# Patient Record
Sex: Male | Born: 1950 | Race: White | Hispanic: No | Marital: Married | Smoking: Current some day smoker
Health system: Southern US, Community
[De-identification: ages and names within clinical notes are randomized; demographics above are authoritative.]

## PROBLEM LIST (undated history)

## (undated) DIAGNOSIS — IMO0002 Reserved for concepts with insufficient information to code with codable children: Secondary | ICD-10-CM

## (undated) DIAGNOSIS — R7989 Other specified abnormal findings of blood chemistry: Secondary | ICD-10-CM

## (undated) DIAGNOSIS — M1990 Unspecified osteoarthritis, unspecified site: Secondary | ICD-10-CM

## (undated) DIAGNOSIS — F419 Anxiety disorder, unspecified: Secondary | ICD-10-CM

## (undated) DIAGNOSIS — K219 Gastro-esophageal reflux disease without esophagitis: Secondary | ICD-10-CM

## (undated) DIAGNOSIS — F329 Major depressive disorder, single episode, unspecified: Secondary | ICD-10-CM

## (undated) DIAGNOSIS — I10 Essential (primary) hypertension: Secondary | ICD-10-CM

## (undated) DIAGNOSIS — I499 Cardiac arrhythmia, unspecified: Secondary | ICD-10-CM

## (undated) DIAGNOSIS — F32A Depression, unspecified: Secondary | ICD-10-CM

## (undated) DIAGNOSIS — E119 Type 2 diabetes mellitus without complications: Secondary | ICD-10-CM

## (undated) DIAGNOSIS — E785 Hyperlipidemia, unspecified: Secondary | ICD-10-CM

## (undated) DIAGNOSIS — R945 Abnormal results of liver function studies: Secondary | ICD-10-CM

## (undated) HISTORY — DX: Unspecified osteoarthritis, unspecified site: M19.90

## (undated) HISTORY — DX: Major depressive disorder, single episode, unspecified: F32.9

## (undated) HISTORY — DX: Other specified abnormal findings of blood chemistry: R79.89

## (undated) HISTORY — DX: Gastro-esophageal reflux disease without esophagitis: K21.9

## (undated) HISTORY — DX: Hyperlipidemia, unspecified: E78.5

## (undated) HISTORY — PX: KNEE SURGERY: SHX244

## (undated) HISTORY — PX: EYE SURGERY: SHX253

## (undated) HISTORY — DX: Type 2 diabetes mellitus without complications: E11.9

## (undated) HISTORY — PX: COLONOSCOPY: SHX174

## (undated) HISTORY — DX: Essential (primary) hypertension: I10

## (undated) HISTORY — DX: Reserved for concepts with insufficient information to code with codable children: IMO0002

## (undated) HISTORY — PX: SQUAMOUS CELL CARCINOMA EXCISION: SHX2433

## (undated) HISTORY — DX: Depression, unspecified: F32.A

## (undated) HISTORY — PX: BLADDER SURGERY: SHX569

## (undated) HISTORY — DX: Abnormal results of liver function studies: R94.5

---

## 1991-07-16 HISTORY — PX: KNEE ARTHROSCOPY: SUR90

## 1997-07-15 HISTORY — PX: ACHILLES TENDON REPAIR: SUR1153

## 1997-12-03 ENCOUNTER — Emergency Department (HOSPITAL_COMMUNITY): Admission: EM | Admit: 1997-12-03 | Discharge: 1997-12-03 | Payer: Self-pay | Admitting: Emergency Medicine

## 2001-07-15 ENCOUNTER — Encounter: Payer: Self-pay | Admitting: Internal Medicine

## 2001-07-15 LAB — CONVERTED CEMR LAB

## 2004-06-18 ENCOUNTER — Ambulatory Visit: Payer: Self-pay | Admitting: Internal Medicine

## 2004-06-25 ENCOUNTER — Ambulatory Visit: Payer: Self-pay | Admitting: Internal Medicine

## 2004-10-18 ENCOUNTER — Ambulatory Visit: Payer: Self-pay | Admitting: Internal Medicine

## 2004-10-24 ENCOUNTER — Ambulatory Visit: Payer: Self-pay | Admitting: Internal Medicine

## 2005-03-14 ENCOUNTER — Ambulatory Visit: Payer: Self-pay | Admitting: Internal Medicine

## 2005-03-15 ENCOUNTER — Ambulatory Visit: Payer: Self-pay | Admitting: Internal Medicine

## 2005-08-07 ENCOUNTER — Ambulatory Visit: Payer: Self-pay | Admitting: Internal Medicine

## 2005-12-26 ENCOUNTER — Ambulatory Visit: Payer: Self-pay | Admitting: Internal Medicine

## 2006-02-10 ENCOUNTER — Ambulatory Visit: Payer: Self-pay | Admitting: Internal Medicine

## 2006-05-26 ENCOUNTER — Ambulatory Visit: Payer: Self-pay | Admitting: Internal Medicine

## 2006-08-11 ENCOUNTER — Ambulatory Visit: Payer: Self-pay | Admitting: Internal Medicine

## 2006-11-24 ENCOUNTER — Ambulatory Visit: Payer: Self-pay | Admitting: Internal Medicine

## 2007-02-02 ENCOUNTER — Encounter: Payer: Self-pay | Admitting: Internal Medicine

## 2007-02-02 DIAGNOSIS — R7309 Other abnormal glucose: Secondary | ICD-10-CM

## 2007-02-02 DIAGNOSIS — R740 Nonspecific elevation of levels of transaminase and lactic acid dehydrogenase [LDH]: Secondary | ICD-10-CM

## 2007-03-05 ENCOUNTER — Ambulatory Visit: Payer: Self-pay | Admitting: Gastroenterology

## 2007-03-18 ENCOUNTER — Ambulatory Visit: Payer: Self-pay | Admitting: Gastroenterology

## 2007-03-18 ENCOUNTER — Encounter: Payer: Self-pay | Admitting: Internal Medicine

## 2007-06-25 ENCOUNTER — Ambulatory Visit: Payer: Self-pay | Admitting: Internal Medicine

## 2007-06-25 DIAGNOSIS — J069 Acute upper respiratory infection, unspecified: Secondary | ICD-10-CM | POA: Insufficient documentation

## 2007-07-16 LAB — HM COLONOSCOPY: HM Colonoscopy: NORMAL

## 2007-12-22 ENCOUNTER — Encounter: Payer: Self-pay | Admitting: Internal Medicine

## 2007-12-28 ENCOUNTER — Telehealth: Payer: Self-pay | Admitting: Internal Medicine

## 2008-01-14 ENCOUNTER — Ambulatory Visit: Payer: Self-pay | Admitting: Internal Medicine

## 2008-01-14 LAB — CONVERTED CEMR LAB
Albumin: 4.2 g/dL (ref 3.5–5.2)
BUN: 12 mg/dL (ref 6–23)
Calcium: 9.6 mg/dL (ref 8.4–10.5)
Cholesterol: 222 mg/dL (ref 0–200)
Eosinophils Relative: 6.9 % — ABNORMAL HIGH (ref 0.0–5.0)
GFR calc Af Amer: 80 mL/min
Glucose, Bld: 158 mg/dL — ABNORMAL HIGH (ref 70–99)
HCT: 47.4 % (ref 39.0–52.0)
Hemoglobin: 16.3 g/dL (ref 13.0–17.0)
Monocytes Absolute: 0.5 10*3/uL (ref 0.1–1.0)
Monocytes Relative: 9.8 % (ref 3.0–12.0)
Neutro Abs: 2.9 10*3/uL (ref 1.4–7.7)
PSA: 0.45 ng/mL (ref 0.10–4.00)
RDW: 12.4 % (ref 11.5–14.6)
Specific Gravity, Urine: >=1.03
TSH: 3.53 microintl units/mL (ref 0.35–5.50)
Total CHOL/HDL Ratio: 3.1
Total Protein: 6.9 g/dL (ref 6.0–8.3)
Urobilinogen, UA: 0.2
WBC: 5.5 10*3/uL (ref 4.5–10.5)

## 2008-01-20 ENCOUNTER — Ambulatory Visit: Payer: Self-pay | Admitting: Internal Medicine

## 2008-01-20 DIAGNOSIS — I10 Essential (primary) hypertension: Secondary | ICD-10-CM | POA: Insufficient documentation

## 2008-01-20 DIAGNOSIS — F329 Major depressive disorder, single episode, unspecified: Secondary | ICD-10-CM | POA: Insufficient documentation

## 2008-01-22 ENCOUNTER — Telehealth: Payer: Self-pay | Admitting: Internal Medicine

## 2008-02-03 ENCOUNTER — Encounter: Admission: RE | Admit: 2008-02-03 | Discharge: 2008-04-05 | Payer: Self-pay | Admitting: Internal Medicine

## 2008-02-03 ENCOUNTER — Encounter: Payer: Self-pay | Admitting: Internal Medicine

## 2008-10-24 ENCOUNTER — Telehealth (INDEPENDENT_AMBULATORY_CARE_PROVIDER_SITE_OTHER): Payer: Self-pay | Admitting: *Deleted

## 2008-10-24 ENCOUNTER — Ambulatory Visit: Payer: Self-pay | Admitting: Family Medicine

## 2008-10-24 LAB — CONVERTED CEMR LAB
BUN: 14 mg/dL (ref 6–23)
Creatinine, Ser: 1.2 mg/dL (ref 0.4–1.5)
GFR calc non Af Amer: 65.79 mL/min (ref >60)
Hgb A1c MFr Bld: 6.4 % (ref 4.6–6.5)

## 2008-10-25 ENCOUNTER — Encounter (INDEPENDENT_AMBULATORY_CARE_PROVIDER_SITE_OTHER): Payer: Self-pay | Admitting: *Deleted

## 2008-10-26 ENCOUNTER — Telehealth (INDEPENDENT_AMBULATORY_CARE_PROVIDER_SITE_OTHER): Payer: Self-pay | Admitting: *Deleted

## 2008-12-05 ENCOUNTER — Telehealth: Payer: Self-pay | Admitting: Family Medicine

## 2008-12-23 ENCOUNTER — Telehealth: Payer: Self-pay | Admitting: Family Medicine

## 2008-12-23 ENCOUNTER — Ambulatory Visit: Payer: Self-pay | Admitting: Family Medicine

## 2009-02-06 ENCOUNTER — Ambulatory Visit: Payer: Self-pay | Admitting: Family Medicine

## 2009-02-06 DIAGNOSIS — Z87448 Personal history of other diseases of urinary system: Secondary | ICD-10-CM

## 2009-08-23 ENCOUNTER — Telehealth (INDEPENDENT_AMBULATORY_CARE_PROVIDER_SITE_OTHER): Payer: Self-pay | Admitting: *Deleted

## 2009-08-23 ENCOUNTER — Encounter (INDEPENDENT_AMBULATORY_CARE_PROVIDER_SITE_OTHER): Payer: Self-pay | Admitting: *Deleted

## 2009-10-02 ENCOUNTER — Telehealth (INDEPENDENT_AMBULATORY_CARE_PROVIDER_SITE_OTHER): Payer: Self-pay | Admitting: *Deleted

## 2009-10-03 ENCOUNTER — Ambulatory Visit: Payer: Self-pay | Admitting: Family Medicine

## 2009-10-03 ENCOUNTER — Encounter (INDEPENDENT_AMBULATORY_CARE_PROVIDER_SITE_OTHER): Payer: Self-pay | Admitting: *Deleted

## 2009-10-03 DIAGNOSIS — G5600 Carpal tunnel syndrome, unspecified upper limb: Secondary | ICD-10-CM

## 2009-10-03 LAB — CONVERTED CEMR LAB
Blood in Urine, dipstick: NEGATIVE
Nitrite: NEGATIVE
Specific Gravity, Urine: 1.025
WBC Urine, dipstick: NEGATIVE

## 2009-10-04 ENCOUNTER — Encounter: Payer: Self-pay | Admitting: Family Medicine

## 2009-10-04 ENCOUNTER — Encounter (INDEPENDENT_AMBULATORY_CARE_PROVIDER_SITE_OTHER): Payer: Self-pay | Admitting: *Deleted

## 2009-10-09 ENCOUNTER — Ambulatory Visit: Payer: Self-pay | Admitting: Family Medicine

## 2009-10-17 ENCOUNTER — Telehealth (INDEPENDENT_AMBULATORY_CARE_PROVIDER_SITE_OTHER): Payer: Self-pay | Admitting: *Deleted

## 2009-10-17 ENCOUNTER — Ambulatory Visit: Payer: Self-pay | Admitting: Family Medicine

## 2009-10-17 LAB — CONVERTED CEMR LAB
BUN: 14 mg/dL (ref 6–23)
Basophils Relative: 1.2 % (ref 0.0–3.0)
Calcium: 9 mg/dL (ref 8.4–10.5)
Cholesterol: 200 mg/dL (ref 0–200)
Creatinine, Ser: 1.2 mg/dL (ref 0.4–1.5)
Eosinophils Absolute: 0.4 10*3/uL (ref 0.0–0.7)
GFR calc non Af Amer: 65.57 mL/min (ref >60)
HCT: 45.7 % (ref 39.0–52.0)
HDL: 68.4 mg/dL (ref >39.00)
LDL Cholesterol: 118 mg/dL — ABNORMAL HIGH (ref 0–99)
Lymphs Abs: 2 10*3/uL (ref 0.7–4.0)
MCHC: 34.8 g/dL (ref 30.0–36.0)
MCV: 94.2 fL (ref 78.0–100.0)
Monocytes Absolute: 0.7 10*3/uL (ref 0.1–1.0)
Neutro Abs: 4 10*3/uL (ref 1.4–7.7)
Neutrophils Relative %: 55.8 % (ref 43.0–77.0)
Potassium: 4.1 meq/L (ref 3.5–5.1)
RBC: 4.84 M/uL (ref 4.22–5.81)
Total Bilirubin: 0.6 mg/dL (ref 0.3–1.2)
Triglycerides: 70 mg/dL (ref 0.0–149.0)
VLDL: 14 mg/dL (ref 0.0–40.0)

## 2009-10-20 ENCOUNTER — Ambulatory Visit: Payer: Self-pay | Admitting: Family Medicine

## 2009-10-20 ENCOUNTER — Telehealth (INDEPENDENT_AMBULATORY_CARE_PROVIDER_SITE_OTHER): Payer: Self-pay | Admitting: *Deleted

## 2009-10-20 DIAGNOSIS — E1165 Type 2 diabetes mellitus with hyperglycemia: Secondary | ICD-10-CM | POA: Insufficient documentation

## 2009-10-20 DIAGNOSIS — E1151 Type 2 diabetes mellitus with diabetic peripheral angiopathy without gangrene: Secondary | ICD-10-CM

## 2009-10-24 ENCOUNTER — Ambulatory Visit: Payer: Self-pay | Admitting: Family Medicine

## 2010-01-01 ENCOUNTER — Encounter: Payer: Self-pay | Admitting: Family Medicine

## 2010-01-23 ENCOUNTER — Ambulatory Visit: Payer: Self-pay | Admitting: Family Medicine

## 2010-06-22 ENCOUNTER — Ambulatory Visit: Payer: Self-pay | Admitting: Family Medicine

## 2010-07-11 ENCOUNTER — Encounter: Payer: Self-pay | Admitting: Family Medicine

## 2010-07-17 ENCOUNTER — Telehealth: Payer: Self-pay | Admitting: Family Medicine

## 2010-08-14 NOTE — Letter (Signed)
Summary: [facility]: Immunoassay Fecal Occult Blood (iFOB) Order Form  [facility] [place]  [address] [place], [place] [address]   Phone: [phone]  Fax: [phone]       Lab: Immunoassay Fecal Occult Blood (iFOB) Order Form   October 03, 2009 MRN: [id number]   Charles Trujillo 06/15/1951   Physicican Name:____Yvonne,Lowne,DO_____________________  Diagnosis Code:_______V76.51___________________      [name] CMA

## 2010-08-14 NOTE — Progress Notes (Signed)
Summary: Refill request   Phone Note Refill Request Message from:  Pharmacy on August 23, 2009 10:13 AM  Refills Requested: Medication #1:  CYMBALTA 60 MG CPEP 1 by mouth once daily [BMN]   Dosage confirmed as above?Dosage Confirmed  Medication #2:  ZESTORETIC 10-12.5 MG TABS 1 by mouth once daily   Dosage confirmed as above?Dosage Confirmed   Brand Name Necessary? No Refill request to [facility] , [facility]  Initial call taken by: Michaelle Copas,  August 23, 2009 10:14 AM    New/Updated Medications: CYMBALTA 60 MG CPEP (DULOXETINE HCL) 1 by mouth once daily. NEEDS OFFICE VISIT. [BMN] ZESTORETIC 10-12.5 MG TABS (LISINOPRIL-HYDROCHLOROTHIAZIDE) 1 by mouth once daily. NEEDS OFFICE VISIT. Prescriptions: ZESTORETIC 10-12.5 MG TABS (LISINOPRIL-HYDROCHLOROTHIAZIDE) 1 by mouth once daily. NEEDS OFFICE VISIT.  #30 x 0   Entered by:   [name] CMA   Authorized by:   Loreen Freud DO   Signed by:   [name] CMA on 08/23/2009   Method used:   Electronically to        [facility]* (retail)       [address]       [place], [place]  [address]       Ph: [phone]       Fax: [phone]   RxID:   [id number] CYMBALTA 60 MG CPEP (DULOXETINE HCL) 1 by mouth once daily. NEEDS OFFICE VISIT. Brand medically necessary #30 x 0   Entered by:   [name] CMA   Authorized by:   Loreen Freud DO   Signed by:   [name] CMA on 08/23/2009   Method used:   Electronically to        [facility]* (retail)       [address]       [place], [place]  [address]       Ph: [phone]       Fax: [phone]   RxID:   [id number]

## 2010-08-14 NOTE — Letter (Signed)
Summary: Primary Care Appointment Letter  [facility] [place]  [address] [place], [place] [address]   Phone: [phone]  Fax: [phone]    08/23/2009 MRN: [id number]  [facility] [address] [place], [place]  [address]  Dear Mr. Mood,   Your Primary Care Physician Loreen Freud DO has indicated that:    ____x___it is time to schedule an appointment.    _______you missed your appointment on______ and need to call and          reschedule.    _______you need to have lab work done.    _______you need to schedule an appointment discuss lab or test results.    _______you need to call to reschedule your appointment that is                       scheduled on _________.     Please call our office as soon as possible. Our phone number is [phone]          _________. Please press option 1. Our office is open 8a-12noon and 1p-5p, Monday through Friday.     Thank you,    [facility]

## 2010-08-14 NOTE — Progress Notes (Signed)
Summary: Lab Results   Phone Note Outgoing Call   Reason for Call: Discuss lab or test results Summary of Call: Pt blood sugar and a1c elevated----- pt is diabetic---needs ov to discuss glucometer and diet etc   Signed by Loreen Freud DO on 10/20/2009 at 3:37 PM  LMTCB Harold Barban  October 20, 2009 3:50 PM Initial call taken by: Harold Barban,  October 20, 2009 3:50 PM  Follow-up for Phone Call        Patient is coming in for an office visit on 4.12.11  Follow-up by: Harold Barban,  October 23, 2009 11:00 AM

## 2010-08-14 NOTE — Progress Notes (Signed)
Summary: Lab results  Phone Note Outgoing Call   Call placed by: [name] CMA,  October 17, 2009 4:39 PM Summary of Call: Regarding lab results, LMTCB:  glucose elevated---- recheck fasting with hgba1c 790.6 LDL goal ideally < 100----diet and exercise will help this---- recheck labs 3 months  790.6  272.4  lipid, hep, bmp, hgba1c Signed by Loreen Freud DO on 10/17/2009 at 3:25 PM  Follow-up for Phone Call        Patient called back and information was given. Patient is coming in 4.8.11 for his fasting HgbA1C and will call back later to schedule the 3 month lab work.  Follow-up by: Harold Barban,  October 18, 2009 10:58 AM

## 2010-08-14 NOTE — Assessment & Plan Note (Signed)
Summary: CPX,RENEW MEDS,UMR INS/RH.....   Vital Signs:  Patient profile:   60 year old male Weight:      206 pounds Pulse rate:   87 / minute Pulse rhythm:   regular BP sitting:   122 / 78  (left arm) Cuff size:   regular  Vitals Entered By: [name] CMA (October 03, 2009 8:25 AM) CC: Charles Trujillo here for CPX, everything going well.   History of Present Illness: Charles Trujillo here for cpe and labs.   No complaints.    Preventive Screening-Counseling & Management  Alcohol-Tobacco     Alcohol drinks/day: 2     Alcohol type: beer     Smoking Status: current     Smoking Cessation Counseling: yes     Year Quit: 1990--cigarettes      Cigars/week: 3  Caffeine-Diet-Exercise     Caffeine use/day: 4     Does Patient Exercise: yes     Type of exercise: walking  Hep-HIV-STD-Contraception     Dental Visit-last 6 months yes     Dental Care Counseling: not indicated; dental care within six months      Sexual History:  currently monogamous and married.    Current Medications (verified): 1)  Viagra 100 Mg  Tabs (Sildenafil Citrate) .... One Prn 2)  Cymbalta 60 Mg Cpep (Duloxetine Hcl) .Marland Kitchen.. 1 By Mouth Once Daily. Needs Office Visit. 3)  Zestoretic 10-12.5 Mg Tabs (Lisinopril-Hydrochlorothiazide) .Marland Kitchen.. 1 By Mouth Once Daily. Needs Office Visit. 4)  Abilify 2 Mg Tabs (Aripiprazole) .Marland Kitchen.. 1 By Mouth Once Daily  Allergies: 1)  Codeine Phosphate (Codeine Phosphate)  Past History:  Past Medical History: Last updated: 01/20/2008 increased LFT's, previously evaluated by Dr. 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 mother DM, HTN father-stroke 24 yo  Social History: Reviewed history from 01/20/2008 and no changes required. Retired Married Former Smoker Regular exercise-no Does Patient Exercise:  yes Caffeine use/day:  4 Smoking Status:  current Dental Care w/in 6 mos.:  yes Sexual History:  currently monogamous, married  Review of Systems      See HPI General:  Denies chills, fatigue, fever, loss of appetite, malaise, sleep disorder, sweats, weakness, and weight loss. Eyes:  Denies blurring, discharge, double vision, eye irritation, eye pain, halos, itching, light sensitivity, red eye, vision loss-1 eye, and vision loss-both eyes; optho--due. ENT:  Denies decreased hearing, difficulty swallowing, ear discharge, earache, hoarseness, nasal congestion, nosebleeds, postnasal drainage, ringing in ears, sinus pressure, and sore throat. CV:  Denies bluish discoloration of lips or nails, chest pain or discomfort, difficulty breathing at night, difficulty breathing while lying down, fainting, fatigue, leg cramps with exertion, lightheadness, near fainting, palpitations, shortness of breath with exertion, swelling of feet, swelling of hands, and weight gain. Resp:  Denies chest discomfort, chest pain with inspiration, cough, coughing up blood, excessive snoring, hypersomnolence, morning headaches, pleuritic, shortness of breath, sputum productive, and wheezing. GI:  Denies abdominal pain, bloody stools, change in bowel habits, constipation, dark tarry stools, diarrhea, excessive appetite, gas, hemorrhoids, indigestion, loss of appetite, nausea,  vomiting, vomiting blood, and yellowish skin color. GU:  Denies decreased libido, discharge, dysuria, erectile dysfunction, genital sores, hematuria, incontinence, nocturia,  urinary frequency, and urinary hesitancy. MS:  Denies joint pain, joint redness, joint swelling, loss of strength, low back pain, mid back pain, muscle aches, muscle , cramps, muscle weakness, stiffness, and thoracic pain; numbness in both hands at night. Derm:  Denies changes in color of skin, changes in nail beds, dryness, excessive perspiration, flushing, hair loss, insect bite(s), itching, lesion(s), poor wound healing, and rash. Neuro:  Complains of numbness; denies brief paralysis, difficulty with concentration, disturbances in coordination, falling down, headaches, inability to speak, memory loss, poor balance, seizures, sensation of room spinning, tingling, tremors, visual disturbances, and weakness. Psych:  Denies alternate hallucination ( auditory/visual), anxiety, depression, easily angered, easily tearful, irritability, mental problems, panic attacks, sense of great danger, suicidal thoughts/plans, thoughts of violence, unusual visions or sounds, and thoughts /plans of harming others. Endo:  Denies cold intolerance, excessive hunger, excessive thirst, excessive urination, heat intolerance, polyuria, and weight change. Heme:  Denies abnormal bruising, bleeding, enlarge lymph nodes, fevers, pallor, and skin discoloration. Allergy:  Denies hives or rash, itching eyes, persistent infections, seasonal allergies, and sneezing.  Physical Exam  General:  Well-developed,well-nourished,in no acute distress; alert,appropriate and cooperative throughout examination Head:  Normocephalic and atraumatic without obvious abnormalities. No apparent alopecia or balding. Eyes:  vision grossly intact, pupils equal, and pupils round.   Ears:  External ear exam shows no significant lesions or deformities.  Otoscopic examination reveals clear canals, tympanic membranes are intact bilaterally without bulging, retraction, inflammation or discharge. Hearing is grossly normal bilaterally. Nose:  External nasal  examination shows no deformity or inflammation. Nasal mucosa are pink and moist without lesions or exudates. Mouth:  Oral mucosa and oropharynx without lesions or exudates.  Teeth in good repair. Neck:  No deformities, masses, or tenderness noted. Chest Wall:  No deformities, masses, tenderness or gynecomastia noted. Lungs:  Normal respiratory effort, chest expands symmetrically. Lungs are clear to auscultation, no crackles or wheezes. Heart:  normal rate and no murmur.   Abdomen:  Bowel sounds positive,abdomen soft and non-tender without masses, organomegaly or hernias noted. Rectal:  No external abnormalities noted. Normal sphincter tone. No rectal masses or tenderness. heme negative brown stool Genitalia:  Testes bilaterally descended without nodularity, tenderness or masses. No scrotal masses or lesions. No penis lesions or urethral discharge. Prostate:  Prostate gland firm and smooth, no enlargement, nodularity, tenderness, mass, asymmetry or induration. Msk:  normal ROM, no joint tenderness, no joint swelling, no joint warmth, no redness over joints, no joint deformities, no joint instability, and no crepitation.   Pulses:  R posterior tibial normal, R dorsalis pedis normal, R carotid normal, L posterior tibial normal, L dorsalis pedis normal, and L carotid normal.   Extremities:  No clubbing, cyanosis, edema, or deformity noted with normal full range of motion of all joints.   Neurologic:  No cranial nerve deficits noted. Station and gait are normal. Plantar reflexes are down-going bilaterally. DTRs are symmetrical throughout. Sensory, motor and coordinative functions appear intact. + numbness in L hand with flexion of wrist Skin:  Intact without suspicious lesions or rashes mult SK Cervical Nodes:  No lymphadenopathy noted Psych:  Cognition and judgment appear intact. Alert and cooperative with normal attention span and concentration. No apparent delusions, illusions,  hallucinations   Impression & Recommendations:  Problem # 1:  PHYSICAL EXAMINATION (ICD-V70.0)  schedule labs ghm utd d/w Charles Trujillo shingels vaccine Reviewed  preventive care protocols, scheduled due services, and updated immunizations.  Orders: UA Dipstick w/o Micro (manual) (57322) EKG w/ Interpretation (93000)  Problem # 2:  HYPERTENSION (ICD-401.9) Assessment: Unchanged  His updated medication list for this problem includes:    Zestoretic 10-12.5 Mg Tabs (Lisinopril-hydrochlorothiazide) .Marland Kitchen... 1 by mouth once daily. needs office visit.  BP today: 122/78 Prior BP: 130/80 (02/06/2009)  Labs Reviewed: K+: 4.6 (10/24/2008) Creat: : 1.2 (10/24/2008)   Chol: 222 (01/14/2008)   HDL: 71.6 (01/14/2008)   LDL: DEL (01/14/2008)   TG: 74 (01/14/2008)  Orders: UA Dipstick w/o Micro (manual) (02542) EKG w/ Interpretation (93000)  Problem # 3:  HYPERGLYCEMIA (ICD-790.29)  Orders: UA Dipstick w/o Micro (manual) (70623) EKG w/ Interpretation (93000)  Labs Reviewed: Creat: 1.2 (10/24/2008)     Problem # 4:  CARPAL TUNNEL SYNDROME, BILATERAL (ICD-354.0)  Orders: Ankle / Wrist Splint (A4570) UA Dipstick w/o Micro (manual) (76283) EKG w/ Interpretation (93000)  Labs Reviewed: TSH: 3.53 (01/14/2008)   HgBA1c: 6.4 (10/24/2008)  Complete Medication List: 1)  Viagra 100 Mg Tabs (Sildenafil citrate) .... One prn 2)  Cymbalta 60 Mg Cpep (Duloxetine hcl) .Marland Kitchen.. 1 by mouth once daily. needs office visit. 3)  Zestoretic 10-12.5 Mg Tabs (Lisinopril-hydrochlorothiazide) .Marland Kitchen.. 1 by mouth once daily. needs office visit. 4)  Abilify 2 Mg Tabs (Aripiprazole) .Marland Kitchen.. 1 by mouth once daily  Patient Instructions: 1)  V70.0 , 790.6  cbcd, bmp, hep,lipid, psa, tsh, hgba1c iPrescriptions: VIAGRA 100 MG  TABS (SILDENAFIL CITRATE) one prn  #10 x 6   Entered and Authorized by:   Loreen Freud DO   Signed by:   Loreen Freud DO on 10/03/2009   Method used:   Electronically to        [facility]*  (retail)       [address]       [place], [place]  [address]       Ph: [phone]       Fax: [phone]   RxID:   [id number] ABILIFY 2 MG TABS (ARIPIPRAZOLE) 1 by mouth once daily  #30 x 5   Entered and Authorized by:   Loreen Freud DO   Signed by:   Loreen Freud DO on 10/03/2009   Method used:   Electronically to        [facility]* (retail)       [address]       [place], [place]  [address]       Ph: [phone]       Fax: [phone]   RxID:   [id number] ZESTORETIC 10-12.5 MG TABS (LISINOPRIL-HYDROCHLOROTHIAZIDE) 1 by mouth once daily. NEEDS OFFICE VISIT.  #30 x 5   Entered and Authorized by:   Loreen Freud DO   Signed by:   Loreen Freud DO on 10/03/2009   Method used:   Electronically to        [facility]* (retail)       [address]       [place], [place]  [address]       Ph: [phone]       Fax: [phone]   RxID:   [id number] CYMBALTA 60 MG CPEP (DULOXETINE HCL) 1 by mouth once daily. NEEDS OFFICE VISIT. Brand medically necessary #30 x 5   Entered and Authorized by:   Loreen Freud DO   Signed by:   Loreen Freud DO on 10/03/2009   Method used:   Electronically to  [facility]* (retail)       [address]       [place], [place]  [address]       Ph: [phone]       Fax: [phone]   RxID:   [id number]    EKG  Procedure date:  10/03/2009  Findings:      Normal sinus rhythm with rate of:  98 BPM     Immunization History:  Tetanus/Td Immunization History:    Tetanus/Td:  Tdap (06/21/2009)  Influenza Immunization History:    Influenza:  Fluvax 3+ (05/31/2009)    Laboratory Results   Urine Tests    Routine Urinalysis   Color: yellow Appearance: Clear Glucose: negative   (Normal Range: Negative) Bilirubin: negative   (Normal Range: Negative) Ketone: negative   (Normal Range: Negative) Spec. Gravity: 1.025   (Normal Range: 1.003-1.035) Blood:  negative   (Normal Range: Negative) pH: 6.0   (Normal Range: 5.0-8.0) Protein: negative   (Normal Range: Negative) Urobilinogen: 0.2   (Normal Range: 0-1) Nitrite: negative   (Normal Range: Negative) Leukocyte Esterace: negative   (Normal Range: Negative)    Comments: [name] CMA  October 03, 2009 9:27 AM

## 2010-08-14 NOTE — Letter (Signed)
Summary: [name]: Immunoassay Fecal Occult Blood (iFOB) Order Form  [facility] [place]  [address] [place], [place] [address]   Phone: [phone]  Fax: [phone]      [facility]: Immunoassay Fecal Occult Blood (iFOB) Order Form   October 04, 2009 MRN: [id number]   Charles Trujillo 1951-02-03   Physicican Name:____Yvonne Lowne,DO_____________________  Diagnosis Code:________v76.51__________________      [name] CMA

## 2010-08-14 NOTE — Progress Notes (Signed)
Summary: Refill Request  Phone Note Refill Request Message from:  Pharmacy on [facility] Fax #: [phone]  Refills Requested: Medication #1:  ZESTORETIC 10-12.5 MG TABS 1 by mouth once daily. NEEDS OFFICE VISIT.   Dosage confirmed as above?Dosage Confirmed   Brand Name Necessary? No   Supply Requested: 1 month   Last Refilled: 08/23/2009  Medication #2:  CYMBALTA 60 MG CPEP 1 by mouth once daily. NEEDS OFFICE VISIT. [BMN]   Dosage confirmed as above?Dosage Confirmed   Supply Requested: 1 month   Last Refilled: 08/23/2009  Medication #3:  ABILIFY 2 MG TABS 1 by mouth once daily.   Dosage confirmed as above?Dosage Confirmed   Supply Requested: 1 month   Last Refilled: 08/11/2009 Next Appointment Scheduled: 3.22.11 Initial call taken by: Harold Barban,  October 02, 2009 10:00 AM  Follow-up for Phone Call        Pts last ov was 02/06/09.  Has an ov tomorrow 3.22.10. [name] CMA  October 02, 2009 10:10 AM     Prescriptions: ABILIFY 2 MG TABS (ARIPIPRAZOLE) 1 by mouth once daily  #30 x 0   Entered by:   [name] CMA   Authorized by:   Loreen Freud DO   Signed by:   [name] CMA on 10/02/2009   Method used:   Electronically to        [facility]* (retail)       [address]       [place], [place]  [address]       Ph: [phone]       Fax: [phone]   RxID:   [id number] ZESTORETIC 10-12.5 MG TABS (LISINOPRIL-HYDROCHLOROTHIAZIDE) 1 by mouth once daily. NEEDS OFFICE VISIT.  #30 x 0   Entered by:   [name] CMA   Authorized by:   Loreen Freud DO   Signed by:   [name] CMA on 10/02/2009   Method used:   Electronically to        [facility]* (retail)       [address]       [place], [place]  [address]       Ph: [phone]       Fax: [phone]   RxID:   [id number] CYMBALTA 60 MG CPEP (DULOXETINE HCL) 1 by mouth once daily. NEEDS OFFICE VISIT. Brand medically necessary #30 x 0   Entered by:    [name] CMA   Authorized by:   Loreen Freud DO   Signed by:   [name] CMA on 10/02/2009   Method used:   Electronically to        [facility]* (retail)       [address]       [place], [place]  [address]       Ph: [phone]       Fax: [phone]   RxID:   [id number]

## 2010-08-14 NOTE — Assessment & Plan Note (Signed)
Summary: med refills, arth in knees///sph   Vital Signs:  Patient profile:   60 year old male Height:      67.25 inches Weight:      212.4 pounds BMI:     33.14 BP sitting:   126 / 74  (left arm) Cuff size:   large  Vitals Entered By: Almeta Monas CMA Duncan Dull) (June 22, 2010 3:52 PM) CC: Med refill-- C/o knee pain from Arthritis   History of Present Illness: Pt here for med refill but c/o both knees hurting with arthritis---dx several years ago by Dr Lessie Dings.     Hypertension follow-up      This is a 60 year old man who presents for Hypertension follow-up.  The patient denies lightheadedness, urinary frequency, headaches, edema, impotence, rash, and fatigue.  The patient denies the following associated symptoms: chest pain, chest pressure, exercise intolerance, dyspnea, palpitations, syncope, leg edema, and pedal edema.  Compliance with medications (by patient report) has been near 100%.  The patient reports that dietary compliance has been good.  The patient reports exercising occasionally.  Adjunctive measures currently used by the patient include salt restriction.    Type 1 diabetes mellitus follow-up      The patient is also here for Type 2 diabetes mellitus follow-up.  The patient denies polyuria, polydipsia, blurred vision, self managed hypoglycemia, hypoglycemia requiring help, weight loss, weight gain, and numbness of extremities.  The patient denies the following symptoms: neuropathic pain, chest pain, vomiting, orthostatic symptoms, poor wound healing, intermittent claudication, vision loss, and foot ulcer.  Since the last visit the patient reports good dietary compliance, not exercising regularly, and not monitoring blood glucose.  Since the last visit, the patient reports having had eye care by an ophthalmologist.    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 One Prn 2)  Cymbalta 60 Mg Cpep (Duloxetine Hcl) .Marland Kitchen.. 1 By Mouth Once Daily. Needs Office Visit. 3)  Zestoretic 10-12.5 Mg Tabs (Lisinopril-Hydrochlorothiazide) .Marland Kitchen.. 1 By Mouth Once Daily. Needs Office Visit. 4)  Abilify 2 Mg Tabs (Aripiprazole) .Marland Kitchen.. 1 By Mouth Once Daily 5)  Onetouch Delica Lancets  Misc (Lancets) .... Accu Check Two Times A Day 6)  One Touch Ultramini Strips .... Accu Check Two Times A Day  Current Medications (verified): 1)  Viagra 100 Mg  Tabs (Sildenafil Citrate) .... One Prn 2)  Cymbalta 60 Mg Cpep (Duloxetine Hcl) .Marland Kitchen.. 1 By Mouth Once Daily. Needs Office Visit. 3)  Zestoretic 10-12.5 Mg Tabs (Lisinopril-Hydrochlorothiazide) .Marland Kitchen.. 1 By Mouth Once Daily. Needs Office Visit. 4)  Onetouch Delica Lancets  Misc (Lancets) .... Accu Check Two Times A Day 5)  One Touch Ultramini Strips .... Accu Check Two Times A Day 6)  Celebrex 200 Mg Caps (Celecoxib) .Marland Kitchen.. 1 By Mouth Once Daily  Allergies (verified): 1)  Codeine Phosphate (Codeine Phosphate)  Past History:  Past Medical History: Last updated: 01/20/2008 increased LFT's, previously evaluated by Dr. 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 mother DM, HTN father-stroke 33 yo  Social History: Reviewed history from 01/20/2008 and no changes required. Retired Married Former Smoker Regular exercise-no  Review of Systems      See HPI  Physical Exam  General:  Well-developed,well-nourished,in no acute distress; alert,appropriate and cooperative throughout examination Lungs:  Normal respiratory effort, chest expands symmetrically. Lungs are clear to auscultation, no crackles or wheezes. Heart:  normal rate and no murmur.   Extremities:  No clubbing, cyanosis, edema, or deformity noted with normal full range of motion of all joints.   Psych:  Oriented X3 and normally interactive.    Diabetes Management Exam:    Foot Exam (with socks and/or shoes not present):       Sensory-Pinprick/Light touch:          Left medial foot (L-4): normal          Left dorsal foot (L-5): normal          Left lateral foot (S-1): normal          Right medial foot (L-4): normal          Right dorsal foot (L-5): normal          Right lateral foot (S-1): normal       Sensory-Monofilament:          Left foot: normal          Right foot: normal       Inspection:          Left foot: normal          Right foot: normal       Nails:          Left foot: normal          Right foot: normal    Eye Exam:       Eye Exam done elsewhere   Impression & Recommendations:  Problem # 1:  DIABETES MELLITUS, TYPE II (ICD-250.00)  His updated medication list for this problem includes:    Zestoretic 10-12.5 Mg Tabs (Lisinopril-hydrochlorothiazide) .Marland Kitchen... 1 by mouth once daily. needs office visit.  Orders: Ophthalmology Referral (Ophthalmology)  Labs Reviewed: Creat: 1.2 (10/17/2009)    Reviewed HgBA1c results: 6.5 (01/23/2010)  6.5 (10/20/2009)  Problem # 2:  HYPERTENSION (ICD-401.9)  His updated medication list for this problem  includes:    Zestoretic 10-12.5 Mg Tabs (Lisinopril-hydrochlorothiazide) .Marland Kitchen... 1 by mouth once daily. needs office visit.  BP today: 126/74 Prior BP: 122/88 (10/24/2009)  Labs Reviewed: K+: 4.1 (10/17/2009) Creat: : 1.2 (10/17/2009)   Chol: 200 (10/17/2009)   HDL: 68.40 (10/17/2009)   LDL: 118 (10/17/2009)   TG: 70.0 (10/17/2009)  Problem # 3:  DEPRESSION (ICD-311)  His updated medication list for this problem includes:    Cymbalta 60 Mg Cpep (Duloxetine hcl) .Marland Kitchen... 1 by mouth once daily. needs office visit.  Complete Medication List: 1)  Viagra 100 Mg Tabs (Sildenafil citrate) .... One prn 2)  Cymbalta 60 Mg Cpep (Duloxetine hcl) .Marland Kitchen.. 1 by mouth once daily. needs office visit. 3)  Zestoretic 10-12.5 Mg Tabs (Lisinopril-hydrochlorothiazide) .Marland Kitchen.. 1 by mouth once daily. needs office visit. 4)  Onetouch  Delica Lancets Misc (Lancets) .... Accu check two times a day 5)  One Touch Ultramini Strips  .... Accu check two times a day 6)  Celebrex 200 Mg Caps (Celecoxib) .Marland Kitchen.. 1 by mouth once daily  Patient Instructions: 1)  250.00  401.9   hep, lipid, bmp, hgba1c , microalbumin --- fasting labs Prescriptions: ZESTORETIC 10-12.5 MG TABS (LISINOPRIL-HYDROCHLOROTHIAZIDE) 1 by mouth once daily. NEEDS OFFICE VISIT.  #30 x 5   Entered and Authorized by:   Loreen Freud DO   Signed by:   Loreen Freud DO on 06/22/2010   Method used:   Electronically to        [facility]* (retail)       [address]       [place], [place]  [address]       Ph: [phone]       Fax: [phone]   RxID:   [id number] CYMBALTA 60 MG CPEP (DULOXETINE HCL) 1 by mouth once daily. NEEDS OFFICE VISIT. Brand medically necessary #30 x 5   Entered and Authorized by:   Loreen Freud DO   Signed by:   Loreen Freud DO on 06/22/2010   Method used:   Electronically to        [facility]* (retail)       [address]       [place], [place]  [id number]       Ph: [phone]       Fax:  [phone]   RxID:   [id number] CELEBREX 200 MG CAPS (CELECOXIB) 1 by mouth once daily  #30 x 5   Entered and Authorized by:   Loreen Freud DO   Signed by:   Loreen Freud DO on 06/22/2010   Method used:   Electronically to        [facility]* (retail)       [address]       [place], [place]  [address]       Ph: [phone]       Fax: [phone]   RxID:   [id number]    Orders Added: 1)  Ophthalmology Referral [Ophthalmology] 2)  Est. Patient Level III [[address]]

## 2010-08-14 NOTE — Assessment & Plan Note (Signed)
Summary: discuss diabetes//lch   Vital Signs:  Patient profile:   60 year old male Weight:      208 pounds Pulse rate:   86 / minute Pulse rhythm:   regular BP sitting:   122 / 88  (left arm) Cuff size:   regular  Vitals Entered By: [name] CMA (October 24, 2009 8:46 AM) CC: Pt here to discuss DM.   History of Present Illness: Pt here to go over labs and get a glucometer.    Allergies: 1)  Codeine Phosphate (Codeine Phosphate)  Physical Exam  General:  Well-developed,well-nourished,in no acute distress; alert,appropriate and cooperative throughout examination Psych:  Cognition and judgment appear intact. Alert and cooperative with normal attention span and concentration. No apparent delusions, illusions, hallucinations   Impression & Recommendations:  Problem # 1:  DIABETES MELLITUS, TYPE II (ICD-250.00) discussed with pt diet  and glucometer---check BS two times a day  pt ed given to pt  drop off or fax in 2 weeks His updated medication list for this problem includes:    Zestoretic 10-12.5 Mg Tabs (Lisinopril-hydrochlorothiazide) .Marland Kitchen... 1 by mouth once daily. needs office visit.  Labs Reviewed: Creat: 1.2 (10/17/2009)    Reviewed HgBA1c results: 6.5 (10/20/2009)  6.4 (10/24/2008)  Complete Medication List: 1)  Viagra 100 Mg Tabs (Sildenafil citrate) .... One prn 2)  Cymbalta 60 Mg Cpep (Duloxetine hcl) .Marland Kitchen.. 1 by mouth once daily. needs office visit. 3)  Zestoretic 10-12.5 Mg Tabs (Lisinopril-hydrochlorothiazide) .Marland Kitchen.. 1 by mouth once daily. needs office visit. 4)  Abilify 2 Mg Tabs (Aripiprazole) .Marland Kitchen.. 1 by mouth once daily 5)  Onetouch Delica Lancets Misc (Lancets) .... Accu check two times a day 6)  One Touch Ultramini Strips  .... Accu check two times a day  Patient Instructions: 1)  recheck labs 3 months Prescriptions: ONE TOUCH ULTRAMINI STRIPS accu check two times a day  #60 x 11   Entered and Authorized by:   Loreen Freud DO   Signed by:   Loreen Freud DO on 10/24/2009   Method used:   Faxed to ...       [facility]* (retail)       [address]       [place], [place]  [address]       Ph: [phone]       Fax: [phone]   RxID:   [id number] Dola Argyle LANCETS  MISC (LANCETS) ACCU CHECK two times a day  #60 x 11   Entered and Authorized by:   Loreen Freud DO   Signed by:   Loreen Freud DO on 10/24/2009   Method used:   Electronically to        [facility]* (retail)       [address]       [place], [place]  [id number]       Ph: [phone]       Fax: [phone]   RxID:   [id number]

## 2010-08-14 NOTE — Medication Information (Signed)
Summary: Letter Regarding Abilify & Diabetes/[facility]  Letter Regarding Abilify & Diabetes/[facility]   Imported By: Lanelle Bal 01/05/2010 13:31:30  _____________________________________________________________________  External Attachment:    Type:   Image     Comment:   External Document

## 2010-08-16 NOTE — Progress Notes (Signed)
Summary: -add labs  Phone Note Call from Patient Call back at [phone]   Caller: Patient Summary of Call: Pt requesting that testosterone level be added to current lab order. Pls advise............Marland KitchenFelecia Deloach CMA  July 17, 2010 12:06 PM   Left message to call office to see if Pt is currently experiencing some issues............Marland KitchenFelecia Deloach CMA  July 17, 2010 12:07 PM   Pt states that he has had some decrease in livido and would like to have testosterone level checked. Pls advise..........Marland KitchenFelecia Deloach CMA  July 17, 2010 2:31 PM   Follow-up for Phone Call        V13.09  free testosterone level Follow-up by: Loreen Freud DO,  July 17, 2010 2:39 PM  Additional Follow-up for Phone Call Additional follow up Details #1::        Left Pt detail message labs have been ok, Pt to call to schedule labs...........Marland KitchenFelecia Deloach CMA  July 17, 2010 4:27 PM

## 2010-08-16 NOTE — Letter (Signed)
Summary: [facility]   Imported By: Lanelle Bal 07/27/2010 08:42:48  _____________________________________________________________________  External Attachment:    Type:   Image     Comment:   External Document

## 2010-08-20 ENCOUNTER — Encounter (INDEPENDENT_AMBULATORY_CARE_PROVIDER_SITE_OTHER): Payer: Self-pay | Admitting: *Deleted

## 2010-08-20 ENCOUNTER — Other Ambulatory Visit: Payer: Self-pay | Admitting: Family Medicine

## 2010-08-20 ENCOUNTER — Other Ambulatory Visit (INDEPENDENT_AMBULATORY_CARE_PROVIDER_SITE_OTHER): Payer: 59

## 2010-08-20 ENCOUNTER — Encounter: Payer: Self-pay | Admitting: Family Medicine

## 2010-08-20 DIAGNOSIS — Z87448 Personal history of other diseases of urinary system: Secondary | ICD-10-CM

## 2010-08-20 DIAGNOSIS — E785 Hyperlipidemia, unspecified: Secondary | ICD-10-CM

## 2010-08-20 DIAGNOSIS — I10 Essential (primary) hypertension: Secondary | ICD-10-CM

## 2010-08-20 DIAGNOSIS — E119 Type 2 diabetes mellitus without complications: Secondary | ICD-10-CM

## 2010-08-20 LAB — LIPID PANEL
Cholesterol: 205 mg/dL — ABNORMAL HIGH (ref 0–200)
VLDL: 13 mg/dL (ref 0.0–40.0)

## 2010-08-20 LAB — BASIC METABOLIC PANEL
BUN: 15 mg/dL (ref 6–23)
CO2: 27 mEq/L (ref 19–32)
Chloride: 100 mEq/L (ref 96–112)
Creatinine, Ser: 1.2 mg/dL (ref 0.4–1.5)
Glucose, Bld: 160 mg/dL — ABNORMAL HIGH (ref 70–99)
Potassium: 4.7 mEq/L (ref 3.5–5.1)

## 2010-08-20 LAB — HEPATIC FUNCTION PANEL
AST: 45 U/L — ABNORMAL HIGH (ref 0–37)
Albumin: 4.2 g/dL (ref 3.5–5.2)
Total Protein: 6.5 g/dL (ref 6.0–8.3)

## 2010-08-20 LAB — HEMOGLOBIN A1C: Hgb A1c MFr Bld: 6.6 % — ABNORMAL HIGH (ref 4.6–6.5)

## 2010-08-20 LAB — LDL CHOLESTEROL, DIRECT: Direct LDL: 121.8 mg/dL

## 2010-08-22 ENCOUNTER — Telehealth (INDEPENDENT_AMBULATORY_CARE_PROVIDER_SITE_OTHER): Payer: Self-pay | Admitting: *Deleted

## 2010-08-30 NOTE — Progress Notes (Signed)
Summary: results--lmom-- 2/8  Phone Note Outgoing Call   Call placed by: Almeta Monas CMA Duncan Dull),  August 22, 2010 9:30 AM Call placed to: Patient Details for Reason: hgba1c creeping up---- if pt has been watching diet closely--- start Glucophage xr 500mg   #30  1 by mouth qpm ,  2 refills      250.00  bmp, hgba1c   272.4  lipid hep  Summary of Call: Left message to call back..... Almeta Monas CMA Duncan Dull)  August 22, 2010 9:30 AM   Follow-up for Phone Call        spoke with patient and he is aware of results. We discuss diet and exercise. Rx sent to [facility] per pt request... Follow-up by: Almeta Monas CMA Duncan Dull),  August 23, 2010 10:11 AM    New/Updated Medications: GLUCOPHAGE XR 500 MG XR24H-TAB (METFORMIN HCL) 1 by mouth every evening Prescriptions: GLUCOPHAGE XR 500 MG XR24H-TAB (METFORMIN HCL) 1 by mouth every evening  #30 x 2   Entered by:   Almeta Monas CMA (AAMA)   Authorized by:   Loreen Freud DO   Signed by:   Almeta Monas CMA (AAMA) on 08/23/2010   Method used:   Faxed to ...       [facility]* (retail)       [address]       [place], [place]  [address]       Ph: [phone]       Fax: [phone]   RxID:   [id number]

## 2010-12-19 ENCOUNTER — Other Ambulatory Visit: Payer: Self-pay | Admitting: Family Medicine

## 2011-01-28 ENCOUNTER — Other Ambulatory Visit: Payer: Self-pay | Admitting: Family Medicine

## 2011-01-28 MED ORDER — CELECOXIB 200 MG PO CAPS: 200.0000 mg | ORAL_CAPSULE | Freq: Two times a day (BID) | ORAL | Status: DC

## 2011-01-28 MED ORDER — LISINOPRIL-HYDROCHLOROTHIAZIDE 10-12.5 MG PO TABS: 1.0000 | ORAL_TABLET | Freq: Every day | ORAL | Status: DC

## 2011-01-28 MED ORDER — DULOXETINE HCL 60 MG PO CPEP: 60.0000 mg | ORAL_CAPSULE | Freq: Every day | ORAL | Status: DC

## 2011-01-28 NOTE — Telephone Encounter (Signed)
Done

## 2011-01-31 ENCOUNTER — Encounter: Payer: Self-pay | Admitting: Family Medicine

## 2011-02-01 ENCOUNTER — Encounter: Payer: Self-pay | Admitting: Family Medicine

## 2011-02-01 ENCOUNTER — Ambulatory Visit (INDEPENDENT_AMBULATORY_CARE_PROVIDER_SITE_OTHER): Payer: 59 | Admitting: Family Medicine

## 2011-02-01 VITALS — BP 144/86 | HR 97 | Temp 98.7°F | Ht 67.25 in | Wt 200.4 lb

## 2011-02-01 DIAGNOSIS — I10 Essential (primary) hypertension: Secondary | ICD-10-CM

## 2011-02-01 DIAGNOSIS — E119 Type 2 diabetes mellitus without complications: Secondary | ICD-10-CM

## 2011-02-01 DIAGNOSIS — F329 Major depressive disorder, single episode, unspecified: Secondary | ICD-10-CM

## 2011-02-01 DIAGNOSIS — R7309 Other abnormal glucose: Secondary | ICD-10-CM

## 2011-02-01 DIAGNOSIS — Z2911 Encounter for prophylactic immunotherapy for respiratory syncytial virus (RSV): Secondary | ICD-10-CM

## 2011-02-01 DIAGNOSIS — F3289 Other specified depressive episodes: Secondary | ICD-10-CM

## 2011-02-01 DIAGNOSIS — Z0000 Encounter for general adult medical examination without abnormal findings: Secondary | ICD-10-CM

## 2011-02-01 DIAGNOSIS — R739 Hyperglycemia, unspecified: Secondary | ICD-10-CM

## 2011-02-01 LAB — BASIC METABOLIC PANEL
BUN: 17 mg/dL (ref 6–23)
Chloride: 104 mEq/L (ref 96–112)
GFR: 74.53 mL/min (ref >60.00)
Potassium: 5.4 mEq/L — ABNORMAL HIGH (ref 3.5–5.1)
Sodium: 141 mEq/L (ref 135–145)

## 2011-02-01 LAB — CBC WITH DIFFERENTIAL/PLATELET
Basophils Relative: 1 % (ref 0.0–3.0)
Eosinophils Relative: 5.3 % — ABNORMAL HIGH (ref 0.0–5.0)
HCT: 46.5 % (ref 39.0–52.0)
Hemoglobin: 15.9 g/dL (ref 13.0–17.0)
Lymphs Abs: 1.5 10*3/uL (ref 0.7–4.0)
MCV: 96 fl (ref 78.0–100.0)
Monocytes Absolute: 0.5 10*3/uL (ref 0.1–1.0)
Monocytes Relative: 8.6 % (ref 3.0–12.0)
Neutro Abs: 3.5 10*3/uL (ref 1.4–7.7)
Platelets: 228 10*3/uL (ref 150.0–400.0)
WBC: 5.8 10*3/uL (ref 4.5–10.5)

## 2011-02-01 LAB — HEPATIC FUNCTION PANEL
ALT: 74 U/L — ABNORMAL HIGH (ref 0–53)
AST: 63 U/L — ABNORMAL HIGH (ref 0–37)
Albumin: 4.8 g/dL (ref 3.5–5.2)
Total Bilirubin: 0.9 mg/dL (ref 0.3–1.2)
Total Protein: 7.3 g/dL (ref 6.0–8.3)

## 2011-02-01 LAB — POCT URINALYSIS DIPSTICK
Bilirubin, UA: NEGATIVE
Blood, UA: NEGATIVE
Glucose, UA: NEGATIVE
Ketones, UA: NEGATIVE
Nitrite, UA: NEGATIVE
pH, UA: 5

## 2011-02-01 LAB — PSA: PSA: 0.29 ng/mL (ref 0.10–4.00)

## 2011-02-01 LAB — LIPID PANEL
Cholesterol: 218 mg/dL — ABNORMAL HIGH (ref 0–200)
Total CHOL/HDL Ratio: 3
Triglycerides: 56 mg/dL (ref 0.0–149.0)

## 2011-02-01 LAB — HEMOGLOBIN A1C: Hgb A1c MFr Bld: 6.6 % — ABNORMAL HIGH (ref 4.6–6.5)

## 2011-02-01 MED ORDER — LISINOPRIL-HYDROCHLOROTHIAZIDE 20-12.5 MG PO TABS: 1.0000 | ORAL_TABLET | Freq: Every day | ORAL | Status: DC

## 2011-02-01 NOTE — Assessment & Plan Note (Signed)
con't meds Recheck 6 months

## 2011-02-01 NOTE — Assessment & Plan Note (Signed)
Stable con't meds 

## 2011-02-01 NOTE — Patient Instructions (Signed)
Preventative Care for Adults, Male   MAINTAIN REGULAR HEALTH EXAMS   A routine yearly physical is a good way to check in with your primary care [facility] Trujillo about your health and preventive screening. It is also an opportunity to share updates about your health and any concerns you have and receive a thorough all-over exam.   If you smoke or chew tobacco, find out from your caregiver how to quit. It can literally save your life, no matter how long you have been a tobacco user. If you do not use tobacco, never begin.   Maintain a healthy diet and normal weight. Increased weight leads to problems with blood pressure and diabetes. Decrease saturated fat in the diet and increase regular exercise. Get information about proper diet from your caregiver if necessary. Eat a variety of foods, including fruit, vegetables, animal or vegetable protein, such as meat, fish, chicken, and eggs, or beans, lentils, tofu, and grains, such as rice.   High blood pressure causes heart and blood vessel problems. Fat leaves deposits in your arteries that can block them. This causes heart disease and vessel disease elsewhere in your body. Check your blood pressure regularly and keep it within normal limits. Men over age 50 or those who have a family history of high blood pressure should have it checked at least every year.   Aerobic exercise helps maintain good heart health. 30 minutes of moderate-intensity exercise is recommended. For example, a brisk walk that increases your heart rate and breathing. This walk should be done on most days of the week. Persistent high blood pressure should be treated with medicine if weight loss and exercise do not work.   For many men aged 20 and older, having a cholesterol test of the blood every 5 years is recommended. If your cholesterol is found to be borderline high, or if you have heart disease or certain other medical conditions, then you may need to have it monitored more frequently.   Avoid smoking,  drinking alcohol in excess (more than two drinks per day) or use of street drugs. Do not share needles with anyone. Ask for professional help if you need assistance or instructions on stopping the use of alcohol, cigarettes, and/or drugs.   Maintain normal blood lipids and cholesterol by minimizing your intake of saturated fat. Eat a well rounded diet otherwise, with plenty of fruit and vegetables. The National Institutes of Health encourage men to eat 5-9 servings of fruit and vegetables each day. Your caregiver can help you keep your risk of heart disease or stroke at a lower level.   Ask your caregiver if you are in need of earlier testing because of: a strong family history of heart disease, or you have signs of elevated testosterone (male sex hormone) levels. This can predispose you to earlier heart disease. Ask if you should have a stress test if your history suggests this. A stress test is a test done on a treadmill that looks for heart disease. This test can find disease prior to there being a problem.   Diabetes screening assesses your blood sugar level after a fasting once every 3 years after age 45 if previous tests were normal.   Most routine colon cancer screening begins at the age of 50. On a yearly basis, doctors may provide special easy to use take-home tests to check for hidden blood in the stool. Sigmoidoscopy or colonoscopy can detect the earliest forms of colon cancer and is life saving. These test use a   small camera at the end of a tube to directly examine the colon. Speak to your caregiver about this at age 50, when routine screening begins (and is repeated every 5 years unless early forms of pre-cancerous polyps or small growths are found).   At the age of 50 men usually start screening for prostate cancer every year. Screening may begin at a younger age for those with higher risk. Those at higher risk include African-Americans or having a family history of prostate cancer. There are two types  of tests for prostate cancer:   Prostate-specific antigen (PSA) testing. Recent studies raise questions about prostate cancer using PSA and you should discuss this with your caregiver.   Digital rectal exam (in which your doctor’s lubricated and gloved finger feels for enlargement of the prostate through the anus).   Practice safe sex. Use condoms. Condoms are used for birth control and to help reduce the spread of sexually transmitted infections (or STIs). Unsafe sex is having an unprotected physical relationship with someone who is bisexual, homosexual, uses intravenous street drugs, or going with someone who has sexual relations with high-risk groups. Practicing safe sex helps you avoid getting an STI. Some of the STIs are gonorrhea (the clap), chlamydia, syphilis, trichimonas, herpes, HPV (human papiloma virus) and HIV (human immunodeficiency virus) which causes AIDS. The herpes, HIV and HPV are viral illnesses that have no cure. These can result in disability, cancer and death.   It is not safe for someone who has AIDS or is HIV positive to have unprotected sex with someone else who is positive. The reason for this is the fact that there are many different strains of HIV. If you have a strain that is readily treated with medications and then suddenly introduce a strain from a partner that has no further treatment options, you may suddenly have a strain of HIV that is untreatable. Even if you are both positive for HIV, it is still necessary to practice safe sex.   Use sunscreen with a SPF (or skin protection factor) of 15 or greater. Apply sunscreen liberally and repeatedly throughout the day. Being outside in the sun when your shadow caused by the sun is shorter than you are, means you are being exposed to sun at greater intensity. Lighter skinned people are at a greater risk of skin cancer. Don’t forget to also wear sunglasses in order to protect your eyes from too much damaging sunlight. Damaging sunlight can  accelerate cataract formation.   Once a month do a whole body skin exam or review, using a mirror to look at your back. Notify your caregivers of changes in moles, especially if there are changes in shapes, colors, a size larger than a pencil eraser, an irregular border, or development of new moles.   Keep carbon monoxide and smoke detectors in your home functioning at all times. Change the batteries every 6 months or use a model that plugs into the wall.   Do a monthly exam of your testicles. Gently roll each testicle between your thumb and fingers, feeling for any abnormal lumps. The best time to do this is after a hot shower or bath when the tissues are looser. Notify your caregivers of any lumps, tenderness or changes in size or shape immediately.   Stay up to date with your tetanus shots and other required immunizations. You should have a booster for tetanus every 10 years. Be sure to get your flu shot every year, since 5%-20% of the [place] population   comes down with the flu. The flu vaccine changes each year, so being vaccinated once is not enough. Get your shot in the fall, before the flu season peaks. The table below lists important vaccines to get. Other vaccines to consider include:   Hepatitis A virus (to prevent a form of infection of the liver by a virus acquired from food), Varicella Zoster (a virus that causes shingles).   Meningococcal (against bacteria which cause a form of meningitis).   Brush your teeth twice a day with fluoride toothpaste, and floss once a day. Good oral hygiene prevents tooth decay and gum disease. The problems can be painful, unattractive, and can cause other health problems. Visit your dentist for a routine oral and dental check up and preventive care every 6-12 months.   The Body Mass Index or BMI is a way of measuring how much of your body is fat. Having a BMI above 27 increases the risk of heart disease, diabetes, hypertension, stroke and other problems related to obesity.  Your caregiver can help determine your BMI and based on it develop an exercise and dietary program to help you achieve or maintain this important measurement at a healthful level.   Wear seat belts whenever in a vehicle, whether a passenger or driver, and even for very short drives of a few minutes.   If you bicycle, wear a helmet at all times.   Below is a summary of the most important preventative healthcare services that adult males should seek on a regular basis throughout their lives:   Preventative Care for Adult Males    Preventative Service  Ages 19-39  Ages 40-64  Ages 65 and over    Schedule of medical visits  Every 5 years  Every 5 years     Schedule of dental visits  Every 6-12 months  Every 6-12 months     Health risk assessment and lifestyle counseling  Every 3-5 years  Every 3-5 years  Every 3-5 years    Blood pressure check**  Every 2 years  Every 2 years  Every 2 years    Total cholesterol check including HDL**  Every 5 years beginning at age 35  Every 5 years  Every 5 years through age 75, then optional.    Flexible sigmoidoscopy or colonoscopy**   Every 5 years beginning at age 50  Every 5 years through age 80, then optional.    Prostate screening   Every year beginning at age 50  Every Year    Testicular exam  Monthly  Monthly  Monthly    FOBT (fecal occult blood test)   Every year beginning at age 50  Every year until 80, then optional.    Skin self-exam  Monthly  Monthly  Monthly    Tetanus-diphtheria (Td) immunization  Every 10 years  Every 10 years  Every 10 years    Influenza immunization**  Every year  Every year  Every year    Pneumococcal immunization**  Optional  Optional  Every 5 years    Hepatitis B immunization**  Series of 3 immunizations   (if not done previously, usually given at 0, 1 to 2, and 4 to 6 months)  Check with your caregiver if vaccination not previously given.  Check with your caregiver if vaccination not previously given.    **Family history and personal history of  risk and conditions may change your physician's recommendations.    Document Released: 08/27/2001 Document Re-Released: 09/25/2009   [employer]® Patient Information ©  2011 [employer].

## 2011-02-01 NOTE — Assessment & Plan Note (Signed)
Check fasting labs ghm utd

## 2011-02-01 NOTE — Assessment & Plan Note (Signed)
Cont meds Check labs 

## 2011-02-01 NOTE — Progress Notes (Signed)
  Subjective:    Patient ID: Charles Trujillo, male    DOB: Dec 04, 1950, 59 y.o.   MRN: [id number]  HPI Pt here for cpe and labs.  No complaints.--- except numbness in both hands when his wakes up. Pt has used splints but they gave him blisters.     Review of Systems  Review of Systems  Constitutional: Negative for activity change, appetite change and fatigue.  HENT: Negative for hearing loss, congestion, tinnitus and ear discharge.   Eyes: Negative for visual disturbance (see optho q1y -- vision corrected to 20/20 with glasses).  Respiratory: Negative for cough, chest tightness and shortness of breath.   Cardiovascular: Negative for chest pain, palpitations and leg swelling.  Gastrointestinal: Negative for abdominal pain, diarrhea, constipation and abdominal distention.  Genitourinary: Negative for urgency, frequency, decreased urine volume and difficulty urinating.  Musculoskeletal: Negative for back pain, arthralgias and gait problem.  Skin: Negative for color change, pallor and rash.  Neurological: Negative for dizziness, light-headedness, numbness and headaches.  Hematological: Negative for adenopathy. Does not bruise/bleed easily.  Psychiatric/Behavioral: Negative for suicidal ideas, confusion, sleep disturbance, self-injury, dysphoric mood, decreased concentration and agitation.       Objective:   Physical Exam    BP 144/86  Pulse 97  Temp(Src) 98.7 F (37.1 C) (Oral)  Ht 5' 7.25" (1.708 m)  Wt 200 lb 6.4 oz (90.901 kg)  BMI 31.15 kg/m2  SpO2 97%  General Appearance:    Alert, cooperative, no distress, appears stated age  Head:    Normocephalic, without obvious abnormality, atraumatic  Eyes:    PERRL, conjunctiva/corneas clear, EOM's intact, fundi    benign, both eyes       Ears:    Normal TM's and external ear canals, both ears  Nose:   Nares normal, septum midline, mucosa normal, no drainage   or sinus tenderness  Throat:   Lips, mucosa, and tongue normal; teeth  and gums normal  Neck:   Supple, symmetrical, trachea midline, no adenopathy;       thyroid:  No enlargement/tenderness/nodules; no carotid   bruit or JVD  Back:     Symmetric, no curvature, ROM normal, no CVA tenderness  Lungs:     Clear to auscultation bilaterally, respirations unlabored  Chest wall:    No tenderness or deformity  Heart:    Regular rate and rhythm, S1 and S2 normal, no murmur, rub   or gallop  Abdomen:     Soft, non-tender, bowel sounds active all four quadrants,    no masses, no organomegaly  Genitalia:    Normal male without lesion, discharge or tenderness  Rectal:    Normal tone, normal prostate, no masses or tenderness;   guaiac negative stool  Extremities:   Extremities normal, atraumatic, no cyanosis or edema  Pulses:   2+ and symmetric all extremities  Skin:   Skin color, texture, turgor normal, no rashes or lesions  Lymph nodes:   Cervical, supraclavicular, and axillary nodes normal  Neurologic:   CNII-XII intact. Normal strength, sensation and reflexes      throughout      Assessment & Plan:

## 2011-02-06 ENCOUNTER — Telehealth: Payer: Self-pay

## 2011-02-06 MED ORDER — METFORMIN HCL ER 500 MG PO TB24: 1000.0000 mg | ORAL_TABLET | Freq: Every day | ORAL | Status: DC

## 2011-02-06 NOTE — Telephone Encounter (Signed)
Discussed labs with patient and he voiced understanding. Copy of labs mailed and Rx sent to pharmacy. We discussed LFT and he stated he has a drink ever now and then, Scheduled a recheck  on 02/20/11      KP

## 2011-02-06 NOTE — Telephone Encounter (Signed)
Message copied by Arnette Norris on Wed Feb 06, 2011  8:42 AM ------      Message from: Lelon Perla      Created: Sun Feb 03, 2011  3:14 PM       Increase glucophage xr 500 to 2 po qd #60  2 refills      LFT elevated----  Any Etoh or tylenol?   Recheck 2 weeks----790.4  Hep, ggt

## 2011-02-19 ENCOUNTER — Other Ambulatory Visit: Payer: Self-pay | Admitting: Family Medicine

## 2011-02-20 ENCOUNTER — Other Ambulatory Visit (INDEPENDENT_AMBULATORY_CARE_PROVIDER_SITE_OTHER): Payer: 59

## 2011-02-20 DIAGNOSIS — R7402 Elevation of levels of lactic acid dehydrogenase (LDH): Secondary | ICD-10-CM

## 2011-02-20 LAB — HEPATIC FUNCTION PANEL
ALT: 64 U/L — ABNORMAL HIGH (ref 0–53)
Albumin: 4.4 g/dL (ref 3.5–5.2)
Total Protein: 7 g/dL (ref 6.0–8.3)

## 2011-02-20 LAB — GAMMA GT: GGT: 79 U/L — ABNORMAL HIGH (ref 7–51)

## 2011-02-20 NOTE — Progress Notes (Signed)
Labs only

## 2011-04-28 ENCOUNTER — Other Ambulatory Visit: Payer: Self-pay | Admitting: Family Medicine

## 2011-05-26 ENCOUNTER — Other Ambulatory Visit: Payer: Self-pay | Admitting: Family Medicine

## 2011-06-11 ENCOUNTER — Ambulatory Visit (INDEPENDENT_AMBULATORY_CARE_PROVIDER_SITE_OTHER): Payer: 59 | Admitting: Family Medicine

## 2011-06-11 ENCOUNTER — Encounter: Payer: Self-pay | Admitting: Family Medicine

## 2011-06-11 VITALS — BP 130/86 | HR 105 | Temp 99.0°F | Wt 198.6 lb

## 2011-06-11 DIAGNOSIS — R109 Unspecified abdominal pain: Secondary | ICD-10-CM

## 2011-06-11 DIAGNOSIS — R34 Anuria and oliguria: Secondary | ICD-10-CM

## 2011-06-11 DIAGNOSIS — K4090 Unilateral inguinal hernia, without obstruction or gangrene, not specified as recurrent: Secondary | ICD-10-CM

## 2011-06-11 DIAGNOSIS — R82998 Other abnormal findings in urine: Secondary | ICD-10-CM

## 2011-06-11 LAB — POCT URINALYSIS DIPSTICK
Glucose, UA: NEGATIVE
Spec Grav, UA: 1.02

## 2011-06-11 MED ORDER — CIPROFLOXACIN HCL 500 MG PO TABS: 500.0000 mg | ORAL_TABLET | Freq: Two times a day (BID) | ORAL | Status: AC

## 2011-06-11 NOTE — Patient Instructions (Signed)
Hernia °A hernia occurs when an internal organ pushes out through a weak spot in the abdominal wall. Hernias most commonly occur in the groin and around the navel. Hernias often can be pushed back into place (reduced). Most hernias tend to get worse over time. Some abdominal hernias can get stuck in the opening (irreducible or incarcerated hernia) and cannot be reduced. An irreducible abdominal hernia which is tightly squeezed into the opening is at risk for impaired blood supply (strangulated hernia). A strangulated hernia is a medical emergency. Because of the risk for an irreducible or strangulated hernia, surgery may be recommended to repair a hernia. °CAUSES  °· Heavy lifting.  °· Prolonged coughing.  °· Straining to have a bowel movement.  °· A cut (incision) made during an abdominal surgery.  °HOME CARE INSTRUCTIONS  °· Bed rest is not required. You may continue your normal activities.  °· Avoid lifting more than 10 pounds (4.5 kg) or straining.  °· Cough gently. If you are a smoker it is best to stop. Even the best hernia repair can break down with the continual strain of coughing. Even if you do not have your hernia repaired, a cough will continue to aggravate the problem.  °· Do not wear anything tight over your hernia. Do not try to keep it in with an outside bandage or truss. These can damage abdominal contents if they are trapped within the hernia sac.  °· Eat a normal diet.  °· Avoid constipation. Straining over long periods of time will increase hernia size and encourage breakdown of repairs. If you cannot do this with diet alone, stool softeners may be used.  °SEEK IMMEDIATE MEDICAL CARE IF:  °· You have a fever.  °· You develop increasing abdominal pain.  °· You feel nauseous or vomit.  °· Your hernia is stuck outside the abdomen, looks discolored, feels hard, or is tender.  °· You have any changes in your bowel habits or in the hernia that are unusual for you.  °· You have increased pain or  swelling around the hernia.  °· You cannot push the hernia back in place by applying gentle pressure while lying down.  °MAKE SURE YOU:  °· Understand these instructions.  °· Will watch your condition.  °· Will get help right away if you are not doing well or get worse.  °Document Released: 07/01/2005 Document Revised: 03/13/2011 Document Reviewed: 02/18/2008 °[employer]® Patient Information ©2012 [employer]. °

## 2011-06-11 NOTE — Progress Notes (Signed)
Addended by: Arnette Norris on: 06/11/2011 02:19 PM   Modules accepted: Orders

## 2011-06-11 NOTE — Progress Notes (Signed)
  Subjective:     Charles Trujillo is a 60 y.o. male who presents for evaluation of abdominal pain. Onset was several days ago. Symptoms have been gradually worsening. The pain is described as sharp, and is 5/10 in intensity. Pain is located in the right groin without radiation.  Aggravating factors: activity, bowel movement and pressure.  Alleviating factors: none. Associated symptoms: none. The patient denies anorexia, arthralagias, belching, chills, constipation, diarrhea, dysuria, fever, flatus, frequency, headache, hematochezia, hematuria, melena, myalgias, nausea, sweats and vomiting.  The patient's history has been marked as reviewed and updated as appropriate.  Review of Systems Pertinent items are noted in HPI.     Objective:    BP 130/86  Pulse 105  Temp(Src) 99 F (37.2 C) (Oral)  Wt 198 lb 9.6 oz (90.084 kg)  SpO2 95% General appearance: alert, cooperative, appears stated age and no distress Abdomen: soft, non-tender; bowel sounds normal; no masses,  no organomegaly Male genitalia: abnormal findings: inguinal hernia present right    Assessment:    Abdominal pain, likely secondary to hernia .    Plan:    Referral to General Surgery. go to ER if symptoms worsen

## 2011-06-14 LAB — URINE CULTURE
Colony Count: NO GROWTH
Organism ID, Bacteria: NO GROWTH

## 2011-06-18 ENCOUNTER — Ambulatory Visit: Payer: 59 | Admitting: Family Medicine

## 2011-06-26 ENCOUNTER — Encounter (INDEPENDENT_AMBULATORY_CARE_PROVIDER_SITE_OTHER): Payer: Self-pay | Admitting: Surgery

## 2011-06-26 ENCOUNTER — Ambulatory Visit (INDEPENDENT_AMBULATORY_CARE_PROVIDER_SITE_OTHER): Payer: 59 | Admitting: Surgery

## 2011-06-26 DIAGNOSIS — K4090 Unilateral inguinal hernia, without obstruction or gangrene, not specified as recurrent: Secondary | ICD-10-CM | POA: Insufficient documentation

## 2011-06-26 NOTE — Progress Notes (Signed)
Patient ID: Charles Trujillo, male   DOB: 08-23-1950, 60 y.o.   MRN: [id number]  Chief Complaint  Patient presents with  . Other    new pt- eval RIH    HPI Charles Trujillo is a 60 y.o. male. HPI Patient presents at the request of Dr. Laury Axon do to a bulge in his right groin. He was raking leaves last month and began to develop some discomfort in the right groin. The pain was mild and sharp. Location was right groin. Exertion the pain worse. The pain improved with rest.  Past Medical History  Diagnosis Date  . Depression   . Hypertension   . Elevated LFTs     previously evaluated by Dr Jarold Motto  . Elevated glucose   . Diabetes mellitus   . Arthritis   . Inguinal hernia     right    Past Surgical History  Procedure Date  . Knee surgery     age 31- right  . Achilles tendon repair 1999  . Squamous cell carcinoma excision     left knee  . Knee arthroscopy 1993    left knee    Family History  Problem Relation Age of Onset  . Diabetes Mother   . Hypertension Mother   . Heart disease Mother   . Stroke Father 27  . Parkinsonism Father   . Depression Brother   . Alcohol abuse Brother   . Cancer Maternal Grandmother     bone    Social History History  Substance Use Topics  . Smoking status: Current Some Day Smoker    Types: Cigars  . Smokeless tobacco: Not on file  . Alcohol Use: 3.6 oz/week    6 Cans of beer per week    Allergies  Allergen Reactions  . Codeine Phosphate     REACTION: nausea    Current Outpatient Prescriptions  Medication Sig Dispense Refill  . CELEBREX 200 MG capsule TAKE (1) CAPSULE DAILY.  30 each  1  . CYMBALTA 60 MG capsule TAKE (1) CAPSULE DAILY.  30 each  1  . GLUCOPHAGE XR 500 MG 24 hr tablet TAKE 2 TABLETS EACH AM WITH FOOD.  60 each  2  . glucose blood (ONE TOUCH TEST STRIPS) test strip 1 each by Other route 2 (two) times daily. accu check       . lisinopril-hydrochlorothiazide (ZESTORETIC) 20-12.5 MG per tablet Take 1  tablet by mouth daily.  30 tablet  5  . ONETOUCH DELICA LANCETS MISC 2 (two) times daily. accu check       . sildenafil (VIAGRA) 100 MG tablet Take 100 mg by mouth as needed.          Review of Systems Review of Systems  Constitutional: Negative for fever, chills and unexpected weight change.  HENT: Negative for hearing loss, congestion, sore throat, trouble swallowing and voice change.   Eyes: Negative for visual disturbance.  Respiratory: Negative for cough and wheezing.   Cardiovascular: Negative for chest pain, palpitations and leg swelling.  Gastrointestinal: Negative for nausea, vomiting, abdominal pain, diarrhea, constipation, blood in stool, abdominal distention, anal bleeding and rectal pain.  Genitourinary: Negative for hematuria and difficulty urinating.  Musculoskeletal: Negative for arthralgias.  Skin: Negative for rash and wound.  Neurological: Negative for seizures, syncope, weakness and headaches.  Hematological: Negative for adenopathy. Does not bruise/bleed easily.  Psychiatric/Behavioral: Negative for confusion.    Blood pressure 130/90, pulse 98, temperature 97.8 F (36.6 C), temperature source Temporal,  resp. rate 16, height 5\' 7"  (1.702 m), weight 199 lb 6.4 oz (90.447 kg).  Physical Exam Physical Exam  Constitutional: He is oriented to person, place, and time. He appears well-developed and well-nourished.  HENT:  Head: Normocephalic and atraumatic.  Eyes: Pupils are equal, round, and reactive to light.  Neck: Normal range of motion. Neck supple.  Cardiovascular: Normal rate and regular rhythm.   Pulmonary/Chest: Effort normal and breath sounds normal.  Abdominal: Soft. Bowel sounds are normal.  Genitourinary:       Small reducible right inguinal hernia. No evidence of left inguinal hernia.  Musculoskeletal: Normal range of motion.  Neurological: He is alert and oriented to person, place, and time.    Data Reviewed   Assessment    Right inguinal  hernia reducible    Plan    Treatment options were discussed today. I discussed observation of therapy versus surgical therapy. I discussed laparoscopic versus open repair with mesh. He elected per she was laparoscopic right inguinal hernia repair with mesh.The risk of hernia repair include bleeding,  Infection,   Recurrence of the hernia,  Mesh use, chronic pain,  Organ injury,  Bowel injury,  Bladder injury,   nerve injury with numbness around the incision,  Death,  and worsening of preexisting  medical problems.  The alternatives to surgery have been discussed as well..  Long term expectations of both operative and non operative treatments have been discussed.   The patient agrees to proceed.       Haskell Rihn A. 06/26/2011, 11:39 AM

## 2011-06-26 NOTE — Patient Instructions (Signed)
Hernia Repair with Laparoscope A hernia occurs when an internal organ pushes out through a weak spot in the belly (abdominal) wall muscles. Hernias most commonly occur in the groin and around the navel. Hernias can also occur through a cut by the surgeon (incision) after an abdominal operation. A hernia may be caused by:  Lifting heavy objects.   Prolonged coughing.   Straining to move your bowels.  Hernias can often be pushed back into place (reduced). Most hernias tend to get worse over time. Problems occur when abdominal contents get stuck in the opening and the blood supply is blocked or impaired (incarcerated hernia). Because of these risks, you require surgery to repair the hernia. Your hernia will be repaired using a laparoscope. Laparoscopic surgery is a type of minimally invasive surgery. It does not involve making a typical surgical cut (incision) in the skin. A laparoscope is a telescope-like rod and lens system. It is usually connected to a video camera and a light source so your caregiver can clearly see the operative area. The instruments are inserted through  to  inch (5 mm or 10 mm) openings in the skin at specific locations. A working and viewing space is created by blowing a small amount of carbon dioxide gas into the abdominal cavity. The abdomen is essentially blown up like a balloon (insufflated). This elevates the abdominal wall above the internal organs like a dome. The carbon dioxide gas is common to the human body and can be absorbed by tissue and removed by the respiratory system. Once the repair is completed, the small incisions will be closed with either stitches (sutures) or staples (just like a paper stapler only this staple holds the skin together). LET YOUR CAREGIVERS KNOW ABOUT:  Allergies.   Medications taken including herbs, eye drops, over the counter medications, and creams.   Use of steroids (by mouth or creams).   Previous problems with anesthetics or  Novocaine.   Possibility of pregnancy, if this applies.   History of blood clots (thrombophlebitis).   History of bleeding or blood problems.   Previous surgery.   Other health problems.  BEFORE THE PROCEDURE  Laparoscopy can be done either in a hospital or out-patient clinic. You may be given a mild sedative to help you relax before the procedure. Once in the operating room, you will be given a general anesthesia to make you sleep (unless you and your caregiver choose a different anesthetic).  AFTER THE PROCEDURE  After the procedure you will be watched in a recovery area. Depending on what type of hernia was repaired, you might be admitted to the hospital or you might go home the same day. With this procedure you may have less pain and scarring. This usually results in a quicker recovery and less risk of infection. HOME CARE INSTRUCTIONS   Bed rest is not required. You may continue your normal activities but avoid heavy lifting (more than 10 pounds) or straining.   Cough gently. If you are a smoker it is best to stop, as even the best hernia repair can break down with the continual strain of coughing.   Avoid driving until given the OK by your surgeon.   There are no dietary restrictions unless given otherwise.   TAKE ALL MEDICATIONS AS DIRECTED.   Only take over-the-counter or prescription medicines for pain, discomfort, or fever as directed by your caregiver.  SEEK MEDICAL CARE IF:   There is increasing abdominal pain or pain in your incisions.  There is more bleeding from incisions, other than minimal spotting.   You feel light headed or faint.   You develop an unexplained fever, chills, and/or an oral temperature above 102 F (38.9 C).   You have redness, swelling, or increasing pain in the wound.   Pus coming from wound.   A foul smell coming from the wound or dressings.  SEEK IMMEDIATE MEDICAL CARE IF:   You develop a rash.   You have difficulty breathing.    You have any allergic problems.  MAKE SURE YOU:   Understand these instructions.   Will watch your condition.   Will get help right away if you are not doing well or get worse.  Document Released: 07/01/2005 Document Revised: 03/13/2011 Document Reviewed: 05/31/2009 [facility] Patient Information 2012 [place], [place].Inguinal Hernia, Adult Muscles help keep everything in the body in its proper place. But if a weak spot in the muscles develops, something can poke through. That is called a hernia. When this happens in the lower part of the belly (abdomen), it is called an inguinal hernia. (It takes its name from a part of the body in this region called the inguinal canal.) A weak spot in the wall of muscles lets some fat or part of the small intestine bulge through. An inguinal hernia can develop at any age. Men get them more often than women. CAUSES  In adults, an inguinal hernia develops over time.  It can be triggered by:   Suddenly straining the muscles of the lower abdomen.   Lifting heavy objects.   Straining to have a bowel movement. Difficult bowel movements (constipation) can lead to this.   Constant coughing. This may be caused by smoking or lung disease.   Being overweight.   Being pregnant.   Working at a job that requires long periods of standing or heavy lifting.   Having had an inguinal hernia before.  One type can be an emergency situation. It is called a strangulated inguinal hernia. It develops if part of the small intestine slips through the weak spot and cannot get back into the abdomen. The blood supply can be cut off. If that happens, part of the intestine may die. This situation requires emergency surgery. SYMPTOMS  Often, a small inguinal hernia has no symptoms. It is found when a healthcare provider does a physical exam. Larger hernias usually have symptoms.   In adults, symptoms may include:   A lump in the groin. This is easier to see when the person is  standing. It might disappear when lying down.   In men, a lump in the scrotum.   Pain or burning in the groin. This occurs especially when lifting, straining or coughing.   A dull ache or feeling of pressure in the groin.   Signs of a strangulated hernia can include:   A bulge in the groin that becomes very painful and tender to the touch.   A bulge that turns red or purple.   Fever, nausea and vomiting.   Inability to have a bowel movement or to pass gas.  DIAGNOSIS  To decide if you have an inguinal hernia, a healthcare provider will probably do a physical examination.  This will include asking questions about any symptoms you have noticed.   The healthcare provider might feel the groin area and ask you to cough. If an inguinal hernia is felt, the healthcare provider may try to slide it back into the abdomen.   Usually no other tests  are needed.  TREATMENT  Treatments can vary. The size of the hernia makes a difference. Options include:  Watchful waiting. This is often suggested if the hernia is small and you have had no symptoms.   No medical procedure will be done unless symptoms develop.   You will need to watch closely for symptoms. If any occur, contact your healthcare provider right away.   Surgery. This is used if the hernia is larger or you have symptoms.   Open surgery. This is usually an outpatient procedure (you will not stay overnight in a hospital). An cut (incision) is made through the skin in the groin. The hernia is put back inside the abdomen. The weak area in the muscles is then repaired by herniorrhaphy or hernioplasty. Herniorrhaphy: in this type of surgery, the weak muscles are sewn back together. Hernioplasty: a patch or mesh is used to close the weak area in the abdominal wall.   Laparoscopy. In this procedure, a surgeon makes small incisions. A thin tube with a tiny video camera (called a laparoscope) is put into the abdomen. The surgeon repairs the  hernia with mesh by looking with the video camera and using two long instruments.  HOME CARE INSTRUCTIONS   After surgery to repair an inguinal hernia:   You will need to take pain medicine prescribed by your healthcare provider. Follow all directions carefully.   You will need to take care of the wound from the incision.   Your activity will be restricted for awhile. This will probably include no heavy lifting for several weeks. You also should not do anything too active for a few weeks. When you can return to work will depend on the type of job that you have.   During "watchful waiting" periods, you should:   Maintain a healthy weight.   Eat a diet high in fiber (fruits, vegetables and whole grains).   Drink plenty of fluids to avoid constipation. This means drinking enough water and other liquids to keep your urine clear or pale yellow.   Do not lift heavy objects.   Do not stand for long periods of time.   Quit smoking. This should keep you from developing a frequent cough.  SEEK MEDICAL CARE IF:   A bulge develops in your groin area.   You feel pain, a burning sensation or pressure in the groin. This might be worse if you are lifting or straining.   You develop a fever of more than 100.5 F (38.1 C).  SEEK IMMEDIATE MEDICAL CARE IF:   Pain in the groin increases suddenly.   A bulge in the groin gets bigger suddenly and does not go down.   For men, there is sudden pain in the scrotum. Or, the size of the scrotum increases.   A bulge in the groin area becomes red or purple and is painful to touch.   You have nausea or vomiting that does not go away.   You feel your heart beating much faster than normal.   You cannot have a bowel movement or pass gas.   You develop a fever of more than 102.0 F (38.9 C).  Document Released: 11/17/2008 Document Revised: 03/13/2011 Document Reviewed: 11/17/2008 [facility] Patient Information 2012 [place], [place].

## 2011-07-10 ENCOUNTER — Other Ambulatory Visit: Payer: Self-pay | Admitting: Family Medicine

## 2011-07-10 MED ORDER — CELECOXIB 200 MG PO CAPS: 200.0000 mg | ORAL_CAPSULE | Freq: Every day | ORAL | Status: DC

## 2011-07-10 NOTE — Telephone Encounter (Signed)
Faxed.   KP 

## 2011-07-16 HISTORY — PX: HERNIA REPAIR: SHX51

## 2011-07-29 ENCOUNTER — Encounter (HOSPITAL_COMMUNITY): Payer: Self-pay | Admitting: Pharmacy Technician

## 2011-08-02 ENCOUNTER — Ambulatory Visit (INDEPENDENT_AMBULATORY_CARE_PROVIDER_SITE_OTHER): Payer: Commercial Managed Care - PPO | Admitting: Surgery

## 2011-08-02 ENCOUNTER — Encounter (INDEPENDENT_AMBULATORY_CARE_PROVIDER_SITE_OTHER): Payer: Self-pay | Admitting: Surgery

## 2011-08-02 VITALS — BP 142/90 | HR 88 | Temp 98.6°F | Resp 12 | Ht 67.0 in | Wt 198.0 lb

## 2011-08-02 DIAGNOSIS — K4090 Unilateral inguinal hernia, without obstruction or gangrene, not specified as recurrent: Secondary | ICD-10-CM

## 2011-08-02 NOTE — Progress Notes (Signed)
Patient ID: Charles Trujillo, male   DOB: 10/25/1950, 61 y.o.   MRN: [id number]  Chief Complaint  Patient presents with  . Follow-up    HPI Charles Trujillo is a 61 y.o. male. HPI Patient presents at the request of Dr. Lowne do to a bulge in his right groin. He was raking leaves 2 months ago  and began to develop some discomfort in the right groin. The pain was mild and sharp. Location was right groin. Exertion the pain worse. The pain improved with rest. He returns for preop visit.  No changes.  Past Medical History  Diagnosis Date  . Depression   . Hypertension   . Elevated LFTs     previously evaluated by Dr Patterson  . Elevated glucose   . Diabetes mellitus   . Arthritis   . Inguinal hernia     right    Past Surgical History  Procedure Date  . Knee surgery     age 14- right  . Achilles tendon repair 1999  . Squamous cell carcinoma excision     left knee  . Knee arthroscopy 1993    left knee    Family History  Problem Relation Age of Onset  . Diabetes Mother   . Hypertension Mother   . Heart disease Mother   . Stroke Father 65  . Parkinsonism Father   . Depression Brother   . Alcohol abuse Brother   . Cancer Maternal Grandmother     bone    Social History History  Substance Use Topics  . Smoking status: Current Some Day Smoker    Types: Cigars  . Smokeless tobacco: Not on file  . Alcohol Use: 3.6 oz/week    6 Cans of beer per week    Allergies  Allergen Reactions  . Codeine Phosphate     REACTION: nausea    Current Outpatient Prescriptions  Medication Sig Dispense Refill  . celecoxib (CELEBREX) 200 MG capsule Take 1 capsule (200 mg total) by mouth daily.  30 capsule  5  . DULoxetine (CYMBALTA) 60 MG capsule       . glucose blood (ONE TOUCH TEST STRIPS) test strip 1 each by Other route 2 (two) times daily. accu check       . ibuprofen (ADVIL,MOTRIN) 200 MG tablet Take 400 mg by mouth every 6 (six) hours as needed. Pain      .  lisinopril-hydrochlorothiazide (PRINZIDE,ZESTORETIC) 20-12.5 MG per tablet Take 1 tablet by mouth daily before breakfast.      . metFORMIN (GLUCOPHAGE XR) 500 MG 24 hr tablet       . ONETOUCH DELICA LANCETS MISC 2 (two) times daily. accu check       . sildenafil (VIAGRA) 100 MG tablet Take 100 mg by mouth as needed.          Review of Systems Review of Systems  Constitutional: Negative for fever, chills and unexpected weight change.  HENT: Negative for hearing loss, congestion, sore throat, trouble swallowing and voice change.   Eyes: Negative for visual disturbance.  Respiratory: Negative for cough and wheezing.   Cardiovascular: Negative for chest pain, palpitations and leg swelling.  Gastrointestinal: Negative for nausea, vomiting, abdominal pain, diarrhea, constipation, blood in stool, abdominal distention, anal bleeding and rectal pain.  Genitourinary: Negative for hematuria and difficulty urinating.  Musculoskeletal: Negative for arthralgias.  Skin: Negative for rash and wound.  Neurological: Negative for seizures, syncope, weakness and headaches.  Hematological: Negative for adenopathy. Does not bruise/bleed   easily.  Psychiatric/Behavioral: Negative for confusion.    Blood pressure 142/90, pulse 88, temperature 98.6 F (37 C), resp. rate 12, height 5' 7" (1.702 m), weight 198 lb (89.812 kg).  Physical Exam Physical Exam  Constitutional: He is oriented to person, place, and time. He appears well-developed and well-nourished.  HENT:  Head: Normocephalic and atraumatic.  Eyes: Pupils are equal, round, and reactive to light.  Neck: Normal range of motion. Neck supple.  Cardiovascular: Normal rate and regular rhythm.   Pulmonary/Chest: Effort normal and breath sounds normal.  Abdominal: Soft. Bowel sounds are normal.  Genitourinary:       Small reducible right inguinal hernia. No evidence of left inguinal hernia.  Musculoskeletal: Normal range of motion.  Neurological: He is  alert and oriented to person, place, and time.    Data Reviewed   Assessment    Right inguinal hernia reducible    Plan    Treatment options were discussed today. I discussed observation of therapy versus surgical therapy. I discussed laparoscopic versus open repair with mesh. He elected per she was laparoscopic right inguinal hernia repair with mesh.The risk of hernia repair include bleeding,  Infection,   Recurrence of the hernia,  Mesh use, chronic pain,  Organ injury,  Bowel injury,  Bladder injury,   nerve injury with numbness around the incision,  Death,  and worsening of preexisting  medical problems.  The alternatives to surgery have been discussed as well..  Long term expectations of both operative and non operative treatments have been discussed.   The patient agrees to proceed.       Lynette Topete A. 08/02/2011, 9:19 AM    

## 2011-08-02 NOTE — Patient Instructions (Signed)
Surgery scheduled 

## 2011-08-05 ENCOUNTER — Encounter (HOSPITAL_COMMUNITY): Payer: Self-pay

## 2011-08-05 ENCOUNTER — Ambulatory Visit (HOSPITAL_COMMUNITY)
Admission: RE | Admit: 2011-08-05 | Discharge: 2011-08-05 | Disposition: A | Discharge status: Home / Self Care | Payer: 59 | Source: Ambulatory Visit | Attending: Surgery | Admitting: Surgery

## 2011-08-05 ENCOUNTER — Encounter (HOSPITAL_COMMUNITY)
Admission: RE | Admit: 2011-08-05 | Discharge: 2011-08-05 | Disposition: A | Discharge status: Home / Self Care | Payer: 59 | Source: Ambulatory Visit | Attending: Surgery | Admitting: Surgery

## 2011-08-05 DIAGNOSIS — K4090 Unilateral inguinal hernia, without obstruction or gangrene, not specified as recurrent: Secondary | ICD-10-CM | POA: Insufficient documentation

## 2011-08-05 DIAGNOSIS — Z01812 Encounter for preprocedural laboratory examination: Secondary | ICD-10-CM | POA: Insufficient documentation

## 2011-08-05 LAB — CBC
HCT: 44.6 % (ref 39.0–52.0)
MCHC: 34.8 g/dL (ref 30.0–36.0)
MCV: 92.1 fL (ref 78.0–100.0)
Platelets: 250 10*3/uL (ref 150–400)
RDW: 12.6 % (ref 11.5–15.5)

## 2011-08-05 LAB — DIFFERENTIAL
Basophils Relative: 1 % (ref 0–1)
Lymphocytes Relative: 24 % (ref 12–46)
Lymphs Abs: 1.4 10*3/uL (ref 0.7–4.0)
Monocytes Relative: 10 % (ref 3–12)
Neutro Abs: 3.5 10*3/uL (ref 1.7–7.7)
Neutrophils Relative %: 59 % (ref 43–77)

## 2011-08-05 LAB — SURGICAL PCR SCREEN
MRSA, PCR: NEGATIVE
Staphylococcus aureus: NEGATIVE

## 2011-08-05 LAB — COMPREHENSIVE METABOLIC PANEL
AST: 43 U/L — ABNORMAL HIGH (ref 0–37)
Albumin: 4.2 g/dL (ref 3.5–5.2)
BUN: 13 mg/dL (ref 6–23)
Calcium: 9.8 mg/dL (ref 8.4–10.5)
Creatinine, Ser: 1.07 mg/dL (ref 0.50–1.35)
Total Protein: 7.1 g/dL (ref 6.0–8.3)

## 2011-08-05 NOTE — Patient Instructions (Signed)
20 Charles Trujillo  08/05/2011   Your procedure is scheduled on:  Friday 1/25  AT 7:30 AM  Report to [facility] at 5:30 AM.  Call this number if you have problems the morning of surgery: [phone]   Remember:DO NOT TAKE ANY DIABETIC MEDICATION THE AM OF YOUR SURGERY.   Do not eat food OR DRINK ANYTHING AFTER MIDNIGHT THE NIGHT BEFORE YOUR SURGERY.   Take these medicines the morning of surgery with A SIP OF WATER: CYMBALTA   Do not wear jewelry.      Do not bring valuables to the hospital.  Contacts, dentures or bridgework may not be worn into surgery.  Leave suitcase in the car. After surgery it may be brought to your room.  For patients admitted to the hospital, checkout time is 11:00 AM the day of discharge.   Patients discharged the day of surgery will not be allowed to drive hom  Special Instructions: CHG Shower Use Special Wash: 1/2 bottle night before surgery and 1/2 bottle morning of surgery.   Please read over the following fact sheets that you were given: MRSA Information

## 2011-08-05 NOTE — Pre-Procedure Instructions (Signed)
PT HAS EKG REPORT FROM [place]-DR. LOWNE - DONE 02/01/11  - COPY ON THIS CHART AND IN EPIC. CHEST XRAY WAS DONE TODAY AT [facility] PREOP.

## 2011-08-09 ENCOUNTER — Encounter (HOSPITAL_COMMUNITY): Payer: Self-pay | Admitting: Certified Registered Nurse Anesthetist

## 2011-08-09 ENCOUNTER — Encounter (HOSPITAL_COMMUNITY): Payer: Self-pay | Admitting: Surgery

## 2011-08-09 ENCOUNTER — Encounter (HOSPITAL_COMMUNITY): Payer: Self-pay

## 2011-08-09 ENCOUNTER — Ambulatory Visit (HOSPITAL_COMMUNITY): Payer: 59 | Admitting: Certified Registered Nurse Anesthetist

## 2011-08-09 ENCOUNTER — Ambulatory Visit (HOSPITAL_COMMUNITY)
Admission: RE | Admit: 2011-08-09 | Discharge: 2011-08-09 | Disposition: A | Discharge status: Home / Self Care | Payer: 59 | Source: Ambulatory Visit | Attending: Surgery | Admitting: Surgery

## 2011-08-09 ENCOUNTER — Encounter (HOSPITAL_COMMUNITY)
Admission: RE | Disposition: A | Discharge status: Home / Self Care | Payer: Self-pay | Source: Ambulatory Visit | Attending: Surgery

## 2011-08-09 DIAGNOSIS — K4090 Unilateral inguinal hernia, without obstruction or gangrene, not specified as recurrent: Secondary | ICD-10-CM | POA: Insufficient documentation

## 2011-08-09 DIAGNOSIS — E119 Type 2 diabetes mellitus without complications: Secondary | ICD-10-CM | POA: Insufficient documentation

## 2011-08-09 DIAGNOSIS — I10 Essential (primary) hypertension: Secondary | ICD-10-CM | POA: Insufficient documentation

## 2011-08-09 DIAGNOSIS — Z79899 Other long term (current) drug therapy: Secondary | ICD-10-CM | POA: Insufficient documentation

## 2011-08-09 HISTORY — PX: INGUINAL HERNIA REPAIR: SHX194

## 2011-08-09 LAB — GLUCOSE, CAPILLARY

## 2011-08-09 SURGERY — REPAIR, HERNIA, INGUINAL, LAPAROSCOPIC
Anesthesia: General | Site: Abdomen | Laterality: Right | Wound class: Clean

## 2011-08-09 MED ORDER — FENTANYL CITRATE 0.05 MG/ML IJ SOLN
INTRAMUSCULAR | Status: DC | PRN
  Administered 2011-08-09: 100 ug via INTRAVENOUS
  Administered 2011-08-09 (×3): 50 ug via INTRAVENOUS

## 2011-08-09 MED ORDER — LACTATED RINGERS IV SOLN: INTRAVENOUS | Status: DC

## 2011-08-09 MED ORDER — CISATRACURIUM BESYLATE 2 MG/ML IV SOLN
INTRAVENOUS | Status: DC | PRN
  Administered 2011-08-09 (×2): 4 mg via INTRAVENOUS
  Administered 2011-08-09: 6 mg via INTRAVENOUS

## 2011-08-09 MED ORDER — SODIUM CHLORIDE 0.9 % IJ SOLN: 3.0000 mL | INTRAMUSCULAR | Status: DC | PRN

## 2011-08-09 MED ORDER — GLYCOPYRROLATE 0.2 MG/ML IJ SOLN
INTRAMUSCULAR | Status: DC | PRN
  Administered 2011-08-09: .5 mg via INTRAVENOUS

## 2011-08-09 MED ORDER — HYDROMORPHONE HCL PF 1 MG/ML IJ SOLN
INTRAMUSCULAR | Status: AC
  Filled 2011-08-09: qty 1

## 2011-08-09 MED ORDER — MORPHINE SULFATE 10 MG/ML IJ SOLN: 1.0000 mg | INTRAMUSCULAR | Status: DC | PRN

## 2011-08-09 MED ORDER — BUPIVACAINE HCL (PF) 0.5 % IJ SOLN
INTRAMUSCULAR | Status: DC | PRN
  Administered 2011-08-09: 18 mL

## 2011-08-09 MED ORDER — ACETAMINOPHEN 650 MG RE SUPP
650.0000 mg | RECTAL | Status: DC | PRN
  Filled 2011-08-09: qty 1

## 2011-08-09 MED ORDER — LIDOCAINE HCL (CARDIAC) 20 MG/ML IV SOLN
INTRAVENOUS | Status: DC | PRN
  Administered 2011-08-09: 80 mg via INTRAVENOUS

## 2011-08-09 MED ORDER — OXYCODONE HCL 5 MG PO TABS
ORAL_TABLET | ORAL | Status: AC
  Administered 2011-08-09: 10 mg via ORAL
  Filled 2011-08-09: qty 2

## 2011-08-09 MED ORDER — PROMETHAZINE HCL 25 MG/ML IJ SOLN: 6.2500 mg | INTRAMUSCULAR | Status: DC | PRN

## 2011-08-09 MED ORDER — PROPOFOL 10 MG/ML IV BOLUS
INTRAVENOUS | Status: DC | PRN
  Administered 2011-08-09: 200 mg via INTRAVENOUS

## 2011-08-09 MED ORDER — SODIUM CHLORIDE 0.9 % IV SOLN: 250.0000 mL | INTRAVENOUS | Status: DC | PRN

## 2011-08-09 MED ORDER — PROMETHAZINE HCL 25 MG/ML IJ SOLN: 12.5000 mg | Freq: Four times a day (QID) | INTRAMUSCULAR | Status: DC | PRN

## 2011-08-09 MED ORDER — ONDANSETRON HCL 4 MG/2ML IJ SOLN
INTRAMUSCULAR | Status: DC | PRN
  Administered 2011-08-09: 4 mg via INTRAVENOUS

## 2011-08-09 MED ORDER — SUCCINYLCHOLINE CHLORIDE 20 MG/ML IJ SOLN
INTRAMUSCULAR | Status: DC | PRN
  Administered 2011-08-09: 100 mg via INTRAVENOUS

## 2011-08-09 MED ORDER — ACETAMINOPHEN 325 MG PO TABS: 650.0000 mg | ORAL_TABLET | ORAL | Status: DC | PRN

## 2011-08-09 MED ORDER — CEFAZOLIN SODIUM-DEXTROSE 2-3 GM-% IV SOLR
INTRAVENOUS | Status: AC
  Filled 2011-08-09: qty 50

## 2011-08-09 MED ORDER — BUPIVACAINE HCL (PF) 0.5 % IJ SOLN
INTRAMUSCULAR | Status: AC
  Filled 2011-08-09: qty 30

## 2011-08-09 MED ORDER — LACTATED RINGERS IV SOLN
INTRAVENOUS | Status: DC | PRN
  Administered 2011-08-09 (×3): via INTRAVENOUS

## 2011-08-09 MED ORDER — NEOSTIGMINE METHYLSULFATE 1 MG/ML IJ SOLN
INTRAMUSCULAR | Status: DC | PRN
  Administered 2011-08-09: 2.5 mg via INTRAVENOUS

## 2011-08-09 MED ORDER — MIDAZOLAM HCL 5 MG/5ML IJ SOLN
INTRAMUSCULAR | Status: DC | PRN
  Administered 2011-08-09: 2 mg via INTRAVENOUS

## 2011-08-09 MED ORDER — ACETAMINOPHEN 10 MG/ML IV SOLN
INTRAVENOUS | Status: AC
  Filled 2011-08-09: qty 100

## 2011-08-09 MED ORDER — ACETAMINOPHEN 10 MG/ML IV SOLN
INTRAVENOUS | Status: DC | PRN
  Administered 2011-08-09: 1000 mg via INTRAVENOUS

## 2011-08-09 MED ORDER — HYDROMORPHONE HCL PF 1 MG/ML IJ SOLN
0.2500 mg | INTRAMUSCULAR | Status: DC | PRN
  Administered 2011-08-09: 0.25 mg via INTRAVENOUS

## 2011-08-09 MED ORDER — CEFAZOLIN SODIUM-DEXTROSE 2-3 GM-% IV SOLR
2.0000 g | INTRAVENOUS | Status: AC
  Administered 2011-08-09: 2 g via INTRAVENOUS

## 2011-08-09 MED ORDER — OXYCODONE HCL 5 MG PO TABS
5.0000 mg | ORAL_TABLET | ORAL | Status: DC | PRN
  Administered 2011-08-09: 10 mg via ORAL

## 2011-08-09 MED ORDER — ONDANSETRON HCL 4 MG/2ML IJ SOLN: 4.0000 mg | Freq: Four times a day (QID) | INTRAMUSCULAR | Status: DC | PRN

## 2011-08-09 MED ORDER — EPHEDRINE SULFATE 50 MG/ML IJ SOLN
INTRAMUSCULAR | Status: DC | PRN
  Administered 2011-08-09: 5 mg via INTRAVENOUS
  Administered 2011-08-09: 10 mg via INTRAVENOUS

## 2011-08-09 MED ORDER — SODIUM CHLORIDE 0.9 % IJ SOLN: 3.0000 mL | Freq: Two times a day (BID) | INTRAMUSCULAR | Status: DC

## 2011-08-09 SURGICAL SUPPLY — 34 items
ADH SKN CLS APL DERMABOND .7 (GAUZE/BANDAGES/DRESSINGS) ×1
APPLIER CLIP 5 13 M/L LIGAMAX5 (MISCELLANEOUS)
APR CLP MED LRG 5 ANG JAW (MISCELLANEOUS)
CLIP APPLIE 5 13 M/L LIGAMAX5 (MISCELLANEOUS) IMPLANT
CLOTH BEACON ORANGE TIMEOUT ST (SAFETY) ×2 IMPLANT
DECANTER SPIKE VIAL GLASS SM (MISCELLANEOUS) ×2 IMPLANT
DERMABOND ADVANCED (GAUZE/BANDAGES/DRESSINGS) ×1
DERMABOND ADVANCED .7 DNX12 (GAUZE/BANDAGES/DRESSINGS) IMPLANT
DEVICE SECURE STRAP 25 ABSORB (INSTRUMENTS) ×1 IMPLANT
DISSECT BALLN SPACEMKR + OVL (BALLOONS) ×2
DISSECTOR BALLN SPACEMKR + OVL (BALLOONS) ×1 IMPLANT
DISSECTOR BLUNT TIP ENDO 5MM (MISCELLANEOUS) IMPLANT
DRAPE LAPAROSCOPIC ABDOMINAL (DRAPES) ×2 IMPLANT
DRAPE WARM FLUID 44X44 (DRAPE) ×2 IMPLANT
ELECT REM PT RETURN 9FT ADLT (ELECTROSURGICAL) ×2
ELECTRODE REM PT RTRN 9FT ADLT (ELECTROSURGICAL) ×1 IMPLANT
GLOVE BIOGEL PI IND STRL 7.0 (GLOVE) ×1 IMPLANT
GLOVE BIOGEL PI INDICATOR 7.0 (GLOVE) ×1
GLOVE INDICATOR 8.0 STRL GRN (GLOVE) ×4 IMPLANT
GLOVE SS BIOGEL STRL SZ 8 (GLOVE) ×1 IMPLANT
GLOVE SUPERSENSE BIOGEL SZ 8 (GLOVE) ×1
GOWN STRL NON-REIN LRG LVL3 (GOWN DISPOSABLE) ×2 IMPLANT
GOWN STRL REIN XL XLG (GOWN DISPOSABLE) ×4 IMPLANT
KIT BASIN OR (CUSTOM PROCEDURE TRAY) ×2 IMPLANT
MESH ULTRAPRO 3X6 7.6X15CM (Mesh General) ×1 IMPLANT
PEN SKIN MARKING BROAD (MISCELLANEOUS) IMPLANT
SET IRRIG TUBING LAPAROSCOPIC (IRRIGATION / IRRIGATOR) IMPLANT
SUT MNCRL AB 4-0 PS2 18 (SUTURE) ×2 IMPLANT
TACKER 5MM HERNIA 3.5CML NAB (ENDOMECHANICALS) IMPLANT
TOWEL OR 17X26 10 PK STRL BLUE (TOWEL DISPOSABLE) ×2 IMPLANT
TRAY FOLEY CATH 14FRSI W/METER (CATHETERS) ×2 IMPLANT
TRAY LAP CHOLE (CUSTOM PROCEDURE TRAY) ×2 IMPLANT
TROCAR BLADELESS OPT 5 100 (ENDOMECHANICALS) ×1 IMPLANT
TUBING INSUFFLATION 10FT LAP (TUBING) ×2 IMPLANT

## 2011-08-09 NOTE — Anesthesia Procedure Notes (Signed)
Procedure Name: Intubation Date/Time: 08/09/2011 7:34 AM Performed by: Hulan Fess Pre-anesthesia Checklist: Patient identified, Emergency Drugs available, Suction available, Patient being monitored and Timeout performed Patient Re-evaluated:Patient Re-evaluated prior to inductionOxygen Delivery Method: Circle System Utilized Preoxygenation: Pre-oxygenation with 100% oxygen Intubation Type: IV induction Ventilation: Mask ventilation without difficulty Laryngoscope Size: Mac and 3 Grade View: Grade I Tube type: Oral Tube size: 8.0 mm Number of attempts: 1 Placement Confirmation: ETT inserted through vocal cords under direct vision Secured at: 22 cm Tube secured with: Tape Dental Injury: Teeth and Oropharynx as per pre-operative assessment

## 2011-08-09 NOTE — H&P (View-Only) (Signed)
Patient ID: Charles Trujillo, male   DOB: [birth year], 61 y.o.   MRN: [id number]  Chief Complaint  Patient presents with  . Follow-up    HPI Charles Trujillo is a 61 y.o. male. HPI Patient presents at the request of Dr. Laury Axon do to a bulge in his right groin. He was raking leaves 2 months ago  and began to develop some discomfort in the right groin. The pain was mild and sharp. Location was right groin. Exertion the pain worse. The pain improved with rest. He returns for preop visit.  No changes.  Past Medical History  Diagnosis Date  . Depression   . Hypertension   . Elevated LFTs     previously evaluated by Dr Jarold Motto  . Elevated glucose   . Diabetes mellitus   . Arthritis   . Inguinal hernia     right    Past Surgical History  Procedure Date  . Knee surgery     age 24- right  . Achilles tendon repair 1999  . Squamous cell carcinoma excision     left knee  . Knee arthroscopy 1993    left knee    Family History  Problem Relation Age of Onset  . Diabetes Mother   . Hypertension Mother   . Heart disease Mother   . Stroke Father 38  . Parkinsonism Father   . Depression Brother   . Alcohol abuse Brother   . Cancer Maternal Grandmother     bone    Social History History  Substance Use Topics  . Smoking status: Current Some Day Smoker    Types: Cigars  . Smokeless tobacco: Not on file  . Alcohol Use: 3.6 oz/week    6 Cans of beer per week    Allergies  Allergen Reactions  . Codeine Phosphate     REACTION: nausea    Current Outpatient Prescriptions  Medication Sig Dispense Refill  . celecoxib (CELEBREX) 200 MG capsule Take 1 capsule (200 mg total) by mouth daily.  30 capsule  5  . DULoxetine (CYMBALTA) 60 MG capsule       . glucose blood (ONE TOUCH TEST STRIPS) test strip 1 each by Other route 2 (two) times daily. accu check       . ibuprofen (ADVIL,MOTRIN) 200 MG tablet Take 400 mg by mouth every 6 (six) hours as needed. Pain      .  lisinopril-hydrochlorothiazide (PRINZIDE,ZESTORETIC) 20-12.5 MG per tablet Take 1 tablet by mouth daily before breakfast.      . metFORMIN (GLUCOPHAGE XR) 500 MG 24 hr tablet       . ONETOUCH DELICA LANCETS MISC 2 (two) times daily. accu check       . sildenafil (VIAGRA) 100 MG tablet Take 100 mg by mouth as needed.          Review of Systems Review of Systems  Constitutional: Negative for fever, chills and unexpected weight change.  HENT: Negative for hearing loss, congestion, sore throat, trouble swallowing and voice change.   Eyes: Negative for visual disturbance.  Respiratory: Negative for cough and wheezing.   Cardiovascular: Negative for chest pain, palpitations and leg swelling.  Gastrointestinal: Negative for nausea, vomiting, abdominal pain, diarrhea, constipation, blood in stool, abdominal distention, anal bleeding and rectal pain.  Genitourinary: Negative for hematuria and difficulty urinating.  Musculoskeletal: Negative for arthralgias.  Skin: Negative for rash and wound.  Neurological: Negative for seizures, syncope, weakness and headaches.  Hematological: Negative for adenopathy. Does not bruise/bleed  easily.  Psychiatric/Behavioral: Negative for confusion.    Blood pressure 142/90, pulse 88, temperature 98.6 F (37 C), resp. rate 12, height 5\' 7"  (1.702 m), weight 198 lb (89.812 kg).  Physical Exam Physical Exam  Constitutional: He is oriented to person, place, and time. He appears well-developed and well-nourished.  HENT:  Head: Normocephalic and atraumatic.  Eyes: Pupils are equal, round, and reactive to light.  Neck: Normal range of motion. Neck supple.  Cardiovascular: Normal rate and regular rhythm.   Pulmonary/Chest: Effort normal and breath sounds normal.  Abdominal: Soft. Bowel sounds are normal.  Genitourinary:       Small reducible right inguinal hernia. No evidence of left inguinal hernia.  Musculoskeletal: Normal range of motion.  Neurological: He is  alert and oriented to person, place, and time.    Data Reviewed   Assessment    Right inguinal hernia reducible    Plan    Treatment options were discussed today. I discussed observation of therapy versus surgical therapy. I discussed laparoscopic versus open repair with mesh. He elected per she was laparoscopic right inguinal hernia repair with mesh.The risk of hernia repair include bleeding,  Infection,   Recurrence of the hernia,  Mesh use, chronic pain,  Organ injury,  Bowel injury,  Bladder injury,   nerve injury with numbness around the incision,  Death,  and worsening of preexisting  medical problems.  The alternatives to surgery have been discussed as well..  Long term expectations of both operative and non operative treatments have been discussed.   The patient agrees to proceed.       Mercie Balsley A. 08/02/2011, 9:19 AM

## 2011-08-09 NOTE — Transfer of Care (Signed)
Immediate Anesthesia Transfer of Care Note  Patient: Charles Trujillo  Procedure(s) Performed:  LAPAROSCOPIC INGUINAL HERNIA  Patient Location: PACU  Anesthesia Type: General  Level of Consciousness: awake, alert  and oriented  Airway & Oxygen Therapy: Patient Spontanous Breathing and Patient connected to face mask oxygen  Post-op Assessment: Report given to PACU RN  Post vital signs: Reviewed and stable  Complications: No apparent anesthesia complications

## 2011-08-09 NOTE — Progress Notes (Signed)
Pt ambulated around unit and tolerated well.

## 2011-08-09 NOTE — Interval H&P Note (Signed)
History and Physical Interval Note:  08/09/2011 6:57 AM  Charles Trujillo  has presented today for surgery, with the diagnosis of right inguinal hernia  The various methods of treatment have been discussed with the patient and family. After consideration of risks, benefits and other options for treatment, the patient has consented to  Procedure(s): LAPAROSCOPIC INGUINAL HERNIA as a surgical intervention .  The patients' history has been reviewed, patient examined, no change in status, stable for surgery.  I have reviewed the patients' chart and labs.  Questions were answered to the patient's satisfaction.     Aymara Sassi A.

## 2011-08-09 NOTE — Op Note (Signed)
Preop diagnosis: Right inguinal hernia  Postop diagnosis: Same  Procedure: Laparoscopic repair of right inguinal hernia with ultra Pro mesh  Surgeon: Harriette Bouillon M.D.  Anesthesia: Gen. endotracheal anesthesia with 0.25% local consisting of bupivacaine with epinephrine  EBL: 50 cc  Specimen: None  Drains: None  Indications for procedure: The patient is a 61 year old male with a right inguinal hernia that is symptomatic. We discussed both open and laparoscopic repair techniques as well as the pros and cons of each. The risks of each were discussed and non-operative alternatives were discussed. He voices understanding and wished to proceed with laparoscopic repair of his right inguinal hernia.The risk of hernia repair include bleeding,  Infection,   Recurrence of the hernia,  Mesh use, chronic pain,  Organ injury,  Bowel injury,  Bladder injury,   nerve injury with numbness around the incision,  Death,  and worsening of preexisting  medical problems.  The alternatives to surgery have been discussed as well..  Long term expectations of both operative and non operative treatments have been discussed.   The patient agrees to proceed.  Description of procedure: The patient was seen in the holding area in the right inguinal region was initial. Questions are answered. The patient was taken to the operating room and placed supine. After induction of general endotracheal esthesia, a Foley catheter was placed under sterile conditions. He received Kefzol 1 g. After the abdomen was prepped and draped in a sterile fashion, a timeout was performed. This a 1 cm incision was made below the umbilicus and the fascia of the right rectus muscle was identified and opened. The fibers were swept laterally. The posterior sheath was identified and the space maker balloon trocar was slid under the right rectus muscle toward the pubis. Under direct vision the balloon was expanded without difficulty. The balloon was removed  and the space was insufflated to 12 mm of pressure. 25 mm ports are placed in the midline under direct vision. Upon inspection there is no evidence of a left inguinal hernia. The patient had an indirect right inguinal hernia. The hernia sac was dissected away from the cord structures bluntly. There was a small rent in the hernia sac. This causes the insufflation to go into the intraperitoneal space making work in the pre-peritoneal space very difficult. I elected to convert to a transabdominal approach for better visualization. At the umbilicus I opened a posterior sheath and inserted my trocar under direct vision into the peritoneal space without difficulty. Pressure was increased to 15 mm of mercury and the patient was placed in Trendelenburg. A replaced the two 5 mm ports and placed in in the intraperitoneal space.An extra 5 mm port was placed in the right mid abdomen.  This cautery was used to open the preperitoneal space. I was able to finish dissecting the peritoneum off the cord structures. The pubis and Cooper's ligament were dissected out. The space anterior to the bladder was bluntly dissected out carefully. This was an indirect hernia with no evidence of direct component. This a 3" x 6" piece of per mesh was placed and secured to Cooper's ligament with an absorbable tacker. The mesh laid nicely in cover both direct and indirect regions without redundancy. I placed additional tacks medially, and superiorly, but avoided placing tacks laterally. I then secured the peritoneum over the mesh insult no evidence of any defect by using the tacker. Hemostasis was excellent. Laparoscopy revealed no injury to the internal viscera. We didn't deinsufflated the abdominal cavity. Are  ports were removed the fascia was closed with 0 Vicryl. 4-0 Monocryl was used to close the skin. Dermabond was applied. All final counts were found to be correct. The patient was awoke taken to recovery in satisfactory condition. The Foley  catheter is removed there is no blood in the urine.

## 2011-08-09 NOTE — Anesthesia Preprocedure Evaluation (Addendum)
Anesthesia Evaluation  Patient identified by MRN, date of birth, ID band Patient awake    Reviewed: Allergy & Precautions, H&P , NPO status , Patient's Chart, lab work & pertinent test results  Airway Mallampati: II TM Distance: >3 FB Neck ROM: Full    Dental No notable dental hx.    Pulmonary neg pulmonary ROS,  clear to auscultation  Pulmonary exam normal       Cardiovascular hypertension, Regular Normal    Neuro/Psych PSYCHIATRIC DISORDERS Depression  Neuromuscular disease    GI/Hepatic negative GI ROS, Neg liver ROS,   Endo/Other  Negative Endocrine ROSDiabetes mellitus-, Type 2, Oral Hypoglycemic Agents  Renal/GU negative Renal ROS  Genitourinary negative   Musculoskeletal negative musculoskeletal ROS (+)   Abdominal   Peds negative pediatric ROS (+)  Hematology negative hematology ROS (+)   Anesthesia Other Findings   Reproductive/Obstetrics negative OB ROS                          Anesthesia Physical Anesthesia Plan  ASA: III  Anesthesia Plan: General   Post-op Pain Management:    Induction: Intravenous  Airway Management Planned: Oral ETT  Additional Equipment:   Intra-op Plan:   Post-operative Plan: Extubation in OR  Informed Consent: I have reviewed the patients History and Physical, chart, labs and discussed the procedure including the risks, benefits and alternatives for the proposed anesthesia with the patient or authorized representative who has indicated his/her understanding and acceptance.   Dental advisory given  Plan Discussed with: CRNA  Anesthesia Plan Comments:         Anesthesia Quick Evaluation

## 2011-08-09 NOTE — Anesthesia Postprocedure Evaluation (Signed)
  Anesthesia Post-op Note  Patient: Charles Trujillo  Procedure(s) Performed:  LAPAROSCOPIC INGUINAL HERNIA  Patient Location: PACU  Anesthesia Type: General  Level of Consciousness: awake and alert   Airway and Oxygen Therapy: Patient Spontanous Breathing  Post-op Pain: mild  Post-op Assessment: Post-op Vital signs reviewed, Patient's Cardiovascular Status Stable, Respiratory Function Stable, Patent Airway and No signs of Nausea or vomiting  Post-op Vital Signs: stable  Complications: No apparent anesthesia complications

## 2011-08-13 ENCOUNTER — Encounter (HOSPITAL_COMMUNITY): Payer: Self-pay | Admitting: Surgery

## 2011-08-23 ENCOUNTER — Encounter (INDEPENDENT_AMBULATORY_CARE_PROVIDER_SITE_OTHER): Payer: Self-pay | Admitting: Surgery

## 2011-08-23 ENCOUNTER — Ambulatory Visit (INDEPENDENT_AMBULATORY_CARE_PROVIDER_SITE_OTHER): Payer: Commercial Managed Care - PPO | Admitting: Surgery

## 2011-08-23 VITALS — BP 154/92 | HR 120 | Temp 97.4°F | Resp 16 | Ht 67.0 in | Wt 192.2 lb

## 2011-08-23 DIAGNOSIS — Z9889 Other specified postprocedural states: Secondary | ICD-10-CM

## 2011-08-23 NOTE — Progress Notes (Signed)
Patient returned after laparoscopic repair of right lower hernia. He is doing well except for fatigue.  Exam: Both anal canal examined. No evidence of hernia. Faint redness around periumbilical skin which is improving.  Impression: Status post laparoscopic repair of retinal hernia  Plan: Return to work week from Monday, September 02, 2011 light-duty then full duty September 09, 2011. Followup p.r.n.

## 2011-08-23 NOTE — Patient Instructions (Signed)
See sheet for return to work.

## 2011-08-30 ENCOUNTER — Encounter (INDEPENDENT_AMBULATORY_CARE_PROVIDER_SITE_OTHER): Payer: Commercial Managed Care - PPO | Admitting: Surgery

## 2011-09-02 ENCOUNTER — Telehealth: Payer: Self-pay

## 2011-09-02 NOTE — Telephone Encounter (Signed)
FYI----Sample of Celebrex requested from wife, she stated he had not received his medications yet-- 4 sample packs given of Celebrex 200 mg.     KP

## 2011-09-02 NOTE — Telephone Encounter (Signed)
Ok to give samples

## 2011-09-06 ENCOUNTER — Other Ambulatory Visit: Payer: Self-pay | Admitting: Family Medicine

## 2011-10-14 ENCOUNTER — Telehealth: Payer: Self-pay | Admitting: Family Medicine

## 2011-10-14 NOTE — Telephone Encounter (Signed)
-----  790.4 250.00 272.4 401.9 Lipid, hep, bmp, hgba1c , microalbumin

## 2011-10-14 NOTE — Telephone Encounter (Signed)
Patient stats he was told to come in for fasting labs. I do not see any orders can you review his chart & advise & I will call him back regarding a lab appointment Patient ph# [phone]

## 2011-10-14 NOTE — Telephone Encounter (Signed)
Called patient back 341pm 4.1.13 LVMOM

## 2011-10-16 ENCOUNTER — Other Ambulatory Visit (INDEPENDENT_AMBULATORY_CARE_PROVIDER_SITE_OTHER): Payer: 59

## 2011-10-16 ENCOUNTER — Other Ambulatory Visit: Payer: Self-pay | Admitting: Family Medicine

## 2011-10-16 DIAGNOSIS — E785 Hyperlipidemia, unspecified: Secondary | ICD-10-CM

## 2011-10-16 DIAGNOSIS — I10 Essential (primary) hypertension: Secondary | ICD-10-CM

## 2011-10-16 DIAGNOSIS — E119 Type 2 diabetes mellitus without complications: Secondary | ICD-10-CM

## 2011-10-16 DIAGNOSIS — R7402 Elevation of levels of lactic acid dehydrogenase (LDH): Secondary | ICD-10-CM

## 2011-10-16 LAB — BASIC METABOLIC PANEL
CO2: 28 mEq/L (ref 19–32)
Calcium: 9.1 mg/dL (ref 8.4–10.5)
Creatinine, Ser: 1.1 mg/dL (ref 0.4–1.5)
GFR: 75.17 mL/min (ref >60.00)
Glucose, Bld: 133 mg/dL — ABNORMAL HIGH (ref 70–99)

## 2011-10-16 LAB — LIPID PANEL
HDL: 69 mg/dL (ref >39.00)
Total CHOL/HDL Ratio: 3
VLDL: 9.2 mg/dL (ref 0.0–40.0)

## 2011-10-16 LAB — HEPATIC FUNCTION PANEL
AST: 39 U/L — ABNORMAL HIGH (ref 0–37)
Alkaline Phosphatase: 47 U/L (ref 39–117)
Bilirubin, Direct: 0.1 mg/dL (ref 0.0–0.3)
Total Protein: 6.8 g/dL (ref 6.0–8.3)

## 2011-10-16 LAB — HEMOGLOBIN A1C: Hgb A1c MFr Bld: 6.3 % (ref 4.6–6.5)

## 2011-10-16 LAB — MICROALBUMIN / CREATININE URINE RATIO: Microalb, Ur: 1.2 mg/dL (ref 0.0–1.9)

## 2011-10-25 MED ORDER — ATORVASTATIN CALCIUM 20 MG PO TABS: 20.0000 mg | ORAL_TABLET | Freq: Every day | ORAL | Status: DC

## 2011-11-19 ENCOUNTER — Other Ambulatory Visit: Payer: Self-pay | Admitting: Family Medicine

## 2011-12-04 ENCOUNTER — Encounter: Payer: Self-pay | Admitting: Family Medicine

## 2012-01-22 ENCOUNTER — Other Ambulatory Visit: Payer: Self-pay | Admitting: Family Medicine

## 2012-01-25 ENCOUNTER — Other Ambulatory Visit: Payer: Self-pay | Admitting: Family Medicine

## 2012-02-12 ENCOUNTER — Telehealth: Payer: Self-pay | Admitting: Family Medicine

## 2012-02-12 MED ORDER — ATORVASTATIN CALCIUM 20 MG PO TABS: 20.0000 mg | ORAL_TABLET | Freq: Every day | ORAL | Status: DC

## 2012-02-12 NOTE — Telephone Encounter (Signed)
Refill: Atorvastatin 20mg  tablet. Take 1 tablet once daily. Qty 30. Last fill 01-06-12

## 2012-03-02 ENCOUNTER — Other Ambulatory Visit: Payer: Self-pay | Admitting: Family Medicine

## 2012-03-06 ENCOUNTER — Ambulatory Visit (INDEPENDENT_AMBULATORY_CARE_PROVIDER_SITE_OTHER): Payer: 59 | Admitting: Family Medicine

## 2012-03-06 ENCOUNTER — Encounter: Payer: Self-pay | Admitting: Family Medicine

## 2012-03-06 VITALS — BP 136/84 | HR 121 | Temp 98.0°F | Ht 67.25 in | Wt 198.8 lb

## 2012-03-06 DIAGNOSIS — R000 Tachycardia, unspecified: Secondary | ICD-10-CM

## 2012-03-06 DIAGNOSIS — I4892 Unspecified atrial flutter: Secondary | ICD-10-CM

## 2012-03-06 LAB — CBC WITH DIFFERENTIAL/PLATELET
Eosinophils Relative: 4 % (ref 0–5)
Lymphocytes Relative: 24 % (ref 12–46)
Lymphs Abs: 1.7 10*3/uL (ref 0.7–4.0)
MCV: 92.5 fL (ref 78.0–100.0)
Neutrophils Relative %: 61 % (ref 43–77)
Platelets: 258 10*3/uL (ref 150–400)
RBC: 4.79 MIL/uL (ref 4.22–5.81)
WBC: 7.2 10*3/uL (ref 4.0–10.5)

## 2012-03-06 LAB — BASIC METABOLIC PANEL
BUN: 20 mg/dL (ref 6–23)
Potassium: 4.7 mEq/L (ref 3.5–5.3)

## 2012-03-06 LAB — TSH: TSH: 3.083 u[IU]/mL (ref 0.350–4.500)

## 2012-03-06 MED ORDER — DILTIAZEM HCL ER 120 MG PO CP24: 120.0000 mg | ORAL_CAPSULE | Freq: Every day | ORAL | Status: DC

## 2012-03-06 NOTE — Patient Instructions (Addendum)
This is Atrial Flutter/Fib We'll notify you of your lab results You have a cardiology appt on Thursday 8/29 at 8:30am Start the Diltiazem daily Start Aspirin 325mg  daily If you have chest pain, shortness of breath, or other concerns- go to the ER Hang in there!

## 2012-03-06 NOTE — Progress Notes (Signed)
  Subjective:    Patient ID: Charles Trujillo, male    DOB: [birth year], 61 y.o.   MRN: [id number]  HPI Tachycardia- pt reports sxs x5-6 months.  No CP, SOB.  + diaphoresis but this has been present for several years and seems to correspond w/ Cymbalta.  Denies irregularity or palpitations.  Wife felt heart was regularly irregular last night.  No syncope.   Review of Systems For ROS see HPI     Objective:   Physical Exam  Constitutional: He is oriented to person, place, and time. He appears well-developed and well-nourished. No distress.  HENT:  Head: Normocephalic and atraumatic.  Eyes: Conjunctivae and EOM are normal. Pupils are equal, round, and reactive to light.  Neck: Normal range of motion. Neck supple. No thyromegaly present.  Cardiovascular: Regular rhythm, normal heart sounds and intact distal pulses.   No murmur heard.      Tachy but regular S1/S2  Pulmonary/Chest: Effort normal and breath sounds normal. No respiratory distress.  Abdominal: Soft. Bowel sounds are normal. He exhibits no distension.  Musculoskeletal: He exhibits no edema.  Lymphadenopathy:    He has no cervical adenopathy.  Neurological: He is alert and oriented to person, place, and time. No cranial nerve deficit.  Skin: Skin is warm and dry.  Psychiatric: He has a normal mood and affect. His behavior is normal.          Assessment & Plan:

## 2012-03-10 NOTE — Assessment & Plan Note (Signed)
New to provider but pt reports sxs x6 months.  EKG in office shows atrial flutter- see above.

## 2012-03-10 NOTE — Assessment & Plan Note (Signed)
New.  Determined by today's EKG.  Pt's irregular heartbeat from last night as noted by wife Banker) was likely Afib.  Called cards and spoke to doc of the day and it was recommended to start Dilt to control HR.  Will get labs to r/o possible metabolic cause.  Get ECHO to assess structural fxn.  Inquired about anticoag w/ novel anticoagulant (eloquis, xaralto, etc) and this was not recommended at this time.  Will start regular ASA daily.  Referred to cards for next week.  Reviewed supportive care and red flags that should prompt return.  Pt expressed understanding and is in agreement w/ plan.

## 2012-03-11 ENCOUNTER — Encounter: Payer: Self-pay | Admitting: *Deleted

## 2012-03-11 ENCOUNTER — Ambulatory Visit (HOSPITAL_COMMUNITY)
Admission: RE | Admit: 2012-03-11 | Discharge: 2012-03-11 | Disposition: A | Discharge status: Home / Self Care | Payer: 59 | Source: Ambulatory Visit | Attending: Family Medicine | Admitting: Family Medicine

## 2012-03-11 DIAGNOSIS — I10 Essential (primary) hypertension: Secondary | ICD-10-CM | POA: Insufficient documentation

## 2012-03-11 DIAGNOSIS — I4891 Unspecified atrial fibrillation: Secondary | ICD-10-CM

## 2012-03-11 DIAGNOSIS — I079 Rheumatic tricuspid valve disease, unspecified: Secondary | ICD-10-CM | POA: Insufficient documentation

## 2012-03-11 DIAGNOSIS — I4892 Unspecified atrial flutter: Secondary | ICD-10-CM

## 2012-03-11 DIAGNOSIS — E119 Type 2 diabetes mellitus without complications: Secondary | ICD-10-CM | POA: Insufficient documentation

## 2012-03-11 NOTE — Progress Notes (Signed)
  Echocardiogram 2D Echocardiogram has been performed.  Jerran Tappan 03/11/2012, 11:06 AM

## 2012-03-12 ENCOUNTER — Encounter: Payer: Self-pay | Admitting: Physician Assistant

## 2012-03-12 ENCOUNTER — Ambulatory Visit (INDEPENDENT_AMBULATORY_CARE_PROVIDER_SITE_OTHER): Payer: 59 | Admitting: Physician Assistant

## 2012-03-12 ENCOUNTER — Telehealth: Payer: Self-pay | Admitting: *Deleted

## 2012-03-12 VITALS — BP 124/70 | HR 99 | Ht 68.0 in | Wt 198.0 lb

## 2012-03-12 DIAGNOSIS — R000 Tachycardia, unspecified: Secondary | ICD-10-CM

## 2012-03-12 DIAGNOSIS — I10 Essential (primary) hypertension: Secondary | ICD-10-CM

## 2012-03-12 DIAGNOSIS — R0602 Shortness of breath: Secondary | ICD-10-CM

## 2012-03-12 DIAGNOSIS — E785 Hyperlipidemia, unspecified: Secondary | ICD-10-CM

## 2012-03-12 DIAGNOSIS — E119 Type 2 diabetes mellitus without complications: Secondary | ICD-10-CM

## 2012-03-12 LAB — BRAIN NATRIURETIC PEPTIDE: Pro B Natriuretic peptide (BNP): 6 pg/mL (ref 0.0–100.0)

## 2012-03-12 MED ORDER — ASPIRIN EC 81 MG PO TBEC: 81.0000 mg | DELAYED_RELEASE_TABLET | Freq: Every day | ORAL | Status: DC

## 2012-03-12 NOTE — Progress Notes (Addendum)
[address] [place], [place]  [address] Phone: [phone] Fax:  [phone]  Date:  03/12/2012   Name:  Charles Trujillo   DOB:  10-Sep-1950   MRN:  [id number]  PCP:  Loreen Freud, DO  Primary Cardiologist:  New to [facility] (Dr. Rollene Rotunda)   History of Present Illness: Charles Trujillo is a 61 y.o. male who is referred by his PCP for evaluation of atrial flutter. He has a history of DM2, HTN, HL.  He is an ex-smoker. He continues to smoke cigars. He his mother had heart disease which included CAD as well as congestive heart failure. He is here with his wife today who is a Engineer, civil (consulting) with  [employer] Lar[employer]).  Over the last 6 months, he has noted dyspnea with exertion. He experiences this with more extreme activities such as pulling a trash can up a driveway which is a 45 incline as well as push mowing his yard or lifting something heavy. He denies chest discomfort. He denies arm or jaw discomfort. He does have associated diaphoresis but no associated nausea or syncope. He is felt an elevated heart rate for the last several months. He denies episodic palpitations. He presented to his PCPs office recently and was diagnosed with atrial flutter. He was placed on diltiazem.  Echocardiogram 03/11/12: EF 55-60%, grade 1 diastolic dysfunction.  Wt Readings from Last 3 Encounters:  03/12/12 198 lb (89.812 kg)  03/06/12 198 lb 12.8 oz (90.175 kg)  08/23/11 192 lb 3.2 oz (87.181 kg)     Past Medical History  Diagnosis Date  . Depression   . Hypertension   . Elevated LFTs     previously evaluated by Dr Jarold Motto; h/o "fatty liver"  . DM2 (diabetes mellitus, type 2)   . Inguinal hernia     right  . Arthritis     bilateral knees  . HLD (hyperlipidemia)     Past Surgical History  Procedure Date  . Knee surgery     age 2- right  . Achilles tendon repair 1999  . Squamous cell carcinoma excision     left knee  . Knee arthroscopy 1993    left  knee  . Inguinal hernia repair 08/09/2011    Procedure: LAPAROSCOPIC INGUINAL HERNIA;  Surgeon: Clovis Pu. Cornett, MD;  Location: [facility];  Service: General;  Laterality: Right;     Current Outpatient Prescriptions  Medication Sig Dispense Refill  . atorvastatin (LIPITOR) 20 MG tablet Take 1 tablet (20 mg total) by mouth daily.  30 tablet  0  . celecoxib (CELEBREX) 200 MG capsule Take 1 capsule (200 mg total) by mouth daily.  30 capsule  5  . CYMBALTA 60 MG capsule TAKE (1) CAPSULE DAILY.  30 each  1  . diltiazem (DILACOR XR) 120 MG 24 hr capsule Take 1 capsule (120 mg total) by mouth daily.  30 capsule  3  . glucose blood (ONE TOUCH TEST STRIPS) test strip 1 each by Other route 2 (two) times daily. accu check       . ibuprofen (ADVIL,MOTRIN) 200 MG tablet Take 400 mg by mouth every 6 (six) hours as needed. Pain       . metFORMIN (GLUCOPHAGE XR) 500 MG 24 hr tablet Take 2 tablets by mouth daily--Labs are due now  60 each  0  . ONETOUCH DELICA LANCETS MISC 2 (two) times daily. accu check       . sildenafil (VIAGRA) 100 MG tablet Take  100 mg by mouth as needed.       Marland Kitchen ZESTORETIC 20-12.5 MG per tablet TAKE (1) TABLET DAILY.  30 each  1  . aspirin EC 81 MG tablet Take 1 tablet (81 mg total) by mouth daily.        Allergies: Allergies  Allergen Reactions  . Codeine Phosphate     REACTION: nausea    History  Substance Use Topics  . Smoking status: Current Some Day Smoker    Types: Cigars  . Smokeless tobacco: Not on file   Comment: stopped smoking cigars 2 weeks before surgery  . Alcohol Use: 3.6 oz/week    6 Cans of beer per week     Family History  Problem Relation Age of Onset  . Diabetes Mother   . Hypertension Mother   . Heart disease Mother   . Stroke Father 95  . Parkinsonism Father   . Depression Brother   . Alcohol abuse Brother   . Cancer Maternal Grandmother     bone  . Heart failure Mother     ROS:  Please see the history of present illness.     All other  systems reviewed and negative.   PHYSICAL EXAM: VS:  BP 124/70  Pulse 99  Ht 5\' 8"  (1.727 m)  Wt 198 lb (89.812 kg)  BMI 30.11 kg/m2 Well nourished, well developed, in no acute distress HEENT: normal Neck: no JVD Endocrine: no thyromegaly Vascular: no carotid bruits; DP/PT 2+ bilaterally Cardiac:  normal S1, S2; RRR; no murmur Lungs:  clear to auscultation bilaterally, no wheezing, rhonchi or rales Abd: soft, nontender, no hepatomegaly Ext: no edema Skin: warm and dry Neuro:  CNs 2-12 intact, no focal abnormalities noted  EKG:   sinus rhythm, heart rate 99, normal axis, no acute changes EKG from 03/06/12 reviewed and demonstrates sinus tachycardia. No atrial flutter is identified.     Potassium  Date/Time Value Range Status  03/06/2012  5:03 PM 4.7  3.5 - 5.3 mEq/L Final   Creat  Date/Time Value Range Status  03/06/2012  5:03 PM 0.96  0.50 - 1.35 mg/dL Final     Creatinine, Ser  Date/Time Value Range Status  10/16/2011  1:49 PM 1.1  0.4 - 1.5 mg/dL Final   Hemoglobin  Date/Time Value Range Status  03/06/2012  5:03 PM 15.3  13.0 - 17.0 g/dL Final   TSH  Date/Time Value Range Status  03/06/2012  5:03 PM 3.083  0.350 - 4.500 uIU/mL Final   ASSESSMENT AND PLAN:  1.  Tachycardia: No evidence of atrial flutter or atrial fibrillation is apparent at this time. His heart rate is improved on diltiazem and he does feel somewhat better. He has several risk factors for CAD as well as symptoms of dyspnea with exertion. Continue diltiazem. Remain on aspirin 81 mg daily. Proceed with ETT-Myoview to rule out ischemia. Followup with Dr. Antoine Poche in 2-3 weeks. If his stress test is normal, consider proceeding with an event monitor.  2. Dyspnea: Normal echocardiogram recently. Proceed with stress testing as noted. Diastolic dysfunction noted on echo. However, his exam is not consistent with CHF. Check a BNP today. Patient also interviewed and examined by Dr. Antoine Poche today.  3. Hypertension:  Controlled.  4. Hyperlipidemia: Continue statin.   5. Diabetes: Continue followup with primary care.  Signed, Tereso Newcomer, PA-C  9:31 AM 03/12/2012     History and all data above reviewed.  Patient examined.  I agree with the findings as above.  The patient has had dyspnea with tachycardia.  However the computer reading of the EKG was incorrect and there is no atrial flutter.  The patient exam reveals COR:RRR  ,  Lungs: Clear  ,  Abd: Positive bowel sounds, no rebound no guarding, Ext No edema  .  All available labs, radiology testing, previous records reviewed. Agree with documented assessment and plan. We will schedule a stress perfusion study to evaluate the dyspnea and tachycardia.  Echo was unremarkable and TSH normal.  Further evaluation will be based on these results.    Rollene Rotunda  03/12/29

## 2012-03-12 NOTE — Telephone Encounter (Signed)
Message copied by Tarri Fuller on Thu Mar 12, 2012  3:12 PM ------      Message from: [place], [place] T      Created: Thu Mar 12, 2012  1:58 PM       Normal      Tereso Newcomer, [place]  1:57 PM 03/12/2012

## 2012-03-12 NOTE — Telephone Encounter (Signed)
lmom lab ok 

## 2012-03-12 NOTE — Patient Instructions (Addendum)
Your physician has requested that you have en exercise stress myoview DX 785.0, 786.05. For further information please visit [email or web address] Please follow instruction sheet, as given.  Your physician has recommended you make the following change in your medication: START ASPIRIN 81 MG DAILY  Your physician recommends that you schedule a follow-up appointment in: APPROX 2-3 WEEKS WITH DR. HOCHREIN PER DR. HOCHREIN  bnp today

## 2012-03-13 NOTE — Addendum Note (Signed)
Addended by: Rollene Rotunda on: 03/13/2012 08:35 PM   Modules accepted: Level of Service

## 2012-03-17 ENCOUNTER — Ambulatory Visit (HOSPITAL_COMMUNITY): Payer: 59 | Attending: Cardiology | Admitting: Radiology

## 2012-03-17 VITALS — BP 121/73 | Ht 68.0 in | Wt 194.0 lb

## 2012-03-17 DIAGNOSIS — R0609 Other forms of dyspnea: Secondary | ICD-10-CM | POA: Insufficient documentation

## 2012-03-17 DIAGNOSIS — R0989 Other specified symptoms and signs involving the circulatory and respiratory systems: Secondary | ICD-10-CM | POA: Insufficient documentation

## 2012-03-17 DIAGNOSIS — R0602 Shortness of breath: Secondary | ICD-10-CM

## 2012-03-17 DIAGNOSIS — E119 Type 2 diabetes mellitus without complications: Secondary | ICD-10-CM | POA: Insufficient documentation

## 2012-03-17 DIAGNOSIS — R002 Palpitations: Secondary | ICD-10-CM | POA: Insufficient documentation

## 2012-03-17 DIAGNOSIS — F17200 Nicotine dependence, unspecified, uncomplicated: Secondary | ICD-10-CM | POA: Insufficient documentation

## 2012-03-17 DIAGNOSIS — R000 Tachycardia, unspecified: Secondary | ICD-10-CM

## 2012-03-17 DIAGNOSIS — I10 Essential (primary) hypertension: Secondary | ICD-10-CM | POA: Insufficient documentation

## 2012-03-17 MED ORDER — TECHNETIUM TC 99M TETROFOSMIN IV KIT
33.0000 | PACK | Freq: Once | INTRAVENOUS | Status: AC | PRN
  Administered 2012-03-17: 33 via INTRAVENOUS

## 2012-03-17 MED ORDER — TECHNETIUM TC 99M TETROFOSMIN IV KIT
11.0000 | PACK | Freq: Once | INTRAVENOUS | Status: AC | PRN
  Administered 2012-03-17: 11 via INTRAVENOUS

## 2012-03-17 NOTE — Progress Notes (Signed)
[facility] SITE 3 NUCLEAR MED [address] [place] [address] [phone]  Cardiology Nuclear Med Study  Charles Trujillo is a 61 y.o. male     MRN : [id number]     DOB: 1951-05-09  Procedure Date: 03/17/2012  Nuclear Med Background Indication for Stress Test:  Evaluation for Ischemia and Abnormal EKG History:  03/11/12 ECHO: EF: 55-60%  Cardiac Risk Factors: Hypertension, Lipids, NIDDM and Smoker  Symptoms:  DOE and Palpitations   Nuclear Pre-Procedure Caffeine/Decaff Intake:  None > 12 hrs NPO After: 8:00am   Lungs:  clear O2 Sat: 96% on room air. IV 0.9% NS with Angio Cath:  22g  IV Site: R Antecubital  X 1 , tolerated well IV Started by:  Irean Hong, RN  Chest Size (in):  42 Cup Size: n/a  Height: 5\' 8"  (1.727 m)  Weight:  194 lb (87.998 kg)  BMI:  Body mass index is 29.50 kg/(m^2). Tech Comments:  n/a    Nuclear Med Study 1 or 2 day study: 1 day  Stress Test Type:  Stress  Reading MD: Olga Millers, MD  Order Authorizing Provider:  Rollene Rotunda, MD, and Tereso Newcomer, [facility]  Resting Radionuclide: Technetium 38m Tetrofosmin  Resting Radionuclide Dose: 11.0 mCi   Stress Radionuclide:  Technetium 102m Tetrofosmin  Stress Radionuclide Dose: 33.0 mCi           Stress Protocol Rest HR: 82 Stress HR: 168  Rest BP: 121/73 Stress BP: 202/62  Exercise Time (min): 4:30 METS: 6.40   Predicted Max HR: 158 bpm % Max HR: 106.33 bpm Rate Pressure Product: 25366   Dose of Adenosine (mg):  n/a Dose of Lexiscan: n/a mg  Dose of Atropine (mg): n/a Dose of Dobutamine: n/a mcg/kg/min (at max HR)  Stress Test Technologist: Milana Na, EMT-P  Nuclear Technologist:  Domenic Polite, CNMT     Rest Procedure:  Myocardial perfusion imaging was performed at rest 45 minutes following the intravenous administration of Technetium 48m Tetrofosmin. Rest ECG: NSR - Normal EKG  Stress Procedure:  The patient performed treadmill exercise using a Bruce  Protocol  for 4:30 minutes. The patient stopped due to fatigue and denied any chest pain.  There were no significant ST-T wave changes, sob, and occ pacs in recovery.  Technetium 71m Tetrofosmin was injected at peak exercise and myocardial perfusion imaging was performed after a brief delay. Stress ECG: No significant change from baseline ECG  QPS Raw Data Images:  Acquisition technically good; normal left ventricular size. Stress Images:  Normal homogeneous uptake in all areas of the myocardium. Rest Images:  Normal homogeneous uptake in all areas of the myocardium. Subtraction (SDS):  No evidence of ischemia. Transient Ischemic Dilatation (Normal <1.22):  0.81 Lung/Heart Ratio (Normal <0.45):  0.30  Quantitative Gated Spect Images QGS EDV:  48 ml QGS ESV:  11 ml  Impression Exercise Capacity:  Poor exercise capacity. BP Response:  Hypertensive blood pressure response. Clinical Symptoms:  There is dyspnea. ECG Impression:  No significant ST segment change suggestive of ischemia. Comparison with Prior Nuclear Study: No images to compare  Overall Impression:  Normal stress nuclear study.  LV Ejection Fraction: 77%.  LV Wall Motion:  NL LV Function; NL Wall Motion  Olga Millers

## 2012-03-18 ENCOUNTER — Telehealth: Payer: Self-pay | Admitting: *Deleted

## 2012-03-18 NOTE — Telephone Encounter (Signed)
Message copied by Tarri Fuller on Wed Mar 18, 2012  4:39 PM ------      Message from: [place], [place] T      Created: Wed Mar 18, 2012  2:09 PM       Please inform patient stress test normal.      Tereso Newcomer, PA-C  2:08 PM 03/18/2012

## 2012-03-18 NOTE — Telephone Encounter (Signed)
lmom stress test normal 

## 2012-03-20 ENCOUNTER — Other Ambulatory Visit: Payer: Self-pay | Admitting: Family Medicine

## 2012-03-23 NOTE — Telephone Encounter (Signed)
Refill done.  

## 2012-03-26 ENCOUNTER — Ambulatory Visit: Payer: 59 | Admitting: Cardiology

## 2012-04-02 ENCOUNTER — Other Ambulatory Visit: Payer: Self-pay | Admitting: Family Medicine

## 2012-04-03 ENCOUNTER — Encounter: Payer: Self-pay | Admitting: Family Medicine

## 2012-04-03 ENCOUNTER — Ambulatory Visit (INDEPENDENT_AMBULATORY_CARE_PROVIDER_SITE_OTHER): Payer: 59 | Admitting: Family Medicine

## 2012-04-03 VITALS — BP 140/90 | HR 95 | Temp 98.5°F | Ht 68.0 in | Wt 200.0 lb

## 2012-04-03 DIAGNOSIS — E785 Hyperlipidemia, unspecified: Secondary | ICD-10-CM

## 2012-04-03 DIAGNOSIS — I4892 Unspecified atrial flutter: Secondary | ICD-10-CM | POA: Insufficient documentation

## 2012-04-03 DIAGNOSIS — Z0000 Encounter for general adult medical examination without abnormal findings: Secondary | ICD-10-CM

## 2012-04-03 DIAGNOSIS — I10 Essential (primary) hypertension: Secondary | ICD-10-CM

## 2012-04-03 DIAGNOSIS — E119 Type 2 diabetes mellitus without complications: Secondary | ICD-10-CM

## 2012-04-03 LAB — POCT URINALYSIS DIPSTICK
Bilirubin, UA: NEGATIVE
Blood, UA: NEGATIVE
Glucose, UA: NEGATIVE
Spec Grav, UA: >=1.03

## 2012-04-03 LAB — LIPID PANEL
Cholesterol: 166 mg/dL (ref 0–200)
LDL Cholesterol: 89 mg/dL (ref 0–99)
Triglycerides: 39 mg/dL (ref 0.0–149.0)

## 2012-04-03 LAB — HEPATIC FUNCTION PANEL
ALT: 51 U/L (ref 0–53)
Total Bilirubin: 0.8 mg/dL (ref 0.3–1.2)
Total Protein: 6.8 g/dL (ref 6.0–8.3)

## 2012-04-03 LAB — HEMOGLOBIN A1C: Hgb A1c MFr Bld: 6.3 % (ref 4.6–6.5)

## 2012-04-03 LAB — CBC WITH DIFFERENTIAL/PLATELET
Eosinophils Absolute: 0.4 10*3/uL (ref 0.0–0.7)
Eosinophils Relative: 6.1 % — ABNORMAL HIGH (ref 0.0–5.0)
HCT: 43.6 % (ref 39.0–52.0)
Lymphs Abs: 1.3 10*3/uL (ref 0.7–4.0)
MCHC: 33.3 g/dL (ref 30.0–36.0)
MCV: 96.3 fl (ref 78.0–100.0)
Monocytes Absolute: 0.5 10*3/uL (ref 0.1–1.0)
Platelets: 236 10*3/uL (ref 150.0–400.0)
RDW: 13.5 % (ref 11.5–14.6)
WBC: 5.9 10*3/uL (ref 4.5–10.5)

## 2012-04-03 LAB — MICROALBUMIN / CREATININE URINE RATIO
Creatinine,U: 248.8 mg/dL
Microalb Creat Ratio: 0.5 mg/g (ref 0.0–30.0)

## 2012-04-03 LAB — PSA: PSA: 0.33 ng/mL (ref 0.10–4.00)

## 2012-04-03 LAB — BASIC METABOLIC PANEL
Calcium: 9.3 mg/dL (ref 8.4–10.5)
GFR: 80.27 mL/min (ref >60.00)
Sodium: 137 mEq/L (ref 135–145)

## 2012-04-03 MED ORDER — ATORVASTATIN CALCIUM 20 MG PO TABS: 20.0000 mg | ORAL_TABLET | Freq: Every day | ORAL | Status: DC

## 2012-04-03 MED ORDER — LISINOPRIL-HYDROCHLOROTHIAZIDE 20-12.5 MG PO TABS: 1.0000 | ORAL_TABLET | Freq: Every day | ORAL | Status: DC

## 2012-04-03 NOTE — Patient Instructions (Addendum)
Preventive Care for Adults, Male A healthy lifestyle and preventive care can promote health and wellness. Preventive health guidelines for women include the following key practices.  A routine yearly physical is a good way to check with your caregiver about your health and preventive screening. It is a chance to share any concerns and updates on your health, and to receive a thorough exam.   Visit your dentist for a routine exam and preventive care every 6 months. Brush your teeth twice a day and floss once a day. Good oral hygiene prevents tooth decay and gum disease.   The frequency of eye exams is based on your age, health, family medical history, use of contact lenses, and other factors. Follow your caregiver's recommendations for frequency of eye exams.   Eat a healthy diet. Foods like vegetables, fruits, whole grains, low-fat dairy products, and lean protein foods contain the nutrients you need without too many calories. Decrease your intake of foods high in solid fats, added sugars, and salt. Eat the right amount of calories for you.Get information about a proper diet from your caregiver, if necessary.   Regular physical exercise is one of the most important things you can do for your health. Most adults should get at least 150 minutes of moderate-intensity exercise (any activity that increases your heart rate and causes you to sweat) each week. In addition, most adults need muscle-strengthening exercises on 2 or more days a week.   Maintain a healthy weight. The body mass index (BMI) is a screening tool to identify possible weight problems. It provides an estimate of body fat based on height and weight. Your caregiver can help determine your BMI, and can help you achieve or maintain a healthy weight.For adults 20 years and older:   A BMI below 18.5 is considered underweight.   A BMI of 18.5 to 24.9 is normal.   A BMI of 25 to 29.9 is considered overweight.   A BMI of 30 and above is  considered obese.   Maintain normal blood lipids and cholesterol levels by exercising and minimizing your intake of saturated fat. Eat a balanced diet with plenty of fruit and vegetables. Blood tests for lipids and cholesterol should begin at age 20 and be repeated every 5 years. If your lipid or cholesterol levels are high, you are over 50, or you are at high risk for heart disease, you may need your cholesterol levels checked more frequently.Ongoing high lipid and cholesterol levels should be treated with medicines if diet and exercise are not effective.   If you smoke, find out from your caregiver how to quit. If you do not use tobacco, do not start.   If you are pregnant, do not drink alcohol. If you are breastfeeding, be very cautious about drinking alcohol. If you are not pregnant and choose to drink alcohol, do not exceed 1 drink per day. One drink is considered to be 12 ounces (355 mL) of beer, 5 ounces (148 mL) of wine, or 1.5 ounces (44 mL) of liquor.   Avoid use of street drugs. Do not share needles with anyone. Ask for help if you need support or instructions about stopping the use of drugs.   High blood pressure causes heart disease and increases the risk of stroke. Your blood pressure should be checked at least every 1 to 2 years. Ongoing high blood pressure should be treated with medicines if weight loss and exercise are not effective.   If you are 55 to 63   years old, ask your caregiver if you should take aspirin to prevent strokes.   Diabetes screening involves taking a blood sample to check your fasting blood sugar level. This should be done once every 3 years, after age 45, if you are within normal weight and without risk factors for diabetes. Testing should be considered at a younger age or be carried out more frequently if you are overweight and have at least 1 risk factor for diabetes.   Breast cancer screening is essential preventive care for women. You should practice "breast  self-awareness." This means understanding the normal appearance and feel of your breasts and may include breast self-examination. Any changes detected, no matter how small, should be reported to a caregiver. Women in their 20s and 30s should have a clinical breast exam (CBE) by a caregiver as part of a regular health exam every 1 to 3 years. After age 40, women should have a CBE every year. Starting at age 40, women should consider having a mammography (breast X-ray test) every year. Women who have a family history of breast cancer should talk to their caregiver about genetic screening. Women at a high risk of breast cancer should talk to their caregivers about having magnetic resonance imaging (MRI) and a mammography every year.   The Pap test is a screening test for cervical cancer. A Pap test can show cell changes on the cervix that might become cervical cancer if left untreated. A Pap test is a procedure in which cells are obtained and examined from the lower end of the uterus (cervix).   Women should have a Pap test starting at age 21.   Between ages 21 and 29, Pap tests should be repeated every 2 years.   Beginning at age 30, you should have a Pap test every 3 years as long as the past 3 Pap tests have been normal.   Some women have medical problems that increase the chance of getting cervical cancer. Talk to your caregiver about these problems. It is especially important to talk to your caregiver if a new problem develops soon after your last Pap test. In these cases, your caregiver may recommend more frequent screening and Pap tests.   The above recommendations are the same for women who have or have not gotten the vaccine for human papillomavirus (HPV).   If you had a hysterectomy for a problem that was not cancer or a condition that could lead to cancer, then you no longer need Pap tests. Even if you no longer need a Pap test, a regular exam is a good idea to make sure no other problems are  starting.   If you are between ages 65 and 70, and you have had normal Pap tests going back 10 years, you no longer need Pap tests. Even if you no longer need a Pap test, a regular exam is a good idea to make sure no other problems are starting.   If you have had past treatment for cervical cancer or a condition that could lead to cancer, you need Pap tests and screening for cancer for at least 20 years after your treatment.   If Pap tests have been discontinued, risk factors (such as a new sexual partner) need to be reassessed to determine if screening should be resumed.   The HPV test is an additional test that may be used for cervical cancer screening. The HPV test looks for the virus that can cause the cell changes on the cervix.   The cells collected during the Pap test can be tested for HPV. The HPV test could be used to screen women aged 30 years and older, and should be used in women of any age who have unclear Pap test results. After the age of 30, women should have HPV testing at the same frequency as a Pap test.   Colorectal cancer can be detected and often prevented. Most routine colorectal cancer screening begins at the age of 50 and continues through age 75. However, your caregiver may recommend screening at an earlier age if you have risk factors for colon cancer. On a yearly basis, your caregiver may provide home test kits to check for hidden blood in the stool. Use of a small camera at the end of a tube, to directly examine the colon (sigmoidoscopy or colonoscopy), can detect the earliest forms of colorectal cancer. Talk to your caregiver about this at age 50, when routine screening begins. Direct examination of the colon should be repeated every 5 to 10 years through age 75, unless early forms of pre-cancerous polyps or small growths are found.   Hepatitis C blood testing is recommended for all people born from 1945 through 1965 and any individual with known risks for hepatitis C.    Practice safe sex. Use condoms and avoid high-risk sexual practices to reduce the spread of sexually transmitted infections (STIs). STIs include gonorrhea, chlamydia, syphilis, trichomonas, herpes, HPV, and human immunodeficiency virus (HIV). Herpes, HIV, and HPV are viral illnesses that have no cure. They can result in disability, cancer, and death. Sexually active women aged 25 and younger should be checked for chlamydia. Older women with new or multiple partners should also be tested for chlamydia. Testing for other STIs is recommended if you are sexually active and at increased risk.   Osteoporosis is a disease in which the bones lose minerals and strength with aging. This can result in serious bone fractures. The risk of osteoporosis can be identified using a bone density scan. Women ages 65 and over and women at risk for fractures or osteoporosis should discuss screening with their caregivers. Ask your caregiver whether you should take a calcium supplement or vitamin D to reduce the rate of osteoporosis.   Menopause can be associated with physical symptoms and risks. Hormone replacement therapy is available to decrease symptoms and risks. You should talk to your caregiver about whether hormone replacement therapy is right for you.   Use sunscreen with sun protection factor (SPF) of 30 or more. Apply sunscreen liberally and repeatedly throughout the day. You should seek shade when your shadow is shorter than you. Protect yourself by wearing long sleeves, pants, a wide-brimmed hat, and sunglasses year round, whenever you are outdoors.   Once a month, do a whole body skin exam, using a mirror to look at the skin on your back. Notify your caregiver of new moles, moles that have irregular borders, moles that are larger than a pencil eraser, or moles that have changed in shape or color.   Stay current with required immunizations.   Influenza. You need a dose every fall (or winter). The composition of  the flu vaccine changes each year, so being vaccinated once is not enough.   Pneumococcal polysaccharide. You need 1 to 2 doses if you smoke cigarettes or if you have certain chronic medical conditions. You need 1 dose at age 65 (or older) if you have never been vaccinated.   Tetanus, diphtheria, pertussis (Tdap, Td). Get 1 dose of   Tdap vaccine if you are younger than age 65, are over 65 and have contact with an infant, are a healthcare worker, are pregnant, or simply want to be protected from whooping cough. After that, you need a Td booster dose every 10 years. Consult your caregiver if you have not had at least 3 tetanus and diphtheria-containing shots sometime in your life or have a deep or dirty wound.   HPV. You need this vaccine if you are a woman age 26 or younger. The vaccine is given in 3 doses over 6 months.   Measles, mumps, rubella (MMR). You need at least 1 dose of MMR if you were born in 1957 or later. You may also need a second dose.   Meningococcal. If you are age 19 to 21 and a first-year college student living in a residence hall, or have one of several medical conditions, you need to get vaccinated against meningococcal disease. You may also need additional booster doses.   Zoster (shingles). If you are age 60 or older, you should get this vaccine.   Varicella (chickenpox). If you have never had chickenpox or you were vaccinated but received only 1 dose, talk to your caregiver to find out if you need this vaccine.   Hepatitis A. You need this vaccine if you have a specific risk factor for hepatitis A virus infection or you simply wish to be protected from this disease. The vaccine is usually given as 2 doses, 6 to 18 months apart.   Hepatitis B. You need this vaccine if you have a specific risk factor for hepatitis B virus infection or you simply wish to be protected from this disease. The vaccine is given in 3 doses, usually over 6 months.  Preventive Services /  Frequency Ages 19 to 39  Blood pressure check.** / Every 1 to 2 years.   Lipid and cholesterol check.** / Every 5 years beginning at age 20.   Clinical breast exam.** / Every 3 years for women in their 20s and 30s.   Pap test.** / Every 2 years from ages 21 through 29. Every 3 years starting at age 30 through age 65 or 70 with a history of 3 consecutive normal Pap tests.   HPV screening.** / Every 3 years from ages 30 through ages 65 to 70 with a history of 3 consecutive normal Pap tests.   Hepatitis C blood test.** / For any individual with known risks for hepatitis C.   Skin self-exam. / Monthly.   Influenza immunization.** / Every year.   Pneumococcal polysaccharide immunization.** / 1 to 2 doses if you smoke cigarettes or if you have certain chronic medical conditions.   Tetanus, diphtheria, pertussis (Tdap, Td) immunization. / A one-time dose of Tdap vaccine. After that, you need a Td booster dose every 10 years.   HPV immunization. / 3 doses over 6 months, if you are 26 and younger.   Measles, mumps, rubella (MMR) immunization. / You need at least 1 dose of MMR if you were born in 1957 or later. You may also need a second dose.   Meningococcal immunization. / 1 dose if you are age 19 to 21 and a first-year college student living in a residence hall, or have one of several medical conditions, you need to get vaccinated against meningococcal disease. You may also need additional booster doses.   Varicella immunization.** / Consult your caregiver.   Hepatitis A immunization.** / Consult your caregiver. 2 doses, 6 to 18 months   apart.   Hepatitis B immunization.** / Consult your caregiver. 3 doses usually over 6 months.  Ages 40 to 64  Blood pressure check.** / Every 1 to 2 years.   Lipid and cholesterol check.** / Every 5 years beginning at age 20.   Clinical breast exam.** / Every year after age 40.   Mammogram.** / Every year beginning at age 40 and continuing for as  long as you are in good health. Consult with your caregiver.   Pap test.** / Every 3 years starting at age 30 through age 65 or 70 with a history of 3 consecutive normal Pap tests.   HPV screening.** / Every 3 years from ages 30 through ages 65 to 70 with a history of 3 consecutive normal Pap tests.   Fecal occult blood test (FOBT) of stool. / Every year beginning at age 50 and continuing until age 75. You may not need to do this test if you get a colonoscopy every 10 years.   Flexible sigmoidoscopy or colonoscopy.** / Every 5 years for a flexible sigmoidoscopy or every 10 years for a colonoscopy beginning at age 50 and continuing until age 75.   Hepatitis C blood test.** / For all people born from 1945 through 1965 and any individual with known risks for hepatitis C.   Skin self-exam. / Monthly.   Influenza immunization.** / Every year.   Pneumococcal polysaccharide immunization.** / 1 to 2 doses if you smoke cigarettes or if you have certain chronic medical conditions.   Tetanus, diphtheria, pertussis (Tdap, Td) immunization.** / A one-time dose of Tdap vaccine. After that, you need a Td booster dose every 10 years.   Measles, mumps, rubella (MMR) immunization. / You need at least 1 dose of MMR if you were born in 1957 or later. You may also need a second dose.   Varicella immunization.** / Consult your caregiver.   Meningococcal immunization.** / Consult your caregiver.   Hepatitis A immunization.** / Consult your caregiver. 2 doses, 6 to 18 months apart.   Hepatitis B immunization.** / Consult your caregiver. 3 doses, usually over 6 months.  Ages 65 and over  Blood pressure check.** / Every 1 to 2 years.   Lipid and cholesterol check.** / Every 5 years beginning at age 20.   Clinical breast exam.** / Every year after age 40.   Mammogram.** / Every year beginning at age 40 and continuing for as long as you are in good health. Consult with your caregiver.   Pap test.** /  Every 3 years starting at age 30 through age 65 or 70 with a 3 consecutive normal Pap tests. Testing can be stopped between 65 and 70 with 3 consecutive normal Pap tests and no abnormal Pap or HPV tests in the past 10 years.   HPV screening.** / Every 3 years from ages 30 through ages 65 or 70 with a history of 3 consecutive normal Pap tests. Testing can be stopped between 65 and 70 with 3 consecutive normal Pap tests and no abnormal Pap or HPV tests in the past 10 years.   Fecal occult blood test (FOBT) of stool. / Every year beginning at age 50 and continuing until age 75. You may not need to do this test if you get a colonoscopy every 10 years.   Flexible sigmoidoscopy or colonoscopy.** / Every 5 years for a flexible sigmoidoscopy or every 10 years for a colonoscopy beginning at age 50 and continuing until age 75.   Hepatitis   C blood test.** / For all people born from 1945 through 1965 and any individual with known risks for hepatitis C.   Osteoporosis screening.** / A one-time screening for women ages 65 and over and women at risk for fractures or osteoporosis.   Skin self-exam. / Monthly.   Influenza immunization.** / Every year.   Pneumococcal polysaccharide immunization.** / 1 dose at age 65 (or older) if you have never been vaccinated.   Tetanus, diphtheria, pertussis (Tdap, Td) immunization. / A one-time dose of Tdap vaccine if you are over 65 and have contact with an infant, are a healthcare worker, or simply want to be protected from whooping cough. After that, you need a Td booster dose every 10 years.   Varicella immunization.** / Consult your caregiver.   Meningococcal immunization.** / Consult your caregiver.   Hepatitis A immunization.** / Consult your caregiver. 2 doses, 6 to 18 months apart.   Hepatitis B immunization.** / Check with your caregiver. 3 doses, usually over 6 months.  ** Family history and personal history of risk and conditions may change your caregiver's  recommendations. Document Released: 08/27/2001 Document Revised: 06/20/2011 Document Reviewed: 11/26/2010 [employer] Patient Information 2012 [employer]. 

## 2012-04-03 NOTE — Progress Notes (Signed)
Subjective:    Patient ID: Charles Trujillo, male    DOB: [birth year], 60 y.o.   MRN: [id number]  HPI Pt here for cpe and labs.  Pt under the care of cardiology for a flutter.  Pt feeling better but still gets sob with even a little bit of exertion.     Review of Systems Review of Systems  Constitutional: Negative for activity change, appetite change and fatigue.  HENT: Negative for hearing loss, congestion, tinnitus and ear discharge.  dentist q22m Eyes: Negative for visual disturbance (see optho q1y -- vision corrected to 20/20 with glasses).  Respiratory: Negative for cough, chest tightness and shortness of breath.   Cardiovascular: Negative for chest pain, palpitations and leg swelling.  Gastrointestinal: Negative for abdominal pain, diarrhea, constipation and abdominal distention.  Genitourinary: Negative for urgency, frequency, decreased urine volume and difficulty urinating.  Musculoskeletal: Negative for back pain, arthralgias and gait problem.  Skin: Negative for color change, pallor and rash.  Neurological: Negative for dizziness, light-headedness, numbness and headaches.  Hematological: Negative for adenopathy. Does not bruise/bleed easily.  Psychiatric/Behavioral: Negative for suicidal ideas, confusion, sleep disturbance, self-injury, dysphoric mood, decreased concentration and agitation.   Past Medical History  Diagnosis Date  . Depression   . Hypertension   . Elevated LFTs     previously evaluated by Dr Jarold Motto; h/o "fatty liver"  . DM2 (diabetes mellitus, type 2)   . Inguinal hernia     right  . Arthritis     bilateral knees  . HLD (hyperlipidemia)    History   Social History  . Marital Status: Married    Spouse Name: N/A    Number of Children: 1  . Years of Education: N/A   Occupational History  . 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 Smoking status: Current Some Day Smoker    Types: Cigars  . Smokeless tobacco:  Not on file   Comment: stopped smoking cigars 2 weeks before surgery  . Alcohol Use: 3.6 oz/week    6 Cans of beer per week  . Drug Use: No  . Sexually Active: Yes -- Male partner(s)   Other Topics Concern  . Not on file   Social History Narrative   Exercise--- not regularly   Family History  Problem Relation Age of Onset  . Diabetes Mother   . Hypertension Mother   . Heart disease Mother   . Stroke Father 47  . Parkinsonism Father   . Depression Brother   . Alcohol abuse Brother   . Cancer Maternal Grandmother     bone  . Heart failure Mother    Allergies  Allergen Reactions  . Codeine Phosphate     REACTION: nausea         Objective:   Physical Exam  Nursing note and vitals reviewed. Constitutional: He is oriented to person, place, and time. He appears well-developed and well-nourished.  HENT:  Head: Normocephalic and atraumatic.  Right Ear: External ear normal.  Left Ear: External ear normal.  Nose: Nose normal.  Mouth/Throat: Oropharynx is clear and moist. No oropharyngeal exudate.  Eyes: Conjunctivae normal and EOM are normal. Pupils are equal, round, and reactive to light. Right eye exhibits no discharge. Left eye exhibits no discharge.  Neck: Normal range of motion. Neck supple. No JVD present. No tracheal deviation present. No thyromegaly present.  Cardiovascular: Normal rate, regular rhythm and normal heart sounds.  Exam reveals no gallop and no friction rub.  No murmur heard. Pulmonary/Chest: Effort normal. No stridor. No respiratory distress. He has no wheezes. He has no rales. He exhibits no tenderness.  Abdominal: Soft. Bowel sounds are normal. He exhibits no distension and no mass. There is no tenderness. There is no rebound and no guarding.  Genitourinary: Rectum normal, prostate normal and penis normal. Guaiac negative stool. No penile tenderness.  Musculoskeletal: Normal range of motion. He exhibits no edema and no tenderness.  Lymphadenopathy:     He has no cervical adenopathy.  Neurological: He is alert and oriented to person, place, and time. He has normal reflexes. He displays normal reflexes. No cranial nerve deficit. He exhibits normal muscle tone. Coordination normal.  Skin: Skin is warm and dry. No rash noted. No erythema. No pallor.  Psychiatric: He has a normal mood and affect. His behavior is normal. Judgment and thought content normal.   Filed Vitals:   04/03/12 0949  BP: 140/90  Pulse: 95  Temp: 98.5 F (36.9 C)  TempSrc: Oral  Height: 5\' 8"  (1.727 m)  Weight: 200 lb (90.719 kg)  SpO2: 96%          Assessment & Plan:  cpe--- check labs            ghm utd

## 2012-04-03 NOTE — Assessment & Plan Note (Signed)
Check labs con't meds 

## 2012-04-03 NOTE — Assessment & Plan Note (Signed)
Cont meds Check labs 

## 2012-04-03 NOTE — Assessment & Plan Note (Signed)
Cont meds   

## 2012-04-03 NOTE — Assessment & Plan Note (Signed)
Per cardio On diltiazam

## 2012-04-06 DIAGNOSIS — I4892 Unspecified atrial flutter: Secondary | ICD-10-CM

## 2012-04-06 MED ORDER — METFORMIN HCL ER 500 MG PO TB24: ORAL_TABLET | ORAL | Status: DC

## 2012-04-06 MED ORDER — CELECOXIB 200 MG PO CAPS: 200.0000 mg | ORAL_CAPSULE | Freq: Every day | ORAL | Status: DC

## 2012-04-06 MED ORDER — DILTIAZEM HCL ER 120 MG PO CP24: 120.0000 mg | ORAL_CAPSULE | Freq: Every day | ORAL | Status: DC

## 2012-04-07 ENCOUNTER — Telehealth: Payer: Self-pay

## 2012-04-07 NOTE — Telephone Encounter (Signed)
msg left to call the office     KP 

## 2012-04-07 NOTE — Telephone Encounter (Signed)
Pt calling back stating that he was speaking with Selena Batten earlier regarding some prescriptions and would like to speak with her again.

## 2012-04-08 MED ORDER — DULOXETINE HCL 60 MG PO CPEP: 60.0000 mg | ORAL_CAPSULE | Freq: Every day | ORAL | Status: DC

## 2012-04-08 MED ORDER — LISINOPRIL-HYDROCHLOROTHIAZIDE 20-12.5 MG PO TABS: 1.0000 | ORAL_TABLET | Freq: Every day | ORAL | Status: DC

## 2012-04-08 NOTE — Telephone Encounter (Signed)
Wife called to request medication refill---- Rx faxed    KP

## 2012-04-17 ENCOUNTER — Ambulatory Visit (INDEPENDENT_AMBULATORY_CARE_PROVIDER_SITE_OTHER): Payer: 59 | Admitting: Cardiology

## 2012-04-17 ENCOUNTER — Encounter: Payer: Self-pay | Admitting: Cardiology

## 2012-04-17 VITALS — BP 140/80 | HR 109 | Ht 68.0 in | Wt 199.6 lb

## 2012-04-17 DIAGNOSIS — R0600 Dyspnea, unspecified: Secondary | ICD-10-CM

## 2012-04-17 DIAGNOSIS — I10 Essential (primary) hypertension: Secondary | ICD-10-CM

## 2012-04-17 DIAGNOSIS — R000 Tachycardia, unspecified: Secondary | ICD-10-CM

## 2012-04-17 DIAGNOSIS — I4892 Unspecified atrial flutter: Secondary | ICD-10-CM

## 2012-04-17 MED ORDER — DILTIAZEM HCL ER 180 MG PO CP24: 180.0000 mg | ORAL_CAPSULE | Freq: Every day | ORAL | Status: DC

## 2012-04-17 NOTE — Progress Notes (Signed)
HPI Patient presents for followup of tachycardia dyspnea and hypertension. We saw him previously when he was thought to have atrial flutter with this was an incorrect interpretation. He does have sinus tachycardia. He has been describing dyspnea with exertion. This is described previously. He says he's been getting short of breath for 6 months. He notices this feeling usual activities he is clearly a change from previous.  He is not getting any chest pressure, neck or arm discomfort.  He does note that his heart rate runs high and he is checking his blood pressure but that is better than it was prior to the Cardizem. His blood pressure is better controlled but still can run high at times. He's not having any presyncope or syncope. He's not having any PND or orthopnea.   Allergies  Allergen Reactions  . Codeine Phosphate     REACTION: nausea    Current Outpatient Prescriptions  Medication Sig Dispense Refill  . aspirin EC 81 MG tablet Take 1 tablet (81 mg total) by mouth daily.      Marland Kitchen atorvastatin (LIPITOR) 20 MG tablet Take 1 tablet (20 mg total) by mouth daily.  90 tablet  1  . celecoxib (CELEBREX) 200 MG capsule Take 1 capsule (200 mg total) by mouth daily.  90 capsule  1  . diltiazem (DILACOR XR) 120 MG 24 hr capsule Take 1 capsule (120 mg total) by mouth daily.  90 capsule  1  . DULoxetine (CYMBALTA) 60 MG capsule Take 1 capsule (60 mg total) by mouth daily.  90 capsule  1  . glucose blood (ONE TOUCH TEST STRIPS) test strip 1 each by Other route 2 (two) times daily. accu check       . ibuprofen (ADVIL,MOTRIN) 200 MG tablet Take 400 mg by mouth every 6 (six) hours as needed. Pain       . lisinopril-hydrochlorothiazide (ZESTORETIC) 20-12.5 MG per tablet Take 1 tablet by mouth daily.  90 tablet  1  . metFORMIN (GLUCOPHAGE XR) 500 MG 24 hr tablet Take 2 tablets by mouth daily--Labs are due now  180 tablet  1  . ONETOUCH DELICA LANCETS MISC 2 (two) times daily. accu check       . sildenafil  (VIAGRA) 100 MG tablet Take 100 mg by mouth as needed.         Past Medical History  Diagnosis Date  . Depression   . Hypertension   . Elevated LFTs     previously evaluated by Dr Jarold Motto; h/o "fatty liver"  . DM2 (diabetes mellitus, type 2)   . Inguinal hernia     right  . Arthritis     bilateral knees  . HLD (hyperlipidemia)     Past Surgical History  Procedure Date  . Knee surgery     age 68- right  . Achilles tendon repair 1999  . Squamous cell carcinoma excision     left knee  . Knee arthroscopy 1993    left knee  . Inguinal hernia repair 08/09/2011    Procedure: LAPAROSCOPIC INGUINAL HERNIA;  Surgeon: Clovis Pu. Cornett, MD;  Location: [facility];  Service: General;  Laterality: Right;  . Hernia repair 07/2011    ROS:  As stated in the HPI and negative for all other systems.  PHYSICAL EXAM BP 140/80  Pulse 109  Ht 5\' 8"  (1.727 m)  Wt 90.538 kg (199 lb 9.6 oz)  BMI 30.35 kg/m2 GENERAL:  Well appearing HEENT:  Pupils equal round and reactive, fundi  not visualized, oral mucosa unremarkable NECK:  No jugular venous distention, waveform within normal limits, carotid upstroke brisk and symmetric, no bruits, no thyromegaly LYMPHATICS:  No cervical, inguinal adenopathy LUNGS:  Clear to auscultation bilaterally BACK:  No CVA tenderness CHEST:  Unremarkable HEART:  PMI not displaced or sustained,S1 and S2 within normal limits, no S3, no S4, no clicks, no rubs, no murmurs ABD:  Flat, positive bowel sounds normal in frequency in pitch, no bruits, no rebound, no guarding, no midline pulsatile mass, no hepatomegaly, no splenomegaly EXT:  2 plus pulses throughout, no edema, no cyanosis no clubbing SKIN:  No rashes no nodules NEURO:  Cranial nerves II through XII grossly intact, motor grossly intact throughout PSYCH:  Cognitively intact, oriented to person place and time   EKG: Sinus rhythm, rate 105, axis within normal limits, intervals within normal limits, no acute ST-T wave  changes. 04/17/2012   ASSESSMENT AND PLAN  1. Tachycardia: His heart rate demonstrated sinus tachycardia again. He thinks this is somewhat improved with the Cardizem and I will increase his dose. I will consider further monitoring such as a Holter in the future.   2. Dyspnea: Normal echocardiogram recently. Stress testing demonstrated no evidence of ischemia. He continues to have dyspnea and I will set him up for cardiopulmonary stress testing.  3. Hypertension: I will treat this in the context of managing his tachycardia.

## 2012-04-17 NOTE — Patient Instructions (Addendum)
Please increase your Cardizem to 180 mg a day Continue all other medications as listed  Your physician has recommended that you have a cardiopulmonary stress test (CPX). CPX testing is a non-invasive measurement of heart and lung function. It replaces a traditional treadmill stress test. This type of test provides a tremendous amount of information that relates not only to your present condition but also for future outcomes. This test combines measurements of you ventilation, respiratory gas exchange in the lungs, electrocardiogram (EKG), blood pressure and physical response before, during, and following an exercise protocol.  Please follow up with Dr Antoine Poche after testing.

## 2012-04-21 ENCOUNTER — Ambulatory Visit (HOSPITAL_COMMUNITY): Payer: 59 | Attending: Cardiology

## 2012-04-21 DIAGNOSIS — R0600 Dyspnea, unspecified: Secondary | ICD-10-CM

## 2012-04-21 DIAGNOSIS — R0609 Other forms of dyspnea: Secondary | ICD-10-CM

## 2012-04-21 DIAGNOSIS — R0989 Other specified symptoms and signs involving the circulatory and respiratory systems: Secondary | ICD-10-CM

## 2012-04-27 ENCOUNTER — Other Ambulatory Visit: Payer: Self-pay

## 2012-04-27 MED ORDER — DULOXETINE HCL 60 MG PO CPEP: 60.0000 mg | ORAL_CAPSULE | Freq: Every day | ORAL | Status: DC

## 2012-04-27 NOTE — Telephone Encounter (Signed)
OV 04/17/12. Last filled 04/08/12 #90 x 1 . Plz advise    MW

## 2012-04-30 ENCOUNTER — Encounter: Payer: Self-pay | Admitting: Cardiology

## 2012-04-30 ENCOUNTER — Ambulatory Visit (INDEPENDENT_AMBULATORY_CARE_PROVIDER_SITE_OTHER): Payer: 59 | Admitting: Cardiology

## 2012-04-30 VITALS — BP 136/74 | HR 89 | Ht 68.0 in | Wt 199.1 lb

## 2012-04-30 DIAGNOSIS — R000 Tachycardia, unspecified: Secondary | ICD-10-CM

## 2012-04-30 DIAGNOSIS — G5600 Carpal tunnel syndrome, unspecified upper limb: Secondary | ICD-10-CM

## 2012-04-30 DIAGNOSIS — I10 Essential (primary) hypertension: Secondary | ICD-10-CM

## 2012-04-30 DIAGNOSIS — E119 Type 2 diabetes mellitus without complications: Secondary | ICD-10-CM

## 2012-04-30 NOTE — Progress Notes (Signed)
HPI Patient presents for followup of tachycardia dyspnea and hypertension. We saw him previously when he was thought to have atrial flutter with this was an incorrect interpretation. He does have sinus tachycardia. He he has had an echocardiogram demonstrating no significant abnormalities. Stress perfusion study demonstrated an EF of 77% with no evidence of ischemia or infarct. Preliminary cardiopulmonary stress testing did not suggest a respiratory and there is limitation to explain his symptoms. However, he did have a hypertensive blood pressure response with a maximum 230. He demonstrated a similar response during his stress perfusion study.  Allergies  Allergen Reactions  . Codeine Phosphate     REACTION: nausea    Current Outpatient Prescriptions  Medication Sig Dispense Refill  . aspirin EC 81 MG tablet Take 1 tablet (81 mg total) by mouth daily.      Marland Kitchen atorvastatin (LIPITOR) 20 MG tablet Take 1 tablet (20 mg total) by mouth daily.  90 tablet  1  . celecoxib (CELEBREX) 200 MG capsule Take 1 capsule (200 mg total) by mouth daily.  90 capsule  1  . diltiazem (DILACOR XR) 180 MG 24 hr capsule Take 1 capsule (180 mg total) by mouth daily.  90 capsule  3  . DULoxetine (CYMBALTA) 60 MG capsule Take 1 capsule (60 mg total) by mouth daily.  90 capsule  1  . glucose blood (ONE TOUCH TEST STRIPS) test strip 1 each by Other route 2 (two) times daily. accu check       . ibuprofen (ADVIL,MOTRIN) 200 MG tablet Take 400 mg by mouth every 6 (six) hours as needed. Pain       . lisinopril-hydrochlorothiazide (ZESTORETIC) 20-12.5 MG per tablet Take 1 tablet by mouth daily.  90 tablet  1  . metFORMIN (GLUCOPHAGE XR) 500 MG 24 hr tablet Take 2 tablets by mouth daily--Labs are due now  180 tablet  1  . ONETOUCH DELICA LANCETS MISC 2 (two) times daily. accu check       . sildenafil (VIAGRA) 100 MG tablet Take 100 mg by mouth as needed.         Past Medical History  Diagnosis Date  . Depression   .  Hypertension   . Elevated LFTs     previously evaluated by Dr Jarold Motto; h/o "fatty liver"  . DM2 (diabetes mellitus, type 2)   . Inguinal hernia     right  . Arthritis     bilateral knees  . HLD (hyperlipidemia)     Past Surgical History  Procedure Date  . Knee surgery     age 61- right  . Achilles tendon repair 1999  . Squamous cell carcinoma excision     left knee  . Knee arthroscopy 1993    left knee  . Inguinal hernia repair 08/09/2011    Procedure: LAPAROSCOPIC INGUINAL HERNIA;  Surgeon: Clovis Pu. Cornett, MD;  Location: [facility];  Service: General;  Laterality: Right;  . Hernia repair 07/2011    ROS:  As stated in the HPI and negative for all other systems.  PHYSICAL EXAM BP 136/74  Pulse 89  Ht 5\' 8"  (1.727 m)  Wt 199 lb 1.9 oz (90.32 kg)  BMI 30.28 kg/m2 GENERAL:  Well appearing HEENT:  Pupils equal round and reactive, fundi not visualized, oral mucosa unremarkable NECK:  No jugular venous distention, waveform within normal limits, carotid upstroke brisk and symmetric, no bruits, no thyromegaly LYMPHATICS:  No cervical, inguinal adenopathy LUNGS:  Clear to auscultation bilaterally BACK:  No CVA tenderness CHEST:  Unremarkable HEART:  PMI not displaced or sustained,S1 and S2 within normal limits, no S3, no S4, no clicks, no rubs, no murmurs ABD:  Flat, positive bowel sounds normal in frequency in pitch, no bruits, no rebound, no guarding, no midline pulsatile mass, no hepatomegaly, no splenomegaly EXT:  2 plus pulses throughout, no edema, no cyanosis no clubbing SKIN:  No rashes no nodules NEURO:  Cranial nerves II through XII grossly intact, motor grossly intact throughout PSYCH:  Cognitively intact, oriented to person place and time   ASSESSMENT AND PLAN  Tachycardia:  He just started a higher dose of Cardizem. His heart rate is down today. He will keep and I on this and his blood pressure. He will continue the medicines as listed.  Dyspnea:  He has now had  multiple tests in the one thing that seems to be consistent is a hypertensive blood pressure response which may be contributing to his dyspnea. He has started a higher dose of Cardizem and he will continue this. There was no evidence of exercise-induced asthma on the recent  CPX. I do not suspect ischemia from the recent perfusion study. We talked about salt restriction. We talked about a 5 pound weight loss. If his breathing gets worse rather than better I will consider further evaluation.  Hypertension:  I will treat this as above.  He also drinks about 3 beers per night and the guidelines suggest no more than 1. We discussed this

## 2012-04-30 NOTE — Patient Instructions (Addendum)
The current medical regimen is effective;  continue present plan and medications.  Please lose 5 lbs before your next office visit.  Restrict your sodium intake to no more that 2000 mg a day  Follow up with Dr Antoine Poche in 4 months.  2 Gram Low Sodium Diet A 2 gram sodium diet restricts the amount of sodium in the diet to no more than 2 g or 2000 mg daily. Limiting the amount of sodium is often used to help lower blood pressure. It is important if you have heart, liver, or kidney problems. Many foods contain sodium for flavor and sometimes as a preservative. When the amount of sodium in a diet needs to be low, it is important to know what to look for when choosing foods and drinks. The following includes some information and guidelines to help make it easier for you to adapt to a low sodium diet. QUICK TIPS  Do not add salt to food.  Avoid convenience items and fast food.  Choose unsalted snack foods.  Buy lower sodium products, often labeled as "lower sodium" or "no salt added."  Check food labels to learn how much sodium is in 1 serving.  When eating at a restaurant, ask that your food be prepared with less salt or none, if possible. READING FOOD LABELS FOR SODIUM INFORMATION The nutrition facts label is a good place to find how much sodium is in foods. Look for products with no more than 500 to 600 mg of sodium per meal and no more than 150 mg per serving. Remember that 2 g = 2000 mg. The food label may also list foods as:  Sodium-free: Less than 5 mg in a serving.  Very low sodium: 35 mg or less in a serving.  Low-sodium: 140 mg or less in a serving.  Light in sodium: 50% less sodium in a serving. For example, if a food that usually has 300 mg of sodium is changed to become light in sodium, it will have 150 mg of sodium.  Reduced sodium: 25% less sodium in a serving. For example, if a food that usually has 400 mg of sodium is changed to reduced sodium, it will have 300 mg of  sodium. CHOOSING FOODS Grains  Avoid: Salted crackers and snack items. Some cereals, including instant hot cereals. Bread stuffing and biscuit mixes. Seasoned rice or pasta mixes.  Choose: Unsalted snack items. Low-sodium cereals, oats, puffed wheat and rice, shredded wheat. English muffins and bread. Pasta. Meats  Avoid: Salted, canned, smoked, spiced, pickled meats, including fish and poultry. Bacon, ham, sausage, cold cuts, hot dogs, anchovies.  Choose: Low-sodium canned tuna and salmon. Fresh or frozen meat, poultry, and fish. Dairy  Avoid: Processed cheese and spreads. Cottage cheese. Buttermilk and condensed milk. Regular cheese.  Choose: Milk. Low-sodium cottage cheese. Yogurt. Sour cream. Low-sodium cheese. Fruits and Vegetables  Avoid: Regular canned vegetables. Regular canned tomato sauce and paste. Frozen vegetables in sauces. Olives. Rosita Fire. Relishes. Sauerkraut.  Choose: Low-sodium canned vegetables. Low-sodium tomato sauce and paste. Frozen or fresh vegetables. Fresh and frozen fruit. Condiments  Avoid: Canned and packaged gravies. Worcestershire sauce. Tartar sauce. Barbecue sauce. Soy sauce. Steak sauce. Ketchup. Onion, garlic, and table salt. Meat flavorings and tenderizers.  Choose: Fresh and dried herbs and spices. Low-sodium varieties of mustard and ketchup. Lemon juice. Tabasco sauce. Horseradish. SAMPLE 2 GRAM SODIUM MEAL PLAN Breakfast / Sodium (mg)  1 cup low-fat milk / 143 mg  2 slices whole-wheat toast / 270 mg  1 tbs heart-healthy margarine / 153 mg  1 hard-boiled egg / 139 mg  1 small orange / 0 mg Lunch / Sodium (mg)  1 cup raw carrots / 76 mg   cup hummus / 298 mg  1 cup low-fat milk / 143 mg   cup red grapes / 2 mg  1 whole-wheat pita bread / 356 mg Dinner / Sodium (mg)  1 cup whole-wheat pasta / 2 mg  1 cup low-sodium tomato sauce / 73 mg  3 oz lean ground beef / 57 mg  1 small side salad (1 cup raw spinach leaves,  cup  cucumber,  cup yellow bell pepper) with 1 tsp olive oil and 1 tsp red wine vinegar / 25 mg Snack / Sodium (mg)  1 container low-fat vanilla yogurt / 107 mg  3 graham cracker squares / 127 mg Nutrient Analysis  Calories: 2033  Protein: 77 g  Carbohydrate: 282 g  Fat: 72 g  Sodium: 1971 mg Document Released: 07/01/2005 Document Revised: 09/23/2011 Document Reviewed: 10/02/2009 [facility] Patient Information 2013 [place], [place].

## 2012-07-18 ENCOUNTER — Ambulatory Visit: Payer: 59 | Admitting: Family Medicine

## 2012-08-13 ENCOUNTER — Encounter: Payer: Self-pay | Admitting: Family Medicine

## 2012-08-13 ENCOUNTER — Ambulatory Visit (INDEPENDENT_AMBULATORY_CARE_PROVIDER_SITE_OTHER): Payer: 59 | Admitting: Family Medicine

## 2012-08-13 VITALS — BP 146/82 | HR 105 | Temp 98.3°F | Wt 200.2 lb

## 2012-08-13 DIAGNOSIS — S139XXA Sprain of joints and ligaments of unspecified parts of neck, initial encounter: Secondary | ICD-10-CM

## 2012-08-13 NOTE — Patient Instructions (Addendum)
Whiplash    Whiplash is a soft tissue injury to the neck. It is also called neck sprain or neck strain. It is a collection of symptoms that occur after sudden extension and flexion of the neck, as happens in an automobile crash. Whiplash is not due to a bone fracture, dislocation, or a disc that sticks out (herniated).  CAUSES   The disorder commonly occurs as the result of an automobile crash.  SYMPTOMS   · Neck pain may be present directly after the injury or may be delayed for several days.   · In addition to neck pain, other symptoms may include:   · Neck stiffness.   · Injuries to the muscles and ligaments.   · Headache.   · Dizziness.   · Abnormal sensations such as burning or prickling (paresthesias).   · Shoulder or back pain.   · Some people experience conditions such as:   · Memory loss.   · Concentration impairment.   · Nervousness.   · Irritability.   · Sleep disturbances.   · Fatigue.   · Depression.   TREATMENT   Treatment for individuals with whiplash may include:  · Pain medications.   · Nonsteroidal anti-inflammatory drugs.   · Antidepressants.   · Cervical collar.   · Range of motion exercises.   · Physical therapy.   · Supplemental heat application may relieve muscle tension.   LENGTH OF ILLNESS  Generally, the prognosis for individuals with whiplash is excellent. The neck and head pain clears within a few days or weeks. Most patients recover within 3 months after the injury. However, some may continue to have lasting neck pain and headaches.  Document Released: 04/10/2005 Document Revised: 03/13/2011 Document Reviewed: 12/19/2008  [employer]® Patient Information ©2012 [employer].
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
## 2012-08-29 ENCOUNTER — Other Ambulatory Visit: Payer: Self-pay

## 2012-09-03 ENCOUNTER — Ambulatory Visit: Payer: 59 | Admitting: Cardiology

## 2012-11-03 ENCOUNTER — Telehealth: Payer: Self-pay

## 2012-11-03 NOTE — Telephone Encounter (Signed)
Wife stated patient went to see another Cardiologist. Please review records from [facility]       [place]

## 2012-11-11 ENCOUNTER — Other Ambulatory Visit: Payer: Self-pay | Admitting: Family Medicine

## 2012-11-13 ENCOUNTER — Other Ambulatory Visit: Payer: Self-pay | Admitting: Family Medicine

## 2012-11-13 ENCOUNTER — Telehealth: Payer: Self-pay | Admitting: Family Medicine

## 2012-11-13 DIAGNOSIS — E785 Hyperlipidemia, unspecified: Secondary | ICD-10-CM

## 2012-11-13 DIAGNOSIS — E119 Type 2 diabetes mellitus without complications: Secondary | ICD-10-CM

## 2012-11-13 MED ORDER — CELECOXIB 200 MG PO CAPS: 200.0000 mg | ORAL_CAPSULE | Freq: Every day | ORAL | Status: DC

## 2012-11-13 NOTE — Telephone Encounter (Signed)
Patient states he needs refill on Celebrex. He states he has tried to contact his pharmacy, but we have not received anything from them. Pt uses [facility].

## 2012-11-16 ENCOUNTER — Other Ambulatory Visit (INDEPENDENT_AMBULATORY_CARE_PROVIDER_SITE_OTHER): Payer: 59

## 2012-11-16 ENCOUNTER — Other Ambulatory Visit: Payer: Self-pay

## 2012-11-16 DIAGNOSIS — E785 Hyperlipidemia, unspecified: Secondary | ICD-10-CM

## 2012-11-16 DIAGNOSIS — E119 Type 2 diabetes mellitus without complications: Secondary | ICD-10-CM

## 2012-11-16 LAB — HEMOGLOBIN A1C: Hgb A1c MFr Bld: 6.7 % — ABNORMAL HIGH (ref 4.6–6.5)

## 2012-11-16 LAB — LIPID PANEL
HDL: 71.9 mg/dL (ref >39.00)
Total CHOL/HDL Ratio: 2
Triglycerides: 51 mg/dL (ref 0.0–149.0)
VLDL: 10.2 mg/dL (ref 0.0–40.0)

## 2012-11-16 LAB — BASIC METABOLIC PANEL
GFR: 71.77 mL/min (ref >60.00)
Glucose, Bld: 137 mg/dL — ABNORMAL HIGH (ref 70–99)
Potassium: 4.2 mEq/L (ref 3.5–5.1)
Sodium: 137 mEq/L (ref 135–145)

## 2012-11-16 LAB — HEPATIC FUNCTION PANEL
ALT: 63 U/L — ABNORMAL HIGH (ref 0–53)
Albumin: 4.2 g/dL (ref 3.5–5.2)
Total Bilirubin: 0.8 mg/dL (ref 0.3–1.2)

## 2012-11-16 MED ORDER — METFORMIN HCL ER 500 MG PO TB24: ORAL_TABLET | ORAL | Status: DC

## 2012-11-16 NOTE — Telephone Encounter (Signed)
Rx sent to the pharmacy.     KP 

## 2012-11-17 ENCOUNTER — Encounter: Payer: Self-pay | Admitting: Internal Medicine

## 2012-11-17 ENCOUNTER — Ambulatory Visit (INDEPENDENT_AMBULATORY_CARE_PROVIDER_SITE_OTHER): Payer: 59 | Admitting: Internal Medicine

## 2012-11-17 VITALS — BP 122/64 | HR 109 | Temp 97.6°F | Resp 12 | Ht 68.0 in | Wt 203.0 lb

## 2012-11-17 DIAGNOSIS — I10 Essential (primary) hypertension: Secondary | ICD-10-CM

## 2012-11-17 NOTE — Patient Instructions (Addendum)
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 I will let you know the results of your labs through MyChart.

## 2012-11-17 NOTE — Progress Notes (Signed)
Subjective:     Patient ID: Charles Trujillo, male   DOB: 26-Mar-1951, 62 y.o.   MRN: [id number]  HPI Mr. Haste is a 62 y/o man referred by his cardiologist, Dr. Loney Loh, at [facility], for evaluation for hypertensive spells, with elevated norepinephrine level, to rule out pheochromocytoma.  Mr. Plascencia was recently seen by Dr. Claris Gower (10/19/2012) for tachycardia and shortness of breath, along with hypertensive paroxysms. During the investigation, Dr. Claris Gower checked his plasma catecholamines, and they were elevated as follows: Epinephrine 45 pg/mL (supine <50, upright <95); norepinephrine 1362 (supine 112-658, upright [phone]); dopamine 74 (supine <30, uprights <30); Total Catecholamines (N+E) 1407 (supine 123-671, upright [phone]). Tests were drawn upright.  Patient has initially been seen by Dr. Antoine Poche with [facility] cardiology in 04/2012, for tachycardia and hypertension. He was initially diagnosed with atrial flutter, however, on review of the EKG, and this was a technical error, and in fact, he was in sinus tachycardia. He was on Cardizem with fairly good control of blood pressure, however he still had paroxysms. His Cardizem was increased last fall from 120-180 mg daily. He also is on lisinopril-HCTZ. Patient had a normal 2-D echogram in 02/2012, with an EF of 55-60%. He also had a stress test in 03/2012, showing no ischemia and an EF of 77%. During the stress test he had shortness of breath and was sent for cardiopulmonary stress testing, which did not show a respiratory problem. He had an exaggerated hypertensive response with both of these stress tests (maximum systolic blood pressure of 230, per review of the notes).  He tells me he has been dx with HTN 2 years ago. He has been told he has a high heart rate last summer, but he suspects has had this for longer. He feels fatigued. He has sweating but this is common for him - has increased perspiration at baseline, no CP, no pallor/flushing, no  HA (he rarely has a HAs). Denies wheezing/flushing/diarrhea. No h/o hypokalemia/mm cramps.He has anxiety and takes Cymbalta. He appears nervous today. He drinks alcohol (beer) daily - 1-2, but mentions that he does not withdraw if he does not drink.   He has a FH of HTN in both mother and father, dx when they were older. His father had a stroke when he was 32. Sister may have HTN. A brother was an alcoholic and committed suicide in the 1990's.  He also has a history of diabetes mellitus type 2, on metformin, also fatty liver, hyperlipidemia, osteoarthritis, depression/anxiety.  Of note, his latest TSH from 04/03/2012 was 2.08, normal. Patient also has diabetes, and his last hemoglobin A1c (11/16/2012) was 6.7%, which is a little bit higher than prior, when he was 6.3%. His BMP is normal this month. He did not have a CT abd. In the past.   Review of Systems Constitutional: no weight gain/loss, + fatigue, no subjective hyperthermia/hypothermia Eyes: no blurry vision, no xerophthalmia ENT: no sore throat, no nodules palpated in throat, no dysphagia/odynophagia, no hoarseness; + tinnitus Cardiovascular: no CP/+SOB on exertion/palpitations/leg swelling Respiratory: no cough/SOB Gastrointestinal: no N/V/D/C Musculoskeletal: no muscle/+ joint aches - arthritis knee Skin: no rashes Neurological: no tremors/numbness/tingling/dizziness Psychiatric: no depression/+ anxiety  Past Medical History  Diagnosis Date  . Depression   . Hypertension   . Elevated LFTs     previously evaluated by Dr Jarold Motto; h/o "fatty liver"  . DM2 (diabetes mellitus, type 2)   . Inguinal hernia 2013    right  . Arthritis     bilateral  knees  . HLD (hyperlipidemia)    Past Surgical History  Procedure Laterality Date  . Knee surgery      age 68- right  . Achilles tendon repair  1999  . Squamous cell carcinoma excision      left knee  . Knee arthroscopy  1993    left knee  . Inguinal hernia repair  08/09/2011     Procedure: LAPAROSCOPIC INGUINAL HERNIA;  Surgeon: Clovis Pu. Cornett, MD;  Location: [facility];  Service: General;  Laterality: Right;  . Hernia repair  07/2011   History   Social History  . Marital Status: Married    Spouse Name: Dispensing optician    Number of Children: 1 stepson, 85 y/o   Occupational History  . 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 Smoking status: Current Some Day Smoker    Types: Cigars  . Smokeless tobacco: No     Comment: stopped smoking cigars 2 weeks before surgery  . Alcohol Use: 3.6 oz/week    6 Cans of beer per week - 1-2 beers daily  . Drug Use: No  . Sexually Active: Yes -- Male partner(s)   Social History Narrative   Exercise--- not regularly   Medication Sig  . aspirin EC 81 MG tablet Take 1 tablet (81 mg total) by mouth daily.  Marland Kitchen atorvastatin (LIPITOR) 20 MG tablet Take 1 tablet (20 mg total) by mouth daily.  . celecoxib (CELEBREX) 200 MG capsule Take 1 capsule (200 mg total) by mouth daily.  Marland Kitchen diltiazem (DILACOR XR) 180 MG 24 hr capsule Take 1 capsule (180 mg total) by mouth daily.  . DULoxetine (CYMBALTA) 60 MG capsule Take 1 capsule (60 mg total) by mouth daily.  Marland Kitchen glucose blood (ONE TOUCH TEST STRIPS) test strip 1 each by Other route 2 (two) times daily. accu check   . ibuprofen (ADVIL,MOTRIN) 200 MG tablet Take 400 mg by mouth every 6 (six) hours as needed. Pain   . lisinopril-hydrochlorothiazide (ZESTORETIC) 20-12.5 MG per tablet Take 1 tablet by mouth daily.  Letta Pate DELICA LANCETS MISC 2 (two) times daily. accu check    Allergies  Allergen Reactions  . Codeine Phosphate     REACTION: nausea   Family History  Problem Relation Age of Onset  . Diabetes Mother   . Hypertension Mother   . Heart disease Mother   . Stroke Father 45  . Parkinsonism Father   . Depression Brother   . Alcohol abuse Brother   . Cancer Maternal Grandmother     bone  . Heart failure Mother     Objective:   Physical  Exam BP 122/64  Pulse 109  Temp(Src) 97.6 F (36.4 C) (Oral)  Resp 12  Ht 5\' 8"  (1.727 m)  Wt 203 lb (92.08 kg)  BMI 30.87 kg/m2  SpO2 96% Wt Readings from Last 3 Encounters:  11/17/12 203 lb (92.08 kg)  08/13/12 200 lb 3.2 oz (90.81 kg)  04/30/12 199 lb 1.9 oz (90.32 kg)   Constitutional: overweight, in NAD Eyes: PERRLA, EOMI, no exophthalmos ENT: moist mucous membranes, no thyromegaly, no cervical lymphadenopathy Cardiovascular: tachycardia, RR, No MRG Respiratory: CTA B Gastrointestinal: abdomen soft, NT, ND, BS+ Musculoskeletal: no deformities, strength intact in all 4 Skin: moist, warm, no rashes Neurological: no tremor with outstretched hands, DTR normal in all 4  Assessment:     1. Hypertensive paroxysms, ?Pheochromocytoma   Epinephrine 45 pg/mL (supine <50, upright <95);   norepinephrine  1362 (supine 112-658, upright [phone]);   dopamine 74 (supine <30, uprights <30);   Total Catecholamines (N+E) 1407 (supine 123-671, upright [phone]).  Tests were drawn upright, on 10/20/2012.    Plan:     Pt with at least 1 year h/o increased BP + paroxysms during exertion, associated with tachycardia at rest and SOB with minimal exertion. Pheochromocytoma w/u started by Dr. Claris Gower, with plasma fractionated metanephrines showing a slight increase in norepinephrine and a high dopamine (2x ULN).  - we discussed about the suspicion for pheochromocytoma, common symptoms, consequences of high catecholamines, the fact that the plasma metanephrines are elevated, although for a clinically-manifest pheochromocytoma, the levels are usually 4-5x higher than the ULN. Also, plasma metanephrines are very sensitive but not very specific, with a high false positive rate, especially if not drawn with pt supine, after at least 20 min of rest, in an unstimulating environment.  - Therefore, we need to confirm the finding with a more specific test: 24h urinary catecholamines and metanephrines. This test  will rule in or out the tumor. - I explained how to do the 24 h urine collection, and given him handout from Up to date - He is not on meds that could influence the results - I will let him know about the results through MyChart - If test is abnormal, we will schedule a f/u appt. - Denies wheezing/flushing/diarrhea to suggest carcinoid spells  - No h/o hypokalemia/hypernatremia/metab. alkalosis/mm cramps to suggest primary hyperaldosteronism  Component     Latest Ref Rng 11/23/2012  Total Volume - CF 24Hr U      3500  Epinephrine, 24 hr Urine      REPORT  Norepinephrine, 24 hr Ur     15 - 100 mcg/24 h 90  Calculated Total (E+NE)     26 - 121 mcg/24 h 90  Dopamine, 24 hr Urine     52 - 480 mcg/24 h 147  Creatinine, Urine mg/day-CATEUR     0.63 - 2.50 g/24 h 1.70  Metanephrines, Ur     90 - 315 mcg/24 h 176  Normetanephrine, 24H Ur     122 - 676 mcg/24 h 586  Metaneph Total, Ur     224 - 832 mcg/24 h 762  Creatinine, Urine      52.8  Creatinine, 24H Ur     800 - 2000 mg/day 1848   Msg sent to pt:  Dear Mr. Gibbon, The results of your urine test are finally back! And they are normal. Therefore, it looks like you do not have pheochromocytoma.  Please let me know if you have any questions or concerns. Sincerely, Carlus Pavlov MD

## 2012-11-23 ENCOUNTER — Other Ambulatory Visit: Payer: 59

## 2012-11-23 DIAGNOSIS — I10 Essential (primary) hypertension: Secondary | ICD-10-CM

## 2012-11-24 LAB — CREATININE, URINE, 24 HOUR
Creatinine, 24H Ur: 1848 mg/d (ref 800–2000)
Creatinine, Urine: 52.8 mg/dL

## 2012-11-29 LAB — METANEPHRINES, URINE, 24 HOUR
Metaneph Total, Ur: 762 mcg/24 h (ref 224–832)
Normetanephrine, 24H Ur: 586 mcg/24 h (ref 122–676)

## 2012-11-29 LAB — CATECHOLAMINES, FRACTIONATED, URINE, 24 HOUR: Norepinephrine, 24 hr Ur: 90 mcg/24 h (ref 15–100)

## 2012-11-30 ENCOUNTER — Encounter: Payer: Self-pay | Admitting: Internal Medicine

## 2012-12-09 ENCOUNTER — Other Ambulatory Visit: Payer: Self-pay | Admitting: Family Medicine

## 2013-01-21 ENCOUNTER — Other Ambulatory Visit: Payer: Self-pay

## 2013-02-19 ENCOUNTER — Ambulatory Visit (INDEPENDENT_AMBULATORY_CARE_PROVIDER_SITE_OTHER): Payer: 59 | Admitting: Family Medicine

## 2013-02-19 ENCOUNTER — Encounter: Payer: Self-pay | Admitting: Family Medicine

## 2013-02-19 VITALS — BP 120/82 | HR 78 | Temp 98.7°F | Wt 197.0 lb

## 2013-02-19 DIAGNOSIS — E119 Type 2 diabetes mellitus without complications: Secondary | ICD-10-CM

## 2013-02-19 DIAGNOSIS — I10 Essential (primary) hypertension: Secondary | ICD-10-CM

## 2013-02-19 DIAGNOSIS — E785 Hyperlipidemia, unspecified: Secondary | ICD-10-CM

## 2013-02-19 DIAGNOSIS — G5600 Carpal tunnel syndrome, unspecified upper limb: Secondary | ICD-10-CM

## 2013-02-19 LAB — POCT URINALYSIS DIPSTICK
Bilirubin, UA: NEGATIVE
Ketones, UA: NEGATIVE
Leukocytes, UA: NEGATIVE
Protein, UA: NEGATIVE
Spec Grav, UA: 1.015
pH, UA: 6

## 2013-02-19 LAB — HEMOGLOBIN A1C: Hgb A1c MFr Bld: 6.6 % — ABNORMAL HIGH (ref 4.6–6.5)

## 2013-02-19 LAB — MICROALBUMIN / CREATININE URINE RATIO: Microalb, Ur: 1.4 mg/dL (ref 0.0–1.9)

## 2013-02-19 LAB — HEPATIC FUNCTION PANEL
Alkaline Phosphatase: 38 U/L — ABNORMAL LOW (ref 39–117)
Bilirubin, Direct: 0.1 mg/dL (ref 0.0–0.3)
Total Bilirubin: 0.7 mg/dL (ref 0.3–1.2)
Total Protein: 6.8 g/dL (ref 6.0–8.3)

## 2013-02-19 LAB — BASIC METABOLIC PANEL
Calcium: 9.2 mg/dL (ref 8.4–10.5)
GFR: 76.5 mL/min (ref >60.00)
Potassium: 4.3 mEq/L (ref 3.5–5.1)
Sodium: 137 mEq/L (ref 135–145)

## 2013-02-19 LAB — LIPID PANEL
HDL: 68.2 mg/dL (ref >39.00)
Total CHOL/HDL Ratio: 2
Triglycerides: 48 mg/dL (ref 0.0–149.0)
VLDL: 9.6 mg/dL (ref 0.0–40.0)

## 2013-02-19 NOTE — Assessment & Plan Note (Signed)
con't meds  Check labs 

## 2013-02-19 NOTE — Patient Instructions (Addendum)
Diabetes and Standards of Medical Care  Diabetes is complicated. You may find that your diabetes team includes a dietitian, nurse, diabetes educator, eye doctor, and more. To help everyone know what is going on and to help you get the care you deserve, the following schedule of care was developed to help keep you on track. Below are the tests, exams, vaccines, medicines, education, and plans you will need. A1c test  Performed at least 2 times a year if you are meeting treatment goals.  Performed 4 times a year if therapy has changed or if you are not meeting treatment goals. Blood pressure test  Performed at every routine medical visit. The goal is less than 120/80 mmHg. Dental exam  Follow up with the dentist regularly. Eye exam  Diagnosed with type 1 diabetes as a child: Get an exam upon reaching the age of 10 years or older and having had diabetes for 3 5 years. Yearly eye exams are recommended after that initial eye exam.  Diagnosed with type 1 diabetes as an adult: Get an exam within 5 years of diagnosis and then yearly.  Diagnosed with type 2 diabetes: Get an exam as soon as possible after the diagnosis and then yearly. Foot care exam  Visual foot exams are performed at every routine medical visit. The exams check for cuts, injuries, or other problems with the feet.  A comprehensive foot exam should be done yearly. This includes visual inspection as well as assessing foot pulses and testing for loss of sensation. Kidney function test (urine microalbumin)  Performed once a year.  Type 1 diabetes: The first test is performed 5 years after diagnosis.  Type 2 diabetes: The first test is performed at the time of diagnosis.  A serum creatinine and estimated glomerular filtration rate (eGFR) test is done once a year to tell the level of chronic kidney disease (CKD), if present. Lipid profile (Cholesterol, HDL, LDL, Triglycerides)  Performed every 5 years for most people.  The  goal for LDL is less than 100 mg/dl. If at high risk, the goal is less than 70 mg/dl.  The goal for HDL is 40 mg/dl 50 mg/dl for men and 50 mg/dl 60 mg/dl for women. An HDL cholesterol of 60 mg/dL or higher gives some protection against heart disease.  The goal for triglycerides is less than 150 mg/dl. Influenza vaccine, pneumococcal vaccine, and hepatitis B vaccine  The influenza vaccine is recommended yearly.  The pneumococcal vaccine is generally given once in a lifetime. However, there are some instances when another vaccination is recommended. Check with your caregiver.  The hepatitis B vaccine is also recommended for adults with diabetes. Diabetes self-management education  Recommended at diagnosis and ongoing as needed. Treatment plan  Reviewed at every medical visit. Document Released: 04/28/2009 Document Revised: 06/17/2012 Document Reviewed: 01/01/2011 Exit[employer] Patient Information 2014 [employer].  

## 2013-02-19 NOTE — Assessment & Plan Note (Signed)
Splints given to pt to use at night

## 2013-02-19 NOTE — Assessment & Plan Note (Signed)
Pt not checking glucose Check labs  con't meds

## 2013-02-19 NOTE — Assessment & Plan Note (Signed)
Stable , con't meds Recheck 6 months

## 2013-02-19 NOTE — Progress Notes (Signed)
  Subjective:    Patient ID: Charles Trujillo, male    DOB: 18-May-1951, 62 y.o.   MRN: [id number]  HPI HYPERTENSION Disease Monitoring Blood pressure range-130/70  Chest pain- no      Dyspnea- no Medications Compliance- good Lightheadedness- no   Edema- no   DIABETES Disease Monitoring Blood Sugar ranges-not checking Polyuria- no New Visual problems- no Medications Compliance- good Hypoglycemic symptoms- no   HYPERLIPIDEMIA Disease Monitoring See symptoms for Hypertension Medications Compliance- good RUQ pain- no  Muscle aches- no  ROS See HPI above   PMH Smoking Status noted     Review of Systems As above    Objective:   Physical Exam BP 120/82  Pulse 78  Temp(Src) 98.7 F (37.1 C) (Oral)  Wt 197 lb (89.359 kg)  BMI 29.96 kg/m2  SpO2 97% General appearance: alert, cooperative, appears stated age and no distress Neck: no adenopathy, supple, symmetrical, trachea midline and thyroid not enlarged, symmetric, no tenderness/mass/nodules Lungs: clear to auscultation bilaterally Heart: S1, S2 normal Extremities: extremities normal, atraumatic, no cyanosis or edema Psych-- no depression, no anxiety Sensory exam of the foot is normal, tested with the monofilament. Good pulses, no lesions or ulcers, good peripheral pulses.         Assessment & Plan:

## 2013-02-23 ENCOUNTER — Encounter: Payer: Self-pay | Admitting: Family Medicine

## 2013-05-20 ENCOUNTER — Other Ambulatory Visit: Payer: Self-pay

## 2013-05-24 ENCOUNTER — Other Ambulatory Visit: Payer: Self-pay | Admitting: Family Medicine

## 2013-05-27 ENCOUNTER — Telehealth: Payer: Self-pay | Admitting: Family Medicine

## 2013-05-27 NOTE — Telephone Encounter (Signed)
Patient states that he was once on Abilify but had to be taken off because he was unable to afford it. Wants to know if he can be given a prescription for this now that he is able to afford. Please advise.

## 2013-05-27 NOTE — Telephone Encounter (Signed)
Medication is not on current or previous med list.  Will need to wait for PCP to determine whether she wants to start medication

## 2013-05-28 ENCOUNTER — Other Ambulatory Visit: Payer: Self-pay | Admitting: Family Medicine

## 2013-05-28 DIAGNOSIS — F329 Major depressive disorder, single episode, unspecified: Secondary | ICD-10-CM

## 2013-05-28 MED ORDER — ARIPIPRAZOLE 5 MG PO TABS: 5.0000 mg | ORAL_TABLET | Freq: Every day | ORAL | Status: DC

## 2013-05-28 NOTE — Telephone Encounter (Signed)
2 mg sample given to pt wife---- 5 mg sent to pharmacy

## 2013-06-05 ENCOUNTER — Encounter: Payer: Self-pay | Admitting: Family Medicine

## 2013-06-28 ENCOUNTER — Other Ambulatory Visit: Payer: Self-pay | Admitting: Family Medicine

## 2013-07-09 ENCOUNTER — Other Ambulatory Visit: Payer: Self-pay | Admitting: Family Medicine

## 2013-08-19 ENCOUNTER — Encounter: Payer: Self-pay | Admitting: Family Medicine

## 2013-08-19 ENCOUNTER — Ambulatory Visit (INDEPENDENT_AMBULATORY_CARE_PROVIDER_SITE_OTHER): Payer: 59 | Admitting: Family Medicine

## 2013-08-19 VITALS — BP 120/76 | HR 87 | Temp 98.0°F | Wt 207.0 lb

## 2013-08-19 DIAGNOSIS — E1159 Type 2 diabetes mellitus with other circulatory complications: Secondary | ICD-10-CM

## 2013-08-19 DIAGNOSIS — I798 Other disorders of arteries, arterioles and capillaries in diseases classified elsewhere: Secondary | ICD-10-CM

## 2013-08-19 DIAGNOSIS — E1151 Type 2 diabetes mellitus with diabetic peripheral angiopathy without gangrene: Secondary | ICD-10-CM

## 2013-08-19 DIAGNOSIS — E785 Hyperlipidemia, unspecified: Secondary | ICD-10-CM

## 2013-08-19 DIAGNOSIS — I10 Essential (primary) hypertension: Secondary | ICD-10-CM

## 2013-08-19 DIAGNOSIS — N529 Male erectile dysfunction, unspecified: Secondary | ICD-10-CM

## 2013-08-19 DIAGNOSIS — E669 Obesity, unspecified: Secondary | ICD-10-CM | POA: Insufficient documentation

## 2013-08-19 LAB — BASIC METABOLIC PANEL
BUN: 14 mg/dL (ref 6–23)
CHLORIDE: 107 meq/L (ref 96–112)
CO2: 27 meq/L (ref 19–32)
CREATININE: 1.1 mg/dL (ref 0.4–1.5)
Calcium: 9 mg/dL (ref 8.4–10.5)
GFR: 75.55 mL/min (ref >60.00)
Glucose, Bld: 130 mg/dL — ABNORMAL HIGH (ref 70–99)
Potassium: 4.3 mEq/L (ref 3.5–5.1)
SODIUM: 139 meq/L (ref 135–145)

## 2013-08-19 LAB — HEPATIC FUNCTION PANEL
ALT: 50 U/L (ref 0–53)
AST: 44 U/L — AB (ref 0–37)
Albumin: 3.9 g/dL (ref 3.5–5.2)
Alkaline Phosphatase: 39 U/L (ref 39–117)
BILIRUBIN DIRECT: 0.1 mg/dL (ref 0.0–0.3)
BILIRUBIN TOTAL: 0.6 mg/dL (ref 0.3–1.2)
Total Protein: 6.7 g/dL (ref 6.0–8.3)

## 2013-08-19 LAB — LIPID PANEL
CHOL/HDL RATIO: 2
Cholesterol: 133 mg/dL (ref 0–200)
HDL: 60.8 mg/dL (ref >39.00)
LDL CALC: 61 mg/dL (ref 0–99)
Triglycerides: 55 mg/dL (ref 0.0–149.0)
VLDL: 11 mg/dL (ref 0.0–40.0)

## 2013-08-19 LAB — HEMOGLOBIN A1C: Hgb A1c MFr Bld: 6.5 % (ref 4.6–6.5)

## 2013-08-19 MED ORDER — SILDENAFIL CITRATE 100 MG PO TABS: 100.0000 mg | ORAL_TABLET | ORAL | Status: DC | PRN

## 2013-08-19 NOTE — Patient Instructions (Signed)

## 2013-08-19 NOTE — Progress Notes (Signed)
   Subjective:    Patient ID: Charles Trujillo, male    DOB: May 25, 1951, 63 y.o.   MRN: [id number]  HPI  HPI HYPERTENSION  Blood pressure range-not checking regularly  Chest pain- no      Dyspnea- no Lightheadedness- no   Edema- no Other side effects - no   Medication compliance: good Low salt diet- no  DIABETES  Blood Sugar ranges-not checking regularly  Polyuria- no New Visual problems- no Hypoglycemic symptoms- no Other side effects-no Medication compliance - good Last eye exam- 01/2013 Foot exam- today  HYPERLIPIDEMIA  Medication compliance- good RUQ pain- no  Muscle aches- no Other side effects-no  ROS See HPI above   PMH Smoking Status noted        Review of Systems    as above Objective:   Physical Exam  BP 120/76  Pulse 87  Temp(Src) 98 F (36.7 C) (Oral)  Wt 207 lb (93.895 kg)  SpO2 95% General appearance: alert, cooperative, appears stated age and no distress Ears: normal TM's and external ear canals both ears Nose: Nares normal. Septum midline. Mucosa normal. No drainage or sinus tenderness. Throat: lips, mucosa, and tongue normal; teeth and gums normal Neck: no adenopathy, supple, symmetrical, trachea midline and thyroid not enlarged, symmetric, no tenderness/mass/nodules Lungs: clear to auscultation bilaterally Heart: S1, S2 normal Extremities: extremities normal, atraumatic, no cyanosis or edema      Assessment & Plan:  1. Erectile dysfunction Refill meds - sildenafil (VIAGRA) 100 MG tablet; Take 1 tablet (100 mg total) by mouth as needed for erectile dysfunction. Take 100 mg by mouth daily as needed for Erectile Dysfunction.  Dispense: 10 tablet; Refill: 2  2. DM (diabetes mellitus) type II controlled peripheral vascular disorder con't meds check labs Lab Results  Component Value Date   HGBA1C 6.6* 02/19/2013    - Basic metabolic panel - Hemoglobin A1c  3. Other and unspecified hyperlipidemia con't meds Lab Results    Component Value Date   CHOL 140 02/19/2013   HDL 68.20 02/19/2013   LDLCALC 62 02/19/2013   LDLDIRECT 143.5 02/01/2011   TRIG 48.0 02/19/2013   CHOLHDL 2 02/19/2013    - Hepatic function panel - Lipid panel  4. HTN (hypertension) Stable  con't meds

## 2013-08-19 NOTE — Progress Notes (Signed)
Pre visit review using our clinic review tool, if applicable. No additional management support is needed unless otherwise documented below in the visit note. 

## 2013-08-20 ENCOUNTER — Encounter: Payer: Self-pay | Admitting: Family Medicine

## 2013-08-20 ENCOUNTER — Telehealth: Payer: Self-pay

## 2013-08-20 ENCOUNTER — Telehealth: Payer: Self-pay | Admitting: Family Medicine

## 2013-08-20 NOTE — Telephone Encounter (Signed)
Relevant patient education mailed to patient.  

## 2013-08-20 NOTE — Telephone Encounter (Signed)
Relevant patient education assigned to patient using Emmi. ° °

## 2013-09-01 ENCOUNTER — Ambulatory Visit: Payer: 59 | Admitting: Family Medicine

## 2013-09-03 ENCOUNTER — Other Ambulatory Visit: Payer: Self-pay

## 2013-09-03 MED ORDER — CELECOXIB 200 MG PO CAPS: ORAL_CAPSULE | ORAL | Status: DC

## 2013-09-03 NOTE — Telephone Encounter (Signed)
Rf request for generic Celebrex. Rx sent      KP

## 2013-09-07 ENCOUNTER — Ambulatory Visit: Payer: 59 | Admitting: Family Medicine

## 2013-09-15 ENCOUNTER — Ambulatory Visit (INDEPENDENT_AMBULATORY_CARE_PROVIDER_SITE_OTHER)
Admission: RE | Admit: 2013-09-15 | Discharge: 2013-09-15 | Disposition: A | Discharge status: Home / Self Care | Payer: 59 | Source: Ambulatory Visit | Attending: Family Medicine | Admitting: Family Medicine

## 2013-09-15 ENCOUNTER — Encounter: Payer: Self-pay | Admitting: Family Medicine

## 2013-09-15 ENCOUNTER — Ambulatory Visit (INDEPENDENT_AMBULATORY_CARE_PROVIDER_SITE_OTHER): Payer: 59 | Admitting: Family Medicine

## 2013-09-15 VITALS — BP 120/66 | HR 95 | Temp 97.5°F | Resp 16 | Wt 206.8 lb

## 2013-09-15 DIAGNOSIS — M25569 Pain in unspecified knee: Secondary | ICD-10-CM

## 2013-09-15 DIAGNOSIS — M1710 Unilateral primary osteoarthritis, unspecified knee: Secondary | ICD-10-CM | POA: Insufficient documentation

## 2013-09-15 NOTE — Patient Instructions (Signed)
Good to meet you Exercises most days of the week.  Get xrays downstairs.  Take tylenol 650 mg three times a day is the best evidence based medicine we have for arthritis.  Glucosamine sulfate 750mg  twice a day is a supplement that has been shown to help moderate to severe arthritis. Vitamin D 2000 IU daily Fish oil 2 grams daily.  Tumeric 500mg  twice daily.  Capsaicin topically up to four times a day may also help with pain. Cortisone injections are an option if these interventions do not seem to make a difference or need more relief.  If cortisone injections do not help, there are different types of shots that may help but they take longer to take effect.  We can discuss this at follow up.  It's important that you continue to stay active. Controlling your weight is important.  Consider physical therapy to strengthen muscles around the joint that hurts to take pressure off of the joint itself. Shoe inserts with good arch support may be helpful.  [employer] orthotics at [employer] could help.  Water aerobics and cycling with low resistance are the best two types of exercise for arthritis. Come back and see me in 3-4 weeks.

## 2013-09-15 NOTE — Progress Notes (Signed)
  [name] [address] [place], [place] [address] Phone: [phone] Subjective:     CC: Bilateral knee pain  [id number] Charles Trujillo is a 63 y.o. male coming in with complaint of bilateral knee pain. Patient has had this pain in his knees for quite some time. Now it seems began worse with him even with daily walking. Patient states that the pain is mostly on the medial aspect of the knee. Patient denies any numbness or tingling. Patient denies any radiation down the leg. Patient states that it is starting to affect some of his daily activity and does not want to walk quite as much secondary to pain. Patient has tried some ibuprofen intermittently with minimal benefit. Patient has not tried any bracing or any icing. Patient denies any true injury to this area. Patient did have knee surgery when he was 14 on the right knee but does not remember exactly what. Denies any swelling. Patient rates the severity a 7/10. Sometimes has nighttime awakening. Describes the pain as a dull aching sensation    Past medical history, social, surgical and family history all reviewed in electronic medical record.   Review of Systems: No headache, visual changes, nausea, vomiting, diarrhea, constipation, dizziness, abdominal pain, skin rash, fevers, chills, night sweats, weight loss, swollen lymph nodes, body aches, joint swelling, muscle aches, chest pain, shortness of breath, mood changes.   Objective Blood pressure 120/66, pulse 95, temperature 97.5 F (36.4 C), temperature source Oral, resp. rate 16, weight 206 lb 12.8 oz (93.804 kg), SpO2 96.00%.  General: No apparent distress alert and oriented x3 mood and affect normal, dressed appropriately.  HEENT: Pupils equal, extraocular movements intact  Respiratory: Patient's speak in full sentences and does not appear short of breath  Cardiovascular: No lower extremity edema, non tender, no erythema  Skin: Warm dry intact  with no signs of infection or rash on extremities or on axial skeleton.  Abdomen: Soft nontender  Neuro: Cranial nerves II through XII are intact, neurovascularly intact in all extremities with 2+ DTRs and 2+ pulses.  Lymph: No lymphadenopathy of posterior or anterior cervical chain or axillae bilaterally.  Gait normal with good balance and coordination.  MSK:  Non tender with full range of motion and good stability and symmetric strength and tone of shoulders, elbows, wrist, hip,  and ankles bilaterally.  Knee: Bilateral Patient has significant breakdown of the medial compartment bilaterally. Severe tenderness to palpation the bilateral medial joint lines ROM full in flexion and extension and lower leg rotation. Ligaments with solid consistent endpoints including ACL, PCL, LCL, MCL. Negative Mcmurray's, Apley's, and Thessalonian tests.  painful patellar compression. Patellar glide with moderate crepitus. Patellar and quadriceps tendons unremarkable. Hamstring and quadriceps strength is normal.   After informed written and verbal consent, patient was seated on exam table. Right knee was prepped with alcohol swab and utilizing anterolateral approach, patient's right knee space was injected with 4:1  marcaine 0.5%: Kenalog 40mg /dL. Patient tolerated the procedure well without immediate complications.  After informed written and verbal consent, patient was seated on exam table. Left knee was prepped with alcohol swab and utilizing anterolateral approach, patient's left knee space was injected with 4:1  marcaine 0.5%: Kenalog 40mg /dL. Patient tolerated the procedure well without immediate complications.    Impression and Recommendations:     This case required medical decision making of moderate complexity.

## 2013-09-15 NOTE — Assessment & Plan Note (Addendum)
Injected both knees today. Likely severe arthritis. Discussed home exercise program and was given a handout. Icing protocol noted. Discussed over-the-counter medications he can be beneficial. Discussed orthotics. Come back again in 3-4 weeks. If he continues to have pain we'll consider Visco supplementation.  X-rays were ordered, reviewed and interpreted by me today. X-rays the patient's knee show severe narrowing of the medial compartment bilaterally. This is consistent with uni-compartment aend stage osteoarthritis of the knees bilaterally.

## 2013-09-15 NOTE — Progress Notes (Signed)
Pre visit review using our clinic review tool, if applicable. No additional management support is needed unless otherwise documented below in the visit note. 

## 2013-09-22 ENCOUNTER — Encounter: Payer: Self-pay | Admitting: Family Medicine

## 2013-09-22 MED ORDER — GLUCOSE BLOOD VI STRP: ORAL_STRIP | Status: DC

## 2013-10-19 ENCOUNTER — Other Ambulatory Visit: Payer: Self-pay | Admitting: Family Medicine

## 2013-12-08 ENCOUNTER — Other Ambulatory Visit: Payer: Self-pay | Admitting: Family Medicine

## 2013-12-08 NOTE — Telephone Encounter (Signed)
Rx's sent to the pharmacy by e-script.//AB/CMA 

## 2013-12-10 ENCOUNTER — Ambulatory Visit (INDEPENDENT_AMBULATORY_CARE_PROVIDER_SITE_OTHER): Payer: 59 | Admitting: Family Medicine

## 2013-12-10 ENCOUNTER — Encounter: Payer: Self-pay | Admitting: Family Medicine

## 2013-12-10 VITALS — BP 130/70 | HR 84 | Ht 68.0 in | Wt 208.0 lb

## 2013-12-10 DIAGNOSIS — M1710 Unilateral primary osteoarthritis, unspecified knee: Secondary | ICD-10-CM

## 2013-12-10 DIAGNOSIS — G5600 Carpal tunnel syndrome, unspecified upper limb: Secondary | ICD-10-CM

## 2013-12-10 NOTE — Progress Notes (Signed)
[name] [place], [place] [address] Phone: [phone] Subjective:     CC: Bilateral knee pain followup  [id number] Charles [place] is a 63 y.o. male coming in with complaint of bilateral knee pain. 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 Patient was seen 3 months ago and was given steroid injections. Patient was also given home exercise program as well as over-the-counter medications and suggestions of orthotics. Patient states he is doing significantly better at that time. Patient has been very active but some foreshortening of the course last several weeks he started having increasing pain again. Patient denies any new symptoms just re\re exacerbation of the previous symptoms. Patient is still resting comfortably at night. Patient will remain more active. Patient is not doing the exercises on a regular basis.  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 Patient has been sleeping with braces as well as doing some icing without any significant improvement. Patient feels that the problem is getting worse. Denies any weakness but states that the numbness seems to last longer in does wake him up at night. Patient is a severity at 8/10 mostly been left hand with very minimal symptoms in the right hand.    Past medical history, social, surgical and family history all reviewed in electronic medical record.   Review of Systems: No headache, visual changes, nausea, vomiting, diarrhea, constipation, dizziness, abdominal pain, skin rash, fevers, chills, night sweats, weight loss, swollen lymph nodes, body aches, joint swelling, muscle aches, chest pain, shortness of breath, mood changes.   Objective Blood pressure 130/70, pulse 84, height 5\' 8"  (1.727 m), weight 208 lb (94.348 kg), SpO2 95.00%.  General: No apparent distress alert and oriented x3 mood and  affect normal, dressed appropriately.  HEENT: Pupils equal, extraocular movements intact  Respiratory: Patient's speak in full sentences and does not appear short of breath  Cardiovascular: No lower extremity edema, non tender, no erythema  Skin: Warm dry intact with no signs of infection or rash on extremities or on axial skeleton.  Abdomen: Soft nontender  Neuro: Cranial nerves II through XII are intact, neurovascularly intact in all extremities with 2+ DTRs and 2+ pulses.  Lymph: No lymphadenopathy of posterior or anterior cervical chain or axillae bilaterally.  Gait normal with good balance and coordination.  MSK:  Non tender with full range of motion and good stability and symmetric strength and tone of shoulders, elbows,  hip,  and ankles bilaterally.  Knee: Bilateral Patient has significant breakdown of the medial compartment bilaterally. Severe tenderness to palpation the bilateral medial joint lines ROM full in flexion and extension and lower leg rotation. Ligaments with solid consistent endpoints including ACL, PCL, LCL, MCL. Negative Mcmurray's, Apley's, and Thessalonian tests.  painful patellar compression. Patellar glide with moderate crepitus. Patellar and quadriceps tendons unremarkable. Hamstring and quadriceps strength is normal.   Left wrist exam shows the patient does have a positive Tinel's. Patient has good grip strength and neurovascularly intact distally. Full range of motion of the wrist. Contralateral wrist has a negative Tinel sign.  After informed written and verbal consent, patient was seated on exam table. Right knee was prepped with alcohol swab and utilizing anterolateral approach, patient's right knee space was injected with 4:1  marcaine 0.5%: Kenalog 40mg /dL. Patient tolerated the procedure well without immediate complications.  After informed written and verbal consent, patient was seated on exam  table. Left knee was prepped with alcohol swab and utilizing  anterolateral approach, patient's left knee space was injected with 4:1  marcaine 0.5%: Kenalog 40mg /dL. Patient tolerated the procedure well without immediate complications.  MSK US performed of: Left wrist This study was ordered, performed, and interpreted by Charlann Boxer D.O.  Wrist: All extensor compartments visualized and tendons all normal in appearance without fraying, tears, or sheath effusions. No effusion seen. TFCC intact. Scapholunate ligament intact. Carpal tunnel visualized and median nerve a significantly large with diffusion of the tendon sheath. This measures 1.69 cm and area.  Power doppler signal normal.  IMPRESSION:  Carpal tunnel syndrome left wrist  After verbal consent patient was prepped with alcohol swabs and under ultrasound guidance did have an injection of 0.5 cc of Kenalog 40 mg/dL and 0.5 cc of Marcaine into the neurosheath around the median nerve. Patient tolerated the procedure very well and post injection instructions given.     Impression and Recommendations:     This case required medical decision making of moderate complexity.

## 2013-12-10 NOTE — Patient Instructions (Signed)
It is great to see you and tell Jilda we miss her.  Continue the exercises 3 times  A week Continue the medications over the counter.  Try the topical medicine 2 times  A day.  Try the exercises for the wrist as well Try the brace.  Come back again in 4 weeks.

## 2013-12-10 NOTE — Assessment & Plan Note (Signed)
Patient does have severe arthritis of the knees bilaterally left greater than right. Patient was given another steroid injections again. Encourage him to do exercises on her regular basis. Patient was given and fitted by a knee brace by myself today. We discussed icing protocol and over-the-counter medications that can be beneficial. Patient is trial these interventions and come back in 3-4 weeks. If patient is continuing to have pain he would have failed all conservative therapy and likely will need Synvisc more viscous supplementation.  Spent greater than 25 minutes with patient face-to-face and had greater than 50% of counseling including as described above in assessment and plan.

## 2013-12-10 NOTE — Assessment & Plan Note (Signed)
Patient did have injection today. Patient is to wear the braces day and night for the next 48 hours nightly thereafter. We discussed icing and was given home exercise program on a handout today. Patient will try these interventions and will followup again in 3 weeks for further evaluation and treatment.

## 2013-12-14 ENCOUNTER — Other Ambulatory Visit: Payer: Self-pay | Admitting: Family Medicine

## 2014-01-03 ENCOUNTER — Other Ambulatory Visit: Payer: Self-pay | Admitting: Family Medicine

## 2014-02-01 ENCOUNTER — Encounter: Payer: Self-pay | Admitting: Family Medicine

## 2014-03-10 ENCOUNTER — Ambulatory Visit (INDEPENDENT_AMBULATORY_CARE_PROVIDER_SITE_OTHER): Payer: 59 | Admitting: Family Medicine

## 2014-03-10 ENCOUNTER — Encounter: Payer: Self-pay | Admitting: Family Medicine

## 2014-03-10 VITALS — BP 142/84 | HR 78 | Ht 68.0 in | Wt 214.0 lb

## 2014-03-10 DIAGNOSIS — M1710 Unilateral primary osteoarthritis, unspecified knee: Secondary | ICD-10-CM

## 2014-03-10 DIAGNOSIS — M1712 Unilateral primary osteoarthritis, left knee: Secondary | ICD-10-CM

## 2014-03-10 NOTE — Assessment & Plan Note (Signed)
Patient was given an injection into his left knee today. We discussed that starting the Synvisc injections we will see benefit with each injection hopefully. Patient will continue the other conservative therapy. Patient has failed everything including physical therapy, icing, home exercises and bracing. Patient has pictures showing severe osteoarthritic changes.

## 2014-03-10 NOTE — Patient Instructions (Signed)
Good to see you Continue the exercises Ice 20 minutes 2 times daily. Usually after activity and before bed. Continue the celebrex  Try the pennsaid twice daily.  Come back in 1 week.

## 2014-03-10 NOTE — Progress Notes (Signed)
  [facility] [place], [place] [address] Phone: [phone] Subjective:     CC: Bilateral knee pain followup left knee worse.  [id number] Charles [place] is a 63 y.o. male coming in with complaint of bilateral knee pain. 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 Patient was seen 3 months ago and was given steroid injections. This is his second round patient has continued to conservative therapy including a home exercises, degenerative hip formal physical therapy and wants, icing, as well as bracing. Patient states he is not making any significant improvement. States that the injection helped for approximately one month. Continued same pain does with more severity. Patient citron now has left knee is hurting him more.   Patient states his hand is doing significantly better.    Past medical history, social, surgical and family history all reviewed in electronic medical record.   Review of Systems: No headache, visual changes, nausea, vomiting, diarrhea, constipation, dizziness, abdominal pain, skin rash, fevers, chills, night sweats, weight loss, swollen lymph nodes, body aches, joint swelling, muscle aches, chest pain, shortness of breath, mood changes.   Objective Blood pressure 142/84, pulse 78, height 5\' 8"  (1.727 m), weight 214 lb (97.07 kg), SpO2 97.00%.  General: No apparent distress alert and oriented x3 mood and affect normal, dressed appropriately.  HEENT: Pupils equal, extraocular movements intact  Respiratory: Patient's speak in full sentences and does not appear short of breath  Cardiovascular: No lower extremity edema, non tender, no erythema  Skin: Warm dry intact with no signs of infection or rash on extremities or on axial skeleton.  Abdomen: Soft nontender  Neuro: Cranial nerves II through XII are intact, neurovascularly intact in all extremities with 2+ DTRs and 2+  pulses.  Lymph: No lymphadenopathy of posterior or anterior cervical chain or axillae bilaterally.  Gait normal with good balance and coordination.  MSK:  Non tender with full range of motion and good stability and symmetric strength and tone of shoulders, elbows,  hip,  and ankles bilaterally.  Knee: Bilateral Patient has significant breakdown of the medial compartment bilaterally. Severe tenderness to palpation the bilateral medial joint lines ROM full in flexion and extension and lower leg rotation. Ligaments with solid consistent endpoints including ACL, PCL, LCL, MCL. Negative Mcmurray's, Apley's, and Thessalonian tests.  painful patellar compression. Patellar glide with moderate crepitus. Patellar and quadriceps tendons unremarkable. Hamstring and quadriceps strength is normal.    After informed written and verbal consent, patient was seated on exam table. Right knee was prepped with alcohol swab and utilizing anterolateral approach, patient's left knee space was injected 16 mg/2.5 mL of Synvisc (sodium hyaluronate) in a prefilled syringe was injected easily into the knee through a 22-gauge needle.. Patient tolerated the procedure well without immediate complications.   Spent greater than 25 minutes with patient face-to-face and had greater than 50% of counseling including as described above in assessment and plan.   Impression and Recommendations:     This case required medical decision making of moderate complexity.

## 2014-03-17 ENCOUNTER — Ambulatory Visit (INDEPENDENT_AMBULATORY_CARE_PROVIDER_SITE_OTHER): Payer: 59 | Admitting: Family Medicine

## 2014-03-17 ENCOUNTER — Encounter: Payer: Self-pay | Admitting: Family Medicine

## 2014-03-17 VITALS — BP 146/86 | HR 81 | Ht 68.0 in | Wt 214.0 lb

## 2014-03-17 DIAGNOSIS — M1712 Unilateral primary osteoarthritis, left knee: Secondary | ICD-10-CM

## 2014-03-17 DIAGNOSIS — G5600 Carpal tunnel syndrome, unspecified upper limb: Secondary | ICD-10-CM

## 2014-03-17 DIAGNOSIS — M1710 Unilateral primary osteoarthritis, unspecified knee: Secondary | ICD-10-CM

## 2014-03-17 DIAGNOSIS — G5601 Carpal tunnel syndrome, right upper limb: Secondary | ICD-10-CM

## 2014-03-17 NOTE — Patient Instructions (Signed)
Continue what you are doing.  Continue the brace at night for next 2 weeks.  See you in 1 week.

## 2014-03-17 NOTE — Progress Notes (Signed)
  [name] [place], [place] [address] Phone: [phone] Subjective:     CC: Bilateral knee pain followup left knee worse.  [id number] Charles [place] is a 63 y.o. male coming in with complaint of bilateral knee pain. 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 Patient was seen 3 months ago and was given steroid injections. This is his second round patient has continued to conservative therapy including a home exercises, degenerative hip formal physical therapy and wants, icing, as well as bracing. Patient states he is not making any significant improvement. States that the injection helped for approximately one month. Continued same pain does with more severity. Patient did have first injection of Synvisc previously. One week ago. Patient states no significant treatment from left injection.   Patient states his hand is doing significantly better.    Past medical history, social, surgical and family history all reviewed in electronic medical record.   Review of Systems: No headache, visual changes, nausea, vomiting, diarrhea, constipation, dizziness, abdominal pain, skin rash, fevers, chills, night sweats, weight loss, swollen lymph nodes, body aches, joint swelling, muscle aches, chest pain, shortness of breath, mood changes.   Objective Blood pressure 146/86, pulse 81, height 5\' 8"  (1.727 m), weight 214 lb (97.07 kg), SpO2 97.00%.  General: No apparent distress alert and oriented x3 mood and affect normal, dressed appropriately.  HEENT: Pupils equal, extraocular movements intact  Respiratory: Patient's speak in full sentences and does not appear short of breath  Cardiovascular: No lower extremity edema, non tender, no erythema  Skin: Warm dry intact with no signs of infection or rash on extremities or on axial skeleton.  Abdomen: Soft nontender  Neuro: Cranial nerves II through  XII are intact, neurovascularly intact in all extremities with 2+ DTRs and 2+ pulses.  Lymph: No lymphadenopathy of posterior or anterior cervical chain or axillae bilaterally.  Gait normal with good balance and coordination.  MSK:  Non tender with full range of motion and good stability and symmetric strength and tone of shoulders, elbows,  hip,  and ankles bilaterally.  Knee: Bilateral Patient has significant breakdown of the medial compartment bilaterally. Severe tenderness to palpation the bilateral medial joint lines ROM full in flexion and extension and lower leg rotation. Ligaments with solid consistent endpoints including ACL, PCL, LCL, MCL. Negative Mcmurray's, Apley's, and Thessalonian tests.  painful patellar compression. Patellar glide with moderate crepitus. Patellar and quadriceps tendons unremarkable. Hamstring and quadriceps strength is normal.    After informed written and verbal consent, patient was seated on exam table. Right knee was prepped with alcohol swab and utilizing anterolateral approach, patient's left knee space was injected 16 mg/2.5 mL of Synvisc (sodium hyaluronate) in a prefilled syringe was injected easily into the knee through a 22-gauge needle.. Patient tolerated the procedure well without immediate complications.     Impression and Recommendations:

## 2014-03-17 NOTE — Assessment & Plan Note (Signed)
The patient was given second in a series of 3 Synvisc injections. Patient will continue the conservative therapy and will come back in one week for third and final injection of the Synvisc.

## 2014-03-23 ENCOUNTER — Encounter: Payer: Self-pay | Admitting: Family Medicine

## 2014-03-23 ENCOUNTER — Ambulatory Visit (INDEPENDENT_AMBULATORY_CARE_PROVIDER_SITE_OTHER): Payer: 59 | Admitting: Family Medicine

## 2014-03-23 VITALS — BP 152/86 | HR 107 | Ht 68.0 in | Wt 214.0 lb

## 2014-03-23 DIAGNOSIS — M1712 Unilateral primary osteoarthritis, left knee: Secondary | ICD-10-CM

## 2014-03-23 DIAGNOSIS — M1710 Unilateral primary osteoarthritis, unspecified knee: Secondary | ICD-10-CM

## 2014-03-23 NOTE — Patient Instructions (Signed)
Good to see you as always Tell your better half hello.  Come back in 1 month or can come back sooner if you want to start the other knee.

## 2014-03-23 NOTE — Progress Notes (Signed)
  [name] [place], [place] [address] Phone: [phone] Subjective:     CC: Bilateral knee pain followup left knee worse.  [id number] Charles [place] is a 63 y.o. male coming in with complaint of bilateral knee pain. 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 Patient was seen 3 months ago and was given steroid injections. This is his second round patient has continued to conservative therapy including a home exercises, degenerative hip formal physical therapy and wants, icing, as well as bracing. Patient is here for third and final Synvisc injection. Patient states   Patient states his hand is doing significantly better.    Past medical history, social, surgical and family history all reviewed in electronic medical record.   Review of Systems: No headache, visual changes, nausea, vomiting, diarrhea, constipation, dizziness, abdominal pain, skin rash, fevers, chills, night sweats, weight loss, swollen lymph nodes, body aches, joint swelling, muscle aches, chest pain, shortness of breath, mood changes.   Objective There were no vitals taken for this visit.  General: No apparent distress alert and oriented x3 mood and affect normal, dressed appropriately.  HEENT: Pupils equal, extraocular movements intact  Respiratory: Patient's speak in full sentences and does not appear short of breath  Cardiovascular: No lower extremity edema, non tender, no erythema  Skin: Warm dry intact with no signs of infection or rash on extremities or on axial skeleton.  Abdomen: Soft nontender  Neuro: Cranial nerves II through XII are intact, neurovascularly intact in all extremities with 2+ DTRs and 2+ pulses.  Lymph: No lymphadenopathy of posterior or anterior cervical chain or axillae bilaterally.  Gait normal with good balance and coordination.  MSK:  Non tender with full range of motion and good  stability and symmetric strength and tone of shoulders, elbows,  hip,  and ankles bilaterally.  Knee: Bilateral Patient has significant breakdown of the medial compartment bilaterally. Severe tenderness to palpation the bilateral medial joint lines ROM full in flexion and extension and lower leg rotation. Ligaments with solid consistent endpoints including ACL, PCL, LCL, MCL. Negative Mcmurray's, Apley's, and Thessalonian tests.  painful patellar compression. Patellar glide with moderate crepitus. Patellar and quadriceps tendons unremarkable. Hamstring and quadriceps strength is normal.    After informed written and verbal consent, patient was seated on exam table. Right knee was prepped with alcohol swab and utilizing anterolateral approach, patient's left knee space was injected 16 mg/2.5 mL of Synvisc (sodium hyaluronate) in a prefilled syringe was injected easily into the knee through a 22-gauge needle.. Patient tolerated the procedure well without immediate complications.     Impression and Recommendations:

## 2014-03-23 NOTE — Assessment & Plan Note (Signed)
3rd and final synvisc injections today.  Continue conservative therapy.  Ice HEP, topical medicine and supplements.  Come back in 1 month.   Could do on contralateral side as well.

## 2014-03-28 ENCOUNTER — Telehealth: Payer: Self-pay | Admitting: *Deleted

## 2014-03-28 NOTE — Telephone Encounter (Signed)
VM left for pt in regards to DB f/u for a BP check  

## 2014-04-20 ENCOUNTER — Ambulatory Visit (INDEPENDENT_AMBULATORY_CARE_PROVIDER_SITE_OTHER): Payer: 59 | Admitting: *Deleted

## 2014-04-20 ENCOUNTER — Encounter: Payer: Self-pay | Admitting: *Deleted

## 2014-04-20 ENCOUNTER — Encounter: Payer: Self-pay | Admitting: Family Medicine

## 2014-04-20 VITALS — BP 118/80 | HR 74

## 2014-04-20 DIAGNOSIS — E785 Hyperlipidemia, unspecified: Secondary | ICD-10-CM

## 2014-04-20 DIAGNOSIS — E119 Type 2 diabetes mellitus without complications: Secondary | ICD-10-CM

## 2014-04-20 LAB — HEPATIC FUNCTION PANEL
ALBUMIN: 3.7 g/dL (ref 3.5–5.2)
ALK PHOS: 47 U/L (ref 39–117)
ALT: 49 U/L (ref 0–53)
AST: 43 U/L — ABNORMAL HIGH (ref 0–37)
Bilirubin, Direct: 0.1 mg/dL (ref 0.0–0.3)
TOTAL PROTEIN: 7.2 g/dL (ref 6.0–8.3)
Total Bilirubin: 0.6 mg/dL (ref 0.2–1.2)

## 2014-04-20 LAB — LIPID PANEL
CHOLESTEROL: 152 mg/dL (ref 0–200)
HDL: 58.7 mg/dL (ref >39.00)
LDL Cholesterol: 83 mg/dL (ref 0–99)
NonHDL: 93.3
Total CHOL/HDL Ratio: 3
Triglycerides: 54 mg/dL (ref 0.0–149.0)
VLDL: 10.8 mg/dL (ref 0.0–40.0)

## 2014-04-20 LAB — BASIC METABOLIC PANEL
BUN: 14 mg/dL (ref 6–23)
CALCIUM: 9.2 mg/dL (ref 8.4–10.5)
CO2: 21 mEq/L (ref 19–32)
CREATININE: 1.1 mg/dL (ref 0.4–1.5)
Chloride: 101 mEq/L (ref 96–112)
GFR: 71.45 mL/min (ref >60.00)
Glucose, Bld: 143 mg/dL — ABNORMAL HIGH (ref 70–99)
Potassium: 4.2 mEq/L (ref 3.5–5.1)
Sodium: 134 mEq/L — ABNORMAL LOW (ref 135–145)

## 2014-04-20 LAB — HEMOGLOBIN A1C: HEMOGLOBIN A1C: 6.9 % — AB (ref 4.6–6.5)

## 2014-04-20 NOTE — Progress Notes (Signed)
Pt was asked to came in for BP check.  While here pt was informed that he was due for his 6 month follow-up,so he had fasting bloodwork drawn and microalbunmin collected per Kim.//AB/CMA

## 2014-04-20 NOTE — Progress Notes (Signed)
   Subjective:    Patient ID: Charles [place], male    DOB: 1950/11/28, 63 y.o.   MRN: [id number]  HPI    Review of Systems     Objective:   Physical Exam        Assessment & Plan:

## 2014-04-20 NOTE — Patient Instructions (Signed)
Pt was asked to schedule an appt for 6 month follow-up with Dr. Minette Brine

## 2014-04-20 NOTE — Telephone Encounter (Signed)
With his age and list of medical problems would not wait until Dr. Etter Sjogren gets back. Would be seen sooner. If at any point dizziness with other neurologic type signs or symptoms then ED evaluation.

## 2014-04-21 ENCOUNTER — Telehealth: Payer: Self-pay | Admitting: Family Medicine

## 2014-04-21 LAB — MICROALBUMIN, URINE: MICROALB UR: 0.7 mg/dL (ref <2.0)

## 2014-04-21 NOTE — Telephone Encounter (Signed)
emmi emailed °

## 2014-04-22 ENCOUNTER — Encounter: Payer: Self-pay | Admitting: Medical

## 2014-04-22 ENCOUNTER — Ambulatory Visit (INDEPENDENT_AMBULATORY_CARE_PROVIDER_SITE_OTHER): Payer: 59 | Admitting: Medical

## 2014-04-22 VITALS — BP 131/90 | HR 74 | Temp 97.9°F | Ht 67.5 in | Wt 210.8 lb

## 2014-04-22 DIAGNOSIS — R42 Dizziness and giddiness: Secondary | ICD-10-CM | POA: Insufficient documentation

## 2014-04-22 DIAGNOSIS — H8110 Benign paroxysmal vertigo, unspecified ear: Secondary | ICD-10-CM | POA: Insufficient documentation

## 2014-04-22 LAB — CBC WITH DIFFERENTIAL/PLATELET
Basophils Absolute: 0.1 10*3/uL (ref 0.0–0.1)
Basophils Relative: 0.8 % (ref 0.0–3.0)
Eosinophils Absolute: 0.4 10*3/uL (ref 0.0–0.7)
Eosinophils Relative: 5.5 % — ABNORMAL HIGH (ref 0.0–5.0)
HCT: 44.9 % (ref 39.0–52.0)
Hemoglobin: 14.8 g/dL (ref 13.0–17.0)
LYMPHS ABS: 1.9 10*3/uL (ref 0.7–4.0)
LYMPHS PCT: 25.6 % (ref 12.0–46.0)
MCHC: 32.9 g/dL (ref 30.0–36.0)
MCV: 94.9 fl (ref 78.0–100.0)
MONOS PCT: 8.7 % (ref 3.0–12.0)
Monocytes Absolute: 0.6 10*3/uL (ref 0.1–1.0)
NEUTROS ABS: 4.4 10*3/uL (ref 1.4–7.7)
Neutrophils Relative %: 59.4 % (ref 43.0–77.0)
PLATELETS: 232 10*3/uL (ref 150.0–400.0)
RBC: 4.74 Mil/uL (ref 4.22–5.81)
RDW: 13 % (ref 11.5–15.5)
WBC: 7.4 10*3/uL (ref 4.0–10.5)

## 2014-04-22 NOTE — Assessment & Plan Note (Signed)
Mild intermittent with features of vertigo. Cbc today. Check bp when has episodes and bs check may be beneficial. Start meclizine as explained on avs. Severe dizziness or with neuro associated signs and symptoms then ED evaluation. If mild persistent recurrent vertigo with head movements then refer to PT.

## 2014-04-22 NOTE — Progress Notes (Signed)
Pre visit review using our clinic review tool, if applicable. No additional management support is needed unless otherwise documented below in the visit note. 

## 2014-04-22 NOTE — Progress Notes (Signed)
Subjective:    Patient ID: Charles Trujillo, male    DOB: [birth year], 63 y.o.   MRN: [id number]  HPI  Pt in feeling fine now. But states occasional episodes of feeling light headed and off balance at times. But not now. Last time he felt like this was yesterday and lasted a minute. Going on for 2 wks. Sporadic and every other day approximated. No uri or allergy signs or symptoms, Slight occasionally queezy sensation. But no ha or gross motor or sensory/function deficits. No head trauma  His bp is not elevated.  Turning head and rapid movment will cause. Last for a minute at most.  Pt a1-c 6.9. Pt does not check his bp.  No new medications.  Past Medical History  Diagnosis Date  . Depression   . Hypertension   . Elevated LFTs     previously evaluated by Dr Sharlett Iles; h/o "fatty liver"  . DM2 (diabetes mellitus, type 2)   . Inguinal hernia     right  . Arthritis     bilateral knees  . HLD (hyperlipidemia)     History   Social History  . Marital Status: Married    Spouse Name: N/A    Number of Children: 1  . Years of Education: N/A   Occupational History  . 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 Smoking status: Current Some Day Smoker    Types: Cigars  . Smokeless tobacco: Not on file     Comment: stopped smoking cigars 2 weeks before surgery  . Alcohol Use: 3.6 oz/week    6 Cans of beer per week  . Drug Use: No  . Sexual Activity: Yes    Partners: Female   Other Topics Concern  . Not on file   Social History Narrative   Exercise--- not regularly    Past Surgical History  Procedure Laterality Date  . Knee surgery      age 7- right  . Achilles tendon repair  1999  . Squamous cell carcinoma excision      left knee  . Knee arthroscopy  1993    left knee  . Inguinal hernia repair  08/09/2011    Procedure: LAPAROSCOPIC INGUINAL HERNIA;  Surgeon: Joyice Faster. Cornett, MD;  Location: [facility];  Service: General;   Laterality: Right;  . Hernia repair  07/2011    Family History  Problem Relation Age of Onset  . Diabetes Mother   . Hypertension Mother   . Heart disease Mother   . Stroke Father 35  . Parkinsonism Father   . Depression Brother   . Alcohol abuse Brother   . Cancer Maternal Grandmother     bone  . Heart failure Mother     Allergies  Allergen Reactions  . Codeine Phosphate     REACTION: nausea    Current Outpatient Prescriptions on File Prior to Visit  Medication Sig Dispense Refill  . ARIPiprazole (ABILIFY) 5 MG tablet Take 1 tablet (5 mg total) by mouth daily.  30 tablet  5  . atorvastatin (LIPITOR) 20 MG tablet TAKE 1 TABLET BY MOUTH ONCE DAILY  90 tablet  1  . celecoxib (CELEBREX) 200 MG capsule TAKE 1 CAPSULE BY MOUTH ONCE DAILY  90 capsule  1  . diltiazem (CARDIZEM CD) 120 MG 24 hr capsule TAKE 1 CAPSULE BY MOUTH ONCE DAILY  90 capsule  1  . DULoxetine (CYMBALTA) 60 MG capsule TAKE 1 CAPSULE BY MOUTH  ONCE DAILY  90 capsule  1  . glucose blood (ONE TOUCH TEST STRIPS) test strip Check blood sugar twice daily  100 each  11  . ibuprofen (ADVIL,MOTRIN) 200 MG tablet Take 400 mg by mouth every 6 (six) hours as needed. Pain       . lisinopril-hydrochlorothiazide (PRINZIDE,ZESTORETIC) 20-12.5 MG per tablet TAKE 1 TABLET BY MOUTH DAILY.  90 tablet  1  . metFORMIN (GLUCOPHAGE-XR) 500 MG 24 hr tablet TAKE 2 TABLETS BY MOUTH DAILY  180 tablet  1  . ONETOUCH DELICA LANCETS MISC 2 (two) times daily. accu check       . sildenafil (VIAGRA) 100 MG tablet Take 1 tablet (100 mg total) by mouth as needed for erectile dysfunction. Take 100 mg by mouth daily as needed for Erectile Dysfunction.  10 tablet  2   No current facility-administered medications on file prior to visit.    BP 131/90  Pulse 74  Temp(Src) 97.9 F (36.6 C) (Oral)  Ht 5' 7.5" (1.715 m)  Wt 210 lb 12.8 oz (95.618 kg)  BMI 32.51 kg/m2  SpO2 98%      Review of Systems  Constitutional: Negative for fever, chills  and fatigue.  HENT: Negative for congestion, ear discharge, ear pain, facial swelling, nosebleeds, postnasal drip, rhinorrhea, sinus pressure, sneezing and sore throat.   Eyes: Negative for pain.  Respiratory: Negative for cough, choking, chest tightness, shortness of breath and wheezing.   Cardiovascular: Negative for chest pain and palpitations.  Endocrine: Negative for polydipsia, polyphagia and polyuria.  Neurological: Positive for dizziness and light-headedness. Negative for tremors, seizures, syncope, facial asymmetry, speech difficulty, weakness, numbness and headaches.       With some vertigo as described on hop.       Objective:   Physical Exam  General Mental Status- Alert. General Appearance- Not in acute distress.    Heent- negative exam. Particularly ear canals and tm's.  Skin General: Color- Normal Color. Moisture- Normal Moisture.  Neck Carotid Arteries- Normal color. Moisture- Normal Moisture. No carotid bruits. No JVD.  Chest and Lung Exam Auscultation: Breath Sounds:-Normal.Clear even and unlabored.  Cardiovascular Auscultation:Rythm- Regular,. Rate and rhythm. Murmurs & Other Heart Sounds:Auscultation of the heart reveals- No Murmurs.  Abdomen Inspection:-Inspeection Normal. Palpation/Percussion:Note:No mass. Palpation and Percussion of the abdomen reveal- Non Tender, Non Distended + BS, no rebound or guarding.    Neurologic Cranial Nerve exam:- CN III-XII intact(No nystagmus), symmetric smile. Drift Test:- No drift. Romberg Exam:- Negative.  Finger to Nose:- Normal/Intact Strength:- 5/5 equal and symmetric strength both upper and lower extremities.       Assessment & Plan:

## 2014-04-22 NOTE — Patient Instructions (Signed)
For your dizziness/vertigo, I am prescribing meclizine. If your experience the dizziness again then use 1 tab every 8 hrs for 24 hour period. Then after 24 hours use as needed basis every 8 hours if you have dizziness. I want to check cbc today. If your dizziness worsens with neurologic associated signs and symptoms as discussed then ED eval. If your symptoms persist  over next 2 weeks and more prominent with head movements then may refer to PT.  Follow up in 2-3 weeks or as needed. Also check your bp with dizzy episodes.  Benign Positional Vertigo Vertigo means you feel like you or your surroundings are moving when they are not. Benign positional vertigo is the most common form of vertigo. Benign means that the cause of your condition is not serious. Benign positional vertigo is more common in older adults. CAUSES  Benign positional vertigo is the result of an upset in the labyrinth system. This is an area in the middle ear that helps control your balance. This may be caused by a viral infection, head injury, or repetitive motion. However, often no specific cause is found. SYMPTOMS  Symptoms of benign positional vertigo occur when you move your head or eyes in different directions. Some of the symptoms may include:  Loss of balance and falls.  Vomiting.  Blurred vision.  Dizziness.  Nausea.  Involuntary eye movements (nystagmus). DIAGNOSIS  Benign positional vertigo is usually diagnosed by physical exam. If the specific cause of your benign positional vertigo is unknown, your caregiver may perform imaging tests, such as magnetic resonance imaging (MRI) or computed tomography (CT). TREATMENT  Your caregiver may recommend movements or procedures to correct the benign positional vertigo. Medicines such as meclizine, benzodiazepines, and medicines for nausea may be used to treat your symptoms. In rare cases, if your symptoms are caused by certain conditions that affect the inner ear, you may  need surgery. HOME CARE INSTRUCTIONS   Follow your caregiver's instructions.  Move slowly. Do not make sudden body or head movements.  Avoid driving.  Avoid operating heavy machinery.  Avoid performing any tasks that would be dangerous to you or others during a vertigo episode.  Drink enough fluids to keep your urine clear or pale yellow. SEEK IMMEDIATE MEDICAL CARE IF:   You develop problems with walking, weakness, numbness, or using your arms, hands, or legs.  You have difficulty speaking.  You develop severe headaches.  Your nausea or vomiting continues or gets worse.  You develop visual changes.  Your family or friends notice any behavioral changes.  Your condition gets worse.  You have a fever.  You develop a stiff neck or sensitivity to light. MAKE SURE YOU:   Understand these instructions.  Will watch your condition.  Will get help right away if you are not doing well or get worse. Document Released: 04/08/2006 Document Revised: 09/23/2011 Document Reviewed: 03/21/2011 [facility] Patient Information 2015 [place], [place]. This information is not intended to replace advice given to you by your health care provider. Make sure you discuss any questions you have with your health care provider.

## 2014-04-23 MED ORDER — MECLIZINE HCL 12.5 MG PO TABS: 12.5000 mg | ORAL_TABLET | Freq: Three times a day (TID) | ORAL | Status: DC | PRN

## 2014-04-23 NOTE — Telephone Encounter (Signed)
Will try to send his prescription of meclizine to his pharmacy.

## 2014-04-25 ENCOUNTER — Encounter: Payer: Self-pay | Admitting: Family Medicine

## 2014-04-25 ENCOUNTER — Telehealth: Payer: Self-pay | Admitting: Family Medicine

## 2014-04-25 NOTE — Telephone Encounter (Signed)
emmi emailed °

## 2014-05-20 ENCOUNTER — Other Ambulatory Visit: Payer: Self-pay | Admitting: Orthopedic Surgery

## 2014-05-31 ENCOUNTER — Other Ambulatory Visit: Payer: Self-pay | Admitting: Family Medicine

## 2014-06-16 ENCOUNTER — Other Ambulatory Visit: Payer: Self-pay | Admitting: Family Medicine

## 2014-06-22 ENCOUNTER — Other Ambulatory Visit: Payer: Self-pay | Admitting: Family Medicine

## 2014-07-05 ENCOUNTER — Other Ambulatory Visit: Payer: Self-pay | Admitting: Family Medicine

## 2014-07-18 ENCOUNTER — Encounter (HOSPITAL_BASED_OUTPATIENT_CLINIC_OR_DEPARTMENT_OTHER): Payer: Self-pay | Admitting: *Deleted

## 2014-07-19 ENCOUNTER — Other Ambulatory Visit: Payer: Self-pay

## 2014-07-19 ENCOUNTER — Encounter (HOSPITAL_BASED_OUTPATIENT_CLINIC_OR_DEPARTMENT_OTHER)
Admission: RE | Admit: 2014-07-19 | Discharge: 2014-07-19 | Disposition: A | Discharge status: Home / Self Care | Payer: 59 | Source: Ambulatory Visit | Attending: Orthopedic Surgery | Admitting: Orthopedic Surgery

## 2014-07-19 DIAGNOSIS — G5602 Carpal tunnel syndrome, left upper limb: Secondary | ICD-10-CM | POA: Diagnosis present

## 2014-07-19 DIAGNOSIS — F17210 Nicotine dependence, cigarettes, uncomplicated: Secondary | ICD-10-CM | POA: Diagnosis not present

## 2014-07-19 DIAGNOSIS — F329 Major depressive disorder, single episode, unspecified: Secondary | ICD-10-CM | POA: Diagnosis not present

## 2014-07-19 DIAGNOSIS — E119 Type 2 diabetes mellitus without complications: Secondary | ICD-10-CM | POA: Diagnosis not present

## 2014-07-19 DIAGNOSIS — Z823 Family history of stroke: Secondary | ICD-10-CM | POA: Diagnosis not present

## 2014-07-19 DIAGNOSIS — Z888 Allergy status to other drugs, medicaments and biological substances status: Secondary | ICD-10-CM | POA: Diagnosis not present

## 2014-07-19 DIAGNOSIS — E785 Hyperlipidemia, unspecified: Secondary | ICD-10-CM | POA: Diagnosis not present

## 2014-07-19 DIAGNOSIS — Z8249 Family history of ischemic heart disease and other diseases of the circulatory system: Secondary | ICD-10-CM | POA: Diagnosis not present

## 2014-07-19 DIAGNOSIS — Z6832 Body mass index (BMI) 32.0-32.9, adult: Secondary | ICD-10-CM | POA: Diagnosis not present

## 2014-07-19 DIAGNOSIS — M170 Bilateral primary osteoarthritis of knee: Secondary | ICD-10-CM | POA: Diagnosis not present

## 2014-07-19 DIAGNOSIS — I10 Essential (primary) hypertension: Secondary | ICD-10-CM | POA: Diagnosis not present

## 2014-07-19 DIAGNOSIS — K4090 Unilateral inguinal hernia, without obstruction or gangrene, not specified as recurrent: Secondary | ICD-10-CM | POA: Diagnosis not present

## 2014-07-19 DIAGNOSIS — Z794 Long term (current) use of insulin: Secondary | ICD-10-CM | POA: Diagnosis not present

## 2014-07-19 DIAGNOSIS — R000 Tachycardia, unspecified: Secondary | ICD-10-CM | POA: Diagnosis not present

## 2014-07-19 DIAGNOSIS — G5622 Lesion of ulnar nerve, left upper limb: Secondary | ICD-10-CM | POA: Diagnosis not present

## 2014-07-19 LAB — BASIC METABOLIC PANEL
Anion gap: 10 (ref 5–15)
BUN: 14 mg/dL (ref 6–23)
CO2: 23 mmol/L (ref 19–32)
Calcium: 9 mg/dL (ref 8.4–10.5)
Chloride: 104 mEq/L (ref 96–112)
Creatinine, Ser: 1.22 mg/dL (ref 0.50–1.35)
GFR calc Af Amer: 70 mL/min — ABNORMAL LOW (ref >90)
GFR calc non Af Amer: 61 mL/min — ABNORMAL LOW (ref >90)
GLUCOSE: 139 mg/dL — AB (ref 70–99)
POTASSIUM: 4.4 mmol/L (ref 3.5–5.1)
Sodium: 137 mmol/L (ref 135–145)

## 2014-07-22 ENCOUNTER — Encounter (HOSPITAL_BASED_OUTPATIENT_CLINIC_OR_DEPARTMENT_OTHER)
Admission: RE | Disposition: A | Discharge status: Home / Self Care | Payer: Self-pay | Source: Ambulatory Visit | Attending: Orthopedic Surgery

## 2014-07-22 ENCOUNTER — Encounter (HOSPITAL_BASED_OUTPATIENT_CLINIC_OR_DEPARTMENT_OTHER): Payer: Self-pay | Admitting: Anesthesiology

## 2014-07-22 ENCOUNTER — Ambulatory Visit (HOSPITAL_BASED_OUTPATIENT_CLINIC_OR_DEPARTMENT_OTHER): Payer: 59 | Admitting: Anesthesiology

## 2014-07-22 ENCOUNTER — Ambulatory Visit (HOSPITAL_BASED_OUTPATIENT_CLINIC_OR_DEPARTMENT_OTHER)
Admission: RE | Admit: 2014-07-22 | Discharge: 2014-07-22 | Disposition: A | Discharge status: Home / Self Care | Payer: 59 | Source: Ambulatory Visit | Attending: Orthopedic Surgery | Admitting: Orthopedic Surgery

## 2014-07-22 DIAGNOSIS — Z888 Allergy status to other drugs, medicaments and biological substances status: Secondary | ICD-10-CM | POA: Insufficient documentation

## 2014-07-22 DIAGNOSIS — I10 Essential (primary) hypertension: Secondary | ICD-10-CM | POA: Insufficient documentation

## 2014-07-22 DIAGNOSIS — Z6832 Body mass index (BMI) 32.0-32.9, adult: Secondary | ICD-10-CM | POA: Insufficient documentation

## 2014-07-22 DIAGNOSIS — G5622 Lesion of ulnar nerve, left upper limb: Secondary | ICD-10-CM | POA: Insufficient documentation

## 2014-07-22 DIAGNOSIS — Z823 Family history of stroke: Secondary | ICD-10-CM | POA: Insufficient documentation

## 2014-07-22 DIAGNOSIS — R000 Tachycardia, unspecified: Secondary | ICD-10-CM | POA: Insufficient documentation

## 2014-07-22 DIAGNOSIS — K4090 Unilateral inguinal hernia, without obstruction or gangrene, not specified as recurrent: Secondary | ICD-10-CM | POA: Insufficient documentation

## 2014-07-22 DIAGNOSIS — E785 Hyperlipidemia, unspecified: Secondary | ICD-10-CM | POA: Insufficient documentation

## 2014-07-22 DIAGNOSIS — F329 Major depressive disorder, single episode, unspecified: Secondary | ICD-10-CM | POA: Insufficient documentation

## 2014-07-22 DIAGNOSIS — Z8249 Family history of ischemic heart disease and other diseases of the circulatory system: Secondary | ICD-10-CM | POA: Insufficient documentation

## 2014-07-22 DIAGNOSIS — M170 Bilateral primary osteoarthritis of knee: Secondary | ICD-10-CM | POA: Insufficient documentation

## 2014-07-22 DIAGNOSIS — G5602 Carpal tunnel syndrome, left upper limb: Secondary | ICD-10-CM | POA: Insufficient documentation

## 2014-07-22 DIAGNOSIS — Z794 Long term (current) use of insulin: Secondary | ICD-10-CM | POA: Insufficient documentation

## 2014-07-22 DIAGNOSIS — E119 Type 2 diabetes mellitus without complications: Secondary | ICD-10-CM | POA: Insufficient documentation

## 2014-07-22 DIAGNOSIS — F17210 Nicotine dependence, cigarettes, uncomplicated: Secondary | ICD-10-CM | POA: Insufficient documentation

## 2014-07-22 HISTORY — DX: Cardiac arrhythmia, unspecified: I49.9

## 2014-07-22 HISTORY — PX: CARPAL TUNNEL WITH CUBITAL TUNNEL: SHX5608

## 2014-07-22 LAB — GLUCOSE, CAPILLARY
GLUCOSE-CAPILLARY: 135 mg/dL — AB (ref 70–99)
Glucose-Capillary: 121 mg/dL — ABNORMAL HIGH (ref 70–99)

## 2014-07-22 LAB — POCT HEMOGLOBIN-HEMACUE: HEMOGLOBIN: 15 g/dL (ref 13.0–17.0)

## 2014-07-22 SURGERY — RELEASE, CARPAL TUNNEL AND CUBITAL TUNNEL
Anesthesia: General | Laterality: Left

## 2014-07-22 MED ORDER — LACTATED RINGERS IV SOLN
INTRAVENOUS | Status: DC
  Administered 2014-07-22 (×2): via INTRAVENOUS

## 2014-07-22 MED ORDER — CEFAZOLIN SODIUM-DEXTROSE 2-3 GM-% IV SOLR: 2.0000 g | INTRAVENOUS | Status: DC

## 2014-07-22 MED ORDER — FENTANYL CITRATE 0.05 MG/ML IJ SOLN: 50.0000 ug | INTRAMUSCULAR | Status: DC | PRN

## 2014-07-22 MED ORDER — PROPOFOL 10 MG/ML IV BOLUS
INTRAVENOUS | Status: DC | PRN
  Administered 2014-07-22: 200 mg via INTRAVENOUS

## 2014-07-22 MED ORDER — 0.9 % SODIUM CHLORIDE (POUR BTL) OPTIME
TOPICAL | Status: DC | PRN
  Administered 2014-07-22: 100 mL

## 2014-07-22 MED ORDER — EPHEDRINE SULFATE 50 MG/ML IJ SOLN
INTRAMUSCULAR | Status: DC | PRN
  Administered 2014-07-22 (×3): 10 mg via INTRAVENOUS

## 2014-07-22 MED ORDER — BUPIVACAINE HCL (PF) 0.25 % IJ SOLN
INTRAMUSCULAR | Status: DC | PRN
  Administered 2014-07-22: 10 mL

## 2014-07-22 MED ORDER — FENTANYL CITRATE 0.05 MG/ML IJ SOLN
INTRAMUSCULAR | Status: AC
  Filled 2014-07-22: qty 6

## 2014-07-22 MED ORDER — CHLORHEXIDINE GLUCONATE 4 % EX LIQD
60.0000 mL | Freq: Once | CUTANEOUS | Status: DC
Start: 2014-07-22 — End: 2014-07-22

## 2014-07-22 MED ORDER — MIDAZOLAM HCL 2 MG/2ML IJ SOLN
INTRAMUSCULAR | Status: AC
  Filled 2014-07-22: qty 2

## 2014-07-22 MED ORDER — PHENYLEPHRINE HCL 10 MG/ML IJ SOLN
INTRAMUSCULAR | Status: DC | PRN
Start: 2014-07-22 — End: 2014-07-22
  Administered 2014-07-22 (×2): 40 ug via INTRAVENOUS

## 2014-07-22 MED ORDER — MIDAZOLAM HCL 5 MG/5ML IJ SOLN
INTRAMUSCULAR | Status: DC | PRN
  Administered 2014-07-22: 2 mg via INTRAVENOUS

## 2014-07-22 MED ORDER — CEFAZOLIN SODIUM-DEXTROSE 2-3 GM-% IV SOLR
INTRAVENOUS | Status: AC
  Filled 2014-07-22: qty 50

## 2014-07-22 MED ORDER — DEXAMETHASONE SODIUM PHOSPHATE 10 MG/ML IJ SOLN
INTRAMUSCULAR | Status: DC | PRN
  Administered 2014-07-22: 10 mg via INTRAVENOUS

## 2014-07-22 MED ORDER — PROMETHAZINE HCL 25 MG/ML IJ SOLN: 6.2500 mg | INTRAMUSCULAR | Status: DC | PRN

## 2014-07-22 MED ORDER — FENTANYL CITRATE 0.05 MG/ML IJ SOLN
INTRAMUSCULAR | Status: DC | PRN
  Administered 2014-07-22 (×2): 50 ug via INTRAVENOUS

## 2014-07-22 MED ORDER — OXYCODONE HCL 5 MG/5ML PO SOLN: 5.0000 mg | Freq: Once | ORAL | Status: DC | PRN

## 2014-07-22 MED ORDER — MIDAZOLAM HCL 2 MG/2ML IJ SOLN: 1.0000 mg | INTRAMUSCULAR | Status: DC | PRN

## 2014-07-22 MED ORDER — HYDROCODONE-ACETAMINOPHEN 5-325 MG PO TABS: 2.0000 | ORAL_TABLET | Freq: Four times a day (QID) | ORAL | Status: DC | PRN

## 2014-07-22 MED ORDER — HYDROMORPHONE HCL 1 MG/ML IJ SOLN: 0.2500 mg | INTRAMUSCULAR | Status: DC | PRN

## 2014-07-22 MED ORDER — SODIUM CHLORIDE 0.45 % IV SOLN: INTRAVENOUS | Status: DC

## 2014-07-22 MED ORDER — LIDOCAINE HCL (CARDIAC) 20 MG/ML IV SOLN
INTRAVENOUS | Status: DC | PRN
  Administered 2014-07-22: 100 mg via INTRAVENOUS

## 2014-07-22 MED ORDER — OXYCODONE HCL 5 MG PO TABS: 5.0000 mg | ORAL_TABLET | Freq: Once | ORAL | Status: DC | PRN

## 2014-07-22 MED ORDER — CEFAZOLIN SODIUM-DEXTROSE 2-3 GM-% IV SOLR
INTRAVENOUS | Status: DC | PRN
  Administered 2014-07-22: 2 g via INTRAVENOUS

## 2014-07-22 SURGICAL SUPPLY — 71 items
BANDAGE ELASTIC 3 VELCRO ST LF (GAUZE/BANDAGES/DRESSINGS) ×6 IMPLANT
BANDAGE ELASTIC 4 VELCRO ST LF (GAUZE/BANDAGES/DRESSINGS) IMPLANT
BANDAGE ELASTIC 6 VELCRO ST LF (GAUZE/BANDAGES/DRESSINGS) ×3 IMPLANT
BLADE CLIPPER SURG (BLADE) IMPLANT
BLADE SURG 15 STRL LF DISP TIS (BLADE) ×3 IMPLANT
BLADE SURG 15 STRL SS (BLADE) ×9
BNDG CONFORM 3 STRL LF (GAUZE/BANDAGES/DRESSINGS) ×3 IMPLANT
BNDG GAUZE ELAST 4 BULKY (GAUZE/BANDAGES/DRESSINGS) ×3 IMPLANT
BRUSH SCRUB EZ PLAIN DRY (MISCELLANEOUS) ×3 IMPLANT
CANISTER SUCT 1200ML W/VALVE (MISCELLANEOUS) ×3 IMPLANT
CLOSURE WOUND 1/2 X4 (GAUZE/BANDAGES/DRESSINGS)
CORDS BIPOLAR (ELECTRODE) ×3 IMPLANT
COVER BACK TABLE 60X90IN (DRAPES) ×3 IMPLANT
COVER MAYO STAND STRL (DRAPES) ×3 IMPLANT
CUFF TOURNIQUET SINGLE 18IN (TOURNIQUET CUFF) IMPLANT
DECANTER SPIKE VIAL GLASS SM (MISCELLANEOUS) IMPLANT
DRAPE EXTREMITY T 121X128X90 (DRAPE) ×3 IMPLANT
DRAPE INCISE IOBAN 66X45 STRL (DRAPES) IMPLANT
DRAPE SURG 17X23 STRL (DRAPES) ×3 IMPLANT
DRSG ADAPTIC 3X8 NADH LF (GAUZE/BANDAGES/DRESSINGS) ×2 IMPLANT
EVACUATOR 1/8 PVC DRAIN (DRAIN) IMPLANT
EVACUATOR 3/16  PVC DRAIN (DRAIN)
EVACUATOR 3/16 PVC DRAIN (DRAIN) IMPLANT
EVACUATOR SILICONE 100CC (DRAIN) IMPLANT
GAUZE SPONGE 4X4 12PLY STRL (GAUZE/BANDAGES/DRESSINGS) ×3 IMPLANT
GAUZE SPONGE 4X4 16PLY XRAY LF (GAUZE/BANDAGES/DRESSINGS) ×2 IMPLANT
GAUZE XEROFORM 5X9 LF (GAUZE/BANDAGES/DRESSINGS) ×2 IMPLANT
GLOVE BIO SURGEON STRL SZ8 (GLOVE) ×3 IMPLANT
GLOVE BIOGEL M STRL SZ7.5 (GLOVE) ×3 IMPLANT
GLOVE SS BIOGEL STRL SZ 8 (GLOVE) ×2 IMPLANT
GLOVE SUPERSENSE BIOGEL SZ 8 (GLOVE) ×4
GOWN STRL REUS W/ TWL LRG LVL3 (GOWN DISPOSABLE) ×1 IMPLANT
GOWN STRL REUS W/ TWL XL LVL3 (GOWN DISPOSABLE) ×1 IMPLANT
GOWN STRL REUS W/TWL LRG LVL3 (GOWN DISPOSABLE) ×3
GOWN STRL REUS W/TWL XL LVL3 (GOWN DISPOSABLE) ×3
LOOP VESSEL MAXI BLUE (MISCELLANEOUS) IMPLANT
NDL HYPO 25X1 1.5 SAFETY (NEEDLE) ×1 IMPLANT
NEEDLE HYPO 22GX1.5 SAFETY (NEEDLE) ×2 IMPLANT
NEEDLE HYPO 25X1 1.5 SAFETY (NEEDLE) ×3 IMPLANT
NS IRRIG 1000ML POUR BTL (IV SOLUTION) ×3 IMPLANT
PACK BASIN DAY SURGERY FS (CUSTOM PROCEDURE TRAY) ×3 IMPLANT
PAD CAST 3X4 CTTN HI CHSV (CAST SUPPLIES) ×2 IMPLANT
PADDING CAST ABS 4INX4YD NS (CAST SUPPLIES) ×2
PADDING CAST ABS COTTON 4X4 ST (CAST SUPPLIES) ×1 IMPLANT
PADDING CAST COTTON 3X4 STRL (CAST SUPPLIES) ×6
SHEET MEDIUM DRAPE 40X70 STRL (DRAPES) IMPLANT
SPLINT FAST PLASTER 5X30 (CAST SUPPLIES)
SPLINT PLASTER CAST FAST 5X30 (CAST SUPPLIES) IMPLANT
SPLINT PLASTER CAST XFAST 3X15 (CAST SUPPLIES) IMPLANT
SPLINT PLASTER XTRA FASTSET 3X (CAST SUPPLIES)
SPONGE LAP 4X18 X RAY DECT (DISPOSABLE) ×2 IMPLANT
STOCKINETTE 4X48 STRL (DRAPES) ×3 IMPLANT
STOCKINETTE SYNTHETIC 3 UNSTER (CAST SUPPLIES) ×3 IMPLANT
STRIP CLOSURE SKIN 1/2X4 (GAUZE/BANDAGES/DRESSINGS) IMPLANT
SUCTION FRAZIER TIP 10 FR DISP (SUCTIONS) ×3 IMPLANT
SUT BONE WAX W31G (SUTURE) IMPLANT
SUT FIBERWIRE 2-0 18 17.9 3/8 (SUTURE)
SUT PROLENE 4 0 PS 2 18 (SUTURE) ×6 IMPLANT
SUT VIC AB 3-0 FS2 27 (SUTURE) ×2 IMPLANT
SUT VIC AB 4-0 P-3 18XBRD (SUTURE) IMPLANT
SUT VIC AB 4-0 P3 18 (SUTURE)
SUTURE FIBERWR 2-0 18 17.9 3/8 (SUTURE) IMPLANT
SYR BULB 3OZ (MISCELLANEOUS) ×3 IMPLANT
SYR CONTROL 10ML LL (SYRINGE) ×3 IMPLANT
TAPE SURG TRANSPORE 1 IN (GAUZE/BANDAGES/DRESSINGS) ×1 IMPLANT
TAPE SURGICAL TRANSPORE 1 IN (GAUZE/BANDAGES/DRESSINGS) ×2
TOWEL OR 17X24 6PK STRL BLUE (TOWEL DISPOSABLE) ×4 IMPLANT
TOWEL OR NON WOVEN STRL DISP B (DISPOSABLE) ×3 IMPLANT
TUBE CONNECTING 20'X1/4 (TUBING) ×1
TUBE CONNECTING 20X1/4 (TUBING) ×2 IMPLANT
UNDERPAD 30X30 INCONTINENT (UNDERPADS AND DIAPERS) ×3 IMPLANT

## 2014-07-22 NOTE — Anesthesia Postprocedure Evaluation (Signed)
  Anesthesia Post-op Note  Patient: Charles Trujillo  Procedure(s) Performed: Procedure(s): LEFT CARPAL TUNNEL RELEASE AND CUBITAL TUNNEL RELEASE TRANSPOSITION (Left)  Patient Location: PACU  Anesthesia Type:General  Level of Consciousness: awake and alert   Airway and Oxygen Therapy: Patient Spontanous Breathing  Post-op Pain: none  Post-op Assessment: Post-op Vital signs reviewed, Patient's Cardiovascular Status Stable and Respiratory Function Stable  Post-op Vital Signs: Reviewed  Filed Vitals:   07/22/14 1215  BP: 102/78  Pulse: 75  Temp:   Resp: 13    Complications: No apparent anesthesia complications

## 2014-07-22 NOTE — Op Note (Signed)
See [id number] Amedeo Plenty MD

## 2014-07-22 NOTE — Anesthesia Procedure Notes (Signed)
Procedure Name: LMA Insertion Date/Time: 07/22/2014 10:36 AM Performed by: Toula Moos L Pre-anesthesia Checklist: Patient identified, Emergency Drugs available, Suction available, Patient being monitored and Timeout performed Patient Re-evaluated:Patient Re-evaluated prior to inductionOxygen Delivery Method: Circle System Utilized Preoxygenation: Pre-oxygenation with 100% oxygen Intubation Type: IV induction Ventilation: Mask ventilation without difficulty LMA: LMA inserted LMA Size: 5.0 Number of attempts: 1 Airway Equipment and Method: bite block Placement Confirmation: positive ETCO2 Tube secured with: Tape Dental Injury: Teeth and Oropharynx as per pre-operative assessment

## 2014-07-22 NOTE — H&P (Signed)
Charles Trujillo is an 64 y.o. male.   Chief Complaint: patient presents for left carpal tunnel release and left cubital tunnel release secondary to long-standing nerve compression HPI: Patient presents for evaluation and treatment of the of their upper extremity predicament. The patient denies neck back chest or of abdominal pain. The patient notes that they have no lower extremity problems. The patient from primarily complains of the upper extremity pain noted.  Past Medical History  Diagnosis Date  . Depression   . Hypertension   . Elevated LFTs     previously evaluated by Dr Sharlett Iles; h/o "fatty liver"  . DM2 (diabetes mellitus, type 2)   . Inguinal hernia     right  . Arthritis     bilateral knees  . HLD (hyperlipidemia)   . Dysrhythmia      benign tachycardia, takes [place]    Past Surgical History  Procedure Laterality Date  . Knee surgery      age 24- right  . Achilles tendon repair  1999  . Squamous cell carcinoma excision      left knee  . Knee arthroscopy  1993    left knee  . Inguinal hernia repair  08/09/2011    Procedure: LAPAROSCOPIC INGUINAL HERNIA;  Surgeon: Joyice Faster. Cornett, MD;  Location: [facility];  Service: General;  Laterality: Right;  . Hernia repair  07/2011    Family History  Problem Relation Age of Onset  . Diabetes Mother   . Hypertension Mother   . Heart disease Mother   . Stroke Father 57  . Parkinsonism Father   . Depression Brother   . Alcohol abuse Brother   . Cancer Maternal Grandmother     bone  . Heart failure Mother    Social History:  reports that he has been smoking Cigars.  He does not have any smokeless tobacco history on file. He reports that he drinks about 3.6 oz of alcohol per week. He reports that he does not use illicit drugs.  Allergies:  Allergies  Allergen Reactions  . Codeine Nausea Only  . Codeine Phosphate     REACTION: nausea    Medications Prior to Admission  Medication Sig Dispense Refill  . atorvastatin  (LIPITOR) 20 MG tablet TAKE 1 TABLET BY MOUTH ONCE DAILY 90 tablet 1  . CARTIA XT 120 MG 24 hr capsule TAKE 1 CAPSULE BY MOUTH ONCE DAILY 90 capsule 0  . celecoxib (CELEBREX) 200 MG capsule TAKE 1 CAPSULE BY MOUTH ONCE DAILY 90 capsule 1  . DULoxetine (CYMBALTA) 60 MG capsule TAKE 1 CAPSULE BY MOUTH ONCE DAILY 90 capsule 1  . glucose blood (ONE TOUCH TEST STRIPS) test strip Check blood sugar twice daily 100 each 11  . ibuprofen (ADVIL,MOTRIN) 200 MG tablet Take 400 mg by mouth every 6 (six) hours as needed. Pain     . lisinopril-hydrochlorothiazide (PRINZIDE,ZESTORETIC) 20-12.5 MG per tablet TAKE 1 TABLET BY MOUTH DAILY. 90 tablet 0  . metFORMIN (GLUCOPHAGE-XR) 500 MG 24 hr tablet TAKE 2 TABLETS BY MOUTH DAILY 180 tablet 1  . ONETOUCH DELICA LANCETS MISC 2 (two) times daily. accu check     . sildenafil (VIAGRA) 100 MG tablet Take 1 tablet (100 mg total) by mouth as needed for erectile dysfunction. Take 100 mg by mouth daily as needed for Erectile Dysfunction. 10 tablet 2  . meclizine (ANTIVERT) 12.5 MG tablet Take 1 tablet (12.5 mg total) by mouth 3 (three) times daily as needed for dizziness. 30 tablet 0  Results for orders placed or performed during the hospital encounter of 07/22/14 (from the past 48 hour(s))  Hemoglobin-hemacue, POC     Status: None   Collection Time: 07/22/14  8:54 AM  Result Value Ref Range   Hemoglobin 15.0 13.0 - 17.0 g/dL  Glucose, capillary     Status: Abnormal   Collection Time: 07/22/14  9:14 AM  Result Value Ref Range   Glucose-Capillary 135 (H) 70 - 99 mg/dL   No results found.  Review of Systems  Respiratory: Negative.   Genitourinary: Negative.   Psychiatric/Behavioral: Negative.     Blood pressure 141/80, pulse 75, temperature 98 F (36.7 C), temperature source Oral, resp. rate 16, height 5\' 7"  (1.702 m), weight 92.987 kg (205 lb), SpO2 97 %. Physical Exam  The patient is alert and oriented in no acute distress the patient complains of pain in  the affected upper extremity.  The patient is noted to have a normal HEENT exam.  Lung fields show equal chest expansion and no shortness of breath  abdomen exam is nontender without distention.  Lower extremity examination does not show any fracture dislocation or blood clot symptoms.  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 The risk and benefits of surgery include risk of bleeding infection anesthesia damage to normal structures and failure of the surgery to accomplish its intended goals of relieving symptoms and restoring function with this in mind we'll going to proceed. I have specifically discussed with the patient the pre-and postoperative regime and the does and don'ts and risk and benefits in great detail. Risk and benefits of surgery also include risk of dystrophy chronic nerve pain failure of the healing process to go onto completion and other inherent risks of surgery The relavent the pathophysiology of the disease/injury process, as well as the alternatives for treatment and postoperative course of action has been discussed in great detail with the patient who desires to proceed.  We will do everything in our power to help you (the patient) restore function to the upper extremity. Is a pleasure to see this patient today.   Lydian Chavous III,Gurkaran M 07/22/2014, 10:14 AM

## 2014-07-22 NOTE — Transfer of Care (Signed)
Immediate Anesthesia Transfer of Care Note  Patient: Charles Trujillo  Procedure(s) Performed: Procedure(s): LEFT CARPAL TUNNEL RELEASE AND CUBITAL TUNNEL RELEASE TRANSPOSITION (Left)  Patient Location: PACU  Anesthesia Type:General  Level of Consciousness: awake, alert  and patient cooperative  Airway & Oxygen Therapy: Patient Spontanous Breathing and Patient connected to face mask oxygen  Post-op Assessment: Report given to PACU RN and Post -op Vital signs reviewed and stable  Post vital signs: Reviewed and stable  Complications: No apparent anesthesia complications

## 2014-07-22 NOTE — Anesthesia Preprocedure Evaluation (Addendum)
Anesthesia Evaluation  Patient identified by MRN, date of birth, ID band Patient awake    Reviewed: Allergy & Precautions, NPO status , Patient's Chart, lab work & pertinent test results  Airway Mallampati: I  TM Distance: >3 FB Neck ROM: Full    Dental  (+) Teeth Intact   Pulmonary Current Smoker,          Cardiovascular hypertension, Pt. on medications + dysrhythmias     Neuro/Psych Depression negative neurological ROS     GI/Hepatic negative GI ROS, Neg liver ROS,   Endo/Other  diabetes, Type 2, Oral Hypoglycemic AgentsMorbid obesity  Renal/GU negative Renal ROS     Musculoskeletal  (+) Arthritis -,   Abdominal   Peds  Hematology negative hematology ROS (+)   Anesthesia Other Findings   Reproductive/Obstetrics                            Anesthesia Physical Anesthesia Plan  ASA: II  Anesthesia Plan: General   Post-op Pain Management:    Induction: Intravenous  Airway Management Planned: LMA  Additional Equipment:   Intra-op Plan:   Post-operative Plan:   Informed Consent: I have reviewed the patients History and Physical, chart, labs and discussed the procedure including the risks, benefits and alternatives for the proposed anesthesia with the patient or authorized representative who has indicated his/her understanding and acceptance.   Dental advisory given  Plan Discussed with: CRNA  Anesthesia Plan Comments:         Anesthesia Quick Evaluation

## 2014-07-22 NOTE — Discharge Instructions (Signed)
Keep bandage clean and dry.  Call for any problems.  No smoking.  Criteria for driving a car: you should be off your pain medicine for 7-8 hours, able to drive one handed(confident), thinking clearly and feeling able in your judgement to drive. Continue elevation as it will decrease swelling.  If instructed by MD move your fingers within the confines of the bandage/splint.  Use ice if instructed by your MD. Call immediately for any sudden loss of feeling in your hand/arm or change in functional abilities of the extremity.We recommend that you to take vitamin C 1000 mg a day to promote healing we also recommend that if you require her pain medicine that he take a stool softener to prevent constipation as most pain medicines will have constipation side effects. We recommend either Peri-Colace or Senokot and recommend that you also consider adding MiraLAX to prevent the constipation affects from pain medicine if you are required to use them. These medicines are over the counter and maybe purchased at a local pharmacy.   We also rec Vit B6 200 mg a day to promote nerve healing  Discharge Instructions After Orthopedic Procedures:  *You may feel tired and weak following your procedure. It is recommended that you limit physical activity for the next 24 hours and rest at home for the remainder of today and tomorrow. *No strenuous activity should be started without your doctor's permission.  Elevate the extremity that you had surgery on to a level above your heart. This should continue for 48 hours or as instructed by your doctor.  If you had hand, arm or shoulder surgery you should move your fingers frequently unless otherwise instructed by your doctor.  If you had foot, knee or leg surgery you should wiggle your toes frequently unless otherwise instructed by your doctor.  Follow your doctor's exact instructions for activity at home. Use your home equipment as instructed. (Crutches, hard shoes, slings  etc.)  Limit your activity as instructed by your doctor.  Report to your doctor should any of the following occur: 1. Extreme swelling of your fingers or toes. 2. Inability to wiggle your fingers or toes. 3. Coldness, pale or bluish color in your fingers or toes. 4. Loss of sensation, numbness or tingling of your fingers or toes. 5. Unusual smell or odor from under your dressing or cast. 6. Excessive bleeding or drainage from the surgical site. 7. Pain not relieved by medication your doctor has prescribed for you. 8. Cast or dressing too tight (do not get your dressing or cast wet or put anything under          your dressing or cast.)  *Do not change your dressing unless instructed by your doctor or discharge nurse. Then follow exact instructions.  *Follow labeled instructions for any medications that your doctor may have prescribed for you. *Should any questions or complications develop following your procedure, PLEASE CONTACT YOUR DOCTOR.  Post Anesthesia Home Care Instructions  Activity: Get plenty of rest for the remainder of the day. A responsible adult should stay with you for 24 hours following the procedure.  For the next 24 hours, DO NOT: -Drive a car -Paediatric nurse -Drink alcoholic beverages -Take any medication unless instructed by your physician -Make any legal decisions or sign important papers.  Meals: Start with liquid foods such as gelatin or soup. Progress to regular foods as tolerated. Avoid greasy, spicy, heavy foods. If nausea and/or vomiting occur, drink only clear liquids until the nausea and/or vomiting subsides.  Call your physician if vomiting continues.  Special Instructions/Symptoms: Your throat may feel dry or sore from the anesthesia or the breathing tube placed in your throat during surgery. If this causes discomfort, gargle with warm salt water. The discomfort should disappear within 24 hours.

## 2014-07-25 ENCOUNTER — Encounter (HOSPITAL_BASED_OUTPATIENT_CLINIC_OR_DEPARTMENT_OTHER): Payer: Self-pay | Admitting: Orthopedic Surgery

## 2014-07-25 NOTE — Op Note (Signed)
NAME:  THORN, DEMAS NO.:  [id number]  MEDICAL RECORD NO.:  [id number]  LOCATION:                                 FACILITY:  PHYSICIAN:  Satira Anis. Nyaisha Simao, M.D.DATE OF BIRTH:  07-Jun-1951  DATE OF PROCEDURE:  07/22/2014 DATE OF DISCHARGE:  07/22/2014                              OPERATIVE REPORT   PREOPERATIVE DIAGNOSIS: 1. Carpal tunnel syndrome, left upper extremity. 2. Cubital tunnel syndrome, left upper extremity.  POSTOPERATIVE DIAGNOSIS: 1. Carpal tunnel syndrome, left upper extremity. 2. Cubital tunnel syndrome, left upper extremity.  PROCEDURE: 1. Left open carpal tunnel release. 2. Ulnar nerve release at the elbow (cubital tunnel release in situ).  SURGEON:  Satira Anis. Amedeo Plenty, M.D.  ASSISTANT:  None.  COMPLICATIONS:  None.  ANESTHESIA:  General.  TOURNIQUET TIME:  Less than an hour.  INDICATIONS FOR PROCEDURE:  This patient is a very pleasant male, who presents with the above-mentioned diagnosis.  I have counseled him in regard risks and benefits of surgery including risk of infection, bleeding, anesthesia, damage to normal structures, and failure of surgery to accomplish its intended goals of relieving symptoms and restoring function.  With this in mind, he desires to proceed.  All questions have been encouraged and answered preoperatively.  OPERATIVE PROCEDURE:  The patient was seen by myself and Anesthesia, taken to operative suite, underwent smooth induction of general anesthesia, laid supine, fully padded, prepped and draped in usual sterile fashion with Betadine scrub and paint.  Following this, patient underwent a very careful and cautious approach to the arm with sterile prep and drape being secured, time-out being called, and preoperative antibiotics being given.  Curvilinear incision was made about the elbow posterior medially, dissection was carried down.  The arcade of Struthers, medial intermuscular septum, FCU, both the  superficial and deep heads, and Osborne's ligament, as well as a cubital tunnel were all released.  The patient had marked compression over the Osborne's ligament region of the cubital tunnel, and there were no complicating features.  Following complete release with a careful window technique, we then ranged the elbow and the patient was noted to be without tendency towards subluxation of the ulnar nerve.  The patient had nice smooth arc of motion.  No complicating features and all looked quite well.  We had placed these in the findings.  Once this was complete, the patient then underwent a very careful and cautious approach to the area of dissection with the irrigation followed by closure of the wound once the tourniquet was deflated later in the case.  I closed wound with simple interrupted sutures and ensured hemostasis was stable.  This was a cubital tunnel release/ulnar nerve decompression at the elbow and all looked well.  Maximum site of compression over Osborne's ligament.  Following this, an incision was made about the transverse carpal ligament, region of the carpal canal.  Dissection was carried out through 1.5 cm incision in the palm, dissection was carried down and the transverse carpal ligament was released in its entirety under direct vision.  The distal edge was released first, fat pad egressed, he had some stout bands distally which were very impressive, a little bit beyond what we  usually see as the termination of the transverse carpal ligament.  I very carefully protected the nerve and the superficial palmar arch and released this.  Following this and fat pad egression, we then dissected proximally until adequate room is available to slide the scissor tips.  Under direct vision, the remaining portions of the proximal leaflet and end portions of the antebrachial fascia.  The patient tolerated this quite well.  There were no complicating features. Following this, the  patient had irrigation applied, and the wound closed after hemostasis was secured.  The patient tolerated this well, and there were no complicating features.  The patient will be monitored in the recovery room and then discharged to home after a period of observatory care.  These notes have been discussed and all questions have been encouraged and answered.  It was a pleasure to see the patient today.     Satira Anis. Amedeo Plenty, M.D.     [facility]  D:  07/22/2014  T:  07/23/2014  Job:  [id number]

## 2014-10-04 ENCOUNTER — Other Ambulatory Visit: Payer: Self-pay | Admitting: Family Medicine

## 2014-10-04 MED ORDER — LISINOPRIL-HYDROCHLOROTHIAZIDE 20-12.5 MG PO TABS: 1.0000 | ORAL_TABLET | Freq: Every day | ORAL | Status: DC

## 2014-10-04 MED ORDER — DILTIAZEM HCL ER COATED BEADS 120 MG PO CP24: 120.0000 mg | ORAL_CAPSULE | Freq: Every day | ORAL | Status: DC

## 2014-10-06 ENCOUNTER — Other Ambulatory Visit: Payer: Self-pay | Admitting: Orthopedic Surgery

## 2014-10-13 ENCOUNTER — Other Ambulatory Visit: Payer: Self-pay | Admitting: Family Medicine

## 2014-10-14 ENCOUNTER — Encounter: Payer: Self-pay | Admitting: Family Medicine

## 2014-11-01 ENCOUNTER — Encounter: Payer: Self-pay | Admitting: Family Medicine

## 2014-11-01 ENCOUNTER — Ambulatory Visit (INDEPENDENT_AMBULATORY_CARE_PROVIDER_SITE_OTHER): Payer: 59 | Admitting: Family Medicine

## 2014-11-01 VITALS — BP 124/82 | HR 81 | Temp 97.8°F | Wt 210.8 lb

## 2014-11-01 DIAGNOSIS — G5603 Carpal tunnel syndrome, bilateral upper limbs: Secondary | ICD-10-CM

## 2014-11-01 DIAGNOSIS — H8110 Benign paroxysmal vertigo, unspecified ear: Secondary | ICD-10-CM | POA: Diagnosis not present

## 2014-11-01 DIAGNOSIS — G5602 Carpal tunnel syndrome, left upper limb: Secondary | ICD-10-CM

## 2014-11-01 DIAGNOSIS — E785 Hyperlipidemia, unspecified: Secondary | ICD-10-CM | POA: Diagnosis not present

## 2014-11-01 DIAGNOSIS — G5601 Carpal tunnel syndrome, right upper limb: Secondary | ICD-10-CM

## 2014-11-01 DIAGNOSIS — E119 Type 2 diabetes mellitus without complications: Secondary | ICD-10-CM | POA: Diagnosis not present

## 2014-11-01 DIAGNOSIS — I10 Essential (primary) hypertension: Secondary | ICD-10-CM | POA: Diagnosis not present

## 2014-11-01 DIAGNOSIS — Z0000 Encounter for general adult medical examination without abnormal findings: Secondary | ICD-10-CM

## 2014-11-01 DIAGNOSIS — Z23 Encounter for immunization: Secondary | ICD-10-CM | POA: Diagnosis not present

## 2014-11-01 LAB — BASIC METABOLIC PANEL
BUN: 15 mg/dL (ref 6–23)
CO2: 30 mEq/L (ref 19–32)
CREATININE: 1.17 mg/dL (ref 0.40–1.50)
Calcium: 9.7 mg/dL (ref 8.4–10.5)
Chloride: 103 mEq/L (ref 96–112)
GFR: 66.43 mL/min (ref >60.00)
GLUCOSE: 129 mg/dL — AB (ref 70–99)
POTASSIUM: 4.2 meq/L (ref 3.5–5.1)
SODIUM: 138 meq/L (ref 135–145)

## 2014-11-01 LAB — MICROALBUMIN / CREATININE URINE RATIO
Creatinine,U: 173.5 mg/dL
Microalb Creat Ratio: 0.6 mg/g (ref 0.0–30.0)
Microalb, Ur: 1 mg/dL (ref 0.0–1.9)

## 2014-11-01 LAB — POCT URINALYSIS DIPSTICK
Bilirubin, UA: NEGATIVE
Blood, UA: NEGATIVE
Glucose, UA: NEGATIVE
Ketones, UA: NEGATIVE
LEUKOCYTES UA: NEGATIVE
NITRITE UA: NEGATIVE
PROTEIN UA: NEGATIVE
Spec Grav, UA: >=1.03
Urobilinogen, UA: 0.2
pH, UA: 5.5

## 2014-11-01 LAB — LIPID PANEL
CHOL/HDL RATIO: 2
Cholesterol: 140 mg/dL (ref 0–200)
HDL: 64.7 mg/dL (ref >39.00)
LDL Cholesterol: 66 mg/dL (ref 0–99)
NonHDL: 75.3
TRIGLYCERIDES: 49 mg/dL (ref 0.0–149.0)
VLDL: 9.8 mg/dL (ref 0.0–40.0)

## 2014-11-01 LAB — HEPATIC FUNCTION PANEL
ALT: 33 U/L (ref 0–53)
AST: 35 U/L (ref 0–37)
Albumin: 4.4 g/dL (ref 3.5–5.2)
Alkaline Phosphatase: 52 U/L (ref 39–117)
BILIRUBIN TOTAL: 0.4 mg/dL (ref 0.2–1.2)
Bilirubin, Direct: 0.1 mg/dL (ref 0.0–0.3)
TOTAL PROTEIN: 6.9 g/dL (ref 6.0–8.3)

## 2014-11-01 LAB — HEMOGLOBIN A1C: HEMOGLOBIN A1C: 6.6 % — AB (ref 4.6–6.5)

## 2014-11-01 MED ORDER — METFORMIN HCL ER 500 MG PO TB24: 1000.0000 mg | ORAL_TABLET | Freq: Every day | ORAL | Status: DC

## 2014-11-01 MED ORDER — ATORVASTATIN CALCIUM 20 MG PO TABS: 20.0000 mg | ORAL_TABLET | Freq: Every day | ORAL | Status: DC

## 2014-11-01 NOTE — Progress Notes (Signed)
Pre visit review using our clinic review tool, if applicable. No additional management support is needed unless otherwise documented below in the visit note. 

## 2014-11-01 NOTE — Assessment & Plan Note (Signed)
con't meclizine HO on epley manuvers F/u prn

## 2014-11-01 NOTE — Progress Notes (Signed)
Patient ID: Charles Trujillo, male    DOB: June 07, 1951  Age: 64 y.o. MRN: [id number]    Subjective:  Subjective HPI Charles Trujillo presents for f/u dm, htn and hyperlipidemia  Review of Systems  Constitutional: Negative for diaphoresis, appetite change, fatigue and unexpected weight change.  Eyes: Negative for pain, redness and visual disturbance.  Respiratory: Negative for cough, chest tightness, shortness of breath and wheezing.   Cardiovascular: Negative for chest pain, palpitations and leg swelling.  Endocrine: Negative for cold intolerance, heat intolerance, polydipsia, polyphagia and polyuria.  Genitourinary: Negative for dysuria, frequency and difficulty urinating.  Neurological: Negative for dizziness, light-headedness, numbness and headaches.  Psychiatric/Behavioral: Negative for decreased concentration. The patient is not nervous/anxious.     History Past Medical History  Diagnosis Date  . Depression   . Hypertension   . Elevated LFTs     previously evaluated by Dr Sharlett Iles; h/o "fatty liver"  . DM2 (diabetes mellitus, type 2)   . Inguinal hernia     right  . Arthritis     bilateral knees  . HLD (hyperlipidemia)   . Dysrhythmia      benign tachycardia, takes [place]    He has past surgical history that includes Knee surgery; Achilles tendon repair (1999); Squamous cell carcinoma excision; Knee arthroscopy (1993); Inguinal hernia repair (08/09/2011); Hernia repair (07/2011); and Carpal tunnel with cubital tunnel (Left, 07/22/2014).   His family history includes Alcohol abuse in his brother; Cancer in his maternal grandmother; Depression in his brother; Diabetes in his mother; Heart disease in his mother; Heart failure in his mother; Hypertension in his mother; Parkinsonism in his father; Stroke (age of onset: 51) in his father.He reports that he has been smoking Cigars.  He does not have any smokeless tobacco history on file. He reports that he drinks about 3.6 oz of  alcohol per week. He reports that he does not use illicit drugs.  Current Outpatient Prescriptions on File Prior to Visit  Medication Sig Dispense Refill  . celecoxib (CELEBREX) 200 MG capsule TAKE 1 CAPSULE BY MOUTH ONCE DAILY 90 capsule 1  . diltiazem (CARTIA XT) 120 MG 24 hr capsule Take 1 capsule (120 mg total) by mouth daily. 90 capsule 1  . DULoxetine (CYMBALTA) 60 MG capsule TAKE 1 CAPSULE BY MOUTH ONCE DAILY 90 capsule 1  . glucose blood (ONE TOUCH TEST STRIPS) test strip Check blood sugar twice daily 100 each 11  . ibuprofen (ADVIL,MOTRIN) 200 MG tablet Take 400 mg by mouth every 6 (six) hours as needed. Pain     . lisinopril-hydrochlorothiazide (PRINZIDE,ZESTORETIC) 20-12.5 MG per tablet Take 1 tablet by mouth daily. 90 tablet 1  . meclizine (ANTIVERT) 12.5 MG tablet Take 1 tablet (12.5 mg total) by mouth 3 (three) times daily as needed for dizziness. 30 tablet 0  . ONETOUCH DELICA LANCETS MISC 2 (two) times daily. accu check     . sildenafil (VIAGRA) 100 MG tablet Take 1 tablet (100 mg total) by mouth as needed for erectile dysfunction. Take 100 mg by mouth daily as needed for Erectile Dysfunction. 10 tablet 2   No current facility-administered medications on file prior to visit.     Objective:  Objective Physical Exam  Constitutional: He is oriented to person, place, and time. Vital signs are normal. He appears well-developed and well-nourished. He is sleeping.  HENT:  Head: Normocephalic and atraumatic.  Mouth/Throat: Oropharynx is clear and moist.  Eyes: EOM are normal. Pupils are equal, round, and reactive to light.  Neck: Normal range of motion. Neck supple. No thyromegaly present.  Cardiovascular: Normal rate and regular rhythm.   No murmur heard. Pulmonary/Chest: Effort normal and breath sounds normal. No respiratory distress. He has no wheezes. He has no rales. He exhibits no tenderness.  Musculoskeletal: He exhibits no edema or tenderness.  Neurological: He is alert  and oriented to person, place, and time.  Skin: Skin is warm and dry.  Psychiatric: He has a normal mood and affect. His behavior is normal. Judgment and thought content normal.  Sensory exam of the foot is normal, tested with the monofilament. Good pulses, no lesions or ulcers, good peripheral pulses.  BP 124/82 mmHg  Pulse 81  Temp(Src) 97.8 F (36.6 C) (Oral)  Wt 210 lb 12.8 oz (95.618 kg)  SpO2 95% Wt Readings from Last 3 Encounters:  11/01/14 210 lb 12.8 oz (95.618 kg)  07/22/14 205 lb (92.987 kg)  04/22/14 210 lb 12.8 oz (95.618 kg)     Lab Results  Component Value Date   WBC 7.4 04/22/2014   HGB 15.0 07/22/2014   HCT 44.9 04/22/2014   PLT 232.0 04/22/2014   GLUCOSE 139* 07/19/2014   CHOL 152 04/20/2014   TRIG 54.0 04/20/2014   HDL 58.70 04/20/2014   LDLDIRECT 143.5 02/01/2011   LDLCALC 83 04/20/2014   ALT 49 04/20/2014   AST 43* 04/20/2014   NA 137 07/19/2014   K 4.4 07/19/2014   CL 104 07/19/2014   CREATININE 1.22 07/19/2014   BUN 14 07/19/2014   CO2 23 07/19/2014   TSH 2.08 04/03/2012   PSA 0.33 04/03/2012   HGBA1C 6.9* 04/20/2014   MICROALBUR 0.7 04/20/2014    No results found.   Assessment & Plan:  Plan I have discontinued Charles Trujillo's HYDROcodone-acetaminophen. I have also changed his atorvastatin and metFORMIN. Additionally, I am having him maintain his [facility] LANCETS, ibuprofen, sildenafil, glucose blood, meclizine, diltiazem, lisinopril-hydrochlorothiazide, DULoxetine, celecoxib, metoprolol, aspirin EC, and Mirabegron (MYRBETRIQ PO).  Meds ordered this encounter  Medications  . metoprolol (LOPRESSOR) 50 MG tablet    Sig: Take 1 tablet by mouth 2 (two) times daily.  Marland Kitchen aspirin EC 81 MG tablet    Sig: Take 1 tablet by mouth daily.  Marland Kitchen atorvastatin (LIPITOR) 20 MG tablet    Sig: Take 1 tablet (20 mg total) by mouth daily.    Dispense:  90 tablet    Refill:  1    D/C PREVIOUS SCRIPTS FOR THIS MEDICATION  . metFORMIN (GLUCOPHAGE-XR) 500  MG 24 hr tablet    Sig: Take 2 tablets (1,000 mg total) by mouth daily.    Dispense:  180 tablet    Refill:  1    D/C PREVIOUS SCRIPTS FOR THIS MEDICATION  . Mirabegron (MYRBETRIQ PO)    Sig: Take by mouth.    Problem List Items Addressed This Visit    Benign paroxysmal positional vertigo - Primary   Relevant Orders   Microalbumin / creatinine urine ratio   POCT urinalysis dipstick    Other Visit Diagnoses    DM II (diabetes mellitus, type II), controlled        Relevant Medications    aspirin EC 81 MG tablet    atorvastatin (LIPITOR) 20 MG tablet    metFORMIN (GLUCOPHAGE-XR) 500 MG 24 hr tablet    Other Relevant Orders    Basic metabolic panel    Hemoglobin A1c    Microalbumin / creatinine urine ratio    POCT urinalysis dipstick    Essential  hypertension        Relevant Medications    metoprolol (LOPRESSOR) 50 MG tablet    aspirin EC 81 MG tablet    atorvastatin (LIPITOR) 20 MG tablet    Other Relevant Orders    Basic metabolic panel    Microalbumin / creatinine urine ratio    POCT urinalysis dipstick    Hyperlipidemia        Relevant Medications    metoprolol (LOPRESSOR) 50 MG tablet    aspirin EC 81 MG tablet    atorvastatin (LIPITOR) 20 MG tablet    Other Relevant Orders    Hepatic function panel    Lipid panel    Microalbumin / creatinine urine ratio    POCT urinalysis dipstick       Follow-up: Return in about 6 months (around 05/03/2015), or if symptoms worsen or fail to improve, for f/u.  Garnet Koyanagi, DO

## 2014-11-01 NOTE — Patient Instructions (Signed)
Epley Maneuver Self-Care WHAT IS THE EPLEY MANEUVER? The Epley maneuver is an exercise you can do to relieve symptoms of benign paroxysmal positional vertigo (BPPV). This condition is often just referred to as vertigo. BPPV is caused by the movement of tiny crystals (canaliths) inside your inner ear. The accumulation and movement of canaliths in your inner ear causes a sudden spinning sensation (vertigo) when you move your head to certain positions. Vertigo usually lasts about 30 seconds. BPPV usually occurs in just one ear. If you get vertigo when you lie on your left side, you probably have BPPV in your left ear. Your health care provider can tell you which ear is involved.  BPPV may be caused by a head injury. Many people older than 50 get BPPV for unknown reasons. If you have been diagnosed with BPPV, your health care provider may teach you how to do this maneuver. BPPV is not life threatening (benign) and usually goes away in time.  WHEN SHOULD I PERFORM THE EPLEY MANEUVER? You can do this maneuver at home whenever you have symptoms of vertigo. You may do the Epley maneuver up to 3 times a day until your symptoms of vertigo go away. HOW SHOULD I DO THE EPLEY MANEUVER? 1. Sit on the edge of a bed or table with your back straight. Your legs should be extended or hanging over the edge of the bed or table.  2. Turn your head halfway toward the affected ear.  3. Lie backward quickly with your head turned until you are lying flat on your back. You may want to position a pillow under your shoulders.  4. Hold this position for 30 seconds. You may experience an attack of vertigo. This is normal. Hold this position until the vertigo stops. 5. Then turn your head to the opposite direction until your unaffected ear is facing the floor.  6. Hold this position for 30 seconds. You may experience an attack of vertigo. This is normal. Hold this position until the vertigo stops. 7. Now turn your whole body to  the same side as your head. Hold for another 30 seconds.  8. You can then sit back up. ARE THERE RISKS TO THIS MANEUVER? In some cases, you may have other symptoms (such as changes in your vision, weakness, or numbness). If you have these symptoms, stop doing the maneuver and call your health care provider. Even if doing these maneuvers relieves your vertigo, you may still have dizziness. Dizziness is the sensation of light-headedness but without the sensation of movement. Even though the Epley maneuver may relieve your vertigo, it is possible that your symptoms will return within 5 years. WHAT SHOULD I DO AFTER THIS MANEUVER? After doing the Epley maneuver, you can return to your normal activities. Ask your doctor if there is anything you should do at home to prevent vertigo. This may include:  Sleeping with two or more pillows to keep your head elevated.  Not sleeping on the side of your affected ear.  Getting up slowly from bed.  Avoiding sudden movements during the day.  Avoiding extreme head movement, like looking up or bending over.  Wearing a cervical collar to prevent sudden head movements. WHAT SHOULD I DO IF MY SYMPTOMS GET WORSE? Call your health care provider if your vertigo gets worse. Call your provider right way if you have other symptoms, including:   Nausea.  Vomiting.  Headache.  Weakness.  Numbness.  Vision changes. Document Released: 07/06/2013 Document Reviewed: 07/06/2013 ExitCare   Patient Information 2015 [employer]. This information is not intended to replace advice given to you by your health care provider. Make sure you discuss any questions you have with your health care provider.  

## 2014-11-01 NOTE — Assessment & Plan Note (Signed)
Surgery on L Friday

## 2014-11-01 NOTE — Assessment & Plan Note (Signed)
con't metformin Check labs

## 2014-11-02 ENCOUNTER — Encounter (HOSPITAL_BASED_OUTPATIENT_CLINIC_OR_DEPARTMENT_OTHER): Payer: Self-pay | Admitting: *Deleted

## 2014-11-02 ENCOUNTER — Encounter: Payer: Self-pay | Admitting: Family Medicine

## 2014-11-02 LAB — HIV ANTIBODY (ROUTINE TESTING W REFLEX): HIV 1&2 Ab, 4th Generation: NONREACTIVE

## 2014-11-04 ENCOUNTER — Ambulatory Visit (HOSPITAL_BASED_OUTPATIENT_CLINIC_OR_DEPARTMENT_OTHER): Payer: 59 | Admitting: Anesthesiology

## 2014-11-04 ENCOUNTER — Encounter (HOSPITAL_BASED_OUTPATIENT_CLINIC_OR_DEPARTMENT_OTHER): Payer: Self-pay | Admitting: *Deleted

## 2014-11-04 ENCOUNTER — Ambulatory Visit (HOSPITAL_BASED_OUTPATIENT_CLINIC_OR_DEPARTMENT_OTHER)
Admission: RE | Admit: 2014-11-04 | Discharge: 2014-11-04 | Disposition: A | Discharge status: Home / Self Care | Payer: 59 | Source: Ambulatory Visit | Attending: Orthopedic Surgery | Admitting: Orthopedic Surgery

## 2014-11-04 ENCOUNTER — Encounter (HOSPITAL_BASED_OUTPATIENT_CLINIC_OR_DEPARTMENT_OTHER)
Admission: RE | Disposition: A | Discharge status: Home / Self Care | Payer: Self-pay | Source: Ambulatory Visit | Attending: Orthopedic Surgery

## 2014-11-04 DIAGNOSIS — E119 Type 2 diabetes mellitus without complications: Secondary | ICD-10-CM | POA: Diagnosis not present

## 2014-11-04 DIAGNOSIS — Z79899 Other long term (current) drug therapy: Secondary | ICD-10-CM | POA: Diagnosis not present

## 2014-11-04 DIAGNOSIS — Z885 Allergy status to narcotic agent status: Secondary | ICD-10-CM | POA: Insufficient documentation

## 2014-11-04 DIAGNOSIS — E785 Hyperlipidemia, unspecified: Secondary | ICD-10-CM | POA: Insufficient documentation

## 2014-11-04 DIAGNOSIS — I10 Essential (primary) hypertension: Secondary | ICD-10-CM | POA: Insufficient documentation

## 2014-11-04 DIAGNOSIS — F17290 Nicotine dependence, other tobacco product, uncomplicated: Secondary | ICD-10-CM | POA: Insufficient documentation

## 2014-11-04 DIAGNOSIS — G5601 Carpal tunnel syndrome, right upper limb: Secondary | ICD-10-CM | POA: Insufficient documentation

## 2014-11-04 DIAGNOSIS — F419 Anxiety disorder, unspecified: Secondary | ICD-10-CM | POA: Diagnosis not present

## 2014-11-04 DIAGNOSIS — F329 Major depressive disorder, single episode, unspecified: Secondary | ICD-10-CM | POA: Diagnosis not present

## 2014-11-04 HISTORY — DX: Anxiety disorder, unspecified: F41.9

## 2014-11-04 HISTORY — PX: CARPAL TUNNEL RELEASE: SHX101

## 2014-11-04 LAB — GLUCOSE, CAPILLARY
GLUCOSE-CAPILLARY: 118 mg/dL — AB (ref 70–99)
Glucose-Capillary: 143 mg/dL — ABNORMAL HIGH (ref 70–99)

## 2014-11-04 LAB — POCT HEMOGLOBIN-HEMACUE: HEMOGLOBIN: 13.5 g/dL (ref 13.0–17.0)

## 2014-11-04 SURGERY — CARPAL TUNNEL RELEASE
Anesthesia: Monitor Anesthesia Care | Site: Wrist | Laterality: Right

## 2014-11-04 MED ORDER — MIDAZOLAM HCL 2 MG/2ML IJ SOLN
INTRAMUSCULAR | Status: AC
  Filled 2014-11-04: qty 2

## 2014-11-04 MED ORDER — MIDAZOLAM HCL 5 MG/5ML IJ SOLN
INTRAMUSCULAR | Status: DC | PRN
  Administered 2014-11-04: 2 mg via INTRAVENOUS

## 2014-11-04 MED ORDER — HYDROMORPHONE HCL 2 MG PO TABS: 4.0000 mg | ORAL_TABLET | ORAL | Status: DC | PRN

## 2014-11-04 MED ORDER — OXYCODONE HCL 5 MG PO TABS: 5.0000 mg | ORAL_TABLET | Freq: Once | ORAL | Status: DC | PRN

## 2014-11-04 MED ORDER — OXYCODONE HCL 5 MG/5ML PO SOLN: 5.0000 mg | Freq: Once | ORAL | Status: DC | PRN

## 2014-11-04 MED ORDER — ONDANSETRON HCL 4 MG/2ML IJ SOLN
INTRAMUSCULAR | Status: DC | PRN
  Administered 2014-11-04: 4 mg via INTRAVENOUS

## 2014-11-04 MED ORDER — FENTANYL CITRATE (PF) 100 MCG/2ML IJ SOLN: 25.0000 ug | INTRAMUSCULAR | Status: DC | PRN

## 2014-11-04 MED ORDER — MIDAZOLAM HCL 2 MG/2ML IJ SOLN: 1.0000 mg | INTRAMUSCULAR | Status: DC | PRN

## 2014-11-04 MED ORDER — PROPOFOL 500 MG/50ML IV EMUL
INTRAVENOUS | Status: AC
  Filled 2014-11-04: qty 50

## 2014-11-04 MED ORDER — PROPOFOL 10 MG/ML IV BOLUS
INTRAVENOUS | Status: AC
  Filled 2014-11-04: qty 120

## 2014-11-04 MED ORDER — FENTANYL CITRATE (PF) 100 MCG/2ML IJ SOLN
INTRAMUSCULAR | Status: DC | PRN
  Administered 2014-11-04 (×2): 50 ug via INTRAVENOUS

## 2014-11-04 MED ORDER — FENTANYL CITRATE (PF) 100 MCG/2ML IJ SOLN
INTRAMUSCULAR | Status: AC
  Filled 2014-11-04: qty 6

## 2014-11-04 MED ORDER — LIDOCAINE HCL (PF) 1 % IJ SOLN
INTRAMUSCULAR | Status: DC | PRN
  Administered 2014-11-04: 10 mL

## 2014-11-04 MED ORDER — CEFAZOLIN SODIUM-DEXTROSE 2-3 GM-% IV SOLR
INTRAVENOUS | Status: DC | PRN
  Administered 2014-11-04: 2 g via INTRAVENOUS

## 2014-11-04 MED ORDER — CHLORHEXIDINE GLUCONATE 4 % EX LIQD: 60.0000 mL | Freq: Once | CUTANEOUS | Status: DC

## 2014-11-04 MED ORDER — BUPIVACAINE HCL (PF) 0.25 % IJ SOLN
INTRAMUSCULAR | Status: DC | PRN
  Administered 2014-11-04: 10 mL

## 2014-11-04 MED ORDER — ONDANSETRON HCL 4 MG/2ML IJ SOLN: 4.0000 mg | Freq: Four times a day (QID) | INTRAMUSCULAR | Status: DC | PRN

## 2014-11-04 MED ORDER — LACTATED RINGERS IV SOLN
INTRAVENOUS | Status: DC
Start: 2014-11-04 — End: 2014-11-04
  Administered 2014-11-04: 07:00:00 via INTRAVENOUS

## 2014-11-04 MED ORDER — SODIUM BICARBONATE 4 % IV SOLN
INTRAVENOUS | Status: DC | PRN
  Administered 2014-11-04: 1 mL via INTRAVENOUS

## 2014-11-04 MED ORDER — CEFAZOLIN SODIUM-DEXTROSE 2-3 GM-% IV SOLR: 2.0000 g | INTRAVENOUS | Status: DC

## 2014-11-04 SURGICAL SUPPLY — 45 items
BANDAGE ELASTIC 3 VELCRO ST LF (GAUZE/BANDAGES/DRESSINGS) ×3 IMPLANT
BLADE CARPAL TUNNEL SNGL USE (BLADE) ×3 IMPLANT
BLADE SURG 15 STRL LF DISP TIS (BLADE) ×2 IMPLANT
BLADE SURG 15 STRL SS (BLADE) ×6
BNDG CONFORM 3 STRL LF (GAUZE/BANDAGES/DRESSINGS) ×3 IMPLANT
BRUSH SCRUB EZ PLAIN DRY (MISCELLANEOUS) ×3 IMPLANT
CORDS BIPOLAR (ELECTRODE) ×3 IMPLANT
COVER BACK TABLE 60X90IN (DRAPES) ×3 IMPLANT
COVER MAYO STAND STRL (DRAPES) ×3 IMPLANT
CUFF TOURNIQUET SINGLE 18IN (TOURNIQUET CUFF) ×2 IMPLANT
DRAIN PENROSE 1/4X12 LTX STRL (WOUND CARE) IMPLANT
DRAPE EXTREMITY T 121X128X90 (DRAPE) ×3 IMPLANT
DRAPE SURG 17X23 STRL (DRAPES) ×3 IMPLANT
DRSG EMULSION OIL 3X3 NADH (GAUZE/BANDAGES/DRESSINGS) ×3 IMPLANT
GAUZE SPONGE 4X4 12PLY STRL (GAUZE/BANDAGES/DRESSINGS) IMPLANT
GAUZE SPONGE 4X4 16PLY XRAY LF (GAUZE/BANDAGES/DRESSINGS) ×2 IMPLANT
GAUZE XEROFORM 1X8 LF (GAUZE/BANDAGES/DRESSINGS) ×3 IMPLANT
GLOVE BIOGEL M STRL SZ7.5 (GLOVE) ×3 IMPLANT
GLOVE SS BIOGEL STRL SZ 8 (GLOVE) ×1 IMPLANT
GLOVE SUPERSENSE BIOGEL SZ 8 (GLOVE) ×2
GOWN STRL REUS W/ TWL LRG LVL3 (GOWN DISPOSABLE) ×1 IMPLANT
GOWN STRL REUS W/ TWL XL LVL3 (GOWN DISPOSABLE) ×1 IMPLANT
GOWN STRL REUS W/TWL LRG LVL3 (GOWN DISPOSABLE) ×3
GOWN STRL REUS W/TWL XL LVL3 (GOWN DISPOSABLE) ×3
LOOP VESSEL MAXI BLUE (MISCELLANEOUS) IMPLANT
NDL HYPO 25X1 1.5 SAFETY (NEEDLE) ×2 IMPLANT
NDL SAFETY ECLIPSE 18X1.5 (NEEDLE) ×1 IMPLANT
NEEDLE HYPO 18GX1.5 SHARP (NEEDLE) ×3
NEEDLE HYPO 22GX1.5 SAFETY (NEEDLE) IMPLANT
NEEDLE HYPO 25X1 1.5 SAFETY (NEEDLE) ×6 IMPLANT
NS IRRIG 1000ML POUR BTL (IV SOLUTION) ×3 IMPLANT
PACK BASIN DAY SURGERY FS (CUSTOM PROCEDURE TRAY) ×3 IMPLANT
PAD ALCOHOL SWAB (MISCELLANEOUS) ×24 IMPLANT
PAD CAST 3X4 CTTN HI CHSV (CAST SUPPLIES) ×2 IMPLANT
PADDING CAST ABS 4INX4YD NS (CAST SUPPLIES) ×2
PADDING CAST ABS COTTON 4X4 ST (CAST SUPPLIES) ×1 IMPLANT
PADDING CAST COTTON 3X4 STRL (CAST SUPPLIES) ×6
STOCKINETTE 4X48 STRL (DRAPES) ×3 IMPLANT
SUT PROLENE 4 0 PS 2 18 (SUTURE) ×3 IMPLANT
SYR BULB 3OZ (MISCELLANEOUS) ×3 IMPLANT
SYR CONTROL 10ML LL (SYRINGE) ×6 IMPLANT
TOWEL OR 17X24 6PK STRL BLUE (TOWEL DISPOSABLE) ×3 IMPLANT
TOWEL OR NON WOVEN STRL DISP B (DISPOSABLE) ×3 IMPLANT
TRAY DSU PREP LF (CUSTOM PROCEDURE TRAY) ×3 IMPLANT
UNDERPAD 30X30 INCONTINENT (UNDERPADS AND DIAPERS) ×3 IMPLANT

## 2014-11-04 NOTE — Anesthesia Postprocedure Evaluation (Signed)
Anesthesia Post Note  Patient: Charles Trujillo  Procedure(s) Performed: Procedure(s) (LRB): LIMITED OPEN RIGHT CARPAL TUNNEL RELEASE (Left)  Anesthesia type: MAC  Patient location: PACU  Post pain: Pain level controlled and Adequate analgesia  Post assessment: Post-op Vital signs reviewed, Patient's Cardiovascular Status Stable and Respiratory Function Stable  Last Vitals:  Filed Vitals:   11/04/14 0859  BP: 116/68  Pulse: 70  Temp: 36.4 C  Resp: 16    Post vital signs: Reviewed and stable  Level of consciousness: awake, alert  and oriented  Complications: No apparent anesthesia complications

## 2014-11-04 NOTE — Anesthesia Procedure Notes (Signed)
Procedure Name: MAC Date/Time: 11/04/2014 7:41 AM Performed by: Marrianne Mood Pre-anesthesia Checklist: Patient identified, Timeout performed, Emergency Drugs available, Suction available and Patient being monitored Patient Re-evaluated:Patient Re-evaluated prior to inductionOxygen Delivery Method: Simple face mask

## 2014-11-04 NOTE — Anesthesia Preprocedure Evaluation (Signed)
Anesthesia Evaluation  Patient identified by MRN, date of birth, ID band Patient awake    Reviewed: Allergy & Precautions, NPO status , Patient's Chart, lab work & pertinent test results  Airway Mallampati: II   Neck ROM: full    Dental   Pulmonary Current Smoker,  breath sounds clear to auscultation        Cardiovascular hypertension, Rhythm:regular Rate:Normal     Neuro/Psych Anxiety Depression    GI/Hepatic   Endo/Other  diabetes, Type 2obese  Renal/GU      Musculoskeletal  (+) Arthritis -,   Abdominal   Peds  Hematology   Anesthesia Other Findings   Reproductive/Obstetrics                             Anesthesia Physical Anesthesia Plan  ASA: II  Anesthesia Plan: MAC   Post-op Pain Management:    Induction: Intravenous  Airway Management Planned: Simple Face Mask  Additional Equipment:   Intra-op Plan:   Post-operative Plan:   Informed Consent: I have reviewed the patients History and Physical, chart, labs and discussed the procedure including the risks, benefits and alternatives for the proposed anesthesia with the patient or authorized representative who has indicated his/her understanding and acceptance.     Plan Discussed with: CRNA, Anesthesiologist and Surgeon  Anesthesia Plan Comments:         Anesthesia Quick Evaluation

## 2014-11-04 NOTE — Discharge Instructions (Signed)
We recommend that you to take vitamin C 1000 mg a day to promote healing. We also recommend that if you require  pain medicine that you take a stool softener to prevent constipation as most pain medicines will have constipation side effects. We recommend either Peri-Colace or Senokot and recommend that you also consider adding MiraLAX to prevent the constipation affects from pain medicine if you are required to use them. These medicines are over the counter and maybe purchased at a local pharmacy. A cup of yogurt and a probiotic can also be helpful during the recovery process as the medicines can disrupt your intestinal environment. Keep bandage clean and dry.  Call for any problems.  No smoking.  Criteria for driving a car: you should be off your pain medicine for 7-8 hours, able to drive one handed(confident), thinking clearly and feeling able in your judgement to drive. Continue elevation as it will decrease swelling.  If instructed by MD move your fingers within the confines of the bandage/splint.  Use ice if instructed by your MD. Call immediately for any sudden loss of feeling in your hand/arm or change in functional abilities of the extremity.  We also rec Vit B6 200 mg a day to promote nerve healing  Discharge Instructions After Orthopedic Procedures:  *You may feel tired and weak following your procedure. It is recommended that you limit physical activity for the next 24 hours and rest at home for the remainder of today and tomorrow. *No strenuous activity should be started without your doctor's permission.  Elevate the extremity that you had surgery on to a level above your heart. This should continue for 48 hours or as instructed by your doctor.  If you had hand, arm or shoulder surgery you should move your fingers frequently unless otherwise instructed by your doctor.  If you had foot, knee or leg surgery you should wiggle your toes frequently unless otherwise instructed by your  doctor.  Follow your doctor's exact instructions for activity at home. Use your home equipment as instructed. (Crutches, hard shoes, slings etc.)  Limit your activity as instructed by your doctor.  Report to your doctor should any of the following occur: 1. Extreme swelling of your fingers or toes. 2. Inability to wiggle your fingers or toes. 3. Coldness, pale or bluish color in your fingers or toes. 4. Loss of sensation, numbness or tingling of your fingers or toes. 5. Unusual smell or odor from under your dressing or cast. 6. Excessive bleeding or drainage from the surgical site. 7. Pain not relieved by medication your doctor has prescribed for you. 8. Cast or dressing too tight (do not get your dressing or cast wet or put anything under          your dressing or cast.)  *Do not change your dressing unless instructed by your doctor or discharge nurse. Then follow exact instructions.  *Follow labeled instructions for any medications that your doctor may have prescribed for you. *Should any questions or complications develop following your procedure, PLEASE CONTACT YOUR DOCTOR.    Post Anesthesia Home Care Instructions  Activity: Get plenty of rest for the remainder of the day. A responsible adult should stay with you for 24 hours following the procedure.  For the next 24 hours, DO NOT: -Drive a car -Paediatric nurse -Drink alcoholic beverages -Take any medication unless instructed by your physician -Make any legal decisions or sign important papers.  Meals: Start with liquid foods such as gelatin or soup. Progress  to regular foods as tolerated. Avoid greasy, spicy, heavy foods. If nausea and/or vomiting occur, drink only clear liquids until the nausea and/or vomiting subsides. Call your physician if vomiting continues.  Special Instructions/Symptoms: Your throat may feel dry or sore from the anesthesia or the breathing tube placed in your throat during surgery. If this causes  discomfort, gargle with warm salt water. The discomfort should disappear within 24 hours.  If you had a scopolamine patch placed behind your ear for the management of post- operative nausea and/or vomiting:  1. The medication in the patch is effective for 72 hours, after which it should be removed.  Wrap patch in a tissue and discard in the trash. Wash hands thoroughly with soap and water. 2. You may remove the patch earlier than 72 hours if you experience unpleasant side effects which may include dry mouth, dizziness or visual disturbances. 3. Avoid touching the patch. Wash your hands with soap and water after contact with the patch.

## 2014-11-04 NOTE — H&P (Signed)
Charles Trujillo is an 64 y.o. male.   Chief Complaint: R CTS HPI: Patient presents for evaluation and treatment of the of their upper extremity predicament. The patient denies neck, back, chest or  abdominal pain. The patient notes that they have no lower extremity problems. The patients primary complaint is noted. We are planning surgical care pathway for the upper extremity.  Past Medical History  Diagnosis Date  . Depression   . Hypertension   . Elevated LFTs     previously evaluated by Dr Charles Trujillo; h/o "fatty liver"  . DM2 (diabetes mellitus, type 2)   . Inguinal hernia     right  . Arthritis     bilateral knees  . HLD (hyperlipidemia)   . Dysrhythmia      benign tachycardia, takes [place]  . Anxiety     Past Surgical History  Procedure Laterality Date  . Knee surgery      age 78- right  . Achilles tendon repair  1999  . Squamous cell carcinoma excision      left knee  . Knee arthroscopy  1993    left knee  . Inguinal hernia repair  08/09/2011    Procedure: LAPAROSCOPIC INGUINAL HERNIA;  Surgeon: Charles Faster. Cornett, MD;  Location: [facility];  Service: General;  Laterality: Right;  . Hernia repair  07/2011  . Carpal tunnel with cubital tunnel Left 07/22/2014    Procedure: LEFT CARPAL TUNNEL RELEASE AND CUBITAL TUNNEL RELEASE TRANSPOSITION;  Surgeon: Charles Kaufman, MD;  Location: [facility];  Service: Orthopedics;  Laterality: Left;    Family History  Problem Relation Age of Onset  . Diabetes Mother   . Hypertension Mother   . Heart disease Mother   . Stroke Father 46  . Parkinsonism Father   . Depression Brother   . Alcohol abuse Brother   . Cancer Maternal Grandmother     bone  . Heart failure Mother    Social History:  reports that he has been smoking Cigars.  He does not have any smokeless tobacco history on file. He reports that he drinks about 3.6 oz of alcohol per week. He reports that he does not use illicit drugs.  Allergies:  Allergies   Allergen Reactions  . Codeine Nausea Only  . Codeine Phosphate     REACTION: nausea    Medications Prior to Admission  Medication Sig Dispense Refill  . atorvastatin (LIPITOR) 20 MG tablet Take 1 tablet (20 mg total) by mouth daily. 90 tablet 1  . celecoxib (CELEBREX) 200 MG capsule TAKE 1 CAPSULE BY MOUTH ONCE DAILY 90 capsule 1  . diltiazem (CARTIA XT) 120 MG 24 hr capsule Take 1 capsule (120 mg total) by mouth daily. 90 capsule 1  . DULoxetine (CYMBALTA) 60 MG capsule TAKE 1 CAPSULE BY MOUTH ONCE DAILY 90 capsule 1  . glucose blood (ONE TOUCH TEST STRIPS) test strip Check blood sugar twice daily 100 each 11  . ibuprofen (ADVIL,MOTRIN) 200 MG tablet Take 400 mg by mouth every 6 (six) hours as needed. Pain     . lisinopril-hydrochlorothiazide (PRINZIDE,ZESTORETIC) 20-12.5 MG per tablet Take 1 tablet by mouth daily. 90 tablet 1  . meclizine (ANTIVERT) 12.5 MG tablet Take 1 tablet (12.5 mg total) by mouth 3 (three) times daily as needed for dizziness. 30 tablet 0  . metFORMIN (GLUCOPHAGE-XR) 500 MG 24 hr tablet Take 2 tablets (1,000 mg total) by mouth daily. 180 tablet 1  . metoprolol (LOPRESSOR) 50 MG tablet Take  1 tablet by mouth 2 (two) times daily.    . Mirabegron (MYRBETRIQ PO) Take by mouth.    Charles Trujillo DELICA LANCETS MISC 2 (two) times daily. accu check     . sildenafil (VIAGRA) 100 MG tablet Take 1 tablet (100 mg total) by mouth as needed for erectile dysfunction. Take 100 mg by mouth daily as needed for Erectile Dysfunction. 10 tablet 2    Results for orders placed or performed during the hospital encounter of 11/04/14 (from the past 48 hour(s))  Glucose, capillary     Status: Abnormal   Collection Time: 11/04/14  7:08 AM  Result Value Ref Range   Glucose-Capillary 118 (H) 70 - 99 mg/dL   No results found.  Review of Systems  Respiratory: Negative.   Genitourinary: Negative.   Neurological: Negative.   Psychiatric/Behavioral: Negative.     Blood pressure 128/77,  pulse 72, temperature 97.8 F (36.6 C), temperature source Oral, resp. rate 18, height 5\' 7"  (1.702 m), weight 95.255 kg (210 lb), SpO2 96 %. Physical Exam R CTS with positive phalens and tinels The patient is alert and oriented in no acute distress. The patient complains of pain in the affected upper extremity.  The patient is noted to have a normal HEENT exam. Lung fields show equal chest expansion and no shortness of breath. Abdomen exam is nontender without distention. Lower extremity examination does not show any fracture dislocation or blood clot symptoms. Pelvis is stable and the neck and back are stable and nontender.  Assessment/Plan Plan R CTR  We are planning surgery for your upper extremity. The risk and benefits of surgery to include risk of bleeding, infection, anesthesia,  damage to normal structures and failure of the surgery to accomplish its intended goals of relieving symptoms and restoring function have been discussed in detail. With this in mind we plan to proceed. I have specifically discussed with the patient the pre-and postoperative regime and the dos and don'ts and risk and benefits in great detail. Risk and benefits of surgery also include risk of dystrophy(CRPS), chronic nerve pain, failure of the healing process to go onto completion and other inherent risks of surgery The relavent the pathophysiology of the disease/injury process, as well as the alternatives for treatment and postoperative course of action has been discussed in great detail with the patient who desires to proceed.  We will do everything in our power to help you (the patient) restore function to the upper extremity. It is a pleasure to see this patient today.   Charles Trujillo 11/04/2014, 7:37 AM

## 2014-11-04 NOTE — Transfer of Care (Signed)
Immediate Anesthesia Transfer of Care Note  Patient: Charles Trujillo  Procedure(s) Performed: Procedure(s): LIMITED OPEN RIGHT CARPAL TUNNEL RELEASE (Left)  Patient Location: PACU  Anesthesia Type:MAC  Level of Consciousness: awake, alert , oriented and patient cooperative  Airway & Oxygen Therapy: Patient Spontanous Breathing  Post-op Assessment: Report given to RN and Post -op Vital signs reviewed and stable  Post vital signs: Reviewed and stable  Last Vitals:  Filed Vitals:   11/04/14 0827  BP:   Pulse: 73  Temp:   Resp: 11    Complications: No apparent anesthesia complications

## 2014-11-04 NOTE — Op Note (Signed)
See Dictation#[id number] Amedeo Plenty MD

## 2014-11-07 ENCOUNTER — Encounter (HOSPITAL_BASED_OUTPATIENT_CLINIC_OR_DEPARTMENT_OTHER): Payer: Self-pay | Admitting: Orthopedic Surgery

## 2014-11-07 NOTE — Op Note (Signed)
NAME:  Charles Trujillo, TINDEL NO.:  [id number]  MEDICAL RECORD NO.:  [id number]  LOCATION:                                 FACILITY:  PHYSICIAN:  Satira Anis. Anikka Marsan, M.D.DATE OF BIRTH:  [birth year]  DATE OF PROCEDURE:  11/04/2014 DATE OF DISCHARGE:  11/04/2014                              OPERATIVE REPORT   PREOPERATIVE DIAGNOSIS:  Right carpal tunnel syndrome.  POSTOPERATIVE DIAGNOSIS:  Right carpal tunnel syndrome.  PROCEDURE: 1. Right median nerve/peripheral nerve block with wrist forearm level     for anesthetic purposes for carpal tunnel release. 2. Right limited open carpal tunnel release.  SURGEON:  Satira Anis. Amedeo Plenty, M.D.  ASSISTANT:  None.  COMPLICATIONS:  None.  ANESTHESIA:  Peripheral nerve block with IV sedation keeping the patient awake, alert, and oriented at the entire case.  TOURNIQUET TIME:  Less than 10 minutes.  INDICATIONS:  A 64 year old male status post left carpal and cubital tunnel release, doing well.  He presents for right limited open carpal tunnel release understanding and accepting the risks and benefits of surgery.  OPERATION IN DETAIL:  The patient was seen by myself and Anesthesia, taken to the operative arena and underwent smooth induction of peripheral nerve/median nerve block.  He was prepped and draped in a sterile fashion and following this, arm was elevated, tourniquet was insufflated to 250 mmHg and incision was made this as transcarpal ligament.  Dissection was carried down.  Palmar fascia incised __________ transcarpal ligament identified and released under 4.0 loupe magnification, and fat pad thinned egressed nicely.  I made sure he was completely released distally with fat pad egression and careful protection of the superficial palmar arch.  Following this, distal to proximal dissection was carried out until adequate room was available for canal, prepared toward __________ 1, 2, and 3 which were placed just under  the proximal leading leaflet of the transverse carpal ligament. Following this, with the successful passages of canal prepared toward device 1, 2, and 3, the security clip was placed __________ disengaged and security knife was placed and security clip correct releasing proximally for the transverse carpal ligament.  The patient tolerated this well.  He was awake, alert, and oriented in the entire case.  Canal inspection was carried out and revealed excellent decompression. I was pleased this with finding.  She was irrigated, hemostasis secured by electrocautery __________ bleeding points in the wound was then closed with interrupted Prolene sutures x3 followed by sterile compressive dressing. Taken to recovery room in stable condition.  He will see [place] in a week, therapy in 12 days.  Notify should any problems occur, uneventful carpal tunnel release ended beautifully.     Satira Anis. Amedeo Plenty, M.D.     [facility]  D:  11/04/2014  T:  11/04/2014  Job:  [id number]

## 2014-11-08 NOTE — Telephone Encounter (Signed)
Originals given to pt's wife, Levander Campion per pt request. Copy sent for scanning. JG//CMA

## 2014-12-13 ENCOUNTER — Other Ambulatory Visit: Payer: Self-pay | Admitting: Family Medicine

## 2014-12-30 LAB — HM DIABETES EYE EXAM

## 2015-01-27 ENCOUNTER — Encounter: Payer: Self-pay | Admitting: Family Medicine

## 2015-03-27 ENCOUNTER — Other Ambulatory Visit: Payer: Self-pay | Admitting: Family Medicine

## 2015-03-27 ENCOUNTER — Ambulatory Visit (INDEPENDENT_AMBULATORY_CARE_PROVIDER_SITE_OTHER): Payer: 59 | Admitting: Family Medicine

## 2015-03-27 ENCOUNTER — Encounter: Payer: Self-pay | Admitting: Family Medicine

## 2015-03-27 VITALS — BP 128/74 | HR 68 | Temp 98.0°F | Resp 16 | Ht 67.0 in | Wt 208.0 lb

## 2015-03-27 DIAGNOSIS — R300 Dysuria: Secondary | ICD-10-CM | POA: Diagnosis not present

## 2015-03-27 DIAGNOSIS — R748 Abnormal levels of other serum enzymes: Secondary | ICD-10-CM

## 2015-03-27 DIAGNOSIS — R946 Abnormal results of thyroid function studies: Secondary | ICD-10-CM

## 2015-03-27 DIAGNOSIS — R5383 Other fatigue: Secondary | ICD-10-CM | POA: Diagnosis not present

## 2015-03-27 DIAGNOSIS — N289 Disorder of kidney and ureter, unspecified: Secondary | ICD-10-CM

## 2015-03-27 LAB — COMPREHENSIVE METABOLIC PANEL
ALT: 65 U/L — ABNORMAL HIGH (ref 0–53)
AST: 47 U/L — ABNORMAL HIGH (ref 0–37)
Albumin: 4.4 g/dL (ref 3.5–5.2)
Alkaline Phosphatase: 44 U/L (ref 39–117)
BILIRUBIN TOTAL: 0.5 mg/dL (ref 0.2–1.2)
BUN: 29 mg/dL — ABNORMAL HIGH (ref 6–23)
CO2: 29 meq/L (ref 19–32)
CREATININE: 2.12 mg/dL — AB (ref 0.40–1.50)
Calcium: 10 mg/dL (ref 8.4–10.5)
Chloride: 100 mEq/L (ref 96–112)
GFR: 33.41 mL/min — ABNORMAL LOW (ref >60.00)
GLUCOSE: 132 mg/dL — AB (ref 70–99)
Potassium: 4.1 mEq/L (ref 3.5–5.1)
Sodium: 139 mEq/L (ref 135–145)
Total Protein: 7.2 g/dL (ref 6.0–8.3)

## 2015-03-27 LAB — TSH: TSH: 4.98 u[IU]/mL — ABNORMAL HIGH (ref 0.35–4.50)

## 2015-03-27 LAB — POCT URINALYSIS DIPSTICK
BILIRUBIN UA: NEGATIVE
GLUCOSE UA: NEGATIVE
KETONES UA: NEGATIVE
Leukocytes, UA: NEGATIVE
Nitrite, UA: NEGATIVE
Protein, UA: NEGATIVE
RBC UA: NEGATIVE
Spec Grav, UA: >=1.03
UROBILINOGEN UA: 0.2
pH, UA: 7

## 2015-03-27 LAB — VITAMIN B12: Vitamin B-12: 364 pg/mL (ref 211–911)

## 2015-03-27 NOTE — Assessment & Plan Note (Signed)
Check labs Etiology?

## 2015-03-27 NOTE — Progress Notes (Signed)
Pre visit review using our clinic review tool, if applicable. No additional management support is needed unless otherwise documented below in the visit note. 

## 2015-03-27 NOTE — Progress Notes (Signed)
Patient ID: Charles Trujillo, male    DOB: 03-26-1951  Age: 64 y.o. MRN: [id number]    Subjective:  Subjective HPI Charles Trujillo presents for possible uti.  He c/o dysuria and fatigue.  His cardiologist gave him cipro.  No other complaints.   Review of Systems  Constitutional: Negative for diaphoresis, appetite change, fatigue and unexpected weight change.  Eyes: Negative for pain, redness and visual disturbance.  Respiratory: Negative for cough, chest tightness, shortness of breath and wheezing.   Cardiovascular: Negative for chest pain, palpitations and leg swelling.  Endocrine: Negative for cold intolerance, heat intolerance, polydipsia, polyphagia and polyuria.  Genitourinary: Negative for dysuria, frequency and difficulty urinating.  Neurological: Negative for dizziness, light-headedness, numbness and headaches.  Psychiatric/Behavioral: Negative for decreased concentration. The patient is not nervous/anxious.     History Past Medical History  Diagnosis Date  . Depression   . Hypertension   . Elevated LFTs     previously evaluated by Dr Sharlett Iles; h/o "fatty liver"  . DM2 (diabetes mellitus, type 2)   . Inguinal hernia     right  . Arthritis     bilateral knees  . HLD (hyperlipidemia)   . Dysrhythmia      benign tachycardia, takes [place]  . Anxiety     He has past surgical history that includes Knee surgery; Achilles tendon repair (1999); Squamous cell carcinoma excision; Knee arthroscopy (1993); Inguinal hernia repair (08/09/2011); Hernia repair (07/2011); Carpal tunnel with cubital tunnel (Left, 07/22/2014); and Carpal tunnel release (Right, 11/04/2014).   His family history includes Alcohol abuse in his brother; Cancer in his maternal grandmother; Depression in his brother; Diabetes in his mother; Heart disease in his mother; Heart failure in his mother; Hypertension in his mother; Parkinsonism in his father; Stroke (age of onset: 3) in his father.He reports that he  has been smoking Cigars.  He does not have any smokeless tobacco history on file. He reports that he drinks about 3.6 oz of alcohol per week. He reports that he does not use illicit drugs.  Current Outpatient Prescriptions on File Prior to Visit  Medication Sig Dispense Refill  . atorvastatin (LIPITOR) 20 MG tablet TAKE 1 TABLET BY MOUTH ONCE DAILY 90 tablet 1  . celecoxib (CELEBREX) 200 MG capsule TAKE 1 CAPSULE BY MOUTH ONCE DAILY 90 capsule 1  . diltiazem (CARTIA XT) 120 MG 24 hr capsule Take 1 capsule (120 mg total) by mouth daily. 90 capsule 1  . DULoxetine (CYMBALTA) 60 MG capsule TAKE 1 CAPSULE BY MOUTH ONCE DAILY 90 capsule 1  . glucose blood (ONE TOUCH TEST STRIPS) test strip Check blood sugar twice daily 100 each 11  . HYDROmorphone (DILAUDID) 2 MG tablet Take 2 tablets (4 mg total) by mouth every 4 (four) hours as needed for moderate pain or severe pain. 45 tablet 0  . ibuprofen (ADVIL,MOTRIN) 200 MG tablet Take 400 mg by mouth every 6 (six) hours as needed. Pain     . lisinopril-hydrochlorothiazide (PRINZIDE,ZESTORETIC) 20-12.5 MG per tablet Take 1 tablet by mouth daily. 90 tablet 1  . meclizine (ANTIVERT) 12.5 MG tablet Take 1 tablet (12.5 mg total) by mouth 3 (three) times daily as needed for dizziness. 30 tablet 0  . metFORMIN (GLUCOPHAGE-XR) 500 MG 24 hr tablet Take 2 tablets (1,000 mg total) by mouth daily. 180 tablet 1  . metoprolol (LOPRESSOR) 50 MG tablet Take 1 tablet by mouth 2 (two) times daily.    . Mirabegron (MYRBETRIQ PO) Take by mouth.    Marland Kitchen  ONETOUCH DELICA LANCETS MISC 2 (two) times daily. accu check     . sildenafil (VIAGRA) 100 MG tablet Take 1 tablet (100 mg total) by mouth as needed for erectile dysfunction. Take 100 mg by mouth daily as needed for Erectile Dysfunction. 10 tablet 2   No current facility-administered medications on file prior to visit.     Objective:  Objective Physical Exam  Constitutional: He is oriented to person, place, and time. Vital  signs are normal. He appears well-developed and well-nourished. He is sleeping.  HENT:  Head: Normocephalic and atraumatic.  Mouth/Throat: Oropharynx is clear and moist.  Eyes: EOM are normal. Pupils are equal, round, and reactive to light.  Neck: Normal range of motion. Neck supple. No thyromegaly present.  Cardiovascular: Normal rate and regular rhythm.   No murmur heard. Pulmonary/Chest: Effort normal and breath sounds normal. No respiratory distress. He has no wheezes. He has no rales. He exhibits no tenderness.  Musculoskeletal: He exhibits no edema or tenderness.  Neurological: He is alert and oriented to person, place, and time.  Skin: Skin is warm and dry.  Psychiatric: He has a normal mood and affect. His behavior is normal. Judgment and thought content normal.   BP 128/74 mmHg  Pulse 68  Temp(Src) 98 F (36.7 C) (Oral)  Resp 16  Ht _0  (1.702 m)  Wt 208 lb (94.348 kg)  BMI 32.57 kg/m2  SpO2 98% Wt Readings from Last 3 Encounters:  03/27/15 208 lb (94.348 kg)  11/04/14 210 lb (95.255 kg)  11/01/14 210 lb 12.8 oz (95.618 kg)     Lab Results  Component Value Date   WBC 7.4 04/22/2014   HGB 13.5 11/04/2014   HCT 44.9 04/22/2014   PLT 232.0 04/22/2014   GLUCOSE 132* 03/27/2015   CHOL 140 11/01/2014   TRIG 49.0 11/01/2014   HDL 64.70 11/01/2014   LDLDIRECT 143.5 02/01/2011   LDLCALC 66 11/01/2014   ALT 65* 03/27/2015   AST 47* 03/27/2015   NA 139 03/27/2015   K 4.1 03/27/2015   CL 100 03/27/2015   CREATININE 2.12* 03/27/2015   BUN 29* 03/27/2015   CO2 29 03/27/2015   TSH 4.98* 03/27/2015   PSA 0.33 04/03/2012   HGBA1C 6.6* 11/01/2014   MICROALBUR 1.0 11/01/2014    No results found.   Assessment & Plan:  Plan I am having Mr. Rands maintain his [facility] LANCETS, ibuprofen, sildenafil, glucose blood, meclizine, diltiazem, lisinopril-hydrochlorothiazide, DULoxetine, celecoxib, metoprolol, metFORMIN, Mirabegron (MYRBETRIQ PO), HYDROmorphone, and  atorvastatin.  No orders of the defined types were placed in this encounter.    Problem List Items Addressed This Visit    None    Visit Diagnoses    Dysuria    -  Primary    Relevant Orders    POCT urinalysis dipstick (Completed)    Other fatigue        Relevant Orders    Comp Met (CMET) (Completed)    Vitamin B12 (Completed)    TSH (Completed)       Follow-up: Return if symptoms worsen or fail to improve, for annual exam, fasting.  Garnet Koyanagi, DO

## 2015-03-27 NOTE — Patient Instructions (Signed)

## 2015-03-27 NOTE — Assessment & Plan Note (Signed)
ua today neg Finish the cipro  Check labs

## 2015-03-28 ENCOUNTER — Encounter: Payer: Self-pay | Admitting: Family Medicine

## 2015-03-28 ENCOUNTER — Other Ambulatory Visit (INDEPENDENT_AMBULATORY_CARE_PROVIDER_SITE_OTHER): Payer: 59

## 2015-03-28 DIAGNOSIS — N289 Disorder of kidney and ureter, unspecified: Secondary | ICD-10-CM

## 2015-03-28 LAB — THYROID PANEL WITH TSH
FREE THYROXINE INDEX: 2 (ref 1.4–3.8)
T3 UPTAKE: 29 % (ref 22–35)
T4, Total: 6.9 ug/dL (ref 4.5–12.0)
TSH: 2.95 u[IU]/mL (ref 0.350–4.500)

## 2015-03-28 LAB — COMPREHENSIVE METABOLIC PANEL
ALBUMIN: 4.3 g/dL (ref 3.5–5.2)
ALT: 59 U/L — ABNORMAL HIGH (ref 0–53)
AST: 41 U/L — AB (ref 0–37)
Alkaline Phosphatase: 50 U/L (ref 39–117)
BILIRUBIN TOTAL: 0.6 mg/dL (ref 0.2–1.2)
BUN: 32 mg/dL — ABNORMAL HIGH (ref 6–23)
CO2: 29 mEq/L (ref 19–32)
CREATININE: 1.72 mg/dL — AB (ref 0.40–1.50)
Calcium: 9.4 mg/dL (ref 8.4–10.5)
Chloride: 101 mEq/L (ref 96–112)
GFR: 42.53 mL/min — AB (ref >60.00)
Glucose, Bld: 151 mg/dL — ABNORMAL HIGH (ref 70–99)
Potassium: 4 mEq/L (ref 3.5–5.1)
Sodium: 138 mEq/L (ref 135–145)
Total Protein: 7 g/dL (ref 6.0–8.3)

## 2015-03-29 ENCOUNTER — Other Ambulatory Visit: Payer: Self-pay

## 2015-03-29 ENCOUNTER — Encounter: Payer: Self-pay | Admitting: Family Medicine

## 2015-03-29 ENCOUNTER — Other Ambulatory Visit: Payer: 59

## 2015-03-29 DIAGNOSIS — R748 Abnormal levels of other serum enzymes: Secondary | ICD-10-CM

## 2015-03-29 LAB — EPSTEIN-BARR VIRUS VCA ANTIBODY PANEL
EBV EA IgG: 7.6 U/mL (ref <9.0)
EBV NA IgG: 451 U/mL — ABNORMAL HIGH (ref <18.0)
EBV VCA IgG: 87.4 U/mL — ABNORMAL HIGH (ref <18.0)
EBV VCA IgM: <10 U/mL (ref <36.0)

## 2015-03-30 ENCOUNTER — Other Ambulatory Visit: Payer: Self-pay

## 2015-03-30 ENCOUNTER — Ambulatory Visit (HOSPITAL_BASED_OUTPATIENT_CLINIC_OR_DEPARTMENT_OTHER)
Admission: RE | Admit: 2015-03-30 | Discharge: 2015-03-30 | Disposition: A | Discharge status: Home / Self Care | Payer: 59 | Source: Ambulatory Visit | Attending: Family Medicine | Admitting: Family Medicine

## 2015-03-30 DIAGNOSIS — I10 Essential (primary) hypertension: Secondary | ICD-10-CM | POA: Insufficient documentation

## 2015-03-30 DIAGNOSIS — E119 Type 2 diabetes mellitus without complications: Secondary | ICD-10-CM | POA: Diagnosis not present

## 2015-03-30 DIAGNOSIS — R7989 Other specified abnormal findings of blood chemistry: Secondary | ICD-10-CM | POA: Insufficient documentation

## 2015-03-30 DIAGNOSIS — K760 Fatty (change of) liver, not elsewhere classified: Secondary | ICD-10-CM | POA: Insufficient documentation

## 2015-03-30 DIAGNOSIS — R899 Unspecified abnormal finding in specimens from other organs, systems and tissues: Secondary | ICD-10-CM

## 2015-04-10 ENCOUNTER — Other Ambulatory Visit: Payer: Self-pay | Admitting: Family Medicine

## 2015-04-17 ENCOUNTER — Other Ambulatory Visit: Payer: Self-pay | Admitting: Family Medicine

## 2015-04-25 ENCOUNTER — Other Ambulatory Visit: Payer: Self-pay | Admitting: Family Medicine

## 2015-06-01 ENCOUNTER — Other Ambulatory Visit: Payer: Self-pay | Admitting: Family Medicine

## 2015-06-26 ENCOUNTER — Other Ambulatory Visit: Payer: Self-pay | Admitting: Family Medicine

## 2015-07-03 ENCOUNTER — Encounter: Payer: Self-pay | Admitting: Family Medicine

## 2015-07-24 MED FILL — DULoxetine HCL 60 MG CPEP: 60 | 90 days supply | Qty: 90 | Fill #1 | Status: TO

## 2015-09-07 ENCOUNTER — Encounter: Payer: Self-pay | Admitting: Family Medicine

## 2015-09-07 DIAGNOSIS — E785 Hyperlipidemia, unspecified: Secondary | ICD-10-CM

## 2015-09-07 DIAGNOSIS — I10 Essential (primary) hypertension: Secondary | ICD-10-CM

## 2015-09-07 DIAGNOSIS — E119 Type 2 diabetes mellitus without complications: Secondary | ICD-10-CM

## 2015-09-22 ENCOUNTER — Other Ambulatory Visit (INDEPENDENT_AMBULATORY_CARE_PROVIDER_SITE_OTHER): Payer: PRIVATE HEALTH INSURANCE

## 2015-09-22 DIAGNOSIS — E119 Type 2 diabetes mellitus without complications: Secondary | ICD-10-CM

## 2015-09-22 DIAGNOSIS — E785 Hyperlipidemia, unspecified: Secondary | ICD-10-CM

## 2015-09-22 DIAGNOSIS — I10 Essential (primary) hypertension: Secondary | ICD-10-CM | POA: Diagnosis not present

## 2015-09-22 LAB — COMPREHENSIVE METABOLIC PANEL
ALK PHOS: 53 U/L (ref 39–117)
ALT: 36 U/L (ref 0–53)
AST: 30 U/L (ref 0–37)
Albumin: 4.3 g/dL (ref 3.5–5.2)
BUN: 16 mg/dL (ref 6–23)
CALCIUM: 9.2 mg/dL (ref 8.4–10.5)
CHLORIDE: 104 meq/L (ref 96–112)
CO2: 29 mEq/L (ref 19–32)
Creatinine, Ser: 1.16 mg/dL (ref 0.40–1.50)
GFR: 66.9 mL/min (ref >60.00)
GLUCOSE: 152 mg/dL — AB (ref 70–99)
Potassium: 4.3 mEq/L (ref 3.5–5.1)
SODIUM: 140 meq/L (ref 135–145)
Total Bilirubin: 0.5 mg/dL (ref 0.2–1.2)
Total Protein: 6.8 g/dL (ref 6.0–8.3)

## 2015-09-22 LAB — LIPID PANEL
Cholesterol: 141 mg/dL (ref 0–200)
HDL: 64.8 mg/dL (ref >39.00)
LDL CALC: 66 mg/dL (ref 0–99)
NonHDL: 76.67
Total CHOL/HDL Ratio: 2
Triglycerides: 52 mg/dL (ref 0.0–149.0)
VLDL: 10.4 mg/dL (ref 0.0–40.0)

## 2015-09-22 LAB — HEMOGLOBIN A1C: HEMOGLOBIN A1C: 6.6 % — AB (ref 4.6–6.5)

## 2015-09-27 ENCOUNTER — Telehealth: Payer: Self-pay | Admitting: Family Medicine

## 2015-09-27 NOTE — Telephone Encounter (Signed)
Caller name: Eustaquio Maize from [facility]  Call back number: [phone]    Reason for call:  Requesting a refill metFORMIN (GLUCOPHAGE-XR) 500 MG 24 hr tablet

## 2015-09-28 ENCOUNTER — Encounter: Payer: Self-pay | Admitting: Family Medicine

## 2015-09-28 MED ORDER — METFORMIN HCL ER 500 MG PO TB24: 1000.0000 mg | ORAL_TABLET | Freq: Every day | ORAL | Status: DC

## 2015-09-28 NOTE — Telephone Encounter (Signed)
Rx faxed.    KP 

## 2015-10-02 ENCOUNTER — Encounter: Payer: Self-pay | Admitting: Family Medicine

## 2015-10-02 MED ORDER — LISINOPRIL-HYDROCHLOROTHIAZIDE 20-12.5 MG PO TABS: 1.0000 | ORAL_TABLET | Freq: Every day | ORAL | Status: DC

## 2015-10-03 ENCOUNTER — Other Ambulatory Visit: Payer: Self-pay | Admitting: Family Medicine

## 2015-10-03 DIAGNOSIS — R300 Dysuria: Secondary | ICD-10-CM

## 2015-10-26 ENCOUNTER — Encounter: Payer: Self-pay | Admitting: Family Medicine

## 2015-10-26 ENCOUNTER — Other Ambulatory Visit (INDEPENDENT_AMBULATORY_CARE_PROVIDER_SITE_OTHER): Payer: PRIVATE HEALTH INSURANCE

## 2015-10-26 DIAGNOSIS — R300 Dysuria: Secondary | ICD-10-CM | POA: Diagnosis not present

## 2015-10-26 LAB — URINALYSIS, ROUTINE W REFLEX MICROSCOPIC
BILIRUBIN URINE: NEGATIVE
Hgb urine dipstick: NEGATIVE
KETONES UR: NEGATIVE
LEUKOCYTES UA: NEGATIVE
NITRITE: NEGATIVE
Specific Gravity, Urine: 1.025 (ref 1.000–1.030)
TOTAL PROTEIN, URINE-UPE24: NEGATIVE
Urine Glucose: NEGATIVE
Urobilinogen, UA: 0.2 (ref 0.0–1.0)
pH: 5.5 (ref 5.0–8.0)

## 2015-10-28 LAB — URINE CULTURE
COLONY COUNT: NO GROWTH
Organism ID, Bacteria: NO GROWTH

## 2015-11-06 ENCOUNTER — Ambulatory Visit (HOSPITAL_BASED_OUTPATIENT_CLINIC_OR_DEPARTMENT_OTHER)
Admission: RE | Admit: 2015-11-06 | Discharge: 2015-11-06 | Disposition: A | Discharge status: Home / Self Care | Payer: PRIVATE HEALTH INSURANCE | Source: Ambulatory Visit | Attending: Physician Assistant | Admitting: Physician Assistant

## 2015-11-06 ENCOUNTER — Encounter: Payer: Self-pay | Admitting: Physician Assistant

## 2015-11-06 ENCOUNTER — Ambulatory Visit (INDEPENDENT_AMBULATORY_CARE_PROVIDER_SITE_OTHER): Payer: PRIVATE HEALTH INSURANCE | Admitting: Physician Assistant

## 2015-11-06 VITALS — BP 120/68 | HR 68 | Temp 97.9°F | Ht 67.0 in | Wt 213.3 lb

## 2015-11-06 DIAGNOSIS — R1030 Lower abdominal pain, unspecified: Secondary | ICD-10-CM

## 2015-11-06 DIAGNOSIS — R1032 Left lower quadrant pain: Secondary | ICD-10-CM

## 2015-11-06 NOTE — Progress Notes (Signed)
Patient presents to clinic today c/o pain in left inguinal region x 3 weeks. Pain first began after episode of running. Endorses pain is sharp and does not radiate, 4/10 on pain scale. Denies change to bowel or bladder habits. Endorses left inguinal hernia surgery 2-3 years ago.   Past Medical History  Diagnosis Date  . Depression   . Hypertension   . Elevated LFTs     previously evaluated by Dr Sharlett Iles; h/o "fatty liver"  . DM2 (diabetes mellitus, type 2) ([place])   . Inguinal hernia     right  . Arthritis     bilateral knees  . HLD (hyperlipidemia)   . Dysrhythmia      benign tachycardia, takes [place]  . Anxiety     Current Outpatient Prescriptions on File Prior to Visit  Medication Sig Dispense Refill  . atorvastatin (LIPITOR) 20 MG tablet TAKE 1 TABLET BY MOUTH ONCE DAILY 90 tablet 1  . celecoxib (CELEBREX) 200 MG capsule TAKE 1 CAPSULE BY MOUTH ONCE DAILY 90 capsule 1  . diltiazem (CARDIZEM CD) 120 MG 24 hr capsule TAKE 1 CAPSULE BY MOUTH ONCE DAILY 90 capsule 1  . DULoxetine (CYMBALTA) 60 MG capsule TAKE 1 CAPSULE BY MOUTH ONCE DAILY 90 capsule 3  . glucose blood (ONE TOUCH TEST STRIPS) test strip Check blood sugar twice daily 100 each 11  . HYDROmorphone (DILAUDID) 2 MG tablet Take 2 tablets (4 mg total) by mouth every 4 (four) hours as needed for moderate pain or severe pain. 45 tablet 0  . ibuprofen (ADVIL,MOTRIN) 200 MG tablet Take 400 mg by mouth every 6 (six) hours as needed. Pain     . lisinopril-hydrochlorothiazide (PRINZIDE,ZESTORETIC) 20-12.5 MG tablet Take 1 tablet by mouth daily. 90 tablet 1  . meclizine (ANTIVERT) 12.5 MG tablet Take 1 tablet (12.5 mg total) by mouth 3 (three) times daily as needed for dizziness. 30 tablet 0  . metFORMIN (GLUCOPHAGE-XR) 500 MG 24 hr tablet Take 2 tablets (1,000 mg total) by mouth daily with breakfast. 180 tablet 1  . metoprolol (LOPRESSOR) 50 MG tablet Take 1 tablet by mouth 2 (two) times daily.    . Mirabegron (MYRBETRIQ PO)  Take by mouth.    [name] DELICA LANCETS MISC 2 (two) times daily. accu check     . sildenafil (VIAGRA) 100 MG tablet Take 1 tablet (100 mg total) by mouth as needed for erectile dysfunction. Take 100 mg by mouth daily as needed for Erectile Dysfunction. 10 tablet 2   No current facility-administered medications on file prior to visit.    Allergies  Allergen Reactions  . Codeine Nausea Only  . Codeine Phosphate     REACTION: nausea    Family History  Problem Relation Age of Onset  . Diabetes Mother   . Hypertension Mother   . Heart disease Mother   . Stroke Father 36  . Parkinsonism Father   . Depression Brother   . Alcohol abuse Brother   . Cancer Maternal Grandmother     bone  . Heart failure Mother     Social History   Social History  . Marital Status: Married    Spouse Name: N/A  . Number of Children: 1  . Years of Education: N/A   Occupational History  . 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 Smoking status: Current Some Day Smoker    Types: Cigars  . Smokeless tobacco: None     Comment: stopped  smoking cigars 2 weeks before surgery  . Alcohol Use: 3.6 oz/week    6 Cans of beer per week     Comment: [place];  . Drug Use: No  . Sexual Activity:    Partners: Female   Other Topics Concern  . None   Social History Narrative   Exercise--- not regularly   Review of Systems - See HPI.  All other ROS are negative.  BP 120/68 mmHg  Pulse 68  Temp(Src) 97.9 F (36.6 C) (Oral)  Ht _0  (1.702 m)  Wt 213 lb 4.8 oz (96.752 kg)  BMI 33.40 kg/m2  SpO2 98%  Physical Exam  Constitutional: He is oriented to person, place, and time and well-developed, well-nourished, and in no distress.  HENT:  Head: Normocephalic and atraumatic.  Cardiovascular: Normal rate, regular rhythm, normal heart sounds and intact distal pulses.   Pulmonary/Chest: Effort normal and breath sounds normal. No respiratory distress. He has no wheezes.  He has no rales. He exhibits no tenderness.  Abdominal: Soft. Bowel sounds are normal. He exhibits no distension. There is no tenderness. There is no rebound and no guarding. Hernia confirmed negative in the ventral area, confirmed negative in the right inguinal area and confirmed negative in the left inguinal area.  Genitourinary: Testes/scrotum normal and penis normal.  Neurological: He is alert and oriented to person, place, and time.  Skin: Skin is warm and dry. No rash noted.  Vitals reviewed.   Recent Results (from the past 2160 hour(s))  Lipid panel     Status: None   Collection Time: 09/22/15  7:34 AM  Result Value Ref Range   Cholesterol 141 0 - 200 mg/dL    Comment: ATP III Classification       Desirable:  < 200 mg/dL               Borderline High:  200 - 239 mg/dL          High:  > = 240 mg/dL   Triglycerides 52.0 0.0 - 149.0 mg/dL    Comment: Normal:  <150 mg/dLBorderline High:  150 - 199 mg/dL   HDL 64.80 >39.00 mg/dL   VLDL 10.4 0.0 - 40.0 mg/dL   LDL Cholesterol 66 0 - 99 mg/dL   Total CHOL/HDL Ratio 2     Comment:                Men          Women1/2 Average Risk     3.4          3.3Average Risk          5.0          4.42X Average Risk          9.6          7.13X Average Risk          15.0          11.0                       NonHDL 76.67     Comment: NOTE:  Non-HDL goal should be 30 mg/dL higher than patient's LDL goal (i.e. LDL goal of < 70 mg/dL, would have non-HDL goal of < 100 mg/dL)  Comp Met (CMET)     Status: Abnormal   Collection Time: 09/22/15  7:34 AM  Result Value Ref Range   Sodium 140 135 - 145 mEq/L   Potassium 4.3 3.5 -  5.1 mEq/L   Chloride 104 96 - 112 mEq/L   CO2 29 19 - 32 mEq/L   Glucose, Bld 152 (H) 70 - 99 mg/dL   BUN 16 6 - 23 mg/dL   Creatinine, Ser 1.16 0.40 - 1.50 mg/dL   Total Bilirubin 0.5 0.2 - 1.2 mg/dL   Alkaline Phosphatase 53 39 - 117 U/L   AST 30 0 - 37 U/L   ALT 36 0 - 53 U/L   Total Protein 6.8 6.0 - 8.3 g/dL   Albumin 4.3 3.5 -  5.2 g/dL   Calcium 9.2 8.4 - 10.5 mg/dL   GFR 66.90 >60.00 mL/min  Hemoglobin A1c     Status: Abnormal   Collection Time: 09/22/15  7:34 AM  Result Value Ref Range   Hgb A1c MFr Bld 6.6 (H) 4.6 - 6.5 %    Comment: Glycemic Control Guidelines for People with Diabetes:Non Diabetic:  <6%Goal of Therapy: <7%Additional Action Suggested:  >8%   Urinalysis, Routine w reflex microscopic     Status: Abnormal   Collection Time: 10/26/15  7:47 AM  Result Value Ref Range   Color, Urine YELLOW Yellow;Lt. Yellow   APPearance CLEAR Clear   Specific Gravity, Urine 1.025 1.000-1.030   pH 5.5 5.0 - 8.0   Total Protein, Urine NEGATIVE Negative   Urine Glucose NEGATIVE Negative   Ketones, ur NEGATIVE Negative   Bilirubin Urine NEGATIVE Negative   Hgb urine dipstick NEGATIVE Negative   Urobilinogen, UA 0.2 0.0 - 1.0   Leukocytes, UA NEGATIVE Negative   Nitrite NEGATIVE Negative   WBC, UA 3-6/hpf (A) 0-2/hpf   RBC / HPF 0-2/hpf 0-2/hpf   Squamous Epithelial / LPF Rare(0-4/hpf) Rare(0-4/hpf)    Comment: transitional epithelial present   Renal Epithel, UA Rare(0-4/hpf) (A) None  Urine culture     Status: None   Collection Time: 10/26/15  7:47 AM  Result Value Ref Range   Colony Count NO GROWTH    Organism ID, Bacteria NO GROWTH     Assessment/Plan: Left inguinal pain No palpable mass on exam. However history of left inguinal hernia repair 2 years ago. US Pelvis obtained that was negative. Supportive measures and OTC medications reviewed. If not improving, will need CT or referral back to general surgery for further assessment.

## 2015-11-06 NOTE — Assessment & Plan Note (Signed)
No palpable mass on exam. However history of left inguinal hernia repair 2 years ago. US Pelvis obtained that was negative. Supportive measures and OTC medications reviewed. If not improving, will need CT or referral back to general surgery for further assessment.

## 2015-11-06 NOTE — Patient Instructions (Addendum)
Please go downstairs to schedule your [place]. Continue Ibuprofen. Limit running until we get this assessed. Wear supportive undergarments.  Call me or come see me if symptoms change before workup is complete. If anything acutely worsens, please go to the ER

## 2015-11-09 ENCOUNTER — Encounter: Payer: Self-pay | Admitting: Physician Assistant

## 2015-11-09 ENCOUNTER — Encounter: Payer: Self-pay | Admitting: Family Medicine

## 2015-11-14 ENCOUNTER — Telehealth: Payer: Self-pay | Admitting: *Deleted

## 2015-11-14 NOTE — Telephone Encounter (Signed)
Forms filled out as much as possible and forwarded to Dr. Carollee Herter. JG//CMA

## 2015-11-15 NOTE — Telephone Encounter (Signed)
Called and informed pt that forms are ready for pick up at our front desk. Copy sent for scanning. JG//CMA  

## 2015-11-29 ENCOUNTER — Other Ambulatory Visit: Payer: Self-pay | Admitting: Family Medicine

## 2016-01-01 ENCOUNTER — Other Ambulatory Visit: Payer: Self-pay | Admitting: Family Medicine

## 2016-01-05 LAB — HM DIABETES EYE EXAM

## 2016-02-07 ENCOUNTER — Encounter: Payer: Self-pay | Admitting: Family Medicine

## 2016-03-01 ENCOUNTER — Other Ambulatory Visit: Payer: Self-pay | Admitting: Family Medicine

## 2016-03-12 ENCOUNTER — Encounter: Payer: Self-pay | Admitting: Family Medicine

## 2016-03-12 ENCOUNTER — Ambulatory Visit (INDEPENDENT_AMBULATORY_CARE_PROVIDER_SITE_OTHER): Payer: PRIVATE HEALTH INSURANCE | Admitting: Family Medicine

## 2016-03-12 VITALS — BP 122/70 | HR 61 | Temp 97.9°F | Ht 67.0 in | Wt 210.2 lb

## 2016-03-12 DIAGNOSIS — E785 Hyperlipidemia, unspecified: Secondary | ICD-10-CM | POA: Diagnosis not present

## 2016-03-12 DIAGNOSIS — IMO0002 Reserved for concepts with insufficient information to code with codable children: Secondary | ICD-10-CM

## 2016-03-12 DIAGNOSIS — E1151 Type 2 diabetes mellitus with diabetic peripheral angiopathy without gangrene: Secondary | ICD-10-CM | POA: Diagnosis not present

## 2016-03-12 DIAGNOSIS — E1165 Type 2 diabetes mellitus with hyperglycemia: Secondary | ICD-10-CM | POA: Diagnosis not present

## 2016-03-12 DIAGNOSIS — I10 Essential (primary) hypertension: Secondary | ICD-10-CM | POA: Diagnosis not present

## 2016-03-12 MED ORDER — ATORVASTATIN CALCIUM 20 MG PO TABS: 20.0000 mg | ORAL_TABLET | Freq: Every day | ORAL | 0 refills | Status: DC

## 2016-03-12 MED ORDER — METFORMIN HCL ER 500 MG PO TB24: 1000.0000 mg | ORAL_TABLET | Freq: Every day | ORAL | 1 refills | Status: DC

## 2016-03-12 MED ORDER — DILTIAZEM HCL ER COATED BEADS 120 MG PO CP24: 120.0000 mg | ORAL_CAPSULE | Freq: Every day | ORAL | 0 refills | Status: DC

## 2016-03-12 MED ORDER — LISINOPRIL-HYDROCHLOROTHIAZIDE 20-12.5 MG PO TABS: 1.0000 | ORAL_TABLET | Freq: Every day | ORAL | 1 refills | Status: DC

## 2016-03-12 NOTE — Progress Notes (Signed)
Patient ID: Charles Trujillo, male    DOB: 24-Oct-1950  Age: 65 y.o. MRN: [id number]    Subjective:  Subjective  HPI Charles Trujillo presents for f/u dm, cholesterol and bp.   HPI HYPERTENSION   Blood pressure range-not checking  Chest pain- no      Dyspnea- no Lightheadedness- no   Edema- no  Other side effects - no   Medication compliance: good Low salt diet- yes    DIABETES    Blood Sugar ranges-135-149  Polyuria- no New Visual problems- no  Hypoglycemic symptoms- no  Other side effects-no Medication compliance - good Last eye exam- few months ago Foot exam- today   HYPERLIPIDEMIA  Medication compliance- good RUQ pain- no  Muscle aches- no Other side effects-no    Review of Systems  Constitutional: Negative for appetite change, diaphoresis, fatigue and unexpected weight change.  Eyes: Negative for pain, redness and visual disturbance.  Respiratory: Negative for cough, chest tightness, shortness of breath and wheezing.   Cardiovascular: Negative for chest pain, palpitations and leg swelling.  Endocrine: Negative for cold intolerance, heat intolerance, polydipsia, polyphagia and polyuria.  Genitourinary: Negative for difficulty urinating, dysuria and frequency.  Neurological: Negative for dizziness, light-headedness, numbness and headaches.    History Past Medical History:  Diagnosis Date  . Anxiety   . Arthritis    bilateral knees  . Depression   . DM2 (diabetes mellitus, type 2) ([place])   . Dysrhythmia     benign tachycardia, takes [place]  . Elevated LFTs    previously evaluated by Dr Sharlett Iles; h/o "fatty liver"  . HLD (hyperlipidemia)   . Hypertension   . Inguinal hernia    right    He has a past surgical history that includes Knee surgery; Achilles tendon repair (1999); Squamous cell carcinoma excision; Knee arthroscopy (1993); Inguinal hernia repair (08/09/2011); Hernia repair (07/2011); Carpal tunnel with cubital tunnel (Left, 07/22/2014); and  Carpal tunnel release (Right, 11/04/2014).   His family history includes Alcohol abuse in his brother; Cancer in his maternal grandmother; Depression in his brother; Diabetes in his mother; Heart disease in his mother; Heart failure in his mother; Hypertension in his mother; Parkinsonism in his father; Stroke (age of onset: 60) in his father.He reports that he has been smoking Cigars.  He does not have any smokeless tobacco history on file. He reports that he drinks about 3.6 oz of alcohol per week . He reports that he does not use drugs.  Current Outpatient Prescriptions on File Prior to Visit  Medication Sig Dispense Refill  . DULoxetine (CYMBALTA) 60 MG capsule TAKE 1 CAPSULE BY MOUTH ONCE DAILY 90 capsule 3  . glucose blood (ONE TOUCH TEST STRIPS) test strip Check blood sugar twice daily 100 each 11  . HYDROmorphone (DILAUDID) 2 MG tablet Take 2 tablets (4 mg total) by mouth every 4 (four) hours as needed for moderate pain or severe pain. 45 tablet 0  . ibuprofen (ADVIL,MOTRIN) 200 MG tablet Take 400 mg by mouth every 6 (six) hours as needed. Pain     . meclizine (ANTIVERT) 12.5 MG tablet Take 1 tablet (12.5 mg total) by mouth 3 (three) times daily as needed for dizziness. 30 tablet 0  . metoprolol (LOPRESSOR) 50 MG tablet Take 1 tablet by mouth 2 (two) times daily.    . Mirabegron (MYRBETRIQ PO) Take by mouth.    Charles Trujillo DELICA LANCETS MISC 2 (two) times daily. accu check     . sildenafil (VIAGRA) 100 MG  tablet Take 1 tablet (100 mg total) by mouth as needed for erectile dysfunction. Take 100 mg by mouth daily as needed for Erectile Dysfunction. 10 tablet 2   No current facility-administered medications on file prior to visit.      Objective:  Objective  Physical Exam  Constitutional: He is oriented to person, place, and time. Vital signs are normal. He appears well-developed and well-nourished. He is sleeping.  HENT:  Head: Normocephalic and atraumatic.  Mouth/Throat: Oropharynx is  clear and moist.  Eyes: EOM are normal. Pupils are equal, round, and reactive to light.  Neck: Normal range of motion. Neck supple. No thyromegaly present.  Cardiovascular: Normal rate and regular rhythm.   No murmur heard. Pulmonary/Chest: Effort normal and breath sounds normal. No respiratory distress. He has no wheezes. He has no rales. He exhibits no tenderness.  Musculoskeletal: He exhibits no edema or tenderness.  Neurological: He is alert and oriented to person, place, and time.  Skin: Skin is warm and dry.  Psychiatric: He has a normal mood and affect. His behavior is normal. Judgment and thought content normal.  Nursing note and vitals reviewed. Sensory exam of the foot is normal, tested with the monofilament. Good pulses, no lesions or ulcers, good peripheral pulses.  BP 122/70 (BP Location: Left Arm, Patient Position: Sitting, Cuff Size: Large)   Pulse 61   Temp 97.9 F (36.6 C) (Oral)   Ht 5\' 7"  (1.702 m)   Wt 210 lb 3.2 oz (95.3 kg)   SpO2 98%   BMI 32.92 kg/m  Wt Readings from Last 3 Encounters:  03/12/16 210 lb 3.2 oz (95.3 kg)  11/06/15 213 lb 4.8 oz (96.8 kg)  03/27/15 208 lb (94.3 kg)     Lab Results  Component Value Date   WBC 7.4 04/22/2014   HGB 13.5 11/04/2014   HCT 44.9 04/22/2014   PLT 232.0 04/22/2014   GLUCOSE 152 (H) 09/22/2015   CHOL 141 09/22/2015   TRIG 52.0 09/22/2015   HDL 64.80 09/22/2015   LDLDIRECT 143.5 02/01/2011   LDLCALC 66 09/22/2015   ALT 36 09/22/2015   AST 30 09/22/2015   NA 140 09/22/2015   K 4.3 09/22/2015   CL 104 09/22/2015   CREATININE 1.16 09/22/2015   BUN 16 09/22/2015   CO2 29 09/22/2015   TSH 2.950 03/28/2015   PSA 0.33 04/03/2012   HGBA1C 6.6 (H) 09/22/2015   MICROALBUR 1.0 11/01/2014    US Pelvis Limited  Result Date: 11/06/2015 CLINICAL DATA:  Left inguinal pain. EXAM: US PELVIS LIMITED TECHNIQUE: Ultrasound examination of the pelvic soft tissues was performed in the area of clinical concern. COMPARISON:   Ultrasound 03/30/2015 . FINDINGS: No cystic or solid mass is noted in the region of clinical concern in the left lower quadrant. No hernia noted. If symptoms persist CT of the abdomen pelvis can be obtained. IMPRESSION: Negative exam. Electronically Signed   By: Marcello Moores  Register   On: 11/06/2015 08:53     Assessment & Plan:  Plan  I have discontinued Mr. Lawrence's celecoxib. I have changed his CARTIA XT to diltiazem. I have also changed his atorvastatin. Additionally, I am having him maintain his [facility] LANCETS, ibuprofen, sildenafil, glucose blood, meclizine, metoprolol, Mirabegron (MYRBETRIQ PO), HYDROmorphone, DULoxetine, metFORMIN, and lisinopril-hydrochlorothiazide.  Meds ordered this encounter  Medications  . metFORMIN (GLUCOPHAGE-XR) 500 MG 24 hr tablet    Sig: Take 2 tablets (1,000 mg total) by mouth daily with breakfast.    Dispense:  180 tablet  Refill:  1  . lisinopril-hydrochlorothiazide (PRINZIDE,ZESTORETIC) 20-12.5 MG tablet    Sig: Take 1 tablet by mouth daily.    Dispense:  90 tablet    Refill:  1  . diltiazem (CARTIA XT) 120 MG 24 hr capsule    Sig: Take 1 capsule (120 mg total) by mouth daily.    Dispense:  90 capsule    Refill:  0  . atorvastatin (LIPITOR) 20 MG tablet    Sig: Take 1 tablet (20 mg total) by mouth daily.    Dispense:  90 tablet    Refill:  0    Problem List Items Addressed This Visit      Unprioritized   Essential hypertension    Stable con't meds      Relevant Medications   lisinopril-hydrochlorothiazide (PRINZIDE,ZESTORETIC) 20-12.5 MG tablet   diltiazem (CARTIA XT) 120 MG 24 hr capsule   atorvastatin (LIPITOR) 20 MG tablet   Other Relevant Orders   Lipid panel   CBC with Differential/Platelet   Comprehensive metabolic panel    Other Visit Diagnoses    DM (diabetes mellitus) type II uncontrolled, periph vascular disorder ([place])    -  Primary   Relevant Medications   metFORMIN (GLUCOPHAGE-XR) 500 MG 24 hr tablet    lisinopril-hydrochlorothiazide (PRINZIDE,ZESTORETIC) 20-12.5 MG tablet   diltiazem (CARTIA XT) 120 MG 24 hr capsule   atorvastatin (LIPITOR) 20 MG tablet   Other Relevant Orders   Lipid panel   CBC with Differential/Platelet   Comprehensive metabolic panel   Hyperlipidemia       Relevant Medications   lisinopril-hydrochlorothiazide (PRINZIDE,ZESTORETIC) 20-12.5 MG tablet   diltiazem (CARTIA XT) 120 MG 24 hr capsule   atorvastatin (LIPITOR) 20 MG tablet   Other Relevant Orders   Lipid panel   CBC with Differential/Platelet   Comprehensive metabolic panel      Follow-up: Return if symptoms worsen or fail to improve.  [name], DO

## 2016-03-12 NOTE — Patient Instructions (Signed)

## 2016-03-12 NOTE — Progress Notes (Signed)
Pre visit review using our clinic review tool, if applicable. No additional management support is needed unless otherwise documented below in the visit note. 

## 2016-03-13 ENCOUNTER — Encounter: Payer: Self-pay | Admitting: Family Medicine

## 2016-03-13 NOTE — Assessment & Plan Note (Signed)
Stable Check labs 

## 2016-03-13 NOTE — Assessment & Plan Note (Signed)
Stable con't meds 

## 2016-03-19 ENCOUNTER — Ambulatory Visit: Payer: PRIVATE HEALTH INSURANCE | Admitting: Family Medicine

## 2016-03-25 ENCOUNTER — Other Ambulatory Visit: Payer: Self-pay | Admitting: Family Medicine

## 2016-03-26 ENCOUNTER — Ambulatory Visit: Payer: PRIVATE HEALTH INSURANCE | Admitting: Family Medicine

## 2016-03-28 ENCOUNTER — Other Ambulatory Visit (INDEPENDENT_AMBULATORY_CARE_PROVIDER_SITE_OTHER): Payer: PRIVATE HEALTH INSURANCE

## 2016-03-28 ENCOUNTER — Encounter: Payer: Self-pay | Admitting: Family Medicine

## 2016-03-28 DIAGNOSIS — IMO0002 Reserved for concepts with insufficient information to code with codable children: Secondary | ICD-10-CM

## 2016-03-28 DIAGNOSIS — I10 Essential (primary) hypertension: Secondary | ICD-10-CM | POA: Diagnosis not present

## 2016-03-28 DIAGNOSIS — E785 Hyperlipidemia, unspecified: Secondary | ICD-10-CM | POA: Diagnosis not present

## 2016-03-28 DIAGNOSIS — E1165 Type 2 diabetes mellitus with hyperglycemia: Secondary | ICD-10-CM | POA: Diagnosis not present

## 2016-03-28 DIAGNOSIS — R899 Unspecified abnormal finding in specimens from other organs, systems and tissues: Secondary | ICD-10-CM

## 2016-03-28 DIAGNOSIS — E1151 Type 2 diabetes mellitus with diabetic peripheral angiopathy without gangrene: Secondary | ICD-10-CM | POA: Diagnosis not present

## 2016-03-28 LAB — COMPREHENSIVE METABOLIC PANEL
ALT: 35 U/L (ref 0–53)
AST: 28 U/L (ref 0–37)
Albumin: 4.2 g/dL (ref 3.5–5.2)
Alkaline Phosphatase: 49 U/L (ref 39–117)
BUN: 18 mg/dL (ref 6–23)
CALCIUM: 8.8 mg/dL (ref 8.4–10.5)
CHLORIDE: 102 meq/L (ref 96–112)
CO2: 27 meq/L (ref 19–32)
Creatinine, Ser: 1.27 mg/dL (ref 0.40–1.50)
GFR: 60.17 mL/min (ref >60.00)
GLUCOSE: 157 mg/dL — AB (ref 70–99)
POTASSIUM: 3.8 meq/L (ref 3.5–5.1)
Sodium: 137 mEq/L (ref 135–145)
Total Bilirubin: 0.5 mg/dL (ref 0.2–1.2)
Total Protein: 6.7 g/dL (ref 6.0–8.3)

## 2016-03-28 LAB — LIPID PANEL
CHOL/HDL RATIO: 3
CHOLESTEROL: 135 mg/dL (ref 0–200)
HDL: 53.7 mg/dL (ref >39.00)
LDL CALC: 71 mg/dL (ref 0–99)
NonHDL: 81.52
TRIGLYCERIDES: 55 mg/dL (ref 0.0–149.0)
VLDL: 11 mg/dL (ref 0.0–40.0)

## 2016-03-28 LAB — CBC WITH DIFFERENTIAL/PLATELET
BASOS PCT: 0.7 % (ref 0.0–3.0)
Basophils Absolute: 0.1 10*3/uL (ref 0.0–0.1)
Eosinophils Absolute: 0.6 10*3/uL (ref 0.0–0.7)
Eosinophils Relative: 6.6 % — ABNORMAL HIGH (ref 0.0–5.0)
HEMATOCRIT: 44.6 % (ref 39.0–52.0)
Hemoglobin: 15.4 g/dL (ref 13.0–17.0)
LYMPHS ABS: 2.9 10*3/uL (ref 0.7–4.0)
LYMPHS PCT: 31 % (ref 12.0–46.0)
MCHC: 34.5 g/dL (ref 30.0–36.0)
MCV: 91 fl (ref 78.0–100.0)
MONOS PCT: 8.5 % (ref 3.0–12.0)
Monocytes Absolute: 0.8 10*3/uL (ref 0.1–1.0)
NEUTROS ABS: 5 10*3/uL (ref 1.4–7.7)
NEUTROS PCT: 53.2 % (ref 43.0–77.0)
PLATELETS: 252 10*3/uL (ref 150.0–400.0)
RBC: 4.9 Mil/uL (ref 4.22–5.81)
RDW: 12.8 % (ref 11.5–15.5)
WBC: 9.3 10*3/uL (ref 4.0–10.5)

## 2016-03-28 LAB — COMPLETE METABOLIC PANEL WITH GFR
ALT: 34 U/L (ref 9–46)
AST: 29 U/L (ref 10–35)
Albumin: 4.1 g/dL (ref 3.6–5.1)
Alkaline Phosphatase: 45 U/L (ref 40–115)
BILIRUBIN TOTAL: 0.5 mg/dL (ref 0.2–1.2)
BUN: 17 mg/dL (ref 7–25)
CALCIUM: 9.1 mg/dL (ref 8.6–10.3)
CHLORIDE: 105 mmol/L (ref 98–110)
CO2: 26 mmol/L (ref 20–31)
CREATININE: 1.26 mg/dL — AB (ref 0.70–1.25)
GFR, EST AFRICAN AMERICAN: 68 mL/min (ref >=60)
GFR, EST NON AFRICAN AMERICAN: 59 mL/min — AB (ref >=60)
Glucose, Bld: 156 mg/dL — ABNORMAL HIGH (ref 65–99)
Potassium: 4.2 mmol/L (ref 3.5–5.3)
Sodium: 139 mmol/L (ref 135–146)
Total Protein: 6.3 g/dL (ref 6.1–8.1)

## 2016-03-29 ENCOUNTER — Other Ambulatory Visit (INDEPENDENT_AMBULATORY_CARE_PROVIDER_SITE_OTHER): Payer: PRIVATE HEALTH INSURANCE

## 2016-03-29 DIAGNOSIS — R739 Hyperglycemia, unspecified: Secondary | ICD-10-CM | POA: Diagnosis not present

## 2016-03-29 LAB — HEMOGLOBIN A1C: HEMOGLOBIN A1C: 7.3 % — AB (ref 4.6–6.5)

## 2016-04-01 ENCOUNTER — Encounter: Payer: Self-pay | Admitting: Family Medicine

## 2016-04-01 ENCOUNTER — Other Ambulatory Visit: Payer: Self-pay | Admitting: Family Medicine

## 2016-04-01 NOTE — Telephone Encounter (Signed)
Last filled 04/25/15 #90 with 1 Removed from his medication list on 03/12/16 per patient he completed his dose.    Please advise     KP

## 2016-04-05 ENCOUNTER — Other Ambulatory Visit: Payer: Self-pay | Admitting: Family Medicine

## 2016-04-05 DIAGNOSIS — E1165 Type 2 diabetes mellitus with hyperglycemia: Principal | ICD-10-CM

## 2016-04-05 DIAGNOSIS — IMO0002 Reserved for concepts with insufficient information to code with codable children: Secondary | ICD-10-CM

## 2016-04-05 DIAGNOSIS — E1151 Type 2 diabetes mellitus with diabetic peripheral angiopathy without gangrene: Secondary | ICD-10-CM

## 2016-04-05 MED ORDER — SITAGLIP PHOS-METFORMIN HCL ER 100-1000 MG PO TB24: ORAL_TABLET | ORAL | 2 refills | Status: DC

## 2016-04-05 NOTE — Telephone Encounter (Signed)
Please advise on the dose and directions of the Janumet.   KP

## 2016-05-02 ENCOUNTER — Other Ambulatory Visit: Payer: Self-pay | Admitting: Family Medicine

## 2016-05-08 ENCOUNTER — Encounter: Payer: Self-pay | Admitting: Family Medicine

## 2016-07-11 ENCOUNTER — Other Ambulatory Visit: Payer: Self-pay | Admitting: Family Medicine

## 2016-07-11 DIAGNOSIS — IMO0002 Reserved for concepts with insufficient information to code with codable children: Secondary | ICD-10-CM

## 2016-07-11 DIAGNOSIS — E1165 Type 2 diabetes mellitus with hyperglycemia: Principal | ICD-10-CM

## 2016-07-11 DIAGNOSIS — E1151 Type 2 diabetes mellitus with diabetic peripheral angiopathy without gangrene: Secondary | ICD-10-CM

## 2016-07-16 ENCOUNTER — Other Ambulatory Visit: Payer: Self-pay | Admitting: Family Medicine

## 2016-07-16 DIAGNOSIS — E785 Hyperlipidemia, unspecified: Secondary | ICD-10-CM

## 2016-07-16 NOTE — Telephone Encounter (Signed)
Received Rx request (Atorvastatin 40mg ; #45, 0 RF; Take one-half tab po qd).    Looked into Patient's Medication Hx and didn't see where Atorvastatin 40mg  has been prescribed by you. I do however see where Atorvastatin 20mg  has been prescribed (take 1 tab po qd; Last RF written on 03/12/2016).   Please advise.

## 2016-07-22 ENCOUNTER — Encounter: Payer: Self-pay | Admitting: Family Medicine

## 2016-07-24 ENCOUNTER — Other Ambulatory Visit: Payer: Self-pay | Admitting: *Deleted

## 2016-07-24 DIAGNOSIS — E785 Hyperlipidemia, unspecified: Secondary | ICD-10-CM

## 2016-07-24 MED ORDER — ATORVASTATIN CALCIUM 20 MG PO TABS: 20.0000 mg | ORAL_TABLET | Freq: Every day | ORAL | 0 refills | Status: DC

## 2016-07-24 NOTE — Telephone Encounter (Signed)
Ok to change to 20 mg

## 2016-08-15 ENCOUNTER — Encounter: Payer: Self-pay | Admitting: Family Medicine

## 2016-08-15 ENCOUNTER — Other Ambulatory Visit: Payer: Self-pay | Admitting: Family Medicine

## 2016-08-15 DIAGNOSIS — IMO0002 Reserved for concepts with insufficient information to code with codable children: Secondary | ICD-10-CM

## 2016-08-15 DIAGNOSIS — E1165 Type 2 diabetes mellitus with hyperglycemia: Principal | ICD-10-CM

## 2016-08-15 DIAGNOSIS — E1151 Type 2 diabetes mellitus with diabetic peripheral angiopathy without gangrene: Secondary | ICD-10-CM

## 2016-08-15 MED ORDER — METFORMIN HCL ER 500 MG PO TB24: 1000.0000 mg | ORAL_TABLET | Freq: Every day | ORAL | 1 refills | Status: DC

## 2016-08-15 MED ORDER — DAPAGLIFLOZIN PROPANEDIOL 5 MG PO TABS: 5.0000 mg | ORAL_TABLET | Freq: Every day | ORAL | 2 refills | Status: DC

## 2016-08-15 NOTE — Telephone Encounter (Signed)
Metformin xr 500 mg  2 po qd, # 60  5 refills  farxiga 5 mg 1 po qd  #30   2 refills  D/c janumet------(januvia + metformin)  Start meds above

## 2016-08-27 ENCOUNTER — Other Ambulatory Visit: Payer: Self-pay | Admitting: Family Medicine

## 2016-08-27 DIAGNOSIS — I10 Essential (primary) hypertension: Secondary | ICD-10-CM

## 2016-10-17 ENCOUNTER — Other Ambulatory Visit: Payer: Self-pay | Admitting: Family Medicine

## 2016-10-17 DIAGNOSIS — I10 Essential (primary) hypertension: Secondary | ICD-10-CM

## 2016-11-20 ENCOUNTER — Other Ambulatory Visit: Payer: Self-pay | Admitting: Family Medicine

## 2016-11-26 ENCOUNTER — Other Ambulatory Visit: Payer: Self-pay | Admitting: Family Medicine

## 2016-11-26 DIAGNOSIS — I10 Essential (primary) hypertension: Secondary | ICD-10-CM

## 2017-01-09 ENCOUNTER — Other Ambulatory Visit: Payer: Self-pay | Admitting: Family Medicine

## 2017-01-09 DIAGNOSIS — I10 Essential (primary) hypertension: Secondary | ICD-10-CM

## 2017-01-16 ENCOUNTER — Other Ambulatory Visit: Payer: Self-pay | Admitting: Family Medicine

## 2017-01-26 ENCOUNTER — Other Ambulatory Visit: Payer: Self-pay | Admitting: Family Medicine

## 2017-02-06 ENCOUNTER — Other Ambulatory Visit: Payer: Self-pay | Admitting: Family Medicine

## 2017-02-06 DIAGNOSIS — I10 Essential (primary) hypertension: Secondary | ICD-10-CM

## 2017-02-19 ENCOUNTER — Other Ambulatory Visit: Payer: Self-pay | Admitting: Family Medicine

## 2017-02-26 ENCOUNTER — Other Ambulatory Visit: Payer: Self-pay | Admitting: Family Medicine

## 2017-02-26 DIAGNOSIS — I10 Essential (primary) hypertension: Secondary | ICD-10-CM

## 2017-03-12 ENCOUNTER — Other Ambulatory Visit: Payer: Self-pay | Admitting: Family Medicine

## 2017-03-12 DIAGNOSIS — I10 Essential (primary) hypertension: Secondary | ICD-10-CM

## 2017-03-12 NOTE — Telephone Encounter (Signed)
Sent rx to pharmacy. LB 

## 2017-03-21 ENCOUNTER — Other Ambulatory Visit: Payer: Self-pay | Admitting: Family Medicine

## 2017-03-24 NOTE — Telephone Encounter (Signed)
Patient has appt on 9.8.18 will give refill at [facility]/thx dmf

## 2017-03-25 ENCOUNTER — Ambulatory Visit (INDEPENDENT_AMBULATORY_CARE_PROVIDER_SITE_OTHER): Payer: PRIVATE HEALTH INSURANCE | Admitting: Family Medicine

## 2017-03-25 ENCOUNTER — Encounter: Payer: Self-pay | Admitting: Family Medicine

## 2017-03-25 VITALS — BP 120/70 | HR 92 | Temp 98.3°F | Ht 67.0 in | Wt 209.8 lb

## 2017-03-25 DIAGNOSIS — E1151 Type 2 diabetes mellitus with diabetic peripheral angiopathy without gangrene: Secondary | ICD-10-CM | POA: Diagnosis not present

## 2017-03-25 DIAGNOSIS — Z1159 Encounter for screening for other viral diseases: Secondary | ICD-10-CM | POA: Diagnosis not present

## 2017-03-25 DIAGNOSIS — E1165 Type 2 diabetes mellitus with hyperglycemia: Secondary | ICD-10-CM | POA: Diagnosis not present

## 2017-03-25 DIAGNOSIS — I10 Essential (primary) hypertension: Secondary | ICD-10-CM | POA: Diagnosis not present

## 2017-03-25 DIAGNOSIS — E785 Hyperlipidemia, unspecified: Secondary | ICD-10-CM | POA: Diagnosis not present

## 2017-03-25 DIAGNOSIS — IMO0002 Reserved for concepts with insufficient information to code with codable children: Secondary | ICD-10-CM

## 2017-03-25 DIAGNOSIS — Z23 Encounter for immunization: Secondary | ICD-10-CM

## 2017-03-25 LAB — COMPREHENSIVE METABOLIC PANEL
ALBUMIN: 4.3 g/dL (ref 3.5–5.2)
ALT: 39 U/L (ref 0–53)
AST: 33 U/L (ref 0–37)
Alkaline Phosphatase: 40 U/L (ref 39–117)
BUN: 20 mg/dL (ref 6–23)
CALCIUM: 9.5 mg/dL (ref 8.4–10.5)
CHLORIDE: 102 meq/L (ref 96–112)
CO2: 29 meq/L (ref 19–32)
Creatinine, Ser: 1.19 mg/dL (ref 0.40–1.50)
GFR: 64.66 mL/min (ref >60.00)
Glucose, Bld: 158 mg/dL — ABNORMAL HIGH (ref 70–99)
POTASSIUM: 4.8 meq/L (ref 3.5–5.1)
Sodium: 138 mEq/L (ref 135–145)
Total Bilirubin: 0.6 mg/dL (ref 0.2–1.2)
Total Protein: 6.6 g/dL (ref 6.0–8.3)

## 2017-03-25 LAB — LIPID PANEL
CHOLESTEROL: 137 mg/dL (ref 0–200)
HDL: 61.6 mg/dL (ref >39.00)
LDL Cholesterol: 65 mg/dL (ref 0–99)
NONHDL: 75.48
Total CHOL/HDL Ratio: 2
Triglycerides: 53 mg/dL (ref 0.0–149.0)
VLDL: 10.6 mg/dL (ref 0.0–40.0)

## 2017-03-25 LAB — HEMOGLOBIN A1C: HEMOGLOBIN A1C: 7.3 % — AB (ref 4.6–6.5)

## 2017-03-25 MED ORDER — ONETOUCH DELICA LANCETS FINE MISC: 3 refills | Status: DC

## 2017-03-25 MED ORDER — GLUCOSE BLOOD VI STRP: ORAL_STRIP | 11 refills | Status: DC

## 2017-03-25 MED ORDER — DAPAGLIFLOZIN PROPANEDIOL 5 MG PO TABS: 5.0000 mg | ORAL_TABLET | Freq: Every day | ORAL | 3 refills | Status: DC

## 2017-03-25 NOTE — Patient Instructions (Signed)

## 2017-03-25 NOTE — Assessment & Plan Note (Signed)
Well controlled, no changes to meds. Encouraged heart healthy diet such as the DASH diet and exercise as tolerated.  °

## 2017-03-25 NOTE — Assessment & Plan Note (Signed)
Tolerating statin, encouraged heart healthy diet, avoid trans fats, minimize simple carbs and saturated fats. Increase exercise as tolerated 

## 2017-03-25 NOTE — Progress Notes (Signed)
Patient ID: Charles Trujillo, male    DOB: [birth year]  Age: 66 y.o. MRN: [id number]    Subjective:  Subjective  HPI Charles Trujillo presents for f/u dm, bp and cholesterol  HYPERTENSION   Blood pressure range-not checking   Chest pain- no      Dyspnea- no Lightheadedness- no   Edema- no  Other side effects - no   Medication compliance: good Low salt diet- yes    DIABETES    Blood Sugar ranges-140   Polyuria- no New Visual problems- no  Hypoglycemic symptoms- no  Other side effects-no Medication compliance - good Last eye exam- due Foot exam- today   HYPERLIPIDEMIA  Medication compliance- good RUQ pain- no  Muscle aches- no Other side effects-no  Review of Systems  Constitutional: Negative for fever.  HENT: Negative for congestion.   Respiratory: Negative for shortness of breath.   Cardiovascular: Negative for chest pain, palpitations and leg swelling.  Gastrointestinal: Negative for abdominal pain, blood in stool and nausea.  Genitourinary: Negative for dysuria and frequency.  Skin: Negative for rash.  Allergic/Immunologic: Negative for environmental allergies.  Neurological: Negative for dizziness and headaches.  Psychiatric/Behavioral: The patient is not nervous/anxious.     History Past Medical History:  Diagnosis Date  . Anxiety   . Arthritis    bilateral knees  . Depression   . DM2 (diabetes mellitus, type 2) ([place])   . Dysrhythmia     benign tachycardia, takes [place]  . Elevated LFTs    previously evaluated by Dr Sharlett Iles; h/o "fatty liver"  . HLD (hyperlipidemia)   . Hypertension   . Inguinal hernia    right    He has a past surgical history that includes Knee surgery; Achilles tendon repair (1999); Squamous cell carcinoma excision; Knee arthroscopy (1993); Inguinal hernia repair (08/09/2011); Hernia repair (07/2011); Carpal tunnel with cubital tunnel (Left, 07/22/2014); and Carpal tunnel release (Right, 11/04/2014).   His family history  includes Alcohol abuse in his brother; Cancer in his maternal grandmother; Depression in his brother; Diabetes in his mother; Heart disease in his mother; Heart failure in his mother; Hypertension in his mother; Parkinsonism in his father; Stroke (age of onset: 57) in his father.He reports that he has been smoking Cigars.  He has never used smokeless tobacco. He reports that he drinks about 3.6 oz of alcohol per week . He reports that he does not use drugs.  Current Outpatient Prescriptions on File Prior to Visit  Medication Sig Dispense Refill  . atorvastatin (LIPITOR) 20 MG tablet Take 1 tablet (20 mg total) by mouth daily. 90 tablet 0  . CARTIA XT 120 MG 24 hr capsule TAKE 1 CAPSULE BY MOUTH DAILY. 90 capsule 0  . celecoxib (CELEBREX) 200 MG capsule TAKE ONE CAPSULE BY MOUTH ONCE DAILY 90 capsule 0  . DULoxetine (CYMBALTA) 60 MG capsule TAKE ONE CAPSULE BY MOUTH ONCE DAILY 90 capsule 3  . HYDROmorphone (DILAUDID) 2 MG tablet Take 2 tablets (4 mg total) by mouth every 4 (four) hours as needed for moderate pain or severe pain. 45 tablet 0  . ibuprofen (ADVIL,MOTRIN) 200 MG tablet Take 400 mg by mouth every 6 (six) hours as needed. Pain     . lisinopril-hydrochlorothiazide (PRINZIDE,ZESTORETIC) 20-12.5 MG tablet TAKE 1 TABLET BY MOUTH DAILY. 30 tablet 0  . metFORMIN (GLUCOPHAGE-XR) 500 MG 24 hr tablet Take 2 tablets (1,000 mg total) by mouth daily with breakfast. 180 tablet 1  . metoprolol (LOPRESSOR) 50 MG tablet Take 1  tablet by mouth 2 (two) times daily.    . Mirabegron (MYRBETRIQ PO) Take by mouth.    . sildenafil (VIAGRA) 100 MG tablet Take 1 tablet (100 mg total) by mouth as needed for erectile dysfunction. Take 100 mg by mouth daily as needed for Erectile Dysfunction. 10 tablet 2  . meclizine (ANTIVERT) 12.5 MG tablet Take 1 tablet (12.5 mg total) by mouth 3 (three) times daily as needed for dizziness. (Patient not taking: Reported on 03/25/2017) 30 tablet 0   No current facility-administered  medications on file prior to visit.      Objective:  Objective  Physical Exam  Constitutional: He is oriented to person, place, and time. Vital signs are normal. He appears well-developed and well-nourished. He is sleeping.  HENT:  Head: Normocephalic and atraumatic.  Mouth/Throat: Oropharynx is clear and moist.  Eyes: Pupils are equal, round, and reactive to light. EOM are normal.  Neck: Normal range of motion. Neck supple. No thyromegaly present.  Cardiovascular: Normal rate and regular rhythm.   No murmur heard. Pulmonary/Chest: Effort normal and breath sounds normal. No respiratory distress. He has no wheezes. He has no rales. He exhibits no tenderness.  Musculoskeletal: He exhibits no edema or tenderness.  Neurological: He is alert and oriented to person, place, and time.  Skin: Skin is warm and dry.  Psychiatric: He has a normal mood and affect. His behavior is normal. Judgment and thought content normal.  Nursing note and vitals reviewed. Sensory exam of the foot is normal, tested with the monofilament. Good pulses, no lesions or ulcers, good peripheral pulses.  BP 120/70 (BP Location: Left Arm)   Pulse 92   Temp 98.3 F (36.8 C) (Oral)   Ht 5\' 7"  (1.702 m)   Wt 209 lb 12.8 oz (95.2 kg)   SpO2 95%   BMI 32.86 kg/m  Wt Readings from Last 3 Encounters:  03/25/17 209 lb 12.8 oz (95.2 kg)  03/12/16 210 lb 3.2 oz (95.3 kg)  11/06/15 213 lb 4.8 oz (96.8 kg)     Lab Results  Component Value Date   WBC 9.3 03/28/2016   HGB 15.4 03/28/2016   HCT 44.6 03/28/2016   PLT 252.0 03/28/2016   GLUCOSE 158 (H) 03/25/2017   CHOL 137 03/25/2017   TRIG 53.0 03/25/2017   HDL 61.60 03/25/2017   LDLDIRECT 143.5 02/01/2011   LDLCALC 65 03/25/2017   ALT 39 03/25/2017   AST 33 03/25/2017   NA 138 03/25/2017   K 4.8 03/25/2017   CL 102 03/25/2017   CREATININE 1.19 03/25/2017   BUN 20 03/25/2017   CO2 29 03/25/2017   TSH 2.950 03/28/2015   PSA 0.33 04/03/2012   HGBA1C 7.3 (H)  03/25/2017   MICROALBUR 1.0 11/01/2014    US Pelvis Limited  Result Date: 11/06/2015 CLINICAL DATA:  Left inguinal pain. EXAM: US PELVIS LIMITED TECHNIQUE: Ultrasound examination of the pelvic soft tissues was performed in the area of clinical concern. COMPARISON:  Ultrasound 03/30/2015 . FINDINGS: No cystic or solid mass is noted in the region of clinical concern in the left lower quadrant. No hernia noted. If symptoms persist CT of the abdomen pelvis can be obtained. IMPRESSION: Negative exam. Electronically Signed   By: Marcello Moores  Register   On: 11/06/2015 08:53     Assessment & Plan:  Plan  I have discontinued Mr. Jett Fukuda DELICA LANCETS. I have also changed his FARXIGA to dapagliflozin propanediol. Additionally, I am having him start on [place]. Lastly,  I am having him maintain his ibuprofen, sildenafil, meclizine, metoprolol tartrate, Mirabegron (MYRBETRIQ PO), HYDROmorphone, DULoxetine, atorvastatin, metFORMIN, celecoxib, CARTIA XT, lisinopril-hydrochlorothiazide, and glucose blood.  Meds ordered this encounter  Medications  . glucose blood (ONE TOUCH TEST STRIPS) test strip    Sig: Check blood sugar twice daily    Dispense:  100 each    Refill:  11    delica  . dapagliflozin propanediol (FARXIGA) 5 MG TABS tablet    Sig: Take 5 mg by mouth daily.    Dispense:  90 tablet    Refill:  3    NEEDS TO SCHEDULE APPOINTMENT WITH PRIMARY CARE PHYSICIAN TO CONTINUE REFILLS  . ONETOUCH DELICA LANCETS FINE MISC    Sig: Use qd    Dispense:  100 each    Refill:  3    Problem List Items Addressed This Visit      Unprioritized   Diabetes mellitus type II, controlled ([place])    hgba1c to be done, minimize simple carbs. Increase exercise as tolerated. Continue current meds      Relevant Medications   dapagliflozin propanediol (FARXIGA) 5 MG TABS tablet   Essential hypertension    Well controlled, no changes to meds. Encouraged heart healthy diet such as the  DASH diet and exercise as tolerated.       Relevant Orders   Lipid panel (Completed)   Hemoglobin A1c (Completed)   Comprehensive metabolic panel (Completed)   HLD (hyperlipidemia)    Tolerating statin, encouraged heart healthy diet, avoid trans fats, minimize simple carbs and saturated fats. Increase exercise as tolerated       Other Visit Diagnoses    DM (diabetes mellitus) type II uncontrolled, periph vascular disorder ([place])    -  Primary   Relevant Medications   glucose blood (ONE TOUCH TEST STRIPS) test strip   dapagliflozin propanediol (FARXIGA) 5 MG TABS tablet   ONETOUCH DELICA LANCETS FINE MISC   Other Relevant Orders   Lipid panel (Completed)   Hemoglobin A1c (Completed)   Comprehensive metabolic panel (Completed)   Hyperlipidemia LDL goal <70       Relevant Orders   Lipid panel (Completed)   Hemoglobin A1c (Completed)   Comprehensive metabolic panel (Completed)   Need for hepatitis C screening test       Relevant Orders   Hepatitis C antibody (Completed)      Follow-up: Return in about 6 months (around 09/22/2017) for hypertension, hyperlipidemia, diabetes II.  [name], DO

## 2017-03-25 NOTE — Assessment & Plan Note (Signed)
hgba1c to be done, minimize simple carbs. Increase exercise as tolerated. Continue current meds  

## 2017-03-26 LAB — HEPATITIS C ANTIBODY
HEP C AB: NONREACTIVE
SIGNAL TO CUT-OFF: 0.01 (ref <1.00)

## 2017-03-27 ENCOUNTER — Other Ambulatory Visit: Payer: Self-pay | Admitting: Family Medicine

## 2017-03-27 DIAGNOSIS — E119 Type 2 diabetes mellitus without complications: Secondary | ICD-10-CM

## 2017-03-27 DIAGNOSIS — E785 Hyperlipidemia, unspecified: Secondary | ICD-10-CM

## 2017-03-27 MED ORDER — DAPAGLIFLOZIN PROPANEDIOL 10 MG PO TABS: 10.0000 mg | ORAL_TABLET | Freq: Every day | ORAL | 3 refills | Status: DC

## 2017-04-01 ENCOUNTER — Encounter: Payer: Self-pay | Admitting: Family Medicine

## 2017-04-01 NOTE — Telephone Encounter (Signed)
janumet xr 100/1000 1 po qd  #90  1 refills

## 2017-04-02 MED ORDER — SITAGLIP PHOS-METFORMIN HCL ER 100-1000 MG PO TB24: 1.0000 | ORAL_TABLET | Freq: Every day | ORAL | 1 refills | Status: DC

## 2017-04-02 NOTE — Telephone Encounter (Signed)
Faxed Janumet XR 100-1000mg  1qd #90+1 per YL to pharm on file/sent pt message via MyChart/thx dmf

## 2017-04-04 ENCOUNTER — Encounter: Payer: Self-pay | Admitting: Family Medicine

## 2017-04-04 MED ORDER — SITAGLIP PHOS-METFORMIN HCL ER 100-1000 MG PO TB24: 1.0000 | ORAL_TABLET | Freq: Every day | ORAL | 1 refills | Status: DC

## 2017-04-07 ENCOUNTER — Encounter: Payer: Self-pay | Admitting: Family Medicine

## 2017-04-07 NOTE — Telephone Encounter (Signed)
Notes recorded by Ewing, Robin B, CMA on 03/27/2017 at 8:49 AM EDT Called informed the patient of results/instructions. Put in order for labs to be done at the [place] office. Sent in the [place] to his pharmacy. He had no other questions or concerns.  ------  Notes recorded by Marrion Coy, CMA on 03/26/2017 at 2:09 PM EDT LMOVM for pt to RTC to discuss lab results/thx dmf ------  Notes recorded by [name], DO on 03/26/2017 at 11:48 AM EDT Dm -- inc farxiga to 10 mg daily Cholesterol----Cholesterol--- LDL goal < 70, HDL >40, TG < 150. Diet and exercise will increase HDL and decrease LDL and TG. Fish, Fish Oil, Flaxseed oil will also help increase the HDL and decrease Triglycerides.  Recheck labs in 3 months Lipid, cmp, hgba1c--- lab only is ok .

## 2017-04-09 ENCOUNTER — Encounter: Payer: Self-pay | Admitting: Family Medicine

## 2017-04-10 ENCOUNTER — Encounter: Payer: Self-pay | Admitting: Family Medicine

## 2017-04-14 ENCOUNTER — Other Ambulatory Visit: Payer: Self-pay | Admitting: Family Medicine

## 2017-04-14 DIAGNOSIS — I10 Essential (primary) hypertension: Secondary | ICD-10-CM

## 2017-04-18 ENCOUNTER — Encounter: Payer: Self-pay | Admitting: Family Medicine

## 2017-04-18 ENCOUNTER — Encounter: Payer: Self-pay | Admitting: Gastroenterology

## 2017-04-23 ENCOUNTER — Other Ambulatory Visit: Payer: Self-pay | Admitting: Family Medicine

## 2017-05-01 ENCOUNTER — Encounter: Payer: Self-pay | Admitting: Internal Medicine

## 2017-05-05 ENCOUNTER — Other Ambulatory Visit: Payer: Self-pay | Admitting: Family Medicine

## 2017-05-06 LAB — HM DIABETES EYE EXAM

## 2017-05-06 NOTE — Telephone Encounter (Signed)
Rx approved and sent to the pharmacy by e-script.//AB/CMA 

## 2017-05-12 ENCOUNTER — Other Ambulatory Visit: Payer: Self-pay | Admitting: Family Medicine

## 2017-05-12 ENCOUNTER — Encounter: Payer: Self-pay | Admitting: Family Medicine

## 2017-05-12 DIAGNOSIS — I10 Essential (primary) hypertension: Secondary | ICD-10-CM

## 2017-05-12 MED ORDER — LISINOPRIL-HYDROCHLOROTHIAZIDE 20-12.5 MG PO TABS: 1.0000 | ORAL_TABLET | Freq: Every day | ORAL | 5 refills | Status: DC

## 2017-05-23 ENCOUNTER — Encounter: Payer: Self-pay | Admitting: Family Medicine

## 2017-05-27 ENCOUNTER — Other Ambulatory Visit: Payer: Self-pay | Admitting: Family Medicine

## 2017-05-27 DIAGNOSIS — I10 Essential (primary) hypertension: Secondary | ICD-10-CM

## 2017-05-27 MED ORDER — DILTIAZEM HCL ER COATED BEADS 120 MG PO CP24: 120.0000 mg | ORAL_CAPSULE | Freq: Every day | ORAL | 1 refills | Status: DC

## 2017-06-10 ENCOUNTER — Ambulatory Visit (AMBULATORY_SURGERY_CENTER): Payer: Self-pay

## 2017-06-10 VITALS — Ht 67.0 in | Wt 211.4 lb

## 2017-06-10 DIAGNOSIS — Z1211 Encounter for screening for malignant neoplasm of colon: Secondary | ICD-10-CM

## 2017-06-10 NOTE — Progress Notes (Signed)
Per pt, no allergies to soy or egg products.Pt not taking any weight loss meds or using  O2 at home.   Pt refused Emmi video. 

## 2017-06-19 NOTE — Progress Notes (Signed)
[name] [address], [place] [address] Phone: [phone] Subjective:     CC: Bilateral knee pain  FUX:[id number]  Charles Trujillo is a 66 y.o. male coming in with complaint of bilateral knee pain.  Found to have medial compartment arthritis 3 years ago. Patient was told that he needs both knee replaced.  Patient does have worsening pain.  Affecting daily activities.  Unable to walk greater than 300 feet without having severe amount of pain.      Patient did have x-rays taken March 2015.  Was found to have significant narrowing of the medial joint compartment bilaterally.    Past Medical History:  Diagnosis Date  . Anxiety   . Arthritis    bilateral knees  . Depression   . DM2 (diabetes mellitus, type 2) ([place])   . Dysrhythmia     benign tachycardia, takes [place]  . Elevated LFTs    previously evaluated by Dr Sharlett Iles; h/o "fatty liver"  . HLD (hyperlipidemia)   . Hypertension   . Inguinal hernia    right  . Squamous cell carcinoma    top of head   Past Surgical History:  Procedure Laterality Date  . ACHILLES TENDON REPAIR  1999  . CARPAL TUNNEL RELEASE Right 11/04/2014   Procedure: LIMITED OPEN RIGHT CARPAL TUNNEL RELEASE;  Surgeon: Roseanne Kaufman, MD;  Location: [facility];  Service: Orthopedics;  Laterality: Right;  . CARPAL TUNNEL WITH CUBITAL TUNNEL Left 07/22/2014   Procedure: LEFT CARPAL TUNNEL RELEASE AND CUBITAL TUNNEL RELEASE TRANSPOSITION;  Surgeon: Roseanne Kaufman, MD;  Location: [facility];  Service: Orthopedics;  Laterality: Left;  . HERNIA REPAIR  07/2011   left side  . INGUINAL HERNIA REPAIR  08/09/2011   Procedure: LAPAROSCOPIC INGUINAL HERNIA;  Surgeon: Joyice Faster. Cornett, MD;  Location: [facility];  Service: General;  Laterality: Right;  . KNEE ARTHROSCOPY  1993   left knee  . KNEE SURGERY     age 66- right  . SQUAMOUS CELL CARCINOMA EXCISION     left knee   Social History    Socioeconomic History  . Marital status: Married    Spouse name: None  . Number of children: 1  . Years of education: None  . Highest education level: None  Social Needs  . Financial resource strain: None  . Food insecurity - worry: None  . Food insecurity - inability: None  . Transportation needs - medical: None  . Transportation needs - non-medical: None  Occupational History  . 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 Smoking status: Current Some Day Smoker    Types: Cigars  . Smokeless tobacco: Never Used  . Tobacco comment: smokes one every other day  Substance and Sexual Activity  . Alcohol use: Yes    Alcohol/week: 1.8 - 2.4 oz    Types: 3 - 4 Cans of beer per week    Comment: socia;  . Drug use: No  . Sexual activity: Yes    Partners: Female  Other Topics Concern  . None  Social History Narrative   Exercise--- not regularly   Allergies  Allergen Reactions  . Codeine Nausea Only  . Codeine Phosphate     REACTION: nausea   Family History  Problem Relation Age of Onset  . Diabetes Mother   . Hypertension Mother   . Heart disease Mother   . Heart failure Mother   . Stroke Father 59  .  Parkinsonism Father   . Depression Brother   . Alcohol abuse Brother   . Cancer Maternal Grandmother        bone     Past medical history, social, surgical and family history all reviewed in electronic medical record.  No pertanent information unless stated regarding to the chief complaint.   Review of Systems:Review of systems updated and as accurate as of 06/20/17  No headache, visual changes, nausea, vomiting, diarrhea, constipation, dizziness, abdominal pain, skin rash, fevers, chills, night sweats, weight loss, swollen lymph nodes, body aches, joint swelling, chest pain, shortness of breath, mood changes.  Positive muscle aches  Objective  Blood pressure 128/70, pulse (!) 117, height 5\' 7"  (1.702 m), weight 206 lb (93.4 kg), SpO2 96  %. Systems examined below as of 06/20/17   General: No apparent distress alert and oriented x3 mood and affect normal, dressed appropriately.  HEENT: Pupils equal, extraocular movements intact  Respiratory: Patient's speak in full sentences and does not appear short of breath  Cardiovascular: No lower extremity edema, non tender, no erythema  Skin: Warm dry intact with no signs of infection or rash on extremities or on axial skeleton.  Abdomen: Soft nontender  Neuro: Cranial nerves II through XII are intact, neurovascularly intact in all extremities with 2+ DTRs and 2+ pulses.  Lymph: No lymphadenopathy of posterior or anterior cervical chain or axillae bilaterally.  Gait antalgic gait MSK:  Non tender with full range of motion and good stability and symmetric strength and tone of shoulders, elbows, wrist, hip, and ankles bilaterally.  Knee: Bilateral Varus deformity noted. Large thigh to calf ratio.  Tender to palpation over medial and PF joint line.  ROM full in flexion and extension and lower leg rotation. instability with valgus force.  painful patellar compression. Patellar glide with moderate crepitus. Patellar and quadriceps tendons unremarkable. Hamstring and quadriceps strength is normal.  After informed written and verbal consent, patient was seated on exam table. Right knee was prepped with alcohol swab and utilizing anterolateral approach, patient's right knee space was injected with 4:1  marcaine 0.5%: Kenalog 40mg /dL. Patient tolerated the procedure well without immediate complications.  After informed written and verbal consent, patient was seated on exam table. Left knee was prepped with alcohol swab and utilizing anterolateral approach, patient's left knee space was injected with 4:1  marcaine 0.5%: Kenalog 40mg /dL. Patient tolerated the procedure well without immediate complications.     Impression and Recommendations:     This case required medical decision making  of moderate complexity.      Note: This dictation was prepared with Dragon dictation along with smaller phrase technology. Any transcriptional errors that result from this process are unintentional.

## 2017-06-20 ENCOUNTER — Other Ambulatory Visit: Payer: Self-pay

## 2017-06-20 ENCOUNTER — Encounter: Payer: Self-pay | Admitting: Family Medicine

## 2017-06-20 ENCOUNTER — Ambulatory Visit: Payer: PRIVATE HEALTH INSURANCE | Admitting: Family Medicine

## 2017-06-20 DIAGNOSIS — M170 Bilateral primary osteoarthritis of knee: Secondary | ICD-10-CM

## 2017-06-20 MED ORDER — DICLOFENAC SODIUM 2 % TD SOLN: 2.0000 g | Freq: Two times a day (BID) | TRANSDERMAL | 3 refills | Status: DC

## 2017-06-20 NOTE — Assessment & Plan Note (Signed)
Bilateral injection given today.  Tolerated the procedure well.  We discussed icing regimen and home exercise.  We discussed which activities to do which wants to avoid.  Patient would be a candidate for Visco supplementation.  Will check with insurance company if it is covered.  Patient will follow up with me again in 4 weeks.

## 2017-06-20 NOTE — Patient Instructions (Signed)
Good to see you  Lets hope this calms it down Ice 20 minutes 2 times daily. Usually after activity and before bed. pennsaid pinkie amount topically 2 times daily as needed.  I think you would benefit from viscosupplementation. I would ask you to check with your insurance on coverage. Information they will need includes Diagnosis code- M17.0, M17.2 CPT codes:    Synvisc J7325   monovisc K2099   Orthovisc A6893 See which one is covered then call us at [phone] and we will schedule you  See me again in 4 weeks Happy holidays!  [phone]

## 2017-06-24 ENCOUNTER — Encounter: Payer: Self-pay | Admitting: Family Medicine

## 2017-06-24 ENCOUNTER — Encounter: Payer: PRIVATE HEALTH INSURANCE | Admitting: Internal Medicine

## 2017-07-19 NOTE — Progress Notes (Signed)
[facility] [name], [name] Phone: [phone] Subjective:     CC: Knee pain follow-up  Charles Trujillo  Charles Trujillo is a 67 y.o. male coming in with complaint of found to have severe arthritis of the knees bilaterally.  Failed conservative therapy.  Avoiding surgical intervention at this time.  Had steroid injections of the knees 4 weeks ago.  Patient states did have some improvement initially but now having worsening symptoms of these.  Has done formal physical therapy and all other conservative therapy.      Past Medical History:  Diagnosis Date  . Anxiety   . Arthritis    bilateral knees  . Depression   . DM2 (diabetes mellitus, type 2) ([facility])   . Dysrhythmia     benign tachycardia, takes [place]  . Elevated LFTs    previously evaluated by Dr Charles Trujillo; h/o "fatty liver"  . HLD (hyperlipidemia)   . Hypertension   . Inguinal hernia    right  . Squamous cell carcinoma    top of head   Past Surgical History:  Procedure Laterality Date  . ACHILLES TENDON REPAIR  1999  . CARPAL TUNNEL RELEASE Right 11/04/2014   Procedure: LIMITED OPEN RIGHT CARPAL TUNNEL RELEASE;  Surgeon: Charles Kaufman, MD;  Location: [facility];  Service: Orthopedics;  Laterality: Right;  . CARPAL TUNNEL WITH CUBITAL TUNNEL Left 07/22/2014   Procedure: LEFT CARPAL TUNNEL RELEASE AND CUBITAL TUNNEL RELEASE TRANSPOSITION;  Surgeon: Charles Kaufman, MD;  Location: [facility];  Service: Orthopedics;  Laterality: Left;  . HERNIA REPAIR  07/2011   left side  . INGUINAL HERNIA REPAIR  08/09/2011   Procedure: LAPAROSCOPIC INGUINAL HERNIA;  Surgeon: Charles Faster. Cornett, MD;  Location[facility];  Service: General;  Laterality: Right;  . KNEE ARTHROSCOPY  1993   left knee  . KNEE SURGERY     age 27- right  . SQUAMOUS CELL CARCINOMA EXCISION     left knee   Social History   Socioeconomic History  . Marital status: Married   Spouse name: None  . Number of children: 1  . Years of education: None  . Highest education level: None  Social Needs  . Financial resource strain: None  . Food insecurity - worry: None  . Food insecurity - inability: None  . Transportation needs - medical: None  . Transportation needs - non-medical: None  Occupational History  . 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 Smoking status: Current Some Day Smoker    Types: Cigars  . Smokeless tobacco: Never Used  . Tobacco comment: smokes one every other day  Substance and Sexual Activity  . Alcohol use: Yes    Alcohol/week: 1.8 - 2.4 oz    Types: 3 - 4 Cans of beer per week    Comment: socia;  . Drug use: No  . Sexual activity: Yes    Partners: Female  Other Topics Concern  . None  Social History Narrative   Exercise--- not regularly   Allergies  Allergen Reactions  . Codeine Nausea Only  . Codeine Phosphate     REACTION: nausea   Family History  Problem Relation Age of Onset  . Diabetes Mother   . Hypertension Mother   . Heart disease Mother   . Heart failure Mother   . Stroke Father 63  . Parkinsonism Father   . Depression Brother   . Alcohol abuse Brother   .  Cancer Maternal Grandmother        bone     Past medical history, social, surgical and family history all reviewed in electronic medical record.  No pertanent information unless stated regarding to the chief complaint.   Review of Systems:Review of systems updated and as accurate as of 07/21/17  No headache, visual changes, nausea, vomiting, diarrhea, constipation, dizziness, abdominal pain, skin rash, fevers, chills, night sweats, weight loss, swollen lymph nodes, body aches, joint swelling, chest pain, shortness of breath, mood changes.  Positive muscle aches  Objective  Blood pressure 120/74, pulse 85, height 5\' 7"  (1.702 m), weight 202 lb (91.6 kg), SpO2 95 %. Systems examined below as of 07/21/17   General: No apparent  distress alert and oriented x3 mood and affect normal, dressed appropriately.  HEENT: Pupils equal, extraocular movements intact  Respiratory: Patient's speak in full sentences and does not appear short of breath  Cardiovascular: No lower extremity edema, non tender, no erythema  Skin: Warm dry intact with no signs of infection or rash on extremities or on axial skeleton.  Abdomen: Soft nontender  Neuro: Cranial nerves II through XII are intact, neurovascularly intact in all extremities with 2+ DTRs and 2+ pulses.  Lymph: No lymphadenopathy of posterior or anterior cervical chain or axillae bilaterally.  Gait normal with good balance and coordination.  MSK:  Non tender with full range of motion and good stability and symmetric strength and tone of shoulders, elbows, wrist, hip, and ankles bilaterally.  Knee: Bilateral valgus deformity noted. Large thigh to calf ratio.  Tender to palpation over medial and PF joint line.  ROM full in flexion and extension and lower leg rotation. instability with valgus force.  painful patellar compression. Patellar glide with moderate crepitus. Patellar and quadriceps tendons unremarkable. Hamstring and quadriceps strength is normal.   After informed written and verbal consent, patient was seated on exam table. Right knee was prepped with alcohol swab and utilizing anterolateral approach, patient's right knee space was injected with 16 mg/2.5 mL of Synvisc (sodium hyaluronate) in a prefilled syringe was injected easily into the knee through a 22-gauge needle.  Patient tolerated the procedure well without immediate complications.  After informed written and verbal consent, patient was seated on exam table. Left knee was prepped with alcohol swab and utilizing anterolateral approach, patient's left knee space was injected with 16 mg/2.5 mL of Synvisc (sodium hyaluronate) in a prefilled syringe was injected easily into the knee through a 22-gauge needle.. Patient  tolerated the procedure well without immediate complications.   Impression and Recommendations:     This case required medical decision making of moderate complexity.      Note: This dictation was prepared with Dragon dictation along with smaller phrase technology. Any transcriptional errors that result from this process are unintentional.

## 2017-07-21 ENCOUNTER — Encounter: Payer: Self-pay | Admitting: Family Medicine

## 2017-07-21 ENCOUNTER — Ambulatory Visit: Payer: PRIVATE HEALTH INSURANCE | Admitting: Family Medicine

## 2017-07-21 DIAGNOSIS — M170 Bilateral primary osteoarthritis of knee: Secondary | ICD-10-CM

## 2017-07-21 NOTE — Assessment & Plan Note (Addendum)
Patient does have degenerative arthritis bilaterally.  We discussed icing regimen and home exercises.  We discussed which activities of doing which wants to avoid.  Patient will start on Visco supplementation after failing all conservative therapy.  Follow-up again in 1 week in a series of 3 injections.

## 2017-07-23 ENCOUNTER — Other Ambulatory Visit: Payer: Self-pay | Admitting: Family Medicine

## 2017-07-27 NOTE — Progress Notes (Signed)
[name] [place],  [address] Phone: [phone] Subjective:     CC: Knee pain follow-up  DTO:[id number]  Charles Trujillo is a 67 y.o. male coming in with complaint of bilateral knee pain.  Found to have severe osteoarthritic changes.  Failed all conservative therapy.  Here for second in a series of 3 injections for Visco supplementation.  Patient states he seen improvement already. No new symptoms.     Past Medical History:  Diagnosis Date  . Anxiety   . Arthritis    bilateral knees  . Depression   . DM2 (diabetes mellitus, type 2) ([facility])   . Dysrhythmia     benign tachycardia, takes [place]  . Elevated LFTs    previously evaluated by Dr Sharlett Iles; h/o "fatty liver"  . HLD (hyperlipidemia)   . Hypertension   . Inguinal hernia    right  . Squamous cell carcinoma    top of head   Past Surgical History:  Procedure Laterality Date  . ACHILLES TENDON REPAIR  1999  . CARPAL TUNNEL RELEASE Right 11/04/2014   Procedure: LIMITED OPEN RIGHT CARPAL TUNNEL RELEASE;  Surgeon: Roseanne Kaufman, MD;  Location: [facility];  Service: Orthopedics;  Laterality: Right;  . CARPAL TUNNEL WITH CUBITAL TUNNEL Left 07/22/2014   Procedure: LEFT CARPAL TUNNEL RELEASE AND CUBITAL TUNNEL RELEASE TRANSPOSITION;  Surgeon: Roseanne Kaufman, MD;  Location: [facility];  Service: Orthopedics;  Laterality: Left;  . HERNIA REPAIR  07/2011   left side  . INGUINAL HERNIA REPAIR  08/09/2011   Procedure: LAPAROSCOPIC INGUINAL HERNIA;  Surgeon: Joyice Faster. Cornett, MD;  Location[facility];  Service: General;  Laterality: Right;  . KNEE ARTHROSCOPY  1993   left knee  . KNEE SURGERY     age 57- right  . SQUAMOUS CELL CARCINOMA EXCISION     left knee   Social History   Socioeconomic History  . Marital status: Married    Spouse name: None  . Number of children: 1  . Years of education: None  . Highest education level: None  Social  Needs  . Financial resource strain: None  . Food insecurity - worry: None  . Food insecurity - inability: None  . Transportation needs - medical: None  . Transportation needs - non-medical: None  Occupational History  . 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 Smoking status: Current Some Day Smoker    Types: Cigars  . Smokeless tobacco: Never Used  . Tobacco comment: smokes one every other day  Substance and Sexual Activity  . Alcohol use: Yes    Alcohol/week: 1.8 - 2.4 oz    Types: 3 - 4 Cans of beer per week    Comment: socia;  . Drug use: No  . Sexual activity: Yes    Partners: Female  Other Topics Concern  . None  Social History Narrative   Exercise--- not regularly   Allergies  Allergen Reactions  . Codeine Nausea Only  . Codeine Phosphate     REACTION: nausea   Family History  Problem Relation Age of Onset  . Diabetes Mother   . Hypertension Mother   . Heart disease Mother   . Heart failure Mother   . Stroke Father 97  . Parkinsonism Father   . Depression Brother   . Alcohol abuse Brother   . Cancer Maternal Grandmother        bone  Past medical history, social, surgical and family history all reviewed in electronic medical record.  No pertanent information unless stated regarding to the chief complaint.   Review of Systems:Review of systems updated and as accurate as of 07/28/17  No headache, visual changes, nausea, vomiting, diarrhea, constipation, dizziness, abdominal pain, skin rash, fevers, chills, night sweats, weight loss, swollen lymph nodes, body aches, joint swelling, muscle aches, chest pain, shortness of breath, mood changes.   Objective  Blood pressure 124/90, pulse 97, height 5\' 7"  (1.702 m), weight 203 lb (92.1 kg), SpO2 92 %. Systems examined below as of 07/28/17   General: No apparent distress alert and oriented x3 mood and affect normal, dressed appropriately.  HEENT: Pupils equal, extraocular movements  intact  Respiratory: Patient's speak in full sentences and does not appear short of breath  Cardiovascular: No lower extremity edema, non tender, no erythema  Skin: Warm dry intact with no signs of infection or rash on extremities or on axial skeleton.  Abdomen: Soft nontender  Neuro: Cranial nerves II through XII are intact, neurovascularly intact in all extremities with 2+ DTRs and 2+ pulses.  Lymph: No lymphadenopathy of posterior or anterior cervical chain or axillae bilaterally.  Gait normal with good balance and coordination.  MSK:  Non tender with full range of motion and good stability and symmetric strength and tone of shoulders, elbows, wrist, hip, and ankles bilaterally.  Knee: Bilateral valgus deformity noted. Large thigh to calf ratio.  Tender to palpation over medial and PF joint line.  ROM full in flexion and extension and lower leg rotation. instability with valgus force.  painful patellar compression. Patellar glide with moderate crepitus. Patellar and quadriceps tendons unremarkable. Hamstring and quadriceps strength is normal.  After informed written and verbal consent, patient was seated on exam table. Right knee was prepped with alcohol swab and utilizing anterolateral approach, patient's right knee space was injected with 16 mg/2.5 mL of Synvisc (sodium hyaluronate) in a prefilled syringe was injected easily into the knee through a 22-gauge needle.. Patient tolerated the procedure well without immediate complications.  After informed written and verbal consent, patient was seated on exam table. Left knee was prepped with alcohol swab and utilizing anterolateral approach, patient's left knee space was injected with 16 mg/2.5 mL of Synvisc (sodium hyaluronate) in a prefilled syringe was injected easily into the knee through a 22-gauge needle.. Patient tolerated the procedure well without immediate complications.   Impression and Recommendations:     This case required  medical decision making of moderate complexity.      Note: This dictation was prepared with Dragon dictation along with smaller phrase technology. Any transcriptional errors that result from this process are unintentional.

## 2017-07-28 ENCOUNTER — Encounter: Payer: Self-pay | Admitting: Family Medicine

## 2017-07-28 ENCOUNTER — Ambulatory Visit: Payer: PRIVATE HEALTH INSURANCE | Admitting: Family Medicine

## 2017-07-28 ENCOUNTER — Other Ambulatory Visit: Payer: Self-pay | Admitting: Family Medicine

## 2017-07-28 DIAGNOSIS — M170 Bilateral primary osteoarthritis of knee: Secondary | ICD-10-CM

## 2017-07-28 NOTE — Patient Instructions (Signed)
Good to see you as always You know the drill  Keep it up  See you soon.

## 2017-07-28 NOTE — Assessment & Plan Note (Signed)
Patient given second in a series of 3 injections for Visco supplementation.  We discussed icing regimen and home exercises.  We discussed which activities to do which wants to avoid.  Continue home medications.  Follow-up with me again in 1 week for third and final injection

## 2017-08-03 NOTE — Progress Notes (Signed)
[name] [place], [place] [address] Phone: [phone] Subjective:    I'm seeing this patient by the request  of:    CC: Bilateral knee pain follow-up  EQA:[id number]  Charles Trujillo is a 67 y.o. male coming in with complaint of bilateral knee pain.  Found to have severe osteoarthritic changes.  Failed all conservative therapy.  Patient is having some pain.  Has started Visco supplementation.  Here for third and final injections of the knees bilaterally.  Patient states overall he has noted some improvement.  Patient seems not as much pain as before.  Daily activities seem to be more easy.     Past Medical History:  Diagnosis Date  . Anxiety   . Arthritis    bilateral knees  . Depression   . DM2 (diabetes mellitus, type 2) ([place])   . Dysrhythmia     benign tachycardia, takes [place]  . Elevated LFTs    previously evaluated by Dr Sharlett Iles; h/o "fatty liver"  . HLD (hyperlipidemia)   . Hypertension   . Inguinal hernia    right  . Squamous cell carcinoma    top of head   Past Surgical History:  Procedure Laterality Date  . ACHILLES TENDON REPAIR  1999  . CARPAL TUNNEL RELEASE Right 11/04/2014   Procedure: LIMITED OPEN RIGHT CARPAL TUNNEL RELEASE;  Surgeon: Roseanne Kaufman, MD;  Location: [facility];  Service: Orthopedics;  Laterality: Right;  . CARPAL TUNNEL WITH CUBITAL TUNNEL Left 07/22/2014   Procedure: LEFT CARPAL TUNNEL RELEASE AND CUBITAL TUNNEL RELEASE TRANSPOSITION;  Surgeon: Roseanne Kaufman, MD;  Location: [facility];  Service: Orthopedics;  Laterality: Left;  . HERNIA REPAIR  07/2011   left side  . INGUINAL HERNIA REPAIR  08/09/2011   Procedure: LAPAROSCOPIC INGUINAL HERNIA;  Surgeon: Joyice Faster. Cornett, MD;  Location: [facility];  Service: General;  Laterality: Right;  . KNEE ARTHROSCOPY  1993   left knee  . KNEE SURGERY     age 52- right  . SQUAMOUS CELL CARCINOMA EXCISION     left knee    Social History   Socioeconomic History  . Marital status: Married    Spouse name: None  . Number of children: 1  . Years of education: None  . Highest education level: None  Social Needs  . Financial resource strain: None  . Food insecurity - worry: None  . Food insecurity - inability: None  . Transportation needs - medical: None  . Transportation needs - non-medical: None  Occupational History  . 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 Smoking status: Current Some Day Smoker    Types: Cigars  . Smokeless tobacco: Never Used  . Tobacco comment: smokes one every other day  Substance and Sexual Activity  . Alcohol use: Yes    Alcohol/week: 1.8 - 2.4 oz    Types: 3 - 4 Cans of beer per week    Comment: socia;  . Drug use: No  . Sexual activity: Yes    Partners: Female  Other Topics Concern  . None  Social History Narrative   Exercise--- not regularly   Allergies  Allergen Reactions  . Codeine Nausea Only  . Codeine Phosphate     REACTION: nausea   Family History  Problem Relation Age of Onset  . Diabetes Mother   . Hypertension Mother   . Heart disease Mother   . Heart failure Mother   .  Stroke Father 84  . Parkinsonism Father   . Depression Brother   . Alcohol abuse Brother   . Cancer Maternal Grandmother        bone     Past medical history, social, surgical and family history all reviewed in electronic medical record.  No pertanent information unless stated regarding to the chief complaint.   Review of Systems:Review of systems updated and as accurate as of 08/04/17  No headache, visual changes, nausea, vomiting, diarrhea, constipation, dizziness, abdominal pain, skin rash, fevers, chills, night sweats, weight loss, swollen lymph nodes, body aches, joint swelling,  chest pain, shortness of breath, mood changes.  Positive muscle aches  Objective  Blood pressure 132/74, pulse 80, height 5\' 7"  (1.702 m), weight 205 lb (93  kg), SpO2 97 %. Systems examined below as of 08/04/17   General: No apparent distress alert and oriented x3 mood and affect normal, dressed appropriately.  HEENT: Pupils equal, extraocular movements intact  Respiratory: Patient's speak in full sentences and does not appear short of breath  Cardiovascular: No lower extremity edema, non tender, no erythema  Skin: Warm dry intact with no signs of infection or rash on extremities or on axial skeleton.  Abdomen: Soft nontender  Neuro: Cranial nerves II through XII are intact, neurovascularly intact in all extremities with 2+ DTRs and 2+ pulses.  Lymph: No lymphadenopathy of posterior or anterior cervical chain or axillae bilaterally.  Gait normal with good balance and coordination.  MSK:  Non tender with full range of motion and good stability and symmetric strength and tone of shoulders, elbows, wrist, hip and ankles bilaterally.  Knee: Bilateral valgus deformity noted. Large thigh to calf ratio.  Tender to palpation over medial and PF joint line.  ROM full in flexion and extension and lower leg rotation. instability with valgus force.  painful patellar compression. Patellar glide with moderate crepitus. Patellar and quadriceps tendons unremarkable. Hamstring and quadriceps strength is normal.  After informed written and verbal consent, patient was seated on exam table. Right knee was prepped with alcohol swab and utilizing anterolateral approach, patient's right knee space was injected with 16 mg/2.5 mL of Synvisc (sodium hyaluronate) in a prefilled syringe was injected easily into the knee through a 22-gauge needle.. Patient tolerated the procedure well without immediate complications.  After informed written and verbal consent, patient was seated on exam table. Left knee was prepped with alcohol swab and utilizing anterolateral approach, patient's left knee space was injected with 16 mg/2.5 mL of Synvisc (sodium hyaluronate) in a prefilled  syringe was injected easily into the knee through a 22-gauge needle.. Patient tolerated the procedure well without immediate complications.    Impression and Recommendations:     This case required medical decision making of moderate complexity.      Note: This dictation was prepared with Dragon dictation along with smaller phrase technology. Any transcriptional errors that result from this process are unintentional.

## 2017-08-04 ENCOUNTER — Encounter: Payer: Self-pay | Admitting: Family Medicine

## 2017-08-04 ENCOUNTER — Ambulatory Visit: Payer: PRIVATE HEALTH INSURANCE | Admitting: Family Medicine

## 2017-08-04 DIAGNOSIS — M170 Bilateral primary osteoarthritis of knee: Secondary | ICD-10-CM | POA: Diagnosis not present

## 2017-08-04 NOTE — Patient Instructions (Signed)
You are done! Hope this does the trick  You know where I am if you need me.  Maybe have an appointment in 4 weeks just in case

## 2017-08-04 NOTE — Assessment & Plan Note (Signed)
Third and final Visco supplementation injection given today.  We discussed icing regimen and home exercises.  Patient has a custom brace that he will not wear intermittently.  Follow-up with me as needed

## 2017-10-08 ENCOUNTER — Other Ambulatory Visit: Payer: Self-pay | Admitting: Family Medicine

## 2017-10-28 ENCOUNTER — Other Ambulatory Visit: Payer: Self-pay | Admitting: Family Medicine

## 2017-11-04 ENCOUNTER — Other Ambulatory Visit: Payer: Self-pay | Admitting: Family Medicine

## 2017-11-14 ENCOUNTER — Other Ambulatory Visit: Payer: Self-pay | Admitting: Family Medicine

## 2017-11-14 DIAGNOSIS — I10 Essential (primary) hypertension: Secondary | ICD-10-CM

## 2017-12-04 ENCOUNTER — Other Ambulatory Visit: Payer: Self-pay | Admitting: Family Medicine

## 2017-12-04 DIAGNOSIS — I10 Essential (primary) hypertension: Secondary | ICD-10-CM

## 2017-12-09 ENCOUNTER — Ambulatory Visit: Payer: PRIVATE HEALTH INSURANCE | Admitting: Family Medicine

## 2017-12-09 ENCOUNTER — Encounter: Payer: Self-pay | Admitting: Family Medicine

## 2017-12-09 VITALS — BP 100/56 | HR 80 | Temp 97.8°F | Resp 16 | Ht 67.0 in | Wt 201.4 lb

## 2017-12-09 DIAGNOSIS — E1151 Type 2 diabetes mellitus with diabetic peripheral angiopathy without gangrene: Secondary | ICD-10-CM

## 2017-12-09 DIAGNOSIS — R319 Hematuria, unspecified: Secondary | ICD-10-CM

## 2017-12-09 DIAGNOSIS — E1165 Type 2 diabetes mellitus with hyperglycemia: Secondary | ICD-10-CM | POA: Diagnosis not present

## 2017-12-09 DIAGNOSIS — R809 Proteinuria, unspecified: Secondary | ICD-10-CM | POA: Diagnosis not present

## 2017-12-09 DIAGNOSIS — I10 Essential (primary) hypertension: Secondary | ICD-10-CM | POA: Diagnosis not present

## 2017-12-09 DIAGNOSIS — R5383 Other fatigue: Secondary | ICD-10-CM

## 2017-12-09 DIAGNOSIS — E785 Hyperlipidemia, unspecified: Secondary | ICD-10-CM

## 2017-12-09 DIAGNOSIS — R8290 Unspecified abnormal findings in urine: Secondary | ICD-10-CM | POA: Diagnosis not present

## 2017-12-09 LAB — POC URINALSYSI DIPSTICK (AUTOMATED)
BILIRUBIN UA: NEGATIVE
Glucose, UA: POSITIVE — AB
KETONES UA: NEGATIVE
Leukocytes, UA: NEGATIVE
Nitrite, UA: NEGATIVE
PH UA: 5 (ref 5.0–8.0)
PROTEIN UA: POSITIVE — AB
SPEC GRAV UA: 1.025 (ref 1.010–1.025)
Urobilinogen, UA: 0.2 E.U./dL

## 2017-12-09 NOTE — Progress Notes (Signed)
Patient ID: Charles Trujillo, male   DOB: Dec 21, 1950, 67 y.o.   MRN: [id number]     Subjective:  I acted as a Education administrator for Dr. Carollee Herter.  Charles Trujillo, [place]   Patient ID: Charles Trujillo, male    DOB: 05/28/1951, 67 y.o.   MRN: [id number]  Chief Complaint  Patient presents with  . Hypertension  . Hyperlipidemia  . Diabetes    HPI  Patient is in today for follow up blood pressure, cholesterol, and diabetes. He c/o lightheadedness and fatigue-- worse then usually.  He is drinking enough in this heat.  He has not checked his glucose when this occurs.    HPI HYPERTENSION   Blood pressure range-not checking   Chest pain- no    Dyspnea- no Lightheadedness- no   Edema- no  Other side effects - no   Medication compliance: good Low salt diet- yes   DIABETES    Blood Sugar ranges-good per pt  Polyuria- no New Visual problems- no  Hypoglycemic symptoms- no  Other side effects fatigue/ lightheaded Medication compliance - good    HYPERLIPIDEMIA  Medication compliance- good RUQ pain- no  Muscle aches- no Other side effects-no    Patient Care Team: [name], DO as PCP - General Melissa Noon, OD as Referring Physician (Optometry) Jari Pigg, MD as Consulting Physician (Dermatology) Rana Snare, MD as Consulting Physician (Urology) Roseanne Kaufman, MD as Consulting Physician (Orthopedic Surgery)   Past Medical History:  Diagnosis Date  . Anxiety   . Arthritis    bilateral knees  . Depression   . DM2 (diabetes mellitus, type 2) ([name])   . Dysrhythmia     benign tachycardia, takes [place]  . Elevated LFTs    previously evaluated by Dr Sharlett Iles; h/o "fatty liver"  . HLD (hyperlipidemia)   . Hypertension   . Inguinal hernia    right  . Squamous cell carcinoma    top of head    Past Surgical History:  Procedure Laterality Date  . ACHILLES TENDON REPAIR  1999  . CARPAL TUNNEL RELEASE Right 11/04/2014   Procedure: LIMITED OPEN RIGHT CARPAL TUNNEL  RELEASE;  Surgeon: Roseanne Kaufman, MD;  Location: [facility];  Service: Orthopedics;  Laterality: Right;  . CARPAL TUNNEL WITH CUBITAL TUNNEL Left 07/22/2014   Procedure: LEFT CARPAL TUNNEL RELEASE AND CUBITAL TUNNEL RELEASE TRANSPOSITION;  Surgeon: Roseanne Kaufman, MD;  Location: [facility];  Service: Orthopedics;  Laterality: Left;  . HERNIA REPAIR  07/2011   left side  . INGUINAL HERNIA REPAIR  08/09/2011   Procedure: LAPAROSCOPIC INGUINAL HERNIA;  Surgeon: Joyice Faster. Cornett, MD;  Location: [facility];  Service: General;  Laterality: Right;  . KNEE ARTHROSCOPY  1993   left knee  . KNEE SURGERY     age 65- right  . SQUAMOUS CELL CARCINOMA EXCISION     left knee    Family History  Problem Relation Age of Onset  . Diabetes Mother   . Hypertension Mother   . Heart disease Mother   . Heart failure Mother   . Stroke Father 27  . Parkinsonism Father   . Depression Brother   . Alcohol abuse Brother   . Cancer Maternal Grandmother        bone    Social History   Socioeconomic History  . Marital status: Married    Spouse name: Not on file  . Number of children: 1  . Years of education: Not on file  .  Highest education level: Not on file  Occupational History  . 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 Financial resource strain: Not on file  . Food insecurity:    Worry: Not on file    Inability: Not on file  . Transportation needs:    Medical: Not on file    Non-medical: Not on file  Tobacco Use  . Smoking status: Current Some Day Smoker    Types: Cigars  . Smokeless tobacco: Never Used  . Tobacco comment: smokes one every other day  Substance and Sexual Activity  . Alcohol use: Yes    Alcohol/week: 1.8 - 2.4 oz    Types: 3 - 4 Cans of beer per week    Comment: socia;  . Drug use: No  . Sexual activity: Yes    Partners: Female  Lifestyle  . Physical activity:    Days per week: Not on file    Minutes per  session: Not on file  . Stress: Not on file  Relationships  . Social connections:    Talks on phone: Not on file    Gets together: Not on file    Attends religious service: Not on file    Active member of club or organization: Not on file    Attends meetings of clubs or organizations: Not on file    Relationship status: Not on file  . Intimate partner violence:    Fear of current or ex partner: Not on file    Emotionally abused: Not on file    Physically abused: Not on file    Forced sexual activity: Not on file  Other Topics Concern  . Not on file  Social History Narrative   Exercise--- not regularly    Outpatient Medications Prior to Visit  Medication Sig Dispense Refill  . atorvastatin (LIPITOR) 20 MG tablet TAKE 1 TABLET BY MOUTH DAILY. 90 tablet 2  . bisacodyl (DULCOLAX) 5 MG EC tablet Take 5 mg by mouth. Dulcolax 5 mg bowel prep #4-Take as directed    . CARTIA XT 120 MG 24 hr capsule TAKE 1 CAPSULE DAILY BY MOUTH. 90 capsule 1  . celecoxib (CELEBREX) 200 MG capsule TAKE 1 CAPSULE BY MOUTH DAILY 90 capsule 0  . dapagliflozin propanediol (FARXIGA) 10 MG TABS tablet Take 10 mg by mouth daily. 90 tablet 3  . Diclofenac Sodium (PENNSAID) 2 % SOLN Place 2 g onto the skin 2 (two) times daily. 112 g 3  . DULoxetine (CYMBALTA) 60 MG capsule TAKE ONE CAPSULE BY MOUTH ONCE DAILY 90 capsule 3  . glucose blood (ONE TOUCH TEST STRIPS) test strip Check blood sugar twice daily 100 each 11  . HYDROmorphone (DILAUDID) 2 MG tablet Take 2 tablets (4 mg total) by mouth every 4 (four) hours as needed for moderate pain or severe pain. 45 tablet 0  . ibuprofen (ADVIL,MOTRIN) 200 MG tablet Take 400 mg by mouth as needed. Pain     . JANUMET XR [phone] MG TB24 TAKE 1 TABLET BY MOUTH DAILY. 90 tablet 1  . lisinopril-hydrochlorothiazide (PRINZIDE,ZESTORETIC) 20-12.5 MG tablet TAKE 1 TABLET BY MOUTH DAILY. 30 tablet 0  . metoprolol (LOPRESSOR) 50 MG tablet Take 1 tablet by mouth 2 (two) times daily.      [name] DELICA LANCETS FINE MISC Use qd 100 each 3  . Polyethylene Glycol 3350 (MIRALAX PO) Take by mouth. Miralax bowel prep 238 gm-Take as directed    . sildenafil (VIAGRA) 100 MG tablet Take 1  tablet (100 mg total) by mouth as needed for erectile dysfunction. Take 100 mg by mouth daily as needed for Erectile Dysfunction. 10 tablet 2  . meclizine (ANTIVERT) 12.5 MG tablet Take 1 tablet (12.5 mg total) by mouth 3 (three) times daily as needed for dizziness. 30 tablet 0  . Mirabegron (MYRBETRIQ PO) Take by mouth.     No facility-administered medications prior to visit.     Allergies  Allergen Reactions  . Codeine Nausea Only  . Codeine Phosphate     REACTION: nausea    Review of Systems  Constitutional: Positive for malaise/fatigue. Negative for chills and fever.  HENT: Negative for congestion and hearing loss.   Eyes: Negative for discharge.  Respiratory: Negative for cough, sputum production and shortness of breath.   Cardiovascular: Negative for chest pain, palpitations and leg swelling.  Gastrointestinal: Negative for abdominal pain, blood in stool, constipation, diarrhea, heartburn, nausea and vomiting.  Genitourinary: Negative for dysuria, frequency, hematuria and urgency.  Musculoskeletal: Negative for back pain, falls and myalgias.  Skin: Negative for rash.  Neurological: Positive for dizziness. Negative for sensory change, loss of consciousness, weakness and headaches.  Endo/Heme/Allergies: Negative for environmental allergies. Does not bruise/bleed easily.  Psychiatric/Behavioral: Negative for depression and suicidal ideas. The patient is not nervous/anxious and does not have insomnia.        Objective:    Physical Exam  Constitutional: He is oriented to person, place, and time. Vital signs are normal. He appears well-developed and well-nourished. He is sleeping.  HENT:  Head: Normocephalic and atraumatic.  Mouth/Throat: Oropharynx is clear and moist.  Eyes:  Pupils are equal, round, and reactive to light. EOM are normal.  Neck: Normal range of motion. Neck supple. No thyromegaly present.  Cardiovascular: Normal rate and regular rhythm.  No murmur heard. Pulmonary/Chest: Effort normal and breath sounds normal. No respiratory distress. He has no wheezes. He has no rales. He exhibits no tenderness.  Musculoskeletal: He exhibits no edema or tenderness.  Neurological: He is alert and oriented to person, place, and time.  Skin: Skin is warm and dry.  Psychiatric: He has a normal mood and affect. His behavior is normal. Judgment and thought content normal.  Nursing note and vitals reviewed.   BP (!) 100/56 (BP Location: Left Arm, Cuff Size: Normal)   Pulse 80   Temp 97.8 F (36.6 C) (Oral)   Resp 16   Ht 5\' 7"  (1.702 m)   Wt 201 lb 6.4 oz (91.4 kg)   SpO2 96%   BMI 31.54 kg/m  Wt Readings from Last 3 Encounters:  12/09/17 201 lb 6.4 oz (91.4 kg)  08/04/17 205 lb (93 kg)  07/28/17 203 lb (92.1 kg)   BP Readings from Last 3 Encounters:  12/09/17 (!) 100/56  08/04/17 132/74  07/28/17 124/90     Immunization History  Administered Date(s) Administered  . Influenza Whole 05/31/2009, 04/09/2012  . Influenza, High Dose Seasonal PF 04/18/2017  . Influenza,inj,Quad PF,6+ Mos 04/19/2013  . Influenza-Unspecified 04/15/2014, 04/14/2015, 04/19/2016  . Pneumococcal Conjugate-13 11/01/2014  . Pneumococcal Polysaccharide-23 03/25/2017  . Td 06/21/2009  . Tdap 01/20/2008  . Zoster 02/01/2011    Health Maintenance  Topic Date Due  . COLONOSCOPY  07/15/2017  . HEMOGLOBIN A1C  09/22/2017  . INFLUENZA VACCINE  02/12/2018  . FOOT EXAM  03/25/2018  . OPHTHALMOLOGY EXAM  05/06/2018  . TETANUS/TDAP  06/22/2019  . Hepatitis C Screening  Completed  . PNA vac Low Risk Adult  Completed  Lab Results  Component Value Date   WBC 9.3 03/28/2016   HGB 15.4 03/28/2016   HCT 44.6 03/28/2016   PLT 252.0 03/28/2016   GLUCOSE 158 (H) 03/25/2017   CHOL  137 03/25/2017   TRIG 53.0 03/25/2017   HDL 61.60 03/25/2017   LDLDIRECT 143.5 02/01/2011   LDLCALC 65 03/25/2017   ALT 39 03/25/2017   AST 33 03/25/2017   NA 138 03/25/2017   K 4.8 03/25/2017   CL 102 03/25/2017   CREATININE 1.19 03/25/2017   BUN 20 03/25/2017   CO2 29 03/25/2017   TSH 2.950 03/28/2015   PSA 0.33 04/03/2012   HGBA1C 7.3 (H) 03/25/2017   MICROALBUR 1.0 11/01/2014    Lab Results  Component Value Date   TSH 2.950 03/28/2015   Lab Results  Component Value Date   WBC 9.3 03/28/2016   HGB 15.4 03/28/2016   HCT 44.6 03/28/2016   MCV 91.0 03/28/2016   PLT 252.0 03/28/2016   Lab Results  Component Value Date   NA 138 03/25/2017   K 4.8 03/25/2017   CO2 29 03/25/2017   GLUCOSE 158 (H) 03/25/2017   BUN 20 03/25/2017   CREATININE 1.19 03/25/2017   BILITOT 0.6 03/25/2017   ALKPHOS 40 03/25/2017   AST 33 03/25/2017   ALT 39 03/25/2017   PROT 6.6 03/25/2017   ALBUMIN 4.3 03/25/2017   CALCIUM 9.5 03/25/2017   ANIONGAP 10 07/19/2014   GFR 64.66 03/25/2017   Lab Results  Component Value Date   CHOL 137 03/25/2017   Lab Results  Component Value Date   HDL 61.60 03/25/2017   Lab Results  Component Value Date   LDLCALC 65 03/25/2017   Lab Results  Component Value Date   TRIG 53.0 03/25/2017   Lab Results  Component Value Date   CHOLHDL 2 03/25/2017   Lab Results  Component Value Date   HGBA1C 7.3 (H) 03/25/2017         Assessment & Plan:   Problem List Items Addressed This Visit      Unprioritized   Essential hypertension    Running low--  Cut lisinopril in half  If still running low consider stopping Recheck 2-3 weeks       Relevant Orders   Comprehensive metabolic panel   Lipid panel   CBC with Differential/Platelet   POCT Urinalysis Dipstick (Automated) (Completed)   Fatigue   Relevant Orders   Comprehensive metabolic panel   CBC with Differential/Platelet   Vitamin B12   Vitamin D 1,25 dihydroxy   TSH   POCT  Urinalysis Dipstick (Automated) (Completed)    Other Visit Diagnoses    DM (diabetes mellitus) type II, controlled, with peripheral vascular disorder ([place])    -  Primary   Relevant Orders   Hemoglobin A1c   Comprehensive metabolic panel   POCT Urinalysis Dipstick (Automated) (Completed)   Hyperlipidemia LDL goal <70       Relevant Orders   Comprehensive metabolic panel   Lipid panel   POCT Urinalysis Dipstick (Automated) (Completed)   Urine abnormality       Relevant Orders   Urine Culture   Hematuria, unspecified type       Relevant Orders   Urine Culture   Proteinuria, unspecified type       Relevant Orders   Urine Culture      I have discontinued Lorne Skeens. Hogrefe's meclizine and Mirabegron (MYRBETRIQ PO). I am also having him maintain his ibuprofen, sildenafil, metoprolol tartrate, HYDROmorphone, glucose  blood, ONETOUCH DELICA LANCETS FINE, dapagliflozin propanediol, DULoxetine, Polyethylene Glycol 3350 (MIRALAX PO), bisacodyl, Diclofenac Sodium, JANUMET XR, atorvastatin, celecoxib, lisinopril-hydrochlorothiazide, and CARTIA XT.  No orders of the defined types were placed in this encounter.   CMA served as Education administrator during this visit. History, Physical and Plan performed by medical provider. Documentation and orders reviewed and attested to. = [name], DO

## 2017-12-09 NOTE — Assessment & Plan Note (Signed)
Running low--  Cut lisinopril in half  If still running low consider stopping Recheck 2-3 weeks

## 2017-12-09 NOTE — Assessment & Plan Note (Signed)
Tolerating statin, encouraged heart healthy diet, avoid trans fats, minimize simple carbs and saturated fats. Increase exercise as tolerated 

## 2017-12-09 NOTE — Patient Instructions (Signed)

## 2017-12-09 NOTE — Assessment & Plan Note (Signed)
hgba1c to be checked minimize simple carbs. Increase exercise as tolerated. Continue current meds 

## 2017-12-10 LAB — LIPID PANEL
CHOL/HDL RATIO: 2
Cholesterol: 139 mg/dL (ref 0–200)
HDL: 56.8 mg/dL (ref >39.00)
LDL Cholesterol: 61 mg/dL (ref 0–99)
NONHDL: 82.61
TRIGLYCERIDES: 107 mg/dL (ref 0.0–149.0)
VLDL: 21.4 mg/dL (ref 0.0–40.0)

## 2017-12-10 LAB — CBC WITH DIFFERENTIAL/PLATELET
BASOS ABS: 0.1 10*3/uL (ref 0.0–0.1)
Basophils Relative: 1 % (ref 0.0–3.0)
EOS PCT: 4.4 % (ref 0.0–5.0)
Eosinophils Absolute: 0.4 10*3/uL (ref 0.0–0.7)
HEMATOCRIT: 46.3 % (ref 39.0–52.0)
Hemoglobin: 15.8 g/dL (ref 13.0–17.0)
LYMPHS PCT: 23.5 % (ref 12.0–46.0)
Lymphs Abs: 2.1 10*3/uL (ref 0.7–4.0)
MCHC: 34 g/dL (ref 30.0–36.0)
MCV: 91.9 fl (ref 78.0–100.0)
MONOS PCT: 11.4 % (ref 3.0–12.0)
Monocytes Absolute: 1 10*3/uL (ref 0.1–1.0)
NEUTROS ABS: 5.4 10*3/uL (ref 1.4–7.7)
Neutrophils Relative %: 59.7 % (ref 43.0–77.0)
Platelets: 282 10*3/uL (ref 150.0–400.0)
RBC: 5.03 Mil/uL (ref 4.22–5.81)
RDW: 14 % (ref 11.5–15.5)
WBC: 9 10*3/uL (ref 4.0–10.5)

## 2017-12-10 LAB — COMPREHENSIVE METABOLIC PANEL
ALK PHOS: 50 U/L (ref 39–117)
ALT: 36 U/L (ref 0–53)
AST: 31 U/L (ref 0–37)
Albumin: 4.3 g/dL (ref 3.5–5.2)
BILIRUBIN TOTAL: 0.4 mg/dL (ref 0.2–1.2)
BUN: 28 mg/dL — ABNORMAL HIGH (ref 6–23)
CALCIUM: 10 mg/dL (ref 8.4–10.5)
CO2: 28 meq/L (ref 19–32)
CREATININE: 1.63 mg/dL — AB (ref 0.40–1.50)
Chloride: 102 mEq/L (ref 96–112)
GFR: 44.88 mL/min — AB (ref >60.00)
Glucose, Bld: 122 mg/dL — ABNORMAL HIGH (ref 70–99)
Potassium: 5 mEq/L (ref 3.5–5.1)
Sodium: 140 mEq/L (ref 135–145)
TOTAL PROTEIN: 6.9 g/dL (ref 6.0–8.3)

## 2017-12-10 LAB — HEMOGLOBIN A1C: Hgb A1c MFr Bld: 7.4 % — ABNORMAL HIGH (ref 4.6–6.5)

## 2017-12-10 LAB — VITAMIN B12: Vitamin B-12: 291 pg/mL (ref 211–911)

## 2017-12-10 LAB — TSH: TSH: 5.5 u[IU]/mL — ABNORMAL HIGH (ref 0.35–4.50)

## 2017-12-11 LAB — URINE CULTURE
MICRO NUMBER: 90646379
Result:: NO GROWTH
SPECIMEN QUALITY: ADEQUATE

## 2017-12-13 LAB — VITAMIN D 1,25 DIHYDROXY
VITAMIN D3 1, 25 (OH): 25 pg/mL
Vitamin D 1, 25 (OH)2 Total: 25 pg/mL (ref 18–72)

## 2017-12-22 ENCOUNTER — Encounter: Payer: Self-pay | Admitting: Family Medicine

## 2017-12-25 ENCOUNTER — Other Ambulatory Visit: Payer: Self-pay | Admitting: Family Medicine

## 2017-12-25 ENCOUNTER — Encounter: Payer: Self-pay | Admitting: Family Medicine

## 2017-12-25 DIAGNOSIS — I10 Essential (primary) hypertension: Secondary | ICD-10-CM

## 2017-12-25 MED ORDER — LISINOPRIL 10 MG PO TABS: 10.0000 mg | ORAL_TABLET | Freq: Every day | ORAL | 3 refills | Status: DC

## 2018-01-13 ENCOUNTER — Encounter: Payer: Self-pay | Admitting: Family Medicine

## 2018-01-14 ENCOUNTER — Encounter: Payer: Self-pay | Admitting: Internal Medicine

## 2018-02-05 ENCOUNTER — Other Ambulatory Visit: Payer: Self-pay | Admitting: Family Medicine

## 2018-02-10 ENCOUNTER — Other Ambulatory Visit: Payer: Self-pay | Admitting: Family Medicine

## 2018-02-10 DIAGNOSIS — E1151 Type 2 diabetes mellitus with diabetic peripheral angiopathy without gangrene: Secondary | ICD-10-CM

## 2018-02-10 DIAGNOSIS — E1165 Type 2 diabetes mellitus with hyperglycemia: Principal | ICD-10-CM

## 2018-02-10 DIAGNOSIS — IMO0002 Reserved for concepts with insufficient information to code with codable children: Secondary | ICD-10-CM

## 2018-03-11 ENCOUNTER — Ambulatory Visit (AMBULATORY_SURGERY_CENTER): Payer: Self-pay | Admitting: *Deleted

## 2018-03-11 VITALS — Ht 67.0 in | Wt 205.0 lb

## 2018-03-11 DIAGNOSIS — Z1211 Encounter for screening for malignant neoplasm of colon: Secondary | ICD-10-CM

## 2018-03-11 NOTE — Progress Notes (Signed)
No egg or soy allergy known to patient  No issues with past sedation with any surgeries  or procedures, no intubation problems  No diet pills per patient No home 02 use per patient  No blood thinners per patient  Pt denies issues with constipation  No A fib or A flutter  EMMI video sent to pt's e mail pt declined   

## 2018-03-12 ENCOUNTER — Encounter: Payer: Self-pay | Admitting: Internal Medicine

## 2018-03-24 ENCOUNTER — Ambulatory Visit (AMBULATORY_SURGERY_CENTER): Payer: PRIVATE HEALTH INSURANCE | Admitting: Internal Medicine

## 2018-03-24 ENCOUNTER — Encounter: Payer: Self-pay | Admitting: Internal Medicine

## 2018-03-24 VITALS — BP 109/70 | HR 74 | Temp 96.9°F | Resp 13 | Ht 67.0 in | Wt 205.0 lb

## 2018-03-24 DIAGNOSIS — Z1211 Encounter for screening for malignant neoplasm of colon: Secondary | ICD-10-CM

## 2018-03-24 MED ORDER — SODIUM CHLORIDE 0.9 % IV SOLN: 500.0000 mL | Freq: Once | INTRAVENOUS | Status: DC

## 2018-03-24 NOTE — Op Note (Signed)
[facility] Patient Name: Charles Trujillo Procedure Date: 03/24/2018 7:58 AM MRN: [id number] Endoscopist: Gatha Mayer , MD Age: 67 Referring MD:  Date of Birth: Apr 01, 1951 Gender: Male Account #: [id number] Procedure:                Colonoscopy Indications:              Screening for colorectal malignant neoplasm, Last                            colonoscopy: 2009 Medicines:                Propofol per Anesthesia, Monitored Anesthesia Care Procedure:                Pre-Anesthesia Assessment:                           - Prior to the procedure, a History and Physical                            was performed, and patient medications and                            allergies were reviewed. The patient's tolerance of                            previous anesthesia was also reviewed. The risks                            and benefits of the procedure and the sedation                            options and risks were discussed with the patient.                            All questions were answered, and informed consent                            was obtained. Prior Anticoagulants: The patient has                            taken no previous anticoagulant or antiplatelet                            agents. ASA Grade Assessment: II - A patient with                            mild systemic disease. After reviewing the risks                            and benefits, the patient was deemed in                            satisfactory condition to undergo the procedure.  After obtaining informed consent, the colonoscope                            was passed under direct vision. Throughout the                            procedure, the patient's blood pressure, pulse, and                            oxygen saturations were monitored continuously. The                            Colonoscope was introduced through the anus and                            advanced to the  the cecum, identified by                            appendiceal orifice and ileocecal valve. The                            colonoscopy was performed without difficulty. The                            patient tolerated the procedure well. The quality                            of the bowel preparation was good. The ileocecal                            valve, appendiceal orifice, and rectum were                            photographed. The bowel preparation used was                            Miralax. Scope In: 8:18:07 AM Scope Out: 8:33:14 AM Scope Withdrawal Time: 0 hours 12 minutes 46 seconds  Total Procedure Duration: 0 hours 15 minutes 7 seconds  Findings:                 The perianal and digital rectal examinations were                            normal.                           Multiple diverticula were found in the sigmoid                            colon.                           The exam was otherwise without abnormality on  direct and retroflexion views. Complications:            No immediate complications. Estimated Blood Loss:     Estimated blood loss: none. Impression:               - Diverticulosis in the sigmoid colon.                           - The examination was otherwise normal on direct                            and retroflexion views.                           - No specimens collected. Recommendation:           - Patient has a contact number available for                            emergencies. The signs and symptoms of potential                            delayed complications were discussed with the                            patient. Return to normal activities tomorrow.                            Written discharge instructions were provided to the                            patient.                           - Resume previous diet.                           - Continue present medications.                           - Repeat  colonoscopy in 10 years for screening                            purposes. Gatha Mayer, MD 03/24/2018 8:42:32 AM This report has been signed electronically.

## 2018-03-24 NOTE — Progress Notes (Signed)
Pt's states no medical or surgical changes since previsit or office visit. 

## 2018-03-24 NOTE — Progress Notes (Signed)
Report given to PACU, vss 

## 2018-03-24 NOTE — Progress Notes (Deleted)
Called to room to assist during endoscopic procedure.  Patient ID and intended procedure confirmed with present staff. Received instructions for my participation in the procedure from the performing physician.  

## 2018-03-24 NOTE — Patient Instructions (Addendum)
No polyps or cancer seen.  You still have diverticulosis - thickened muscle rings and pouches in the colon wall. Please read the handout about this condition.  Next routine colonoscopy or other screening test in 10 years - 2029  I appreciate the opportunity to care for you. Gatha Mayer, MD, FACG YOU HAD AN ENDOSCOPIC PROCEDURE TODAY AT [facility]:   Refer to the procedure report that was given to you for any specific questions about what was found during the examination.  If the procedure report does not answer your questions, please call your gastroenterologist to clarify.  If you requested that your care partner not be given the details of your procedure findings, then the procedure report has been included in a sealed envelope for you to review at your convenience later.  YOU SHOULD EXPECT: Some feelings of bloating in the abdomen. Passage of more gas than usual.  Walking can help get rid of the air that was put into your GI tract during the procedure and reduce the bloating. If you had a lower endoscopy (such as a colonoscopy or flexible sigmoidoscopy) you may notice spotting of blood in your stool or on the toilet paper. If you underwent a bowel prep for your procedure, you may not have a normal bowel movement for a few days.  Please Note:  You might notice some irritation and congestion in your nose or some drainage.  This is from the oxygen used during your procedure.  There is no need for concern and it should clear up in a day or so.  SYMPTOMS TO REPORT IMMEDIATELY:   Following lower endoscopy (colonoscopy or flexible sigmoidoscopy):  Excessive amounts of blood in the stool  Significant tenderness or worsening of abdominal pains  Swelling of the abdomen that is new, acute  Fever of 100F or higher    For urgent or emergent issues, a gastroenterologist can be reached at any hour by calling [phone].   DIET:  We do recommend a small meal at  first, but then you may proceed to your regular diet.  Drink plenty of fluids but you should avoid alcoholic beverages for 24 hours.  ACTIVITY:  You should plan to take it easy for the rest of today and you should NOT DRIVE or use heavy machinery until tomorrow (because of the sedation medicines used during the test).    FOLLOW UP: Our staff will call the number listed on your records the next business day following your procedure to check on you and address any questions or concerns that you may have regarding the information given to you following your procedure. If we do not reach you, we will leave a message.  However, if you are feeling well and you are not experiencing any problems, there is no need to return our call.  We will assume that you have returned to your regular daily activities without incident.  If any biopsies were taken you will be contacted by phone or by letter within the next 1-3 weeks.  Please call us at [phone] if you have not heard about the biopsies in 3 weeks.    SIGNATURES/CONFIDENTIALITY: You and/or your care partner have signed paperwork which will be entered into your electronic medical record.  These signatures attest to the fact that that the information above on your After Visit Summary has been reviewed and is understood.  Full responsibility of the confidentiality of this discharge information lies with you and/or your care-partner.  Please read handout on Diverticulosis. Continue present medications.

## 2018-03-25 ENCOUNTER — Telehealth: Payer: Self-pay

## 2018-03-25 ENCOUNTER — Telehealth: Payer: Self-pay | Admitting: *Deleted

## 2018-03-25 NOTE — Telephone Encounter (Signed)
No answer, left message to call back later today, B.Averie Hornbaker RN. 

## 2018-03-25 NOTE — Telephone Encounter (Signed)
  Follow up Call-  Call back number 03/24/2018  Post procedure Call Back phone  # [phone]  Permission to leave phone message Yes  Some recent data might be hidden    [facility]

## 2018-03-30 ENCOUNTER — Other Ambulatory Visit: Payer: Self-pay | Admitting: Family Medicine

## 2018-03-30 DIAGNOSIS — E1151 Type 2 diabetes mellitus with diabetic peripheral angiopathy without gangrene: Secondary | ICD-10-CM

## 2018-03-30 DIAGNOSIS — IMO0002 Reserved for concepts with insufficient information to code with codable children: Secondary | ICD-10-CM

## 2018-03-30 DIAGNOSIS — E1165 Type 2 diabetes mellitus with hyperglycemia: Principal | ICD-10-CM

## 2018-04-07 ENCOUNTER — Other Ambulatory Visit: Payer: Self-pay | Admitting: Family Medicine

## 2018-04-20 ENCOUNTER — Other Ambulatory Visit: Payer: Self-pay | Admitting: Family Medicine

## 2018-04-20 ENCOUNTER — Encounter: Payer: Self-pay | Admitting: Family Medicine

## 2018-04-20 DIAGNOSIS — R32 Unspecified urinary incontinence: Secondary | ICD-10-CM

## 2018-04-20 MED ORDER — MIRABEGRON ER 25 MG PO TB24: 25.0000 mg | ORAL_TABLET | Freq: Every day | ORAL | 3 refills | Status: DC

## 2018-05-11 ENCOUNTER — Other Ambulatory Visit: Payer: Self-pay | Admitting: Family Medicine

## 2018-05-13 ENCOUNTER — Encounter: Payer: Self-pay | Admitting: Family Medicine

## 2018-05-13 LAB — HM DIABETES EYE EXAM

## 2018-05-19 ENCOUNTER — Encounter: Payer: Self-pay | Admitting: Family Medicine

## 2018-05-19 ENCOUNTER — Ambulatory Visit: Payer: PRIVATE HEALTH INSURANCE | Admitting: Family Medicine

## 2018-05-19 VITALS — BP 120/66 | HR 80 | Temp 97.8°F | Resp 16 | Ht 67.0 in | Wt 206.4 lb

## 2018-05-19 DIAGNOSIS — E785 Hyperlipidemia, unspecified: Secondary | ICD-10-CM

## 2018-05-19 DIAGNOSIS — Z23 Encounter for immunization: Secondary | ICD-10-CM

## 2018-05-19 DIAGNOSIS — E1169 Type 2 diabetes mellitus with other specified complication: Secondary | ICD-10-CM | POA: Insufficient documentation

## 2018-05-19 DIAGNOSIS — IMO0002 Reserved for concepts with insufficient information to code with codable children: Secondary | ICD-10-CM

## 2018-05-19 DIAGNOSIS — I10 Essential (primary) hypertension: Secondary | ICD-10-CM

## 2018-05-19 DIAGNOSIS — E1151 Type 2 diabetes mellitus with diabetic peripheral angiopathy without gangrene: Secondary | ICD-10-CM

## 2018-05-19 DIAGNOSIS — E1165 Type 2 diabetes mellitus with hyperglycemia: Secondary | ICD-10-CM

## 2018-05-19 NOTE — Assessment & Plan Note (Signed)
Well controlled, no changes to meds. Encouraged heart healthy diet such as the DASH diet and exercise as tolerated.  °

## 2018-05-19 NOTE — Patient Instructions (Signed)

## 2018-05-19 NOTE — Assessment & Plan Note (Signed)
hgba1c to be checked, minimize simple carbs. Increase exercise as tolerated. Continue current meds  

## 2018-05-19 NOTE — Assessment & Plan Note (Signed)
Tolerating statin, encouraged heart healthy diet, avoid trans fats, minimize simple carbs and saturated fats. Increase exercise as tolerated 

## 2018-05-19 NOTE — Progress Notes (Signed)
Patient ID: Charles Trujillo, male    DOB: May 22, 1951  Age: 67 y.o. MRN: [id number]    Subjective:  Subjective  HPI Charles Trujillo presents for f/u dm, bp and chol HYPERTENSION   Blood pressure range-not checking   Chest pain- no      Dyspnea- no Lightheadedness- no   Edema- no  Other side effects - no   Medication compliance: good Low salt diet- yes    DIABETES    Blood Sugar ranges-113-129  Polyuria- no New Visual problems- no  Hypoglycemic symptoms- no  Other side effects-no Medication compliance - good Last eye exam- end of oct  Foot exam- today   HYPERLIPIDEMIA  Medication compliance- good RUQ pain- no  Muscle aches- no Other side effects-no      Review of Systems  Constitutional: Negative for appetite change, diaphoresis, fatigue and unexpected weight change.  Eyes: Negative for pain, redness and visual disturbance.  Respiratory: Negative for cough, chest tightness, shortness of breath and wheezing.   Cardiovascular: Negative for chest pain, palpitations and leg swelling.  Endocrine: Negative for cold intolerance, heat intolerance, polydipsia, polyphagia and polyuria.  Genitourinary: Negative for difficulty urinating, dysuria and frequency.  Neurological: Negative for dizziness, light-headedness, numbness and headaches.    History Past Medical History:  Diagnosis Date  . Anxiety   . Arthritis    bilateral knees  . Depression   . DM2 (diabetes mellitus, type 2) ([place])   . Dysrhythmia     benign tachycardia, takes [place]  . Elevated LFTs    previously evaluated by Dr Sharlett Iles; h/o "fatty liver"  . GERD (gastroesophageal reflux disease)   . HLD (hyperlipidemia)   . Hypertension   . Inguinal hernia    right  . Squamous cell carcinoma    top of head    He has a past surgical history that includes Knee surgery; Achilles tendon repair (1999); Squamous cell carcinoma excision; Knee arthroscopy (1993); Inguinal hernia repair (08/09/2011); Hernia  repair (07/2011); Carpal tunnel with cubital tunnel (Left, 07/22/2014); Carpal tunnel release (Right, 11/04/2014); and Colonoscopy.   His family history includes Alcohol abuse in his brother; Cancer in his maternal grandmother; Depression in his brother; Diabetes in his mother; Heart disease in his mother; Heart failure in his mother; Hypertension in his mother; Parkinsonism in his father; Stroke (age of onset: 38) in his father.He reports that he has been smoking cigars. He has never used smokeless tobacco. He reports that he drinks about 3.0 - 4.0 standard drinks of alcohol per week. He reports that he does not use drugs.  Current Outpatient Medications on File Prior to Visit  Medication Sig Dispense Refill  . atorvastatin (LIPITOR) 20 MG tablet TAKE 1 TABLET BY MOUTH DAILY. 90 tablet 2  . CARTIA XT 120 MG 24 hr capsule TAKE 1 CAPSULE DAILY BY MOUTH. 90 capsule 1  . celecoxib (CELEBREX) 200 MG capsule TAKE 1 CAPSULE BY MOUTH DAILY. 90 capsule 0  . dapagliflozin propanediol (FARXIGA) 10 MG TABS tablet Take 10 mg by mouth daily. 90 tablet 3  . Diclofenac Sodium (PENNSAID) 2 % SOLN Place 2 g onto the skin 2 (two) times daily. 112 g 3  . DULoxetine (CYMBALTA) 60 MG capsule TAKE ONE CAPSULE BY MOUTH ONCE DAILY 90 capsule 0  . FARXIGA 5 MG TABS tablet TAKE 1 TABLET BY MOUTH DAILY. 90 tablet 3  . glucose blood (ONE TOUCH TEST STRIPS) test strip Check blood sugar twice daily 100 each 11  . glucose blood test  strip 1 each by Other route 2 (two) times daily. accu check    . ibuprofen (ADVIL,MOTRIN) 200 MG tablet Take 400 mg by mouth as needed. Pain     . lisinopril (PRINIVIL,ZESTRIL) 10 MG tablet Take 1 tablet (10 mg total) by mouth daily. 90 tablet 3  . metoprolol (LOPRESSOR) 50 MG tablet Take 1 tablet by mouth 2 (two) times daily.    . mirabegron ER (MYRBETRIQ) 25 MG TB24 tablet Take 1 tablet (25 mg total) by mouth daily. 90 tablet 3  . ONETOUCH DELICA LANCETS FINE MISC Use qd 100 each 3  . sildenafil  (VIAGRA) 100 MG tablet Take 1 tablet (100 mg total) by mouth as needed for erectile dysfunction. Take 100 mg by mouth daily as needed for Erectile Dysfunction. 10 tablet 2  . SitaGLIPtin-MetFORMIN HCl (JANUMET XR) [phone] MG TB24 Take 1 tablet by mouth daily. 90 tablet 0   No current facility-administered medications on file prior to visit.      Objective:  Objective  Physical Exam  Constitutional: He is oriented to person, place, and time. Vital signs are normal. He appears well-developed and well-nourished. He is sleeping.  HENT:  Head: Normocephalic and atraumatic.  Mouth/Throat: Oropharynx is clear and moist.  Eyes: Pupils are equal, round, and reactive to light. EOM are normal.  Neck: Normal range of motion. Neck supple. No thyromegaly present.  Cardiovascular: Normal rate and regular rhythm.  No murmur heard. Pulmonary/Chest: Effort normal and breath sounds normal. No respiratory distress. He has no wheezes. He has no rales. He exhibits no tenderness.  Musculoskeletal: He exhibits no edema or tenderness.  Neurological: He is alert and oriented to person, place, and time.  Skin: Skin is warm and dry.  Psychiatric: He has a normal mood and affect. His behavior is normal. Judgment and thought content normal.  Nursing note and vitals reviewed.  Diabetic Foot Exam - Simple   Simple Foot Form Diabetic Foot exam was performed with the following findings:  Yes 05/19/2018  5:48 PM  Visual Inspection No deformities, no ulcerations, no other skin breakdown bilaterally:  Yes Sensation Testing Intact to touch and monofilament testing bilaterally:  Yes Pulse Check Posterior Tibialis and Dorsalis pulse intact bilaterally:  Yes Comments      Lab Results  Component Value Date   WBC 9.0 12/09/2017   HGB 15.8 12/09/2017   HCT 46.3 12/09/2017   PLT 282.0 12/09/2017   GLUCOSE 122 (H) 12/09/2017   CHOL 139 12/09/2017   TRIG 107.0 12/09/2017   HDL 56.80 12/09/2017   LDLDIRECT 143.5  02/01/2011   LDLCALC 61 12/09/2017   ALT 36 12/09/2017   AST 31 12/09/2017   NA 140 12/09/2017   K 5.0 12/09/2017   CL 102 12/09/2017   CREATININE 1.63 (H) 12/09/2017   BUN 28 (H) 12/09/2017   CO2 28 12/09/2017   TSH 5.50 (H) 12/09/2017   PSA 0.33 04/03/2012   HGBA1C 7.4 (H) 12/09/2017   MICROALBUR 1.0 11/01/2014    US Pelvis Limited  Result Date: 11/06/2015 CLINICAL DATA:  Left inguinal pain. EXAM: US PELVIS LIMITED TECHNIQUE: Ultrasound examination of the pelvic soft tissues was performed in the area of clinical concern. COMPARISON:  Ultrasound 03/30/2015 . FINDINGS: No cystic or solid mass is noted in the region of clinical concern in the left lower quadrant. No hernia noted. If symptoms persist CT of the abdomen pelvis can be obtained. IMPRESSION: Negative exam. Electronically Signed   ByMarcello Moores  Register   On: 11/06/2015  08:53     Assessment & Plan:  Plan  I have discontinued Lorne Skeens. Besser's HYDROmorphone, Polyethylene Glycol 3350 (MIRALAX PO), and bisacodyl. I am also having him maintain his ibuprofen, sildenafil, metoprolol tartrate, glucose blood, ONETOUCH DELICA LANCETS FINE, dapagliflozin propanediol, Diclofenac Sodium, atorvastatin, CARTIA XT, lisinopril, glucose blood, FARXIGA, SitaGLIPtin-MetFORMIN HCl, mirabegron ER, DULoxetine, and celecoxib.  No orders of the defined types were placed in this encounter.   Problem List Items Addressed This Visit      Unprioritized   DM (diabetes mellitus) type II uncontrolled, periph vascular disorder ([place]) - Primary    hgba1c to be checked , minimize simple carbs. Increase exercise as tolerated. Continue current meds       Relevant Orders   Hemoglobin A1c   Comprehensive metabolic panel   Lipid panel   Essential hypertension    Well controlled, no changes to meds. Encouraged heart healthy diet such as the DASH diet and exercise as tolerated.       Relevant Orders   Hemoglobin A1c   Comprehensive metabolic panel    Lipid panel   Hyperlipidemia associated with type 2 diabetes mellitus ([place])    Tolerating statin, encouraged heart healthy diet, avoid trans fats, minimize simple carbs and saturated fats. Increase exercise as tolerated      Relevant Orders   Hemoglobin A1c   Comprehensive metabolic panel   Lipid panel    Other Visit Diagnoses    Need for shingles vaccine       Relevant Orders   Varicella-zoster vaccine IM (Shingrix) (Completed)      Follow-up: Return in about 6 months (around 11/17/2018), or if symptoms worsen or fail to improve, for hypertension, hyperlipidemia, diabetes II, annual exam, fasting.  [name], DO

## 2018-05-20 LAB — COMPREHENSIVE METABOLIC PANEL
ALT: 57 U/L — ABNORMAL HIGH (ref 0–53)
AST: 49 U/L — ABNORMAL HIGH (ref 0–37)
Albumin: 4.4 g/dL (ref 3.5–5.2)
Alkaline Phosphatase: 51 U/L (ref 39–117)
BUN: 18 mg/dL (ref 6–23)
CO2: 29 meq/L (ref 19–32)
Calcium: 9.8 mg/dL (ref 8.4–10.5)
Chloride: 104 mEq/L (ref 96–112)
Creatinine, Ser: 1.02 mg/dL (ref 0.40–1.50)
GFR: 76.99 mL/min (ref >60.00)
GLUCOSE: 111 mg/dL — AB (ref 70–99)
POTASSIUM: 4.5 meq/L (ref 3.5–5.1)
SODIUM: 140 meq/L (ref 135–145)
Total Bilirubin: 0.5 mg/dL (ref 0.2–1.2)
Total Protein: 6.7 g/dL (ref 6.0–8.3)

## 2018-05-20 LAB — LIPID PANEL
CHOL/HDL RATIO: 2
Cholesterol: 148 mg/dL (ref 0–200)
HDL: 64.9 mg/dL (ref >39.00)
LDL CALC: 67 mg/dL (ref 0–99)
NONHDL: 82.84
Triglycerides: 77 mg/dL (ref 0.0–149.0)
VLDL: 15.4 mg/dL (ref 0.0–40.0)

## 2018-05-20 LAB — HEMOGLOBIN A1C: HEMOGLOBIN A1C: 7 % — AB (ref 4.6–6.5)

## 2018-05-27 ENCOUNTER — Other Ambulatory Visit: Payer: Self-pay

## 2018-05-27 DIAGNOSIS — I10 Essential (primary) hypertension: Secondary | ICD-10-CM

## 2018-05-27 DIAGNOSIS — E785 Hyperlipidemia, unspecified: Secondary | ICD-10-CM

## 2018-05-27 DIAGNOSIS — R739 Hyperglycemia, unspecified: Secondary | ICD-10-CM

## 2018-05-27 DIAGNOSIS — E1169 Type 2 diabetes mellitus with other specified complication: Secondary | ICD-10-CM

## 2018-06-04 ENCOUNTER — Telehealth: Payer: Self-pay | Admitting: *Deleted

## 2018-06-04 NOTE — Telephone Encounter (Signed)
Received Diabetic Eye Exam Report from [facility]; forwarded to provider/SLS 11/21

## 2018-06-08 ENCOUNTER — Other Ambulatory Visit: Payer: Self-pay | Admitting: Family Medicine

## 2018-06-08 DIAGNOSIS — I10 Essential (primary) hypertension: Secondary | ICD-10-CM

## 2018-06-25 ENCOUNTER — Encounter: Payer: Self-pay | Admitting: Family Medicine

## 2018-06-29 ENCOUNTER — Other Ambulatory Visit: Payer: Self-pay | Admitting: Family Medicine

## 2018-06-29 DIAGNOSIS — I10 Essential (primary) hypertension: Secondary | ICD-10-CM

## 2018-07-01 ENCOUNTER — Encounter: Payer: Self-pay | Admitting: Family Medicine

## 2018-07-01 DIAGNOSIS — I10 Essential (primary) hypertension: Secondary | ICD-10-CM

## 2018-07-02 MED ORDER — LISINOPRIL 10 MG PO TABS: 10.0000 mg | ORAL_TABLET | Freq: Every day | ORAL | 3 refills | Status: DC

## 2018-07-10 ENCOUNTER — Other Ambulatory Visit: Payer: Self-pay | Admitting: Family Medicine

## 2018-08-07 ENCOUNTER — Other Ambulatory Visit: Payer: Self-pay | Admitting: Family Medicine

## 2018-08-17 ENCOUNTER — Other Ambulatory Visit: Payer: Self-pay | Admitting: Family Medicine

## 2018-08-19 ENCOUNTER — Other Ambulatory Visit: Payer: Self-pay | Admitting: Family Medicine

## 2018-08-19 MED ORDER — ATORVASTATIN CALCIUM 20 MG PO TABS: 20.0000 mg | ORAL_TABLET | Freq: Every day | ORAL | 1 refills | Status: DC

## 2018-09-09 ENCOUNTER — Telehealth: Payer: Self-pay | Admitting: *Deleted

## 2018-09-09 NOTE — Telephone Encounter (Signed)
Pt is due for 2nd Shingrix vaccine now through 11/17/18. He is also due for 6 month follow up with DR Carollee Herter on 11/17/18. Sent mychart message to pt to call and schedule OV and can do vaccine at that appt.

## 2018-09-14 ENCOUNTER — Telehealth: Payer: PRIVATE HEALTH INSURANCE | Admitting: Family

## 2018-09-14 DIAGNOSIS — R399 Unspecified symptoms and signs involving the genitourinary system: Secondary | ICD-10-CM

## 2018-09-14 NOTE — Progress Notes (Signed)
We are sorry that you are not feeling well.  Here is how we plan to help!  Male bladder infections are not very common.  We worry about prostate or kidney conditions.  The standard of care is to examine the abdomen and kidneys, and to do a urine and blood test to make sure that something more serious is not going on.  We recommend that you see a provider today.  If your doctor's office is closed [facility] has the following Urgent Cares:  Approximately 5 minutes spent reviewing and documenting in patient's chart.   If you need care fast and have a high deductible or no insurance consider:  [email or web address] to reserve your spot online an avoid wait times  [facility] [address], [address] [place], [place] [address] 8 am to 8 pm Monday-Friday 10 am to 4 pm Saturday-Sunday *Across the street from [employer]  [place], [address] 8 am to 5 pm Monday-Friday * In the [facility] on the [facility]   The following sites will take your  insurance:  . [facility]gent [place] a Provider at this Location  [address] [place], [place] [address] . 10 am to 8 pm Monday-Friday . 12 pm to 8 pm Saturday-Sunday   . [facility] at [place] a Provider at this Location  [address] [place], [place], [place] [address] . 8 am to 8 pm Monday-Friday . 9 am to 6 pm Saturday . 11 am to 6 pm Sunday   . [facility] at [place] Get Driving Directions  [address] [place], [place] [address] . 8 am to 8 pm Monday-Friday . 8 am to 4 pm Saturday-Sunday   Your e-visit answers were reviewed by a board certified advanced clinical practitioner to complete your personal care plan.  Thank you for using e-Visits.

## 2018-09-16 ENCOUNTER — Encounter (INDEPENDENT_AMBULATORY_CARE_PROVIDER_SITE_OTHER): Payer: Self-pay | Admitting: Orthopaedic Surgery

## 2018-09-16 ENCOUNTER — Ambulatory Visit (INDEPENDENT_AMBULATORY_CARE_PROVIDER_SITE_OTHER): Payer: PRIVATE HEALTH INSURANCE | Admitting: Orthopaedic Surgery

## 2018-09-16 ENCOUNTER — Ambulatory Visit (INDEPENDENT_AMBULATORY_CARE_PROVIDER_SITE_OTHER): Payer: PRIVATE HEALTH INSURANCE

## 2018-09-16 VITALS — BP 112/71 | HR 78 | Ht 67.0 in | Wt 200.0 lb

## 2018-09-16 DIAGNOSIS — G8929 Other chronic pain: Secondary | ICD-10-CM

## 2018-09-16 DIAGNOSIS — M170 Bilateral primary osteoarthritis of knee: Secondary | ICD-10-CM | POA: Diagnosis not present

## 2018-09-16 DIAGNOSIS — M25562 Pain in left knee: Secondary | ICD-10-CM | POA: Diagnosis not present

## 2018-09-16 NOTE — Progress Notes (Signed)
Office Visit Note   Patient: Charles Trujillo           Date of Birth: 01/12/1951           MRN: [id number] Visit Date: 09/16/2018              Requested by: [address], [place], [place] [address] [place], [place] [address] PCP: Carollee Herter, Alferd Apa, DO   Assessment & Plan: Visit Diagnoses:  1. Chronic pain of left knee   2. Primary osteoarthritis of both knees     Plan: End-stage osteoarthritis left knee.  Discussion regarding treatment options.  Charles Trujillo would like to proceed with knee replacement sometime in June.  Will provide a clearance form for Dr. Etter Sjogren.  Office visit over 45 minutes 50% of the time in counseling  Follow-Up Instructions: No follow-ups on file.   Orders:  Orders Placed This Encounter  Procedures  . XR KNEE 3 VIEW LEFT   No orders of the defined types were placed in this encounter.     Procedures: No procedures performed   Clinical Data: No additional findings.   Subjective: Chief Complaint  Patient presents with  . Left Knee - Pain  Patient presents today with left knee pain. He said that he used to play a lot of tennis and it has worn out his knee. His pain is located on the anterior side behind his patella. Weightbearing and climbing stairs causes more pain. He has been experiencing popping, clicking, and grinding in his knee. No swelling. He has been taking Ibuprofen as needed and taking celebrex daily. No numbness or tingling in his left lower extremity. Charles Trujillo is presently 68 years old and has been seen on a number of occasions in the past although it is been several years since his last office visit.  He has had prior evidence of significant end-stage osteoarthritis per films in 2015.  He has been able to function fairly well with an occasional ibuprofen.  He has had a course of cortisone and Synvisc that did provide some relief.  He is reached a point where his left knee in particular has been compromising his  activities including sleep.  He is ready to discuss knee replacement  HPI  Review of Systems   Objective: Vital Signs: BP 112/71   Pulse 78   Ht 5\' 7"  (1.702 m)   Wt 200 lb (90.7 kg)   BMI 31.32 kg/m   Physical Exam Constitutional:      Appearance: He is well-developed.  Eyes:     Pupils: Pupils are equal, round, and reactive to light.  Pulmonary:     Effort: Pulmonary effort is normal.  Skin:    General: Skin is warm and dry.  Neurological:     Mental Status: He is alert and oriented to person, place, and time.  Psychiatric:        Behavior: Behavior normal.     Ortho Exam awake alert and oriented x3.  Comfortable sitting.  Lacks just a few degrees to full extension left knee.  Minimal effusion.  The knee was not hot red warm or swollen swollen other than the effusion.  Increased varus with weightbearing.  No instability.  Flexed about 106 degrees with a goniometer.  No distal edema.  Neurologically intact.  Straight leg raise negative.  Painless range of motion left hip.  Some patellar crepitation.  Predominately medial joint pain  Specialty Comments:  No specialty comments  available.  Imaging: Xr Knee 3 View Left  Result Date: 09/16/2018 Films of the left knee were obtained in 3 projections standing.  There is bone-on-bone in the medial compartment with subchondral sclerosis and peripheral osteophytes.  There is probably at least 5 degrees of varus.  There are degenerative changes the patellofemoral and lateral compartments as well.  Films are consistent with end-stage osteoarthritis without acute change    PMFS History: Patient Active Problem List   Diagnosis Date Noted  . Hyperlipidemia associated with type 2 diabetes mellitus ([place]) 05/19/2018  . Degenerative arthritis of knee, bilateral 06/20/2017  . Left inguinal pain 11/06/2015  . Dysuria 03/27/2015  . Fatigue 03/27/2015  . Benign paroxysmal positional vertigo 04/22/2014  . Dizziness 04/22/2014  . Primary  localized osteoarthrosis, lower leg 09/15/2013  . Obesity (BMI 30-39.9) 08/19/2013  . Other and unspecified hyperlipidemia 02/19/2013  . HLD (hyperlipidemia) 03/12/2012  . Tachycardia 03/06/2012  . Inguinal hernia unilateral, non-recurrent 08/02/2011  . Inguinal hernia 06/26/2011  . Preventative health care 02/01/2011  . DM (diabetes mellitus) type II uncontrolled, periph vascular disorder ([place]) 10/20/2009  . CARPAL TUNNEL SYNDROME, BILATERAL 10/03/2009  . ERECTILE DYSFUNCTION, ORGANIC, HX OF 02/06/2009  . DEPRESSION 01/20/2008  . Essential hypertension 01/20/2008  . URI 06/25/2007  . TRANSAMINASES, SERUM, ELEVATED 02/02/2007   Past Medical History:  Diagnosis Date  . Anxiety   . Arthritis    bilateral knees  . Depression   . DM2 (diabetes mellitus, type 2) ([place])   . Dysrhythmia     benign tachycardia, takes [place]  . Elevated LFTs    previously evaluated by Dr Sharlett Iles; h/o "fatty liver"  . GERD (gastroesophageal reflux disease)   . HLD (hyperlipidemia)   . Hypertension   . Inguinal hernia    right  . Squamous cell carcinoma    top of head    Family History  Problem Relation Age of Onset  . Diabetes Mother   . Hypertension Mother   . Heart disease Mother   . Heart failure Mother   . Stroke Father 81  . Parkinsonism Father   . Depression Brother   . Alcohol abuse Brother   . Cancer Maternal Grandmother        bone  . Colon cancer Neg Hx   . Colon polyps Neg Hx   . Esophageal cancer Neg Hx   . Rectal cancer Neg Hx   . Stomach cancer Neg Hx     Past Surgical History:  Procedure Laterality Date  . ACHILLES TENDON REPAIR  1999  . CARPAL TUNNEL RELEASE Right 11/04/2014   Procedure: LIMITED OPEN RIGHT CARPAL TUNNEL RELEASE;  Surgeon: Roseanne Kaufman, MD;  Location: [facility];  Service: Orthopedics;  Laterality: Right;  . CARPAL TUNNEL WITH CUBITAL TUNNEL Left 07/22/2014   Procedure: LEFT CARPAL TUNNEL RELEASE AND CUBITAL TUNNEL RELEASE TRANSPOSITION;   Surgeon: Roseanne Kaufman, MD;  Location: [facility];  Service: Orthopedics;  Laterality: Left;  . COLONOSCOPY    . HERNIA REPAIR  07/2011   left side  . INGUINAL HERNIA REPAIR  08/09/2011   Procedure: LAPAROSCOPIC INGUINAL HERNIA;  Surgeon: Joyice Faster. Cornett, MD;  Location: [facility];  Service: General;  Laterality: Right;  . KNEE ARTHROSCOPY  1993   left knee  . KNEE SURGERY     age 72- right  . SQUAMOUS CELL CARCINOMA EXCISION     left knee   Social History   Occupational History  . 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 Smoking status: Current Some Day Smoker    Types: Cigars  . Smokeless tobacco: Never Used  . Tobacco comment: smokes one every other day  Substance and Sexual Activity  . Alcohol use: Yes    Alcohol/week: 3.0 - 4.0 standard drinks    Types: 3 - 4 Cans of beer per week    Comment: social  . Drug use: No  . Sexual activity: Yes    Partners: Female

## 2018-12-13 ENCOUNTER — Other Ambulatory Visit: Payer: Self-pay | Admitting: Family Medicine

## 2018-12-13 DIAGNOSIS — I10 Essential (primary) hypertension: Secondary | ICD-10-CM

## 2018-12-16 ENCOUNTER — Telehealth: Payer: Self-pay | Admitting: *Deleted

## 2018-12-16 NOTE — Telephone Encounter (Signed)
Left message on machine to schedule virtual follow up visit.

## 2018-12-29 ENCOUNTER — Other Ambulatory Visit: Payer: Self-pay | Admitting: Family Medicine

## 2019-01-26 ENCOUNTER — Other Ambulatory Visit: Payer: Self-pay

## 2019-02-05 ENCOUNTER — Ambulatory Visit: Payer: PRIVATE HEALTH INSURANCE | Admitting: Family Medicine

## 2019-02-05 ENCOUNTER — Encounter: Payer: Self-pay | Admitting: Family Medicine

## 2019-02-05 ENCOUNTER — Other Ambulatory Visit: Payer: Self-pay

## 2019-02-05 VITALS — BP 141/84 | HR 71 | Temp 98.0°F | Resp 18 | Ht 67.0 in | Wt 204.0 lb

## 2019-02-05 DIAGNOSIS — I10 Essential (primary) hypertension: Secondary | ICD-10-CM | POA: Diagnosis not present

## 2019-02-05 DIAGNOSIS — E1165 Type 2 diabetes mellitus with hyperglycemia: Secondary | ICD-10-CM

## 2019-02-05 DIAGNOSIS — Z23 Encounter for immunization: Secondary | ICD-10-CM | POA: Diagnosis not present

## 2019-02-05 DIAGNOSIS — E1139 Type 2 diabetes mellitus with other diabetic ophthalmic complication: Secondary | ICD-10-CM | POA: Diagnosis not present

## 2019-02-05 DIAGNOSIS — E1169 Type 2 diabetes mellitus with other specified complication: Secondary | ICD-10-CM

## 2019-02-05 DIAGNOSIS — E785 Hyperlipidemia, unspecified: Secondary | ICD-10-CM | POA: Diagnosis not present

## 2019-02-05 DIAGNOSIS — IMO0002 Reserved for concepts with insufficient information to code with codable children: Secondary | ICD-10-CM

## 2019-02-05 DIAGNOSIS — E1151 Type 2 diabetes mellitus with diabetic peripheral angiopathy without gangrene: Secondary | ICD-10-CM

## 2019-02-05 LAB — COMPREHENSIVE METABOLIC PANEL
ALT: 60 U/L — ABNORMAL HIGH (ref 0–53)
AST: 56 U/L — ABNORMAL HIGH (ref 0–37)
Albumin: 4.4 g/dL (ref 3.5–5.2)
Alkaline Phosphatase: 50 U/L (ref 39–117)
BUN: 16 mg/dL (ref 6–23)
CO2: 30 mEq/L (ref 19–32)
Calcium: 9.6 mg/dL (ref 8.4–10.5)
Chloride: 101 mEq/L (ref 96–112)
Creatinine, Ser: 1.16 mg/dL (ref 0.40–1.50)
GFR: 62.31 mL/min (ref >60.00)
Glucose, Bld: 147 mg/dL — ABNORMAL HIGH (ref 70–99)
Potassium: 4.8 mEq/L (ref 3.5–5.1)
Sodium: 137 mEq/L (ref 135–145)
Total Bilirubin: 0.7 mg/dL (ref 0.2–1.2)
Total Protein: 6.6 g/dL (ref 6.0–8.3)

## 2019-02-05 LAB — LIPID PANEL
Cholesterol: 149 mg/dL (ref 0–200)
HDL: 67.5 mg/dL (ref >39.00)
LDL Cholesterol: 68 mg/dL (ref 0–99)
NonHDL: 81.85
Total CHOL/HDL Ratio: 2
Triglycerides: 68 mg/dL (ref 0.0–149.0)
VLDL: 13.6 mg/dL (ref 0.0–40.0)

## 2019-02-05 NOTE — Assessment & Plan Note (Signed)
Check labs  hgba1c to be checked, minimize simple carbs. Increase exercise as tolerated. Continue current meds  

## 2019-02-05 NOTE — Assessment & Plan Note (Signed)
Tolerating statin, encouraged heart healthy diet, avoid trans fats, minimize simple carbs and saturated fats. Increase exercise as tolerated 

## 2019-02-05 NOTE — Assessment & Plan Note (Signed)
Well controlled, no changes to meds. Encouraged heart healthy diet such as the DASH diet and exercise as tolerated.  °

## 2019-02-05 NOTE — Patient Instructions (Signed)
Carbohydrate Counting for Diabetes Mellitus, Adult  Carbohydrate counting is a method of keeping track of how many carbohydrates you eat. Eating carbohydrates naturally increases the amount of sugar (glucose) in the blood. Counting how many carbohydrates you eat helps keep your blood glucose within normal limits, which helps you manage your diabetes (diabetes mellitus). It is important to know how many carbohydrates you can safely have in each meal. This is different for every person. A diet and nutrition specialist (registered dietitian) can help you make a meal plan and calculate how many carbohydrates you should have at each meal and snack. Carbohydrates are found in the following foods:  Grains, such as breads and cereals.  Dried beans and soy products.  Starchy vegetables, such as potatoes, peas, and corn.  Fruit and fruit juices.  Milk and yogurt.  Sweets and snack foods, such as cake, cookies, candy, chips, and soft drinks. How do I count carbohydrates? There are two ways to count carbohydrates in food. You can use either of the methods or a combination of both. Reading "Nutrition Facts" on packaged food The "Nutrition Facts" list is included on the labels of almost all packaged foods and beverages in the [place] It includes:  The serving size.  Information about nutrients in each serving, including the grams (g) of carbohydrate per serving. To use the "Nutrition Facts":  Decide how many servings you will have.  Multiply the number of servings by the number of carbohydrates per serving.  The resulting number is the total amount of carbohydrates that you will be having. Learning standard serving sizes of other foods When you eat carbohydrate foods that are not packaged or do not include "Nutrition Facts" on the label, you need to measure the servings in order to count the amount of carbohydrates:  Measure the foods that you will eat with a food scale or measuring cup, if needed.   Decide how many standard-size servings you will eat.  Multiply the number of servings by 15. Most carbohydrate-rich foods have about 15 g of carbohydrates per serving. ? For example, if you eat 8 oz (170 g) of strawberries, you will have eaten 2 servings and 30 g of carbohydrates (2 servings x 15 g = 30 g).  For foods that have more than one food mixed, such as soups and casseroles, you must count the carbohydrates in each food that is included. The following list contains standard serving sizes of common carbohydrate-rich foods. Each of these servings has about 15 g of carbohydrates:   hamburger bun or  English muffin.   oz (15 mL) syrup.   oz (14 g) jelly.  1 slice of bread.  1 six-inch tortilla.  3 oz (85 g) cooked rice or pasta.  4 oz (113 g) cooked dried beans.  4 oz (113 g) starchy vegetable, such as peas, corn, or potatoes.  4 oz (113 g) hot cereal.  4 oz (113 g) mashed potatoes or  of a large baked potato.  4 oz (113 g) canned or frozen fruit.  4 oz (120 mL) fruit juice.  4-6 crackers.  6 chicken nuggets.  6 oz (170 g) unsweetened dry cereal.  6 oz (170 g) plain fat-free yogurt or yogurt sweetened with artificial sweeteners.  8 oz (240 mL) milk.  8 oz (170 g) fresh fruit or one small piece of fruit.  24 oz (680 g) popped popcorn. Example of carbohydrate counting Sample meal  3 oz (85 g) chicken breast.  6 oz (170 g)   brown rice.  4 oz (113 g) corn.  8 oz (240 mL) milk.  8 oz (170 g) strawberries with sugar-free whipped topping. Carbohydrate calculation 1. Identify the foods that contain carbohydrates: ? Rice. ? Corn. ? Milk. ? Strawberries. 2. Calculate how many servings you have of each food: ? 2 servings rice. ? 1 serving corn. ? 1 serving milk. ? 1 serving strawberries. 3. Multiply each number of servings by 15 g: ? 2 servings rice x 15 g = 30 g. ? 1 serving corn x 15 g = 15 g. ? 1 serving milk x 15 g = 15 g. ? 1 serving  strawberries x 15 g = 15 g. 4. Add together all of the amounts to find the total grams of carbohydrates eaten: ? 30 g + 15 g + 15 g + 15 g = 75 g of carbohydrates total. Summary  Carbohydrate counting is a method of keeping track of how many carbohydrates you eat.  Eating carbohydrates naturally increases the amount of sugar (glucose) in the blood.  Counting how many carbohydrates you eat helps keep your blood glucose within normal limits, which helps you manage your diabetes.  A diet and nutrition specialist (registered dietitian) can help you make a meal plan and calculate how many carbohydrates you should have at each meal and snack. This information is not intended to replace advice given to you by your health care provider. Make sure you discuss any questions you have with your health care provider. Document Released: 07/01/2005 Document Revised: 01/23/2017 Document Reviewed: 12/13/2015 Elsevier Patient Education  2020 Elsevier Inc.  

## 2019-02-05 NOTE — Progress Notes (Signed)
Patient ID: Charles Trujillo, male    DOB: [birth year]  Age: 68 y.o. MRN: [id number]    Subjective:  Subjective  HPI Charles Trujillo presents for f/u dm, bp and chol.     HYPERTENSION   Blood pressure range-good at home  Chest pain- no      Dyspnea- no Lightheadedness- no   Edema- no  Other side effects - no   Medication compliance: good Low salt diet- yes    DIABETES    Blood Sugar ranges-110-180  Polyuria- no New Visual problems- no  Hypoglycemic symptoms- no  Other side effects-no Medication compliance - good Last eye exam- last year-- due  Foot exam- today   HYPERLIPIDEMIA  Medication compliance- good RUQ pain- no  Muscle aches- no Other side effects-no        Review of Systems  Constitutional: Negative for appetite change, diaphoresis, fatigue and unexpected weight change.  Eyes: Negative for pain, redness and visual disturbance.  Respiratory: Negative for cough, chest tightness, shortness of breath and wheezing.   Cardiovascular: Negative for chest pain, palpitations and leg swelling.  Endocrine: Negative for cold intolerance, heat intolerance, polydipsia, polyphagia and polyuria.  Genitourinary: Negative for difficulty urinating, dysuria and frequency.  Neurological: Negative for dizziness, light-headedness, numbness and headaches.    History Past Medical History:  Diagnosis Date  . Anxiety   . Arthritis    bilateral knees  . Depression   . DM2 (diabetes mellitus, type 2) ([place])   . Dysrhythmia     benign tachycardia, takes [place]  . Elevated LFTs    previously evaluated by Dr Sharlett Iles; h/o "fatty liver"  . GERD (gastroesophageal reflux disease)   . HLD (hyperlipidemia)   . Hypertension   . Inguinal hernia    right  . Squamous cell carcinoma    top of head    He has a past surgical history that includes Knee surgery; Achilles tendon repair (1999); Squamous cell carcinoma excision; Knee arthroscopy (1993); Inguinal hernia repair  (08/09/2011); Hernia repair (07/2011); Carpal tunnel with cubital tunnel (Left, 07/22/2014); Carpal tunnel release (Right, 11/04/2014); and Colonoscopy.   His family history includes Alcohol abuse in his brother; Cancer in his maternal grandmother; Depression in his brother; Diabetes in his mother; Heart disease in his mother; Heart failure in his mother; Hypertension in his mother; Parkinsonism in his father; Stroke (age of onset: 21) in his father.He reports that he has been smoking cigars. He has never used smokeless tobacco. He reports current alcohol use of about 3.0 - 4.0 standard drinks of alcohol per week. He reports that he does not use drugs.  Current Outpatient Medications on File Prior to Visit  Medication Sig Dispense Refill  . atorvastatin (LIPITOR) 20 MG tablet Take 1 tablet (20 mg total) by mouth daily. 90 tablet 1  . celecoxib (CELEBREX) 200 MG capsule TAKE 1 CAPSULE BY MOUTH DAILY. 90 capsule 1  . dapagliflozin propanediol (FARXIGA) 10 MG TABS tablet Take 10 mg by mouth daily. 90 tablet 3  . diltiazem (CARDIZEM CD) 120 MG 24 hr capsule TAKE 1 CAPSULE DAILY BY MOUTH. 90 capsule 0  . DULoxetine (CYMBALTA) 60 MG capsule TAKE ONE CAPSULE BY MOUTH ONCE DAILY 90 capsule 1  . FARXIGA 5 MG TABS tablet TAKE 1 TABLET BY MOUTH DAILY. 90 tablet 3  . glucose blood (ONE TOUCH TEST STRIPS) test strip Check blood sugar twice daily 100 each 11  . glucose blood test strip 1 each by Other route 2 (two) times daily.  accu check    . ibuprofen (ADVIL,MOTRIN) 200 MG tablet Take 400 mg by mouth as needed. Pain     . JANUMET XR [phone] MG TB24 TAKE 1 TABLET BY MOUTH DAILY. 90 tablet 0  . lisinopril (PRINIVIL,ZESTRIL) 10 MG tablet TAKE 1 TABLET BY MOUTH DAILY. 90 tablet 1  . metoprolol (LOPRESSOR) 50 MG tablet Take 1 tablet by mouth 2 (two) times daily.    . mirabegron ER (MYRBETRIQ) 25 MG TB24 tablet Take 1 tablet (25 mg total) by mouth daily. 90 tablet 3  . ONETOUCH DELICA LANCETS FINE MISC Use qd 100  each 3  . sildenafil (VIAGRA) 100 MG tablet Take 1 tablet (100 mg total) by mouth as needed for erectile dysfunction. Take 100 mg by mouth daily as needed for Erectile Dysfunction. 10 tablet 2   No current facility-administered medications on file prior to visit.      Objective:  Objective  Physical Exam Vitals signs and nursing note reviewed.  Constitutional:      General: He is sleeping.     Appearance: He is well-developed.  HENT:     Head: Normocephalic and atraumatic.  Eyes:     Pupils: Pupils are equal, round, and reactive to light.  Neck:     Musculoskeletal: Normal range of motion and neck supple.     Thyroid: No thyromegaly.  Cardiovascular:     Rate and Rhythm: Normal rate and regular rhythm.     Heart sounds: No murmur.  Pulmonary:     Effort: Pulmonary effort is normal. No respiratory distress.     Breath sounds: Normal breath sounds. No wheezing or rales.  Chest:     Chest wall: No tenderness.  Musculoskeletal:        General: No tenderness.  Skin:    General: Skin is warm and dry.  Neurological:     Mental Status: He is oriented to person, place, and time.  Psychiatric:        Behavior: Behavior normal.        Thought Content: Thought content normal.        Judgment: Judgment normal.    Diabetic Foot Exam - Simple   Simple Foot Form Diabetic Foot exam was performed with the following findings: Yes 02/05/2019  4:07 PM  Visual Inspection No deformities, no ulcerations, no other skin breakdown bilaterally: Yes Sensation Testing Intact to touch and monofilament testing bilaterally: Yes Pulse Check Posterior Tibialis and Dorsalis pulse intact bilaterally: Yes Comments     BP (!) 141/84 (BP Location: Left Arm, Patient Position: Sitting, Cuff Size: Normal)   Pulse 71   Temp 98 F (36.7 C) (Oral)   Resp 18   Ht 5\' 7"  (1.702 m)   Wt 204 lb (92.5 kg)   SpO2 98%   BMI 31.95 kg/m  Wt Readings from Last 3 Encounters:  02/05/19 204 lb (92.5 kg)   09/16/18 200 lb (90.7 kg)  05/19/18 206 lb 6.4 oz (93.6 kg)     Lab Results  Component Value Date   WBC 9.0 12/09/2017   HGB 15.8 12/09/2017   HCT 46.3 12/09/2017   PLT 282.0 12/09/2017   GLUCOSE 111 (H) 05/19/2018   CHOL 148 05/19/2018   TRIG 77.0 05/19/2018   HDL 64.90 05/19/2018   LDLDIRECT 143.5 02/01/2011   LDLCALC 67 05/19/2018   ALT 57 (H) 05/19/2018   AST 49 (H) 05/19/2018   NA 140 05/19/2018   K 4.5 05/19/2018   CL 104 05/19/2018  CREATININE 1.02 05/19/2018   BUN 18 05/19/2018   CO2 29 05/19/2018   TSH 5.50 (H) 12/09/2017   PSA 0.33 04/03/2012   HGBA1C 7.0 (H) 05/19/2018   MICROALBUR 1.0 11/01/2014    US Pelvis Limited  Result Date: 11/06/2015 CLINICAL DATA:  Left inguinal pain. EXAM: US PELVIS LIMITED TECHNIQUE: Ultrasound examination of the pelvic soft tissues was performed in the area of clinical concern. COMPARISON:  Ultrasound 03/30/2015 . FINDINGS: No cystic or solid mass is noted in the region of clinical concern in the left lower quadrant. No hernia noted. If symptoms persist CT of the abdomen pelvis can be obtained. IMPRESSION: Negative exam. Electronically Signed   By: Marcello Moores  Register   On: 11/06/2015 08:53     Assessment & Plan:  Plan  I have discontinued Charles Skeens. Trujillo's Diclofenac Sodium. I am also having him maintain his ibuprofen, sildenafil, metoprolol tartrate, glucose blood, OneTouch Delica Lancets Fine, dapagliflozin propanediol, glucose blood, Farxiga, mirabegron ER, lisinopril, celecoxib, DULoxetine, atorvastatin, diltiazem, and Janumet XR.  No orders of the defined types were placed in this encounter.   Problem List Items Addressed This Visit      Unprioritized   DM (diabetes mellitus) type II uncontrolled, periph vascular disorder ([place])    Check labs hgba1c to be checked , minimize simple carbs. Increase exercise as tolerated. Continue current meds       Essential hypertension    Well controlled, no changes to meds.  Encouraged heart healthy diet such as the DASH diet and exercise as tolerated.       Relevant Orders   Comprehensive metabolic panel   Hyperlipidemia associated with type 2 diabetes mellitus ([place])    Tolerating statin, encouraged heart healthy diet, avoid trans fats, minimize simple carbs and saturated fats. Increase exercise as tolerated      Relevant Orders   Lipid panel   Comprehensive metabolic panel    Other Visit Diagnoses    DM (diabetes mellitus) type II uncontrolled with eye manifestation ([place])    -  Primary   Relevant Orders   Hemoglobin A1c   Comprehensive metabolic panel   Need for shingles vaccine       Relevant Orders   Varicella-zoster vaccine IM (Shingrix) (Completed)      Follow-up: Return in about 6 months (around 08/08/2019), or if symptoms worsen or fail to improve, for hypertension, hyperlipidemia, diabetes II.  [name], DO

## 2019-02-08 ENCOUNTER — Encounter: Payer: Self-pay | Admitting: Family Medicine

## 2019-02-08 ENCOUNTER — Other Ambulatory Visit: Payer: Self-pay | Admitting: Family Medicine

## 2019-02-08 DIAGNOSIS — E1165 Type 2 diabetes mellitus with hyperglycemia: Secondary | ICD-10-CM

## 2019-02-08 DIAGNOSIS — E1169 Type 2 diabetes mellitus with other specified complication: Secondary | ICD-10-CM

## 2019-02-08 LAB — HEMOGLOBIN A1C: Hgb A1c MFr Bld: 7.1 % — ABNORMAL HIGH (ref 4.6–6.5)

## 2019-02-09 ENCOUNTER — Encounter: Payer: Self-pay | Admitting: Family Medicine

## 2019-02-12 ENCOUNTER — Encounter: Payer: Self-pay | Admitting: Family Medicine

## 2019-02-15 ENCOUNTER — Other Ambulatory Visit: Payer: Self-pay | Admitting: Family Medicine

## 2019-02-16 ENCOUNTER — Other Ambulatory Visit: Payer: Self-pay | Admitting: Family Medicine

## 2019-02-25 ENCOUNTER — Encounter: Payer: Self-pay | Admitting: Family Medicine

## 2019-02-26 NOTE — Telephone Encounter (Signed)
Please advise 

## 2019-03-01 ENCOUNTER — Encounter: Payer: Self-pay | Admitting: Family Medicine

## 2019-03-10 ENCOUNTER — Encounter: Payer: Self-pay | Admitting: Family Medicine

## 2019-03-13 ENCOUNTER — Other Ambulatory Visit: Payer: Self-pay | Admitting: Family Medicine

## 2019-03-13 DIAGNOSIS — R32 Unspecified urinary incontinence: Secondary | ICD-10-CM

## 2019-03-18 ENCOUNTER — Other Ambulatory Visit: Payer: Self-pay | Admitting: Family Medicine

## 2019-03-18 DIAGNOSIS — I10 Essential (primary) hypertension: Secondary | ICD-10-CM

## 2019-03-24 ENCOUNTER — Encounter: Payer: Self-pay | Admitting: Family Medicine

## 2019-04-06 ENCOUNTER — Telehealth: Payer: Self-pay | Admitting: Orthopaedic Surgery

## 2019-04-06 NOTE — Telephone Encounter (Signed)
Please see below.

## 2019-04-06 NOTE — Telephone Encounter (Signed)
Patient called stating he is ready to schedule surgery for his left knee.  Patient requested a return call to discuss details.

## 2019-04-07 ENCOUNTER — Encounter: Payer: Self-pay | Admitting: Family Medicine

## 2019-04-07 NOTE — Telephone Encounter (Signed)
called

## 2019-04-08 ENCOUNTER — Telehealth: Payer: Self-pay | Admitting: Orthopaedic Surgery

## 2019-04-08 NOTE — Telephone Encounter (Signed)
thanks

## 2019-04-08 NOTE — Telephone Encounter (Signed)
Patient's wife Levander Campion)  came into the office to drop off a pre-op clearance form to be completed by PCP. Placed in provider tray. Please call [phone] when ready for pick up.

## 2019-04-08 NOTE — Telephone Encounter (Signed)
Form given to Dr. Etter Sjogren

## 2019-04-08 NOTE — Telephone Encounter (Signed)
Patient has been cleared by Dr. Garnet Koyanagi from a medical standpoint, however a note on the clearance indicates patient needs to be seen by his cardiologist. I called patient to let him know and he plans to make an appointment with his cardiologist  Dr. Howell Rucks. Patient will get back to me with a date.

## 2019-04-08 NOTE — Telephone Encounter (Signed)
FYI

## 2019-04-11 ENCOUNTER — Other Ambulatory Visit: Payer: Self-pay | Admitting: Family Medicine

## 2019-04-15 ENCOUNTER — Encounter (INDEPENDENT_AMBULATORY_CARE_PROVIDER_SITE_OTHER): Payer: Self-pay | Admitting: Orthopaedic Surgery

## 2019-04-19 ENCOUNTER — Other Ambulatory Visit: Payer: Self-pay

## 2019-04-20 ENCOUNTER — Ambulatory Visit (INDEPENDENT_AMBULATORY_CARE_PROVIDER_SITE_OTHER): Payer: PRIVATE HEALTH INSURANCE | Admitting: Orthopedic Surgery

## 2019-04-20 ENCOUNTER — Other Ambulatory Visit: Payer: Self-pay

## 2019-04-20 ENCOUNTER — Encounter: Payer: Self-pay | Admitting: Orthopedic Surgery

## 2019-04-20 DIAGNOSIS — M1712 Unilateral primary osteoarthritis, left knee: Secondary | ICD-10-CM | POA: Diagnosis not present

## 2019-04-20 NOTE — H&P (Signed)
TOTAL KNEE ADMISSION H&P  Patient is being admitted for left total knee arthroplasty.  Subjective:  Chief Complaint:left knee pain.  HPI: Charles Trujillo, 68 y.o. male, has a history of pain and functional disability in the left knee due to arthritis and has failed non-surgical conservative treatments for greater than 12 weeks to includeNSAID's and/or analgesics, corticosteriod injections, viscosupplementation injections, flexibility and strengthening excercises and activity modification.  Onset of symptoms was gradual, starting 7 years ago with gradually worsening course since that time. The patient noted no past surgery on the left knee(s).  Patient currently rates pain in the left knee(s) at 6 out of 10 with activity. Patient has night pain, worsening of pain with activity and weight bearing, pain that interferes with activities of daily living, crepitus and joint swelling.  Patient has evidence of subchondral cysts, subchondral sclerosis, periarticular osteophytes, joint subluxation and joint space narrowing by imaging studies. There is no active infection.  Patient Active Problem List   Diagnosis Date Noted   Hyperlipidemia associated with type 2 diabetes mellitus ([place]) 05/19/2018   Degenerative arthritis of knee, bilateral 06/20/2017   Left inguinal pain 11/06/2015   Dysuria 03/27/2015   Fatigue 03/27/2015   Benign paroxysmal positional vertigo 04/22/2014   Dizziness 04/22/2014   Primary localized osteoarthrosis, lower leg 09/15/2013   Obesity (BMI 30-39.9) 08/19/2013   Other and unspecified hyperlipidemia 02/19/2013   HLD (hyperlipidemia) 03/12/2012   Tachycardia 03/06/2012   Inguinal hernia unilateral, non-recurrent 08/02/2011   Inguinal hernia 06/26/2011   Preventative health care 02/01/2011   DM (diabetes mellitus) type II uncontrolled, periph vascular disorder ([place]) 10/20/2009   CARPAL TUNNEL SYNDROME, BILATERAL 10/03/2009   ERECTILE DYSFUNCTION, ORGANIC,  HX OF 02/06/2009   DEPRESSION 01/20/2008   Essential hypertension 01/20/2008   URI 06/25/2007   TRANSAMINASES, SERUM, ELEVATED 02/02/2007   Past Medical History:  Diagnosis Date   Anxiety    Arthritis    bilateral knees   Depression    DM2 (diabetes mellitus, type 2) ([facility])    Dysrhythmia     benign tachycardia, takes [place]   Elevated LFTs    previously evaluated by Dr Sharlett Iles; h/o "fatty liver"   GERD (gastroesophageal reflux disease)    HLD (hyperlipidemia)    Hypertension    Inguinal hernia    right   Squamous cell carcinoma    top of head    Past Surgical History:  Procedure Laterality Date   ACHILLES TENDON REPAIR  1999   CARPAL TUNNEL RELEASE Right 11/04/2014   Procedure: LIMITED OPEN RIGHT CARPAL TUNNEL RELEASE;  Surgeon: Roseanne Kaufman, MD;  Location: [facility];  Service: Orthopedics;  Laterality: Right;   CARPAL TUNNEL WITH CUBITAL TUNNEL Left 07/22/2014   Procedure: LEFT CARPAL TUNNEL RELEASE AND CUBITAL TUNNEL RELEASE TRANSPOSITION;  Surgeon: Roseanne Kaufman, MD;  Location: [facility];  Service: Orthopedics;  Laterality: Left;   COLONOSCOPY     HERNIA REPAIR  07/2011   left side   INGUINAL HERNIA REPAIR  08/09/2011   Procedure: LAPAROSCOPIC INGUINAL HERNIA;  Surgeon: Joyice Faster. Cornett, MD;  Location: [facility];  Service: General;  Laterality: Right;   KNEE ARTHROSCOPY  1993   left knee   KNEE SURGERY     age 64- right   SQUAMOUS CELL CARCINOMA EXCISION     left knee    No current facility-administered medications for this encounter.    Current Outpatient Medications  Medication Sig Dispense Refill Last Dose   atorvastatin (LIPITOR) 20 MG  tablet TAKE 1 TABLET BY MOUTH DAILY. 90 tablet 1 Taking   celecoxib (CELEBREX) 200 MG capsule TAKE 1 CAPSULE BY MOUTH DAILY. 90 capsule 1 Taking   dapagliflozin propanediol (FARXIGA) 10 MG TABS tablet Take 10 mg by mouth daily. (Patient not taking: Reported on 04/20/2019)  90 tablet 3 Not Taking   diltiazem (CARDIZEM CD) 120 MG 24 hr capsule TAKE 1 CAPSULE BY MOUTH EVERY DAY 90 capsule 1 Taking   DULoxetine (CYMBALTA) 60 MG capsule TAKE ONE CAPSULE BY MOUTH ONCE DAILY (Patient not taking: Reported on 04/20/2019) 90 capsule 1 Not Taking   FARXIGA 5 MG TABS tablet TAKE 1 TABLET BY MOUTH DAILY. 90 tablet 3 Taking   glucose blood (ONE TOUCH TEST STRIPS) test strip Check blood sugar twice daily 100 each 11 Taking   glucose blood test strip 1 each by Other route 2 (two) times daily. accu check   Taking   ibuprofen (ADVIL,MOTRIN) 200 MG tablet Take 400 mg by mouth as needed. Pain    Taking   JANUMET XR [phone] MG TB24 TAKE 1 TABLET BY MOUTH DAILY. NEED OFFICE VISIT OR PHONE VISIT WITH PCP FOR MORE REFILLS. 90 tablet 1 Taking   lisinopril (PRINIVIL,ZESTRIL) 10 MG tablet TAKE 1 TABLET BY MOUTH DAILY. 90 tablet 1 Taking   metoprolol (LOPRESSOR) 50 MG tablet Take 1 tablet by mouth 2 (two) times daily.   Taking   MYRBETRIQ 25 MG TB24 tablet TAKE 1 TABLET BY MOUTH DAILY. 90 tablet 3 Taking   ONETOUCH DELICA LANCETS FINE MISC Use qd 100 each 3 Taking   sildenafil (VIAGRA) 100 MG tablet Take 1 tablet (100 mg total) by mouth as needed for erectile dysfunction. Take 100 mg by mouth daily as needed for Erectile Dysfunction. 10 tablet 2 Taking   Allergies  Allergen Reactions   Codeine Nausea Only   Codeine Phosphate     REACTION: nausea    Social History   Tobacco Use   Smoking status: Current Some Day Smoker    Types: Cigars   Smokeless tobacco: Never Used   Tobacco comment: smokes one every other day  Substance Use Topics   Alcohol use: Yes    Alcohol/week: 3.0 - 4.0 standard drinks    Types: 3 - 4 Cans of beer per week    Comment: social    Family History  Problem Relation Age of Onset   Diabetes Mother    Hypertension Mother    Heart disease Mother    Heart failure Mother    Stroke Father 44   Parkinsonism Father    Depression  Brother    Alcohol abuse Brother    Cancer Maternal Grandmother        bone   Colon cancer Neg Hx    Colon polyps Neg Hx    Esophageal cancer Neg Hx    Rectal cancer Neg Hx    Stomach cancer Neg Hx      Review of Systems  Constitutional: Negative.   HENT: Positive for tinnitus.   Eyes: Negative.   Respiratory: Negative.   Cardiovascular: Negative.   Gastrointestinal: Negative.   Genitourinary: Positive for dysuria, frequency and urgency.       Recent UTI symptoms and treated with antibiotics.... symptoms now gone  Musculoskeletal: Positive for joint pain (both knees).  Skin: Negative.   Neurological: Negative.   Endo/Heme/Allergies: Negative.   Psychiatric/Behavioral: Negative.     Objective:  Physical Exam  Constitutional: He is oriented to person, place, and time. He  appears well-developed and well-nourished.  HENT:  Head: Normocephalic and atraumatic.  Eyes: Pupils are equal, round, and reactive to light. Conjunctivae and EOM are normal.  Neck: Normal range of motion. Neck supple. No thyromegaly present.  Cardiovascular: Normal rate, regular rhythm and normal heart sounds.  No murmur heard. Respiratory: Effort normal and breath sounds normal. No respiratory distress. He has no wheezes.  GI: Soft. Bowel sounds are normal. He exhibits no distension. There is no abdominal tenderness.  Neurological: He is alert and oriented to person, place, and time.  Skin: Skin is warm and dry.  Psychiatric: He has a normal mood and affect. His behavior is normal. Judgment and thought content normal.  Musculoskeletal:  Mild effusion without warmth.  No incisions noted.  Crepitous with ROM. Varus deformity.    Vital signs in last 24 hours: Temp:  [97 F (36.1 C)] 97 F (36.1 C) (10/06 0800) Pulse Rate:  [75] 75 (10/06 0800) Resp:  [18] 18 (10/06 0800) BP: (142)/(77) 142/77 (10/06 0800) Weight:  [97 kg] 97 kg (10/06 0800)  Labs:   Estimated body mass index is 33.49 kg/m  as calculated from the following:   Height as of 04/20/19: 5\' 7"  (1.702 m).   Weight as of 04/20/19: 97 kg.   Imaging Review Plain radiographs demonstrate severe degenerative joint disease of the left knee(s). The overall alignment issignificant varus. The bone quality appears to be good for age and reported activity level.      Assessment/Plan:  End stage arthritis, left knee   The patient history, physical examination, clinical judgment of the provider and imaging studies are consistent with end stage degenerative joint disease of the left knee(s) and total knee arthroplasty is deemed medically necessary. The treatment options including medical management, injection therapy arthroscopy and arthroplasty were discussed at length. The risks and benefits of total knee arthroplasty were presented and reviewed. The risks due to aseptic loosening, infection, stiffness, patella tracking problems, thromboembolic complications and other imponderables were discussed. The patient acknowledged the explanation, agreed to proceed with the plan and consent was signed. Patient is being admitted for inpatient treatment for surgery, pain control, PT, OT, prophylactic antibiotics, VTE prophylaxis, progressive ambulation and ADL's and discharge planning. The patient is planning to be discharged home with home health services     Patient's anticipated LOS is less than 2 midnights, meeting these requirements: - Younger than 3 - Lives within 1 hour of care - Has a competent adult at home to recover with post-op recover - NO history of  - Chronic pain requiring opiods  - Diabetes  - Coronary Artery Disease  - Heart failure  - Heart attack  - Stroke  - DVT/VTE  - Cardiac arrhythmia  - Respiratory Failure/COPD  - Renal failure  - Anemia  - Advanced Liver disease  Mike Craze. Micheal Likens [id number]  04/20/2019 8:59 AM

## 2019-04-20 NOTE — Progress Notes (Signed)
Office Visit Note   Patient: Charles Trujillo           Date of Birth: July 12, 1951           MRN: [id number] Visit Date: 04/20/2019              Requested by: [address] [place], [place] [address] [place],  [place] [address] PCP: Carollee Herter, Alferd Apa, DO   Chief Complaint:left knee pain.  HPI: Charles Trujillo, 68 y.o. male, has a history of pain and functional disability in the left knee due to arthritis and has failed non-surgical conservative treatments for greater than 12 weeks to includeNSAID's and/or analgesics, corticosteriod injections, viscosupplementation injections, flexibility and strengthening excercises and activity modification.  Onset of symptoms was gradual, starting 7 years ago with gradually worsening course since that time. The patient noted no past surgery on the left knee(s).  Patient currently rates pain in the left knee(s) at 6 out of 10 with activity. Patient has night pain, worsening of pain with activity and weight bearing, pain that interferes with activities of daily living, crepitus and joint swelling.  Patient has evidence of subchondral cysts, subchondral sclerosis, periarticular osteophytes, joint subluxation and joint space narrowing by imaging studies. There is no active infection.  Patient Active Problem List   Diagnosis Date Noted  . Hyperlipidemia associated with type 2 diabetes mellitus ([place]) 05/19/2018  . Degenerative arthritis of knee, bilateral 06/20/2017  . Left inguinal pain 11/06/2015  . Dysuria 03/27/2015  . Fatigue 03/27/2015  . Benign paroxysmal positional vertigo 04/22/2014  . Dizziness 04/22/2014  . Primary localized osteoarthrosis, lower leg 09/15/2013  . Obesity (BMI 30-39.9) 08/19/2013  . Other and unspecified hyperlipidemia 02/19/2013  . HLD (hyperlipidemia) 03/12/2012  . Tachycardia 03/06/2012  . Inguinal hernia unilateral, non-recurrent 08/02/2011  . Inguinal hernia 06/26/2011  . Preventative health care  02/01/2011  . DM (diabetes mellitus) type II uncontrolled, periph vascular disorder ([place]) 10/20/2009  . CARPAL TUNNEL SYNDROME, BILATERAL 10/03/2009  . ERECTILE DYSFUNCTION, ORGANIC, HX OF 02/06/2009  . DEPRESSION 01/20/2008  . Essential hypertension 01/20/2008  . URI 06/25/2007  . TRANSAMINASES, SERUM, ELEVATED 02/02/2007   Past Medical History:  Diagnosis Date  . Anxiety   . Arthritis    bilateral knees  . Depression   . DM2 (diabetes mellitus, type 2) ([place])   . Dysrhythmia     benign tachycardia, takes [place]  . Elevated LFTs    previously evaluated by Dr Sharlett Iles; h/o "fatty liver"  . GERD (gastroesophageal reflux disease)   . HLD (hyperlipidemia)   . Hypertension   . Inguinal hernia    right  . Squamous cell carcinoma    top of head    Past Surgical History:  Procedure Laterality Date  . ACHILLES TENDON REPAIR  1999  . CARPAL TUNNEL RELEASE Right 11/04/2014   Procedure: LIMITED OPEN RIGHT CARPAL TUNNEL RELEASE;  Surgeon: Roseanne Kaufman, MD;  Location: [facility];  Service: Orthopedics;  Laterality: Right;  . CARPAL TUNNEL WITH CUBITAL TUNNEL Left 07/22/2014   Procedure: LEFT CARPAL TUNNEL RELEASE AND CUBITAL TUNNEL RELEASE TRANSPOSITION;  Surgeon: Roseanne Kaufman, MD;  Location: [facility];  Service: Orthopedics;  Laterality: Left;  . COLONOSCOPY    . HERNIA REPAIR  07/2011   left side  . INGUINAL HERNIA REPAIR  08/09/2011   Procedure: LAPAROSCOPIC INGUINAL HERNIA;  Surgeon: Joyice Faster. Cornett, MD;  Location: [facility];  Service: General;  Laterality: Right;  .  KNEE ARTHROSCOPY  1993   left knee  . KNEE SURGERY     age 83- right  . SQUAMOUS CELL CARCINOMA EXCISION     left knee    No current facility-administered medications for this encounter.    Current Outpatient Medications  Medication Sig Dispense Refill Last Dose  . atorvastatin (LIPITOR) 20 MG tablet TAKE 1 TABLET BY MOUTH DAILY. 90 tablet 1 Taking  . celecoxib (CELEBREX) 200 MG  capsule TAKE 1 CAPSULE BY MOUTH DAILY. 90 capsule 1 Taking  . dapagliflozin propanediol (FARXIGA) 10 MG TABS tablet Take 10 mg by mouth daily. (Patient not taking: Reported on 04/20/2019) 90 tablet 3 Not Taking  . diltiazem (CARDIZEM CD) 120 MG 24 hr capsule TAKE 1 CAPSULE BY MOUTH EVERY DAY 90 capsule 1 Taking  . DULoxetine (CYMBALTA) 60 MG capsule TAKE ONE CAPSULE BY MOUTH ONCE DAILY (Patient not taking: Reported on 04/20/2019) 90 capsule 1 Not Taking  . FARXIGA 5 MG TABS tablet TAKE 1 TABLET BY MOUTH DAILY. 90 tablet 3 Taking  . glucose blood (ONE TOUCH TEST STRIPS) test strip Check blood sugar twice daily 100 each 11 Taking  . glucose blood test strip 1 each by Other route 2 (two) times daily. accu check   Taking  . ibuprofen (ADVIL,MOTRIN) 200 MG tablet Take 400 mg by mouth as needed. Pain    Taking  . JANUMET XR [phone] MG TB24 TAKE 1 TABLET BY MOUTH DAILY. NEED OFFICE VISIT OR PHONE VISIT WITH PCP FOR MORE REFILLS. 90 tablet 1 Taking  . lisinopril (PRINIVIL,ZESTRIL) 10 MG tablet TAKE 1 TABLET BY MOUTH DAILY. 90 tablet 1 Taking  . metoprolol (LOPRESSOR) 50 MG tablet Take 1 tablet by mouth 2 (two) times daily.   Taking  . MYRBETRIQ 25 MG TB24 tablet TAKE 1 TABLET BY MOUTH DAILY. 90 tablet 3 Taking  . ONETOUCH DELICA LANCETS FINE MISC Use qd 100 each 3 Taking  . sildenafil (VIAGRA) 100 MG tablet Take 1 tablet (100 mg total) by mouth as needed for erectile dysfunction. Take 100 mg by mouth daily as needed for Erectile Dysfunction. 10 tablet 2 Taking   Allergies  Allergen Reactions  . Codeine Nausea Only  . Codeine Phosphate     REACTION: nausea    Social History   Tobacco Use  . Smoking status: Current Some Day Smoker    Types: Cigars  . Smokeless tobacco: Never Used  . Tobacco comment: smokes one every other day  Substance Use Topics  . Alcohol use: Yes    Alcohol/week: 3.0 - 4.0 standard drinks    Types: 3 - 4 Cans of beer per week    Comment: social    Family History  Problem  Relation Age of Onset  . Diabetes Mother   . Hypertension Mother   . Heart disease Mother   . Heart failure Mother   . Stroke Father 99  . Parkinsonism Father   . Depression Brother   . Alcohol abuse Brother   . Cancer Maternal Grandmother        bone  . Colon cancer Neg Hx   . Colon polyps Neg Hx   . Esophageal cancer Neg Hx   . Rectal cancer Neg Hx   . Stomach cancer Neg Hx      Review of Systems  Constitutional: Negative.   HENT: Positive for tinnitus.   Eyes: Negative.   Respiratory: Negative.   Cardiovascular: Negative.   Gastrointestinal: Negative.   Genitourinary: Positive for dysuria, frequency  and urgency.       Recent UTI symptoms and treated with antibiotics.... symptoms now gone  Musculoskeletal: Positive for joint pain (both knees).  Skin: Negative.   Neurological: Negative.   Endo/Heme/Allergies: Negative.   Psychiatric/Behavioral: Negative.     Objective:  Physical Exam  Constitutional: He is oriented to person, place, and time. He appears well-developed and well-nourished.  HENT:  Head: Normocephalic and atraumatic.  Eyes: Pupils are equal, round, and reactive to light. Conjunctivae and EOM are normal.  Neck: Normal range of motion. Neck supple. No thyromegaly present.  Cardiovascular: Normal rate, regular rhythm and normal heart sounds.  No murmur heard. Respiratory: Effort normal and breath sounds normal. No respiratory distress. He has no wheezes.  GI: Soft. Bowel sounds are normal. He exhibits no distension. There is no abdominal tenderness.  Neurological: He is alert and oriented to person, place, and time.  Skin: Skin is warm and dry.  Psychiatric: He has a normal mood and affect. His behavior is normal. Judgment and thought content normal.  Musculoskeletal:  Mild effusion without warmth.  No incisions noted.  Crepitous with ROM. Varus deformity.    Vital signs in last 24 hours: Temp:  [97 F (36.1 C)] 97 F (36.1 C) (10/06 0800) Pulse  Rate:  [75] 75 (10/06 0800) Resp:  [18] 18 (10/06 0800) BP: (142)/(77) 142/77 (10/06 0800) Weight:  [97 kg] 97 kg (10/06 0800)  Labs:   Estimated body mass index is 33.49 kg/m as calculated from the following:   Height as of 04/20/19: 5\' 7"  (1.702 m).   Weight as of 04/20/19: 97 kg.   Imaging Review Plain radiographs demonstrate severe degenerative joint disease of the left knee(s). The overall alignment issignificant varus. The bone quality appears to be good for age and reported activity level.      Assessment/Plan:  End stage arthritis, left knee   The patient history, physical examination, clinical judgment of the provider and imaging studies are consistent with end stage degenerative joint disease of the left knee(s) and total knee arthroplasty is deemed medically necessary. The treatment options including medical management, injection therapy arthroscopy and arthroplasty were discussed at length. The risks and benefits of total knee arthroplasty were presented and reviewed. The risks due to aseptic loosening, infection, stiffness, patella tracking problems, thromboembolic complications and other imponderables were discussed. The patient acknowledged the explanation, agreed to proceed with the plan and consent was signed. Patient is being admitted for inpatient treatment for surgery, pain control, PT, OT, prophylactic antibiotics, VTE prophylaxis, progressive ambulation and ADL's and discharge planning. The patient is planning to be discharged home with home health services     Patient's anticipated LOS is less than 2 midnights, meeting these requirements: - Younger than 15 - Lives within 1 hour of care - Has a competent adult at home to recover with post-op recover - NO history of  - Chronic pain requiring opiods  - Diabetes  - Coronary Artery Disease  - Heart failure  - Heart attack  - Stroke  - DVT/VTE  - Cardiac arrhythmia  - Respiratory Failure/COPD  - Renal  failure  - Anemia  - Advanced Liver disease  Mike Craze. Micheal Likens [id number]  04/20/2019 8:59 AM

## 2019-04-21 ENCOUNTER — Encounter: Payer: Self-pay | Admitting: Family Medicine

## 2019-04-26 ENCOUNTER — Encounter (INDEPENDENT_AMBULATORY_CARE_PROVIDER_SITE_OTHER): Payer: Self-pay | Admitting: Orthopaedic Surgery

## 2019-04-27 DIAGNOSIS — R3129 Other microscopic hematuria: Secondary | ICD-10-CM | POA: Diagnosis not present

## 2019-04-28 ENCOUNTER — Encounter (INDEPENDENT_AMBULATORY_CARE_PROVIDER_SITE_OTHER): Payer: Self-pay | Admitting: Orthopaedic Surgery

## 2019-04-29 ENCOUNTER — Telehealth: Payer: Self-pay | Admitting: Orthopaedic Surgery

## 2019-04-29 ENCOUNTER — Encounter (INDEPENDENT_AMBULATORY_CARE_PROVIDER_SITE_OTHER): Payer: Self-pay | Admitting: Orthopaedic Surgery

## 2019-04-29 NOTE — Patient Instructions (Addendum)
DUE TO COVID-19 ONLY ONE VISITOR IS ALLOWED TO COME WITH YOU AND STAY IN THE WAITING ROOM ONLY DURING PRE OP AND PROCEDURE DAY OF SURGERY. THE 1 VISITOR MAY VISIT WITH YOU AFTER SURGERY IN YOUR PRIVATE ROOM DURING VISITING HOURS ONLY!   ONCE YOUR COVID TEST IS COMPLETED, PLEASE BEGIN THE QUARANTINE INSTRUCTIONS AS OUTLINED IN YOUR HANDOUT.                Charles Trujillo    Your procedure is scheduled on: Tuesday 05/04/2019   Report to [facility] Main  Entrance    Report to Short Stay at 5:30am.     Call this number if you have problems the morning of surgery [phone]    Remember: Do not eat food after Midnight.     BRUSH YOUR TEETH MORNING OF SURGERY AND RINSE YOUR MOUTH OUT, NO CHEWING GUM CANDY OR MINTS.   NO SOLID FOOD AFTER MIDNIGHT THE NIGHT PRIOR TO SURGERY. NOTHING BY MOUTH EXCEPT CLEAR LIQUIDS UNTIL 4:15am.     PLEASE FINISH ENSURE DRINK PER SURGEON ORDER  WHICH NEEDS TO BE COMPLETED AT 4:15am .   CLEAR LIQUID DIET   Foods Allowed                                                                     Foods Excluded  Coffee and tea, regular and decaf                             liquids that you cannot  Plain Jell-O any favor except red or purple              see through such as: Fruit ices (not with fruit pulp)                                     milk, soups, orange juice  Iced Popsicles                                    All solid food Carbonated beverages, regular and diet                                    Cranberry, grape and apple juices Sports drinks like Gatorade Lightly seasoned clear broth or consume(fat free) Sugar, honey syrup  Sample Menu Breakfast                                Lunch                                     Supper Cranberry juice                    Beef broth  Chicken broth Jell-O                                     Grape juice                           Apple juice Coffee or tea                         Jell-O                                      Popsicle                                                Coffee or tea                        Coffee or tea  _____________________________________________________________________      Take these medicines the morning of surgery with A SIP OF WATER: Lipitor, Metoprolol, Dilitiazem, Myrebetriq   DO NOT TAKE ANY DIABETIC MEDICATIONS DAY OF YOUR SURGERY!  How to Manage Your Diabetes Before and After Surgery  Why is it important to control my blood sugar before and after surgery? . Improving blood sugar levels before and after surgery helps healing and can limit problems. . A way of improving blood sugar control is eating a healthy diet by: o  Eating less sugar and carbohydrates o  Increasing activity/exercise o  Talking with your doctor about reaching your blood sugar goals . High blood sugars (greater than 180 mg/dL) can raise your risk of infections and slow your recovery, so you will need to focus on controlling your diabetes during the weeks before surgery. . Make sure that the doctor who takes care of your diabetes knows about your planned surgery including the date and location.  How do I manage my blood sugar before surgery? . Check your blood sugar at least 4 times a day, starting 2 days before surgery, to make sure that the level is not too high or low. o Check your blood sugar the morning of your surgery when you wake up and every 2 hours until you get to the Short Stay unit. . If your blood sugar is less than 70 mg/dL, you will need to treat for low blood sugar: o Do not take insulin. o Treat a low blood sugar (less than 70 mg/dL) with  cup of clear juice (cranberry or apple), 4 glucose tablets, OR glucose gel. o Recheck blood sugar in 15 minutes after treatment (to make sure it is greater than 70 mg/dL). If your blood sugar is not greater than 70 mg/dL on recheck, call [phone] for further instructions. . Report your blood sugar  to the short stay nurse when you get to Short Stay.  . If you are admitted to the hospital after surgery: o Your blood sugar will be checked by the staff and you will probably be given insulin after surgery (instead of oral diabetes medicines) to make sure you have good blood sugar levels. o The goal for blood sugar control after surgery is 80-180 mg/dL.  WHAT DO I DO ABOUT MY DIABETES MEDICATION?  The day before surgery, Take Janumet as usual.  . Do not take oral diabetes medicines (pills) the morning of surgery.                 You may not have any metal on your body including hair pins and              piercings  Do not wear jewelry, make-up, lotions, powders or perfumes, deodorant                          Men may shave face and neck.   Do not bring valuables to the hospital. [place].  Contacts, dentures or bridgework may not be worn into surgery.  Leave suitcase in the car. After surgery it may be brought to your room.                 Please read over the following fact sheets you were given: _____________________________________________________________________             [facility] - Preparing for Surgery Before surgery, you can play an important role.  Because skin is not sterile, your skin needs to be as free of germs as possible.  You can reduce the number of germs on your skin by washing with CHG (chlorahexidine gluconate) soap before surgery.  CHG is an antiseptic cleaner which kills germs and bonds with the skin to continue killing germs even after washing. Please DO NOT use if you have an allergy to CHG or antibacterial soaps.  If your skin becomes reddened/irritated stop using the CHG and inform your nurse when you arrive at Short Stay. Do not shave (including legs and underarms) for at least 48 hours prior to the first CHG shower.  You may shave your face/neck. Please follow these instructions carefully:  1.   Shower with CHG Soap the night before surgery and the  morning of Surgery.  2.  If you choose to wash your hair, wash your hair first as usual with your  normal  shampoo.  3.  After you shampoo, rinse your hair and body thoroughly to remove the  shampoo.                            4.  Use CHG as you would any other liquid soap.  You can apply chg directly  to the skin and wash                       Gently with a scrungie or clean washcloth.  5.  Apply the CHG Soap to your body ONLY FROM THE NECK DOWN.   Do not use on face/ open                           Wound or open sores. Avoid contact with eyes, ears mouth and genitals (private parts).                       Wash face,  Genitals (private parts) with your normal soap.             6.  Wash thoroughly, paying special attention to the area where your  surgery  will be performed.  7.  Thoroughly rinse your body with warm water from the neck down.  8.  DO NOT shower/wash with your normal soap after using and rinsing off  the CHG Soap.                9.  Pat yourself dry with a clean towel.            10.  Wear clean pajamas.            11.  Place clean sheets on your bed the night of your first shower and do not  sleep with pets. Day of Surgery : Do not apply any lotions/deodorants the morning of surgery.  Please wear clean clothes to the hospital/surgery center.  FAILURE TO FOLLOW THESE INSTRUCTIONS MAY RESULT IN THE CANCELLATION OF YOUR SURGERY PATIENT SIGNATURE_________________________________  NURSE SIGNATURE__________________________________  ________________________________________________________________________   Adam Phenix  An incentive spirometer is a tool that can help keep your lungs clear and active. This tool measures how well you are filling your lungs with each breath. Taking long deep breaths may help reverse or decrease the chance of developing breathing (pulmonary) problems (especially infection) following:  A  long period of time when you are unable to move or be active. BEFORE THE PROCEDURE   If the spirometer includes an indicator to show your best effort, your nurse or respiratory therapist will set it to a desired goal.  If possible, sit up straight or lean slightly forward. Try not to slouch.  Hold the incentive spirometer in an upright position. INSTRUCTIONS FOR USE  1. Sit on the edge of your bed if possible, or sit up as far as you can in bed or on a chair. 2. Hold the incentive spirometer in an upright position. 3. Breathe out normally. 4. Place the mouthpiece in your mouth and seal your lips tightly around it. 5. Breathe in slowly and as deeply as possible, raising the piston or the ball toward the top of the column. 6. Hold your breath for 3-5 seconds or for as long as possible. Allow the piston or ball to fall to the bottom of the column. 7. Remove the mouthpiece from your mouth and breathe out normally. 8. Rest for a few seconds and repeat Steps 1 through 7 at least 10 times every 1-2 hours when you are awake. Take your time and take a few normal breaths between deep breaths. 9. The spirometer may include an indicator to show your best effort. Use the indicator as a goal to work toward during each repetition. 10. After each set of 10 deep breaths, practice coughing to be sure your lungs are clear. If you have an incision (the cut made at the time of surgery), support your incision when coughing by placing a pillow or rolled up towels firmly against it. Once you are able to get out of bed, walk around indoors and cough well. You may stop using the incentive spirometer when instructed by your caregiver.  RISKS AND COMPLICATIONS  Take your time so you do not get dizzy or light-headed.  If you are in pain, you may need to take or ask for pain medication before doing incentive spirometry. It is harder to take a deep breath if you are having pain. AFTER USE  Rest and breathe slowly and  easily.  It can be helpful to keep track of a log of your progress. Your caregiver can provide you with a simple table to  help with this. If you are using the spirometer at home, follow these instructions: [place] IF:   You are having difficultly using the spirometer.  You have trouble using the spirometer as often as instructed.  Your pain medication is not giving enough relief while using the spirometer.  You develop fever of 100.5 F (38.1 C) or higher. SEEK IMMEDIATE MEDICAL CARE IF:   You cough up bloody sputum that had not been present before.  You develop fever of 102 F (38.9 C) or greater.  You develop worsening pain at or near the incision site. MAKE SURE YOU:   Understand these instructions.  Will watch your condition.  Will get help right away if you are not doing well or get worse. Document Released: 11/11/2006 Document Revised: 09/23/2011 Document Reviewed: 01/12/2007 [facility] Patient Information 2014 [place], [place].   WHAT IS A BLOOD TRANSFUSION? Blood Transfusion Information  A transfusion is the replacement of blood or some of its parts. Blood is made up of multiple cells which provide different functions.  Red blood cells carry oxygen and are used for blood loss replacement.  White blood cells fight against infection.  Platelets control bleeding.  Plasma helps clot blood.  Other blood products are available for specialized needs, such as hemophilia or other clotting disorders. BEFORE THE TRANSFUSION  Who gives blood for transfusions?   Healthy volunteers who are fully evaluated to make sure their blood is safe. This is blood bank blood. Transfusion therapy is the safest it has ever been in the practice of medicine. Before blood is taken from a donor, a complete history is taken to make sure that person has no history of diseases nor engages in risky social behavior (examples are intravenous drug use or sexual activity with multiple  partners). The donor's travel history is screened to minimize risk of transmitting infections, such as malaria. The donated blood is tested for signs of infectious diseases, such as HIV and hepatitis. The blood is then tested to be sure it is compatible with you in order to minimize the chance of a transfusion reaction. If you or a relative donates blood, this is often done in anticipation of surgery and is not appropriate for emergency situations. It takes many days to process the donated blood. RISKS AND COMPLICATIONS Although transfusion therapy is very safe and saves many lives, the main dangers of transfusion include:   Getting an infectious disease.  Developing a transfusion reaction. This is an allergic reaction to something in the blood you were given. Every precaution is taken to prevent this. The decision to have a blood transfusion has been considered carefully by your caregiver before blood is given. Blood is not given unless the benefits outweigh the risks. AFTER THE TRANSFUSION  Right after receiving a blood transfusion, you will usually feel much better and more energetic. This is especially true if your red blood cells have gotten low (anemic). The transfusion raises the level of the red blood cells which carry oxygen, and this usually causes an energy increase.  The nurse administering the transfusion will monitor you carefully for complications. HOME CARE INSTRUCTIONS  No special instructions are needed after a transfusion. You may find your energy is better. Speak with your caregiver about any limitations on activity for underlying diseases you may have. SEEK MEDICAL CARE IF:   Your condition is not improving after your transfusion.  You develop redness or irritation at the intravenous (IV) site. SEEK IMMEDIATE MEDICAL CARE IF:  Any of  the following symptoms occur over the next 12 hours:  Shaking chills.  You have a temperature by mouth above 102 F (38.9 C), not  controlled by medicine.  Chest, back, or muscle pain.  People around you feel you are not acting correctly or are confused.  Shortness of breath or difficulty breathing.  Dizziness and fainting.  You get a rash or develop hives.  You have a decrease in urine output.  Your urine turns a dark color or changes to pink, red, or brown. Any of the following symptoms occur over the next 10 days:  You have a temperature by mouth above 102 F (38.9 C), not controlled by medicine.  Shortness of breath.  Weakness after normal activity.  The white part of the eye turns yellow (jaundice).  You have a decrease in the amount of urine or are urinating less often.  Your urine turns a dark color or changes to pink, red, or brown. Document Released: 06/28/2000 Document Revised: 09/23/2011 Document Reviewed: 02/15/2008 [facility] Patient Information 2014 [place], [place].  _______________________________________________________________________

## 2019-04-29 NOTE — Telephone Encounter (Signed)
Patient called and left message on my voice mail stating CT scan came back negative from [facility]. Everything is in place for surgery 05-04-19. Thanks

## 2019-04-29 NOTE — Telephone Encounter (Signed)
thanks

## 2019-04-30 ENCOUNTER — Encounter (HOSPITAL_COMMUNITY): Payer: Self-pay

## 2019-04-30 ENCOUNTER — Encounter (HOSPITAL_COMMUNITY)
Admission: RE | Admit: 2019-04-30 | Discharge: 2019-04-30 | Disposition: A | Discharge status: Home / Self Care | Payer: PRIVATE HEALTH INSURANCE | Source: Ambulatory Visit | Attending: Orthopaedic Surgery | Admitting: Orthopaedic Surgery

## 2019-04-30 ENCOUNTER — Other Ambulatory Visit (HOSPITAL_COMMUNITY)
Admission: RE | Admit: 2019-04-30 | Discharge: 2019-04-30 | Disposition: A | Discharge status: Home / Self Care | Payer: PRIVATE HEALTH INSURANCE | Source: Ambulatory Visit | Attending: Orthopaedic Surgery | Admitting: Orthopaedic Surgery

## 2019-04-30 ENCOUNTER — Other Ambulatory Visit: Payer: Self-pay

## 2019-04-30 ENCOUNTER — Ambulatory Visit (HOSPITAL_COMMUNITY)
Admission: RE | Admit: 2019-04-30 | Discharge: 2019-04-30 | Disposition: A | Discharge status: Home / Self Care | Payer: PRIVATE HEALTH INSURANCE | Source: Ambulatory Visit | Attending: Orthopedic Surgery | Admitting: Orthopedic Surgery

## 2019-04-30 DIAGNOSIS — Z01818 Encounter for other preprocedural examination: Secondary | ICD-10-CM

## 2019-04-30 LAB — URINALYSIS, ROUTINE W REFLEX MICROSCOPIC
Bilirubin Urine: NEGATIVE
Glucose, UA: NEGATIVE mg/dL
Ketones, ur: NEGATIVE mg/dL
Nitrite: NEGATIVE
Protein, ur: 100 mg/dL — AB
Specific Gravity, Urine: 1.02 (ref 1.005–1.030)
WBC, UA: >50 WBC/hpf — ABNORMAL HIGH (ref 0–5)
pH: 5 (ref 5.0–8.0)

## 2019-04-30 LAB — PROTIME-INR
INR: 1.1 (ref 0.8–1.2)
Prothrombin Time: 14.1 seconds (ref 11.4–15.2)

## 2019-04-30 LAB — HEMOGLOBIN A1C
Hgb A1c MFr Bld: 6.7 % — ABNORMAL HIGH (ref 4.8–5.6)
Mean Plasma Glucose: 145.59 mg/dL

## 2019-04-30 LAB — BASIC METABOLIC PANEL
Anion gap: 8 (ref 5–15)
BUN: 17 mg/dL (ref 8–23)
CO2: 25 mmol/L (ref 22–32)
Calcium: 8.8 mg/dL — ABNORMAL LOW (ref 8.9–10.3)
Chloride: 104 mmol/L (ref 98–111)
Creatinine, Ser: 1.56 mg/dL — ABNORMAL HIGH (ref 0.61–1.24)
GFR calc Af Amer: 52 mL/min — ABNORMAL LOW (ref >60)
GFR calc non Af Amer: 45 mL/min — ABNORMAL LOW (ref >60)
Glucose, Bld: 144 mg/dL — ABNORMAL HIGH (ref 70–99)
Potassium: 5 mmol/L (ref 3.5–5.1)
Sodium: 137 mmol/L (ref 135–145)

## 2019-04-30 LAB — SURGICAL PCR SCREEN
MRSA, PCR: NEGATIVE
Staphylococcus aureus: NEGATIVE

## 2019-04-30 LAB — CBC WITH DIFFERENTIAL/PLATELET
Abs Immature Granulocytes: 0.02 10*3/uL (ref 0.00–0.07)
Basophils Absolute: 0.1 10*3/uL (ref 0.0–0.1)
Basophils Relative: 2 %
Eosinophils Absolute: 0.5 10*3/uL (ref 0.0–0.5)
Eosinophils Relative: 8 %
HCT: 49.2 % (ref 39.0–52.0)
Hemoglobin: 15.6 g/dL (ref 13.0–17.0)
Immature Granulocytes: 0 %
Lymphocytes Relative: 22 %
Lymphs Abs: 1.4 10*3/uL (ref 0.7–4.0)
MCH: 31.1 pg (ref 26.0–34.0)
MCHC: 31.7 g/dL (ref 30.0–36.0)
MCV: 98 fL (ref 80.0–100.0)
Monocytes Absolute: 0.6 10*3/uL (ref 0.1–1.0)
Monocytes Relative: 9 %
Neutro Abs: 3.8 10*3/uL (ref 1.7–7.7)
Neutrophils Relative %: 59 %
Platelets: 266 10*3/uL (ref 150–400)
RBC: 5.02 MIL/uL (ref 4.22–5.81)
RDW: 12.8 % (ref 11.5–15.5)
WBC: 6.3 10*3/uL (ref 4.0–10.5)
nRBC: 0 % (ref 0.0–0.2)

## 2019-04-30 LAB — COMPREHENSIVE METABOLIC PANEL
ALT: 58 U/L — ABNORMAL HIGH (ref 0–44)
AST: 46 U/L — ABNORMAL HIGH (ref 15–41)
Albumin: 4.3 g/dL (ref 3.5–5.0)
Alkaline Phosphatase: 48 U/L (ref 38–126)
Anion gap: 8 (ref 5–15)
BUN: 17 mg/dL (ref 8–23)
CO2: 24 mmol/L (ref 22–32)
Calcium: 9.1 mg/dL (ref 8.9–10.3)
Chloride: 106 mmol/L (ref 98–111)
Creatinine, Ser: 1.34 mg/dL — ABNORMAL HIGH (ref 0.61–1.24)
GFR calc Af Amer: >60 mL/min (ref >60)
GFR calc non Af Amer: 54 mL/min — ABNORMAL LOW (ref >60)
Glucose, Bld: 136 mg/dL — ABNORMAL HIGH (ref 70–99)
Potassium: 5.5 mmol/L — ABNORMAL HIGH (ref 3.5–5.1)
Sodium: 138 mmol/L (ref 135–145)
Total Bilirubin: 1 mg/dL (ref 0.3–1.2)
Total Protein: 7.3 g/dL (ref 6.5–8.1)

## 2019-04-30 LAB — GLUCOSE, CAPILLARY: Glucose-Capillary: 154 mg/dL — ABNORMAL HIGH (ref 70–99)

## 2019-04-30 LAB — APTT: aPTT: 28 seconds (ref 24–36)

## 2019-04-30 LAB — ABO/RH: ABO/RH(D): O POS

## 2019-04-30 NOTE — Progress Notes (Signed)
Received a call from Wendy Poet, [place] for Dr. Durward Fortes requesting that patient come back to have labs-BMP redrawn again as Potassium was elevated. Spoke with patient and patient is coming back for lab work to be drawn.

## 2019-04-30 NOTE — Progress Notes (Signed)
PCP - Dr. Roma Schanz Cardiologist - 2019-Dr.Frederic  Khal [facility]--was seen for tachycardia  Chest x-ray -04/30/2019  EKG - 04/30/2019 Stress Test - 04/21/2012 ECHO - 03/11/2012, 01/22/2013-[facility] care everywhere 10/19/2012-Wore holter monitor  Cardiac Cath - none  Sleep Study -none  CPAP - none  Fasting Blood Sugar - 114-135 Checks Blood Sugar __1___ times a week  Blood Thinner Instructions:none Aspirin Instructions:none Last Dose:  Anesthesia review:   Chart given to Konrad Felix, PA for review  Patient has history of DM, HTN, Tachycardia, Elevated LFT's Patient denies shortness of breath, fever, cough and chest pain at PAT appointment   Patient verbalized understanding of instructions that were given to them at the PAT appointment. Patient was also instructed that they will need to review over the PAT instructions again at home before surgery.

## 2019-05-01 LAB — NOVEL CORONAVIRUS, NAA (HOSP ORDER, SEND-OUT TO REF LAB; TAT 18-24 HRS): SARS-CoV-2, NAA: NOT DETECTED

## 2019-05-01 LAB — URINE CULTURE: Culture: NO GROWTH

## 2019-05-03 MED ORDER — TRANEXAMIC ACID 1000 MG/10ML IV SOLN
2000.0000 mg | INTRAVENOUS | Status: DC
  Filled 2019-05-03: qty 20

## 2019-05-03 NOTE — Progress Notes (Signed)
Anesthesia Chart Review   Case: Charles Trujillo Date/Time: 05/04/19 0700   Procedure: LEFT TOTAL KNEE ARTHROPLASTY (Left Knee)   Anesthesia type: Choice   Pre-op diagnosis: osteoarthritis left knee   Location[facility] ROOM 04 / [facility]   Surgeon: Garald Balding, MD      DISCUSSION:68 y.o. curent some day smoker with h/o HTN, DM II, HLD, GERD, left knee OA scheduled for above procedure 05/04/2019 with Dr. [name].   Normal stress test 03/17/12. Last seen by cardiologist, Dr. Haze Rushing, 06/09/18 for unexplained tacyhcardia which resolved with metoprolol.  1 year follow up recommended.   Anticipate pt can proceed with planned procedure barring acute status change.   VS: BP 123/78   Pulse 80   Temp 37.1 C (Oral)   Resp 18   Ht 5\' 8"  (1.727 m)   Wt 94.8 kg   SpO2 97%   BMI 31.78 kg/m   PROVIDERS: [name], DO is PCP   Howell Rucks, MD LABS: Repeat Potassium 5.0 (all labs ordered are listed, but only abnormal results are displayed)  Labs Reviewed  GLUCOSE, CAPILLARY - Abnormal; Notable for the following components:      Result Value   Glucose-Capillary 154 (*)    All other components within normal limits  COMPREHENSIVE METABOLIC PANEL - Abnormal; Notable for the following components:   Potassium 5.5 (*)    Glucose, Bld 136 (*)    Creatinine, Ser 1.34 (*)    AST 46 (*)    ALT 58 (*)    GFR calc non Af Amer 54 (*)    All other components within normal limits  URINALYSIS, ROUTINE W REFLEX MICROSCOPIC - Abnormal; Notable for the following components:   APPearance HAZY (*)    Hgb urine dipstick MODERATE (*)    Protein, ur 100 (*)    Leukocytes,Ua SMALL (*)    WBC, UA >50 (*)    Bacteria, UA RARE (*)    All other components within normal limits  HEMOGLOBIN A1C - Abnormal; Notable for the following components:   Hgb A1c MFr Bld 6.7 (*)    All other components within normal limits  BASIC METABOLIC PANEL - Abnormal; Notable for the following components:   Glucose, Bld 144 (*)    Creatinine, Ser 1.56 (*)    Calcium 8.8 (*)    GFR calc non Af Amer 45 (*)    GFR calc Af Amer 52 (*)    All other components within normal limits  SURGICAL PCR SCREEN  URINE CULTURE  APTT  CBC WITH DIFFERENTIAL/PLATELET  PROTIME-INR  TYPE AND SCREEN  ABO/RH     IMAGES: Chest Xray 04/30/2019 FINDINGS: The heart size and mediastinal contours are within normal limits. Both lungs are clear. No pleural effusion or pneumothorax. The visualized skeletal structures are unremarkable.  IMPRESSION: No active cardiopulmonary disease.  EKG: 04/30/2019 Rate 72 bpm Normal Sinus rhythm  Low voltage QRS Borderline ECG No significant change since last tracing   CV: Echo 10/23/2012 SUMMARY There is no comparison study available. The left ventricular size is normal. Basal left ventricular septal hypertrophy  Left ventricular systolic function is normal.  Left ventricular filling pattern is impaired. The right ventricle is normal in size and function. No significant valvular stenosis or regurgitation. The aortic root is normal size. There is no pericardial effusion. - FINDINGS:  LEFT VENTRICLE The left ventricular size is normal. Basal left ventricular septal  hypertrophy. Left ventricular systolic function is normal. LV ejection  fraction =  55-60%. Left ventricular filling pattern is impaired. The left  ventricular wall motion is normal. Past Medical History:  Diagnosis Date  . Anxiety   . Arthritis    bilateral knees  . Depression   . DM2 (diabetes mellitus, type 2) ([place])   . Dysrhythmia     benign tachycardia, takes [place]  . Elevated LFTs    previously evaluated by Dr Sharlett Iles; h/o "fatty liver"  . GERD (gastroesophageal reflux disease)   . HLD (hyperlipidemia)   . Hypertension   . Inguinal hernia    right  . Squamous cell carcinoma    top of head    Past Surgical History:  Procedure Laterality Date  . ACHILLES TENDON REPAIR  1999   . CARPAL TUNNEL RELEASE Right 11/04/2014   Procedure: LIMITED OPEN RIGHT CARPAL TUNNEL RELEASE;  Surgeon: Roseanne Kaufman, MD;  Location: [place];  Service: Orthopedics;  Laterality: Right;  . CARPAL TUNNEL WITH CUBITAL TUNNEL Left 07/22/2014   Procedure: LEFT CARPAL TUNNEL RELEASE AND CUBITAL TUNNEL RELEASE TRANSPOSITION;  Surgeon: Roseanne Kaufman, MD;  Location: [place];  Service: Orthopedics;  Laterality: Left;  . COLONOSCOPY    . HERNIA REPAIR  07/2011   left side  . INGUINAL HERNIA REPAIR  08/09/2011   Procedure: LAPAROSCOPIC INGUINAL HERNIA;  Surgeon: Joyice Faster. Cornett, MD;  [facility];  Service: General;  Laterality: Right;  . KNEE ARTHROSCOPY  1993   left knee  . KNEE SURGERY     age 56- right  . SQUAMOUS CELL CARCINOMA EXCISION     left knee    MEDICATIONS: . atorvastatin (LIPITOR) 20 MG tablet  . celecoxib (CELEBREX) 200 MG capsule  . diltiazem (CARDIZEM CD) 120 MG 24 hr capsule  . glucose blood (ONE TOUCH TEST STRIPS) test strip  . glucose blood test strip  . ibuprofen (ADVIL,MOTRIN) 200 MG tablet  . JANUMET XR [phone] MG TB24  . lisinopril (PRINIVIL,ZESTRIL) 10 MG tablet  . metoprolol (LOPRESSOR) 50 MG tablet  . MYRBETRIQ 25 MG TB24 tablet  . ONETOUCH DELICA LANCETS FINE MISC  . sildenafil (VIAGRA) 100 MG tablet   No current facility-administered medications for this encounter.    Derrill Memo ON 05/04/2019] tranexamic acid (CYKLOKAPRON) 2,000 mg in sodium chloride 0.9 % 50 mL Topical Application    Maia Plan [facility] Pre-Surgical Testing [phone] 05/03/19  1:18 PM

## 2019-05-03 NOTE — Anesthesia Preprocedure Evaluation (Addendum)
Anesthesia Evaluation  Patient identified by MRN, date of birth, ID band Patient awake    Reviewed: Allergy & Precautions, NPO status , Patient's Chart, lab work & pertinent test results, reviewed documented beta blocker date and time   Airway Mallampati: II   Neck ROM: full    Dental  (+) Dental Advisory Given,    Pulmonary Current Smoker,  Smokes cigars occasionally   breath sounds clear to auscultation       Cardiovascular hypertension, Pt. on medications and Pt. on home beta blockers  Rhythm:regular Rate:Normal     Neuro/Psych Anxiety Depression negative neurological ROS     GI/Hepatic Neg liver ROS, GERD  Medicated and Controlled,Hx fatty liver   Endo/Other  diabetes, Type 2, Oral Hypoglycemic Agentsobese  Renal/GU negative Renal ROS  negative genitourinary   Musculoskeletal  (+) Arthritis , Osteoarthritis,    Abdominal Normal abdominal exam  (+)   Peds  Hematology negative hematology ROS (+)   Anesthesia Other Findings Per patient, does not like percocet or lortab. In the past, PO dilaudid postop has worked well for him. Communicated this to surgical team.  Reproductive/Obstetrics negative OB ROS                           Anesthesia Physical  Anesthesia Plan  ASA: III  Anesthesia Plan: MAC, Regional and Spinal   Post-op Pain Management:  Regional for Post-op pain   Induction:   PONV Risk Score and Plan: Propofol infusion and TIVA  Airway Management Planned: Natural Airway and Nasal Cannula  Additional Equipment: None  Intra-op Plan:   Post-operative Plan:   Informed Consent: I have reviewed the patients History and Physical, chart, labs and discussed the procedure including the risks, benefits and alternatives for the proposed anesthesia with the patient or authorized representative who has indicated his/her understanding and acceptance.       Plan Discussed with:  CRNA  Anesthesia Plan Comments:         Anesthesia Quick Evaluation

## 2019-05-04 ENCOUNTER — Inpatient Hospital Stay (HOSPITAL_COMMUNITY): Payer: PRIVATE HEALTH INSURANCE | Admitting: Certified Registered Nurse Anesthetist

## 2019-05-04 ENCOUNTER — Other Ambulatory Visit: Payer: Self-pay

## 2019-05-04 ENCOUNTER — Inpatient Hospital Stay (HOSPITAL_COMMUNITY)
Admission: RE | Admit: 2019-05-04 | Discharge: 2019-05-05 | DRG: 470 | Disposition: A | Discharge status: Home Health | Payer: PRIVATE HEALTH INSURANCE | Source: Ambulatory Visit | Attending: Orthopaedic Surgery | Admitting: Orthopaedic Surgery

## 2019-05-04 ENCOUNTER — Encounter (HOSPITAL_COMMUNITY)
Admission: RE | Disposition: A | Discharge status: Home Health | Payer: Self-pay | Source: Ambulatory Visit | Attending: Orthopaedic Surgery

## 2019-05-04 ENCOUNTER — Inpatient Hospital Stay (HOSPITAL_COMMUNITY): Payer: PRIVATE HEALTH INSURANCE | Admitting: Physician Assistant

## 2019-05-04 ENCOUNTER — Encounter (HOSPITAL_COMMUNITY): Payer: Self-pay | Admitting: Emergency Medicine

## 2019-05-04 DIAGNOSIS — I10 Essential (primary) hypertension: Secondary | ICD-10-CM | POA: Diagnosis present

## 2019-05-04 DIAGNOSIS — F17290 Nicotine dependence, other tobacco product, uncomplicated: Secondary | ICD-10-CM | POA: Diagnosis present

## 2019-05-04 DIAGNOSIS — M25462 Effusion, left knee: Secondary | ICD-10-CM | POA: Diagnosis present

## 2019-05-04 DIAGNOSIS — Z8249 Family history of ischemic heart disease and other diseases of the circulatory system: Secondary | ICD-10-CM | POA: Diagnosis not present

## 2019-05-04 DIAGNOSIS — R300 Dysuria: Secondary | ICD-10-CM | POA: Diagnosis present

## 2019-05-04 DIAGNOSIS — K219 Gastro-esophageal reflux disease without esophagitis: Secondary | ICD-10-CM | POA: Diagnosis present

## 2019-05-04 DIAGNOSIS — M659 Synovitis and tenosynovitis, unspecified: Secondary | ICD-10-CM | POA: Diagnosis present

## 2019-05-04 DIAGNOSIS — N529 Male erectile dysfunction, unspecified: Secondary | ICD-10-CM | POA: Diagnosis present

## 2019-05-04 DIAGNOSIS — Z79899 Other long term (current) drug therapy: Secondary | ICD-10-CM | POA: Diagnosis not present

## 2019-05-04 DIAGNOSIS — Z85828 Personal history of other malignant neoplasm of skin: Secondary | ICD-10-CM | POA: Diagnosis not present

## 2019-05-04 DIAGNOSIS — Z833 Family history of diabetes mellitus: Secondary | ICD-10-CM

## 2019-05-04 DIAGNOSIS — E785 Hyperlipidemia, unspecified: Secondary | ICD-10-CM | POA: Diagnosis present

## 2019-05-04 DIAGNOSIS — Z885 Allergy status to narcotic agent status: Secondary | ICD-10-CM

## 2019-05-04 DIAGNOSIS — Z7984 Long term (current) use of oral hypoglycemic drugs: Secondary | ICD-10-CM

## 2019-05-04 DIAGNOSIS — K760 Fatty (change of) liver, not elsewhere classified: Secondary | ICD-10-CM | POA: Diagnosis present

## 2019-05-04 DIAGNOSIS — E1151 Type 2 diabetes mellitus with diabetic peripheral angiopathy without gangrene: Secondary | ICD-10-CM

## 2019-05-04 DIAGNOSIS — IMO0002 Reserved for concepts with insufficient information to code with codable children: Secondary | ICD-10-CM

## 2019-05-04 DIAGNOSIS — M1990 Unspecified osteoarthritis, unspecified site: Secondary | ICD-10-CM | POA: Diagnosis present

## 2019-05-04 DIAGNOSIS — E1165 Type 2 diabetes mellitus with hyperglycemia: Secondary | ICD-10-CM

## 2019-05-04 DIAGNOSIS — E1169 Type 2 diabetes mellitus with other specified complication: Secondary | ICD-10-CM | POA: Diagnosis present

## 2019-05-04 DIAGNOSIS — F329 Major depressive disorder, single episode, unspecified: Secondary | ICD-10-CM | POA: Diagnosis present

## 2019-05-04 DIAGNOSIS — F419 Anxiety disorder, unspecified: Secondary | ICD-10-CM | POA: Diagnosis present

## 2019-05-04 DIAGNOSIS — M25562 Pain in left knee: Secondary | ICD-10-CM | POA: Diagnosis present

## 2019-05-04 DIAGNOSIS — M1712 Unilateral primary osteoarthritis, left knee: Secondary | ICD-10-CM | POA: Diagnosis present

## 2019-05-04 HISTORY — PX: TOTAL KNEE ARTHROPLASTY: SHX125

## 2019-05-04 LAB — TYPE AND SCREEN
ABO/RH(D): O POS
Antibody Screen: NEGATIVE

## 2019-05-04 LAB — GLUCOSE, CAPILLARY
Glucose-Capillary: 125 mg/dL — ABNORMAL HIGH (ref 70–99)
Glucose-Capillary: 141 mg/dL — ABNORMAL HIGH (ref 70–99)
Glucose-Capillary: 173 mg/dL — ABNORMAL HIGH (ref 70–99)
Glucose-Capillary: 243 mg/dL — ABNORMAL HIGH (ref 70–99)
Glucose-Capillary: 226 mg/dL — ABNORMAL HIGH (ref 70–99)

## 2019-05-04 LAB — HEMOGLOBIN A1C
Hgb A1c MFr Bld: 6.6 % — ABNORMAL HIGH (ref 4.8–5.6)
Mean Plasma Glucose: 142.72 mg/dL

## 2019-05-04 SURGERY — ARTHROPLASTY, KNEE, TOTAL
Anesthesia: Monitor Anesthesia Care | Site: Knee | Laterality: Left

## 2019-05-04 MED ORDER — BUPIVACAINE HCL (PF) 0.75 % IJ SOLN
INTRAMUSCULAR | Status: DC | PRN
  Administered 2019-05-04: 1.6 mL via INTRATHECAL

## 2019-05-04 MED ORDER — CELECOXIB 200 MG PO CAPS
200.0000 mg | ORAL_CAPSULE | Freq: Every day | ORAL | Status: DC
  Administered 2019-05-04 – 2019-05-05 (×2): 200 mg via ORAL
  Filled 2019-05-04 (×2): qty 1

## 2019-05-04 MED ORDER — CEFAZOLIN SODIUM-DEXTROSE 2-4 GM/100ML-% IV SOLN
2.0000 g | INTRAVENOUS | Status: AC
  Administered 2019-05-04: 08:00:00 2 g via INTRAVENOUS
  Filled 2019-05-04: qty 100

## 2019-05-04 MED ORDER — METOCLOPRAMIDE HCL 5 MG PO TABS: 5.0000 mg | ORAL_TABLET | Freq: Three times a day (TID) | ORAL | Status: DC | PRN

## 2019-05-04 MED ORDER — MIDAZOLAM HCL 2 MG/2ML IJ SOLN
INTRAMUSCULAR | Status: AC
  Filled 2019-05-04: qty 2

## 2019-05-04 MED ORDER — CHLORHEXIDINE GLUCONATE 4 % EX LIQD: 60.0000 mL | Freq: Once | CUTANEOUS | Status: DC

## 2019-05-04 MED ORDER — PHENYLEPHRINE HCL (PRESSORS) 10 MG/ML IV SOLN
INTRAVENOUS | Status: AC
  Filled 2019-05-04: qty 1

## 2019-05-04 MED ORDER — PROPOFOL 10 MG/ML IV BOLUS
INTRAVENOUS | Status: AC
  Filled 2019-05-04: qty 20

## 2019-05-04 MED ORDER — MAGNESIUM HYDROXIDE 400 MG/5ML PO SUSP: 30.0000 mL | Freq: Every day | ORAL | Status: DC | PRN

## 2019-05-04 MED ORDER — ONDANSETRON HCL 4 MG/2ML IJ SOLN: 4.0000 mg | Freq: Four times a day (QID) | INTRAMUSCULAR | Status: DC | PRN

## 2019-05-04 MED ORDER — LACTATED RINGERS IV SOLN
INTRAVENOUS | Status: DC
  Administered 2019-05-04 (×2): via INTRAVENOUS

## 2019-05-04 MED ORDER — DOCUSATE SODIUM 100 MG PO CAPS
100.0000 mg | ORAL_CAPSULE | Freq: Two times a day (BID) | ORAL | Status: DC
  Administered 2019-05-04 – 2019-05-05 (×2): 100 mg via ORAL
  Filled 2019-05-04 (×2): qty 1

## 2019-05-04 MED ORDER — ALUM & MAG HYDROXIDE-SIMETH 200-200-20 MG/5ML PO SUSP: 30.0000 mL | ORAL | Status: DC | PRN

## 2019-05-04 MED ORDER — LIDOCAINE 2% (20 MG/ML) 5 ML SYRINGE
INTRAMUSCULAR | Status: DC | PRN
  Administered 2019-05-04: 50 mg via INTRAVENOUS

## 2019-05-04 MED ORDER — PROPOFOL 500 MG/50ML IV EMUL
INTRAVENOUS | Status: AC
  Filled 2019-05-04: qty 50

## 2019-05-04 MED ORDER — SODIUM CHLORIDE 0.9 % IV SOLN
INTRAVENOUS | Status: DC | PRN
  Administered 2019-05-04: 40 ug/min via INTRAVENOUS

## 2019-05-04 MED ORDER — HYDROMORPHONE HCL 1 MG/ML IJ SOLN: 0.2500 mg | INTRAMUSCULAR | Status: DC | PRN

## 2019-05-04 MED ORDER — ACETAMINOPHEN 500 MG PO TABS
1000.0000 mg | ORAL_TABLET | Freq: Once | ORAL | Status: AC
  Administered 2019-05-04: 07:00:00 1000 mg via ORAL
  Filled 2019-05-04: qty 2

## 2019-05-04 MED ORDER — BUPIVACAINE HCL 0.5 % IJ SOLN
INTRAMUSCULAR | Status: DC | PRN
  Administered 2019-05-04: 50 mL

## 2019-05-04 MED ORDER — LIDOCAINE 2% (20 MG/ML) 5 ML SYRINGE
INTRAMUSCULAR | Status: AC
  Filled 2019-05-04: qty 5

## 2019-05-04 MED ORDER — DILTIAZEM HCL ER COATED BEADS 120 MG PO CP24
120.0000 mg | ORAL_CAPSULE | Freq: Every day | ORAL | Status: DC
  Filled 2019-05-04: qty 1

## 2019-05-04 MED ORDER — ACETAMINOPHEN 10 MG/ML IV SOLN
1000.0000 mg | Freq: Once | INTRAVENOUS | Status: DC
  Filled 2019-05-04: qty 100

## 2019-05-04 MED ORDER — SODIUM CHLORIDE 0.9 % IR SOLN
Status: DC | PRN
  Administered 2019-05-04: 3000 mL

## 2019-05-04 MED ORDER — BISACODYL 10 MG RE SUPP: 10.0000 mg | Freq: Every day | RECTAL | Status: DC | PRN

## 2019-05-04 MED ORDER — MIRABEGRON ER 25 MG PO TB24
25.0000 mg | ORAL_TABLET | Freq: Every day | ORAL | Status: DC
  Administered 2019-05-05: 09:00:00 25 mg via ORAL
  Filled 2019-05-04: qty 1

## 2019-05-04 MED ORDER — TRANEXAMIC ACID-NACL 1000-0.7 MG/100ML-% IV SOLN
INTRAVENOUS | Status: AC
  Filled 2019-05-04: qty 100

## 2019-05-04 MED ORDER — ONDANSETRON HCL 4 MG/2ML IJ SOLN
INTRAMUSCULAR | Status: DC | PRN
  Administered 2019-05-04: 4 mg via INTRAVENOUS

## 2019-05-04 MED ORDER — POVIDONE-IODINE 10 % EX SWAB
2.0000 "application " | Freq: Once | CUTANEOUS | Status: AC
  Administered 2019-05-04: 2 via TOPICAL

## 2019-05-04 MED ORDER — MIDAZOLAM HCL 5 MG/5ML IJ SOLN
INTRAMUSCULAR | Status: DC | PRN
  Administered 2019-05-04 (×2): 1 mg via INTRAVENOUS

## 2019-05-04 MED ORDER — FENTANYL CITRATE (PF) 100 MCG/2ML IJ SOLN
INTRAMUSCULAR | Status: AC
  Filled 2019-05-04: qty 2

## 2019-05-04 MED ORDER — HYDROMORPHONE HCL 2 MG PO TABS
2.0000 mg | ORAL_TABLET | ORAL | Status: DC | PRN
  Administered 2019-05-04 – 2019-05-05 (×4): 2 mg via ORAL
  Filled 2019-05-04 (×4): qty 1

## 2019-05-04 MED ORDER — CEFAZOLIN SODIUM-DEXTROSE 2-4 GM/100ML-% IV SOLN
2.0000 g | Freq: Four times a day (QID) | INTRAVENOUS | Status: AC
  Administered 2019-05-04 (×2): 2 g via INTRAVENOUS
  Filled 2019-05-04 (×2): qty 100

## 2019-05-04 MED ORDER — ONDANSETRON HCL 4 MG/2ML IJ SOLN
INTRAMUSCULAR | Status: AC
  Filled 2019-05-04: qty 2

## 2019-05-04 MED ORDER — BUPIVACAINE HCL (PF) 0.5 % IJ SOLN
INTRAMUSCULAR | Status: AC
  Filled 2019-05-04: qty 60

## 2019-05-04 MED ORDER — METOCLOPRAMIDE HCL 5 MG/ML IJ SOLN: 5.0000 mg | Freq: Three times a day (TID) | INTRAMUSCULAR | Status: DC | PRN

## 2019-05-04 MED ORDER — SODIUM CHLORIDE 0.9 % IV SOLN: INTRAVENOUS | Status: DC

## 2019-05-04 MED ORDER — DIPHENHYDRAMINE HCL 12.5 MG/5ML PO ELIX: 12.5000 mg | ORAL_SOLUTION | ORAL | Status: DC | PRN

## 2019-05-04 MED ORDER — HYDROMORPHONE HCL 1 MG/ML IJ SOLN: 0.5000 mg | INTRAMUSCULAR | Status: DC | PRN

## 2019-05-04 MED ORDER — METHOCARBAMOL 500 MG IVPB - SIMPLE MED
500.0000 mg | Freq: Four times a day (QID) | INTRAVENOUS | Status: DC | PRN
  Filled 2019-05-04: qty 50

## 2019-05-04 MED ORDER — INSULIN ASPART 100 UNIT/ML ~~LOC~~ SOLN
0.0000 [IU] | Freq: Three times a day (TID) | SUBCUTANEOUS | Status: DC
  Administered 2019-05-04: 3 [IU] via SUBCUTANEOUS
  Administered 2019-05-04: 5 [IU] via SUBCUTANEOUS
  Administered 2019-05-05: 3 [IU] via SUBCUTANEOUS

## 2019-05-04 MED ORDER — METHOCARBAMOL 500 MG PO TABS
500.0000 mg | ORAL_TABLET | Freq: Four times a day (QID) | ORAL | Status: DC | PRN
  Administered 2019-05-04 – 2019-05-05 (×3): 500 mg via ORAL
  Filled 2019-05-04 (×3): qty 1

## 2019-05-04 MED ORDER — PROPOFOL 10 MG/ML IV BOLUS
INTRAVENOUS | Status: DC | PRN
  Administered 2019-05-04 (×2): 10 mg via INTRAVENOUS

## 2019-05-04 MED ORDER — METOPROLOL TARTRATE 50 MG PO TABS
50.0000 mg | ORAL_TABLET | Freq: Two times a day (BID) | ORAL | Status: DC
  Administered 2019-05-04: 50 mg via ORAL
  Filled 2019-05-04 (×2): qty 1

## 2019-05-04 MED ORDER — PROPOFOL 500 MG/50ML IV EMUL
INTRAVENOUS | Status: DC | PRN
  Administered 2019-05-04: 100 ug/kg/min via INTRAVENOUS

## 2019-05-04 MED ORDER — LISINOPRIL 10 MG PO TABS
10.0000 mg | ORAL_TABLET | Freq: Every day | ORAL | Status: DC
  Filled 2019-05-04: qty 1

## 2019-05-04 MED ORDER — PHENOL 1.4 % MT LIQD: 1.0000 | OROMUCOSAL | Status: DC | PRN

## 2019-05-04 MED ORDER — ATORVASTATIN CALCIUM 20 MG PO TABS
20.0000 mg | ORAL_TABLET | Freq: Every day | ORAL | Status: DC
  Administered 2019-05-05: 09:00:00 20 mg via ORAL
  Filled 2019-05-04: qty 1

## 2019-05-04 MED ORDER — DEXAMETHASONE SODIUM PHOSPHATE 10 MG/ML IJ SOLN
INTRAMUSCULAR | Status: DC | PRN
  Administered 2019-05-04: 10 mg via INTRAVENOUS

## 2019-05-04 MED ORDER — FENTANYL CITRATE (PF) 100 MCG/2ML IJ SOLN
INTRAMUSCULAR | Status: DC | PRN
  Administered 2019-05-04 (×2): 50 ug via INTRAVENOUS

## 2019-05-04 MED ORDER — SODIUM CHLORIDE 0.9 % IV SOLN
75.0000 mL/h | INTRAVENOUS | Status: DC
  Administered 2019-05-04 – 2019-05-05 (×2): 75 mL/h via INTRAVENOUS

## 2019-05-04 MED ORDER — ROPIVACAINE HCL 5 MG/ML IJ SOLN
INTRAMUSCULAR | Status: DC | PRN
  Administered 2019-05-04: 20 mL via PERINEURAL

## 2019-05-04 MED ORDER — ASPIRIN EC 325 MG PO TBEC
325.0000 mg | DELAYED_RELEASE_TABLET | Freq: Every day | ORAL | Status: DC
  Administered 2019-05-05: 325 mg via ORAL
  Filled 2019-05-04: qty 1

## 2019-05-04 MED ORDER — TRANEXAMIC ACID 1000 MG/10ML IV SOLN
INTRAVENOUS | Status: DC | PRN
  Administered 2019-05-04: 2000 mg via TOPICAL

## 2019-05-04 MED ORDER — INSULIN ASPART 100 UNIT/ML ~~LOC~~ SOLN
0.0000 [IU] | Freq: Every day | SUBCUTANEOUS | Status: DC
  Administered 2019-05-04: 22:00:00 5 [IU] via SUBCUTANEOUS

## 2019-05-04 MED ORDER — ONDANSETRON HCL 4 MG PO TABS: 4.0000 mg | ORAL_TABLET | Freq: Four times a day (QID) | ORAL | Status: DC | PRN

## 2019-05-04 MED ORDER — EPHEDRINE SULFATE-NACL 50-0.9 MG/10ML-% IV SOSY
PREFILLED_SYRINGE | INTRAVENOUS | Status: DC | PRN
  Administered 2019-05-04: 10 mg via INTRAVENOUS

## 2019-05-04 MED ORDER — PROMETHAZINE HCL 25 MG/ML IJ SOLN: 6.2500 mg | INTRAMUSCULAR | Status: DC | PRN

## 2019-05-04 MED ORDER — MENTHOL 3 MG MT LOZG: 1.0000 | LOZENGE | OROMUCOSAL | Status: DC | PRN

## 2019-05-04 MED ORDER — FLEET ENEMA 7-19 GM/118ML RE ENEM: 1.0000 | ENEMA | Freq: Once | RECTAL | Status: DC | PRN

## 2019-05-04 MED ORDER — KETOROLAC TROMETHAMINE 15 MG/ML IJ SOLN
7.5000 mg | Freq: Four times a day (QID) | INTRAMUSCULAR | Status: AC
  Administered 2019-05-04 – 2019-05-05 (×4): 7.5 mg via INTRAVENOUS
  Filled 2019-05-04 (×4): qty 1

## 2019-05-04 MED ORDER — TRANEXAMIC ACID-NACL 1000-0.7 MG/100ML-% IV SOLN
INTRAVENOUS | Status: DC | PRN
  Administered 2019-05-04: 1000 mg via INTRAVENOUS

## 2019-05-04 SURGICAL SUPPLY — 58 items
BAG DECANTER FOR FLEXI CONT (MISCELLANEOUS) ×3 IMPLANT
BAG SPEC THK2 15X12 ZIP CLS (MISCELLANEOUS) ×1
BAG ZIPLOCK 12X15 (MISCELLANEOUS) ×3 IMPLANT
BLADE SAGITTAL 25.0X1.19X90 (BLADE) ×2 IMPLANT
BLADE SAGITTAL 25.0X1.19X90MM (BLADE) ×1
BNDG CMPR MED 15X6 ELC VLCR LF (GAUZE/BANDAGES/DRESSINGS) ×1
BNDG ELASTIC 6X15 VLCR STRL LF (GAUZE/BANDAGES/DRESSINGS) ×2 IMPLANT
BNDG GAUZE ELAST 4 BULKY (GAUZE/BANDAGES/DRESSINGS) ×3 IMPLANT
BOWL SMART MIX CTS (DISPOSABLE) ×3 IMPLANT
CEMENT HV SMART SET (Cement) ×6 IMPLANT
CEMENT TIBIA MBT SIZE 5 (Knees) IMPLANT
COMP FEM CEM STD+ LT LCS (Orthopedic Implant) ×3 IMPLANT
COMP PATELLA PEGX3 CEM STAN+ (Knees) ×3 IMPLANT
COMPONENT FEM CEM STD+ LT LCS (Orthopedic Implant) IMPLANT
COMPONENT PTLA PEGX3 CEM STAN+ (Knees) IMPLANT
COVER SURGICAL LIGHT HANDLE (MISCELLANEOUS) ×3 IMPLANT
COVER WAND RF STERILE (DRAPES) ×2 IMPLANT
CUFF TOURN SGL QUICK 34 (TOURNIQUET CUFF) ×3
CUFF TRNQT CYL 34X4.125X (TOURNIQUET CUFF) ×2 IMPLANT
DECANTER SPIKE VIAL GLASS SM (MISCELLANEOUS) ×5 IMPLANT
DRAPE IMP U-DRAPE 54X76 (DRAPES) ×3 IMPLANT
DRAPE SHEET LG 3/4 BI-LAMINATE (DRAPES) ×6 IMPLANT
DRSG ADAPTIC 3X8 NADH LF (GAUZE/BANDAGES/DRESSINGS) ×3 IMPLANT
DRSG EMULSION OIL 3X16 NADH (GAUZE/BANDAGES/DRESSINGS) ×2 IMPLANT
DRSG PAD ABDOMINAL 8X10 ST (GAUZE/BANDAGES/DRESSINGS) ×3 IMPLANT
DURAPREP 26ML APPLICATOR (WOUND CARE) ×8 IMPLANT
ELECT REM PT RETURN 15FT ADLT (MISCELLANEOUS) ×3 IMPLANT
GAUZE SPONGE 4X4 12PLY STRL (GAUZE/BANDAGES/DRESSINGS) ×3 IMPLANT
GLOVE BIOGEL PI IND STRL 8 (GLOVE) ×1 IMPLANT
GLOVE BIOGEL PI IND STRL 8.5 (GLOVE) ×1 IMPLANT
GLOVE BIOGEL PI INDICATOR 8 (GLOVE) ×2
GLOVE BIOGEL PI INDICATOR 8.5 (GLOVE) ×2
GLOVE ECLIPSE 8.0 STRL XLNG CF (GLOVE) ×8 IMPLANT
GLOVE ECLIPSE 8.5 STRL (GLOVE) ×8 IMPLANT
GOWN STRL REUS W/ TWL LRG LVL3 (GOWN DISPOSABLE) ×1 IMPLANT
GOWN STRL REUS W/TWL 2XL LVL3 (GOWN DISPOSABLE) ×3 IMPLANT
GOWN STRL REUS W/TWL LRG LVL3 (GOWN DISPOSABLE) ×3
HANDPIECE INTERPULSE COAX TIP (DISPOSABLE) ×3
HOLDER FOLEY CATH W/STRAP (MISCELLANEOUS) ×2 IMPLANT
INSERT TIB LCS RP STD+ 12.5 (Knees) ×2 IMPLANT
KIT TURNOVER KIT A (KITS) ×2 IMPLANT
MANIFOLD NEPTUNE II (INSTRUMENTS) ×3 IMPLANT
NS IRRIG 1000ML POUR BTL (IV SOLUTION) ×3 IMPLANT
PACK TOTAL KNEE CUSTOM (KITS) ×3 IMPLANT
PADDING CAST COTTON 6X4 STRL (CAST SUPPLIES) ×4 IMPLANT
PIN STEINMAN FIXATION KNEE (PIN) ×2 IMPLANT
PROTECTOR NERVE ULNAR (MISCELLANEOUS) ×3 IMPLANT
SET HNDPC FAN SPRY TIP SCT (DISPOSABLE) ×1 IMPLANT
STAPLER VISISTAT 35W (STAPLE) ×3 IMPLANT
SUT BONE WAX W31G (SUTURE) ×3 IMPLANT
SUT ETHIBOND NAB CT1 #1 30IN (SUTURE) ×6 IMPLANT
SUT MNCRL AB 3-0 PS2 18 (SUTURE) ×3 IMPLANT
SUT VIC AB 2-0 PS2 27 (SUTURE) ×3 IMPLANT
TIBIA MBT CEMENT SIZE 5 (Knees) ×3 IMPLANT
TRAY FOLEY MTR SLVR 16FR STAT (SET/KITS/TRAYS/PACK) ×3 IMPLANT
UNDERPAD 30X36 HEAVY ABSORB (UNDERPADS AND DIAPERS) ×1 IMPLANT
WATER STERILE IRR 1000ML POUR (IV SOLUTION) ×6 IMPLANT
WRAP KNEE MAXI GEL POST OP (GAUZE/BANDAGES/DRESSINGS) ×3 IMPLANT

## 2019-05-04 NOTE — Evaluation (Signed)
Physical Therapy Evaluation Patient Details Name: Charles Trujillo MRN: [id number] DOB: Aug 08, 1950 Today's Date: 05/04/2019   History of Present Illness  L TKA  Clinical Impression  Pt is s/p TKA resulting in the deficits listed below (see PT Problem List). Pt ambulated 30' with RW, initiated TKA HEP. Good progress expected.  Pt will benefit from skilled PT to increase their independence and safety with mobility to allow discharge to the venue listed below.      Follow Up Recommendations Follow surgeon's recommendation for DC plan and follow-up therapies    Equipment Recommendations  None recommended by PT    Recommendations for Other Services       Precautions / Restrictions Precautions Precautions: Knee Precaution Booklet Issued: Yes (comment) Precaution Comments: reviwed no pillow under knee Restrictions Weight Bearing Restrictions: Yes LLE Weight Bearing: Partial weight bearing LLE Partial Weight Bearing Percentage or Pounds: 50%      Mobility  Bed Mobility Overal bed mobility: Needs Assistance Bed Mobility: Supine to Sit     Supine to sit: Min assist     General bed mobility comments: MIn A to support LLE, HOB up, used rail  Transfers Overall transfer level: Needs assistance Equipment used: Rolling walker (2 wheeled) Transfers: Sit to/from Stand Sit to Stand: Min assist;From elevated surface         General transfer comment: VCs hand placement, min A to power up  Ambulation/Gait Ambulation/Gait assistance: Min guard Gait Distance (Feet): 30 Feet Assistive device: Rolling walker (2 wheeled) Gait Pattern/deviations: Step-to pattern;Decreased step length - right;Decreased step length - left Gait velocity: decr   General Gait Details: VCs sequencing, no loss of balance, distance limited by pain/fatigue  Stairs            Wheelchair Mobility    Modified Rankin (Stroke Patients Only)       Balance Overall balance assessment: Needs  assistance   Sitting balance-Leahy Scale: Good     Standing balance support: Bilateral upper extremity supported Standing balance-Leahy Scale: Fair                               Pertinent Vitals/Pain Pain Assessment: 0-10 Pain Score: 1  Pain Location: L knee Pain Descriptors / Indicators: Sore Pain Intervention(s): Limited activity within patient's tolerance;Monitored during session;Premedicated before session;Ice applied    Home Living Family/patient expects to be discharged to:: Private residence Living Arrangements: Spouse/significant other Available Help at Discharge: Family;Available 24 hours/day Type of Home: House Home Access: Stairs to enter   [employer] of Steps: 1 Home Layout: One level Home Equipment: Walker - 2 wheels;Cane - single point;Bedside commode      Prior Function Level of Independence: Independent               Hand Dominance        Extremity/Trunk Assessment   Upper Extremity Assessment Upper Extremity Assessment: Overall WFL for tasks assessed    Lower Extremity Assessment Lower Extremity Assessment: LLE deficits/detail LLE Deficits / Details: SLR 3/5, knee AAROM 5-45* LLE Sensation: WNL LLE Coordination: WNL    Cervical / Trunk Assessment Cervical / Trunk Assessment: Normal  Communication   Communication: No difficulties  Cognition Arousal/Alertness: Awake/alert Behavior During Therapy: WFL for tasks assessed/performed Overall Cognitive Status: Within Functional Limits for tasks assessed  General Comments      Exercises Total Joint Exercises Ankle Circles/Pumps: AROM;Both;10 reps;Supine Quad Sets: AROM;Left;5 reps;Supine Heel Slides: AAROM;Left;10 reps;Supine Long Arc Quad: AROM;Left;5 reps;Seated   Assessment/Plan    PT Assessment Patient needs continued PT services  PT Problem List Decreased strength;Decreased range of motion;Decreased  activity tolerance;Decreased knowledge of use of DME;Decreased mobility;Pain       PT Treatment Interventions DME instruction;Gait training;Stair training;Functional mobility training;Therapeutic exercise;Therapeutic activities;Patient/family education    PT Goals (Current goals can be found in the Care Plan section)  Acute Rehab PT Goals Patient Stated Goal: play guitar PT Goal Formulation: With patient/family Time For Goal Achievement: 05/18/19 Potential to Achieve Goals: Good    Frequency 7X/week   Barriers to discharge        Co-evaluation               AM-PAC PT "6 Clicks" Mobility  Outcome Measure Help needed turning from your back to your side while in a flat bed without using bedrails?: A Little Help needed moving from lying on your back to sitting on the side of a flat bed without using bedrails?: A Little Help needed moving to and from a bed to a chair (including a wheelchair)?: A Little Help needed standing up from a chair using your arms (e.g., wheelchair or bedside chair)?: A Little Help needed to walk in hospital room?: A Little Help needed climbing 3-5 steps with a railing? : A Lot 6 Click Score: 17    End of Session Equipment Utilized During Treatment: Gait belt Activity Tolerance: Patient tolerated treatment well Patient left: in chair;with call bell/phone within reach;with chair alarm set;with family/visitor present Nurse Communication: Mobility status PT Visit Diagnosis: Muscle weakness (generalized) (M62.81);Difficulty in walking, not elsewhere classified (R26.2);Pain Pain - Right/Left: Left Pain - part of body: Knee    Time: [id number] PT Time Calculation (min) (ACUTE ONLY): 36 min   Charges:   PT Evaluation $PT Eval Low Complexity: 1 Low PT Treatments $Gait Training: 8-22 mins        Blondell Reveal Kistler PT 05/04/2019  Acute Rehabilitation Services Pager [phone] Office [phone]

## 2019-05-04 NOTE — H&P (Signed)
The recent History & Physical has been reviewed. I have personally examined the patient today. There is no interval change to the documented History & Physical. The patient would like to proceed with the procedure.  Garald Balding 05/04/2019,  7:11 AM

## 2019-05-04 NOTE — Anesthesia Postprocedure Evaluation (Signed)
Anesthesia Post Note  Patient: Charles Trujillo  Procedure(s) Performed: LEFT TOTAL KNEE ARTHROPLASTY (Left Knee)     Patient location during evaluation: PACU Anesthesia Type: Regional, Spinal and MAC Level of consciousness: awake and alert Pain management: pain level controlled Vital Signs Assessment: post-procedure vital signs reviewed and stable Respiratory status: spontaneous breathing, nonlabored ventilation and respiratory function stable Cardiovascular status: blood pressure returned to baseline and stable Postop Assessment: no apparent nausea or vomiting Anesthetic complications: no    Last Vitals:  Vitals:   05/04/19 1142 05/04/19 1309  BP: 117/76 133/66  Pulse: 68 77  Resp: 16 16  Temp:  (!) 36.4 C  SpO2: 98% 97%    Last Pain:  Vitals:   05/04/19 1336  TempSrc:   PainSc: [place]

## 2019-05-04 NOTE — Anesthesia Procedure Notes (Signed)
Spinal  Patient location during procedure: OR Start time: 05/04/2019 7:22 AM End time: 05/04/2019 7:26 AM Staffing Resident/CRNA: Claudia Desanctis, CRNA Performed: resident/CRNA  Preanesthetic Checklist Completed: patient identified, site marked, surgical consent, pre-op evaluation, timeout performed, IV checked, risks and benefits discussed and monitors and equipment checked Spinal Block Patient position: sitting Prep: DuraPrep Patient monitoring: heart rate, cardiac monitor, continuous pulse ox and blood pressure Approach: midline Location: L3-4 Injection technique: single-shot Needle Needle type: Sprotte and Pencan  Needle gauge: 24 G Needle length: 10 cm Needle insertion depth: 8.5 cm Assessment Sensory level: T4

## 2019-05-04 NOTE — Op Note (Signed)
NAME: Charles Trujillo, Charles Trujillo MEDICAL RECORD OE:[id number] ACCOUNT [id number] DATE OF BIRTH:1950-08-03 FACILITY: [facility] LOCATION: [facility] PHYSICIAN:Zowie Lundahl Sharlotte Alamo, MD  OPERATIVE REPORT  DATE OF PROCEDURE:  05/04/2019  PREOPERATIVE DIAGNOSIS:  End-stage osteoarthritis, left knee.  POSTOPERATIVE DIAGNOSIS:  End-stage osteoarthritis, left knee.  PROCEDURE:  Left total knee replacement.  SURGEON:  [name], MD  ASSISTANT:  [name], PA-C, was present throughout the operative procedure to ensure its timely completion.  ANESTHESIA:  Spinal with adductor canal block and IV sedation.  COMPLICATIONS:  None.  COMPONENTS:  DePuy LCS standard plus femoral component, a #5 rotating keeled tibial tray, a 12.5 mm polyethylene bridging bearing, a metal-backed rotating patella.  Components were secured with polymethyl methacrylate.  DESCRIPTION OF PROCEDURE:  The patient was met in the holding area and identified the left knee as the appropriate operative site and marked it accordingly.  Any questions were answered.  Anesthesia performed an adductor canal block.  The patient was then transported to room #4.  Spinal anesthesia was performed by anesthesia without difficulty.  He was then laid supine on the operating table under IV sedation.  Tourniquet was applied to the left lower extremity to the thigh.  The left  leg was then prepared with chlorhexidine scrub and DuraPrep x2 from the tips of the toes to the tourniquet.  Sterile draping was performed.  A timeout was called.  Left lower extremity was then Esmarch exsanguinated with a proximal tourniquet at 325 mmHg.  Examination of the left knee demonstrated significant varus deformity.  It lacked about a 7 or 8 degrees to full extension and flexed over 110 degrees.  No opening with varus or valgus stress.  A midline longitudinal incision was made centered about the patella, extending from the superior pouch to the tibial tubercle.   Via sharp dissection, the incision was carried down to subcutaneous tissue in the first layer of capsule.  A medial  parapatellar incision was then made with the Bovie.  The joint was entered.  There was a clear yellow joint effusion.  Patella was everted 180 degrees laterally and the knee flexed to 90 degrees.  There were large osteophytes along the medial and lateral femoral condyle associated with abundant amount of beefy red synovitis.  Care was taken to remove the osteophytes for  appropriate femoral sizing.  Care was also taken to perform a synovectomy.  I measured a standard plus femoral component.  A partial medial release was performed so that I could align the leg in neutral.  The first bony cut was then made transversely in the proximal tibia using the external tibial guide.  There was about a 7-degree angle of declination.  Bony cut was then made.  We checked our alignment with the external guide.  We did so after each bony  cut on both the femur and the tibia.  Subsequent cuts were then made on the femur using the standard plus femoral jig.  Laminar spreaders were then placed along the medial and lateral compartment.  I removed medial and lateral menisci, ACL and PCL.  Osteophytes were removed from the  posterior femoral condyle with a 3/4-inch curved osteotome.  There were 2 small loose bodies measuring about a centimeter in diameter.  There was also abundant beefy red synovitis in the posterior aspect of the knee.  I thought I had nice cleanout  posteriorly.  Throughout the procedure, the LCL and the MCL remained intact.  Our flexion and extension gaps were perfectly symmetrical at 12.5.  Final cut was then made on the femur anteriorly using 4 degrees of valgus.  Final cuts were then made with the standard plus femoral jig to obtain the oblique cuts in the center hole.  A retractor was then placed about the tibia.  It was advanced anteriorly.  I measured a #5 tibial tray.  This was  pinned in place.  Center hole was then made followed by the keeled cut.  With the tibial trial jig in place, the 12.5 mm polyethylene  bearing was applied followed by a trial standard plus femoral component.  The construct was reduced, and through a full range of motion, we had full extension and flexion and no malrotation of the tibial component.  There was no opening with varus or  valgus stress.  The patella was then prepared by removing approximately 11 mm of bone, leaving about 13.5 mm of patella thickness.  Three holes were then made for the component, and then the trial patella was applied.  This was reduced and through a full range of motion  remained perfectly stable and tracked in the midline.  Trial components were then removed.  The joint was copiously irrigated with saline solution.  The final components were then impacted with polymethyl methacrylate.  Extraneous methacrylate was removed from the periphery of the components.  At approximately 16 minutes, the methacrylate had matured.  During this time, we removed any further  synovitis and injected the deep capsule with 0.25% Marcaine without epinephrine.  Tourniquet was released at 87 minutes.  The joint was then explored without unusual bleeding.  I did use tranexamic acid intravenously.  Any further extraneous methacrylate was removed, and the joint was then copiously irrigated.  With a nice dry field, the deep capsule was closed with a running #1 Ethibond superficial capsule with 0 running Vicryl.  Subcu was closed with several layers  with Vicryl and 3-0 Monocryl.  Skin closed with skin clips.  A sterile bulky dressing was applied followed by an Ace bandage.  The patient tolerated these without complications.  LN/NUANCE  D:05/04/2019 T:05/04/2019 JOB:[id number]

## 2019-05-04 NOTE — Anesthesia Procedure Notes (Signed)
Anesthesia Regional Block: Adductor canal block   Pre-Anesthetic Checklist: ,, timeout performed, Correct Patient, Correct Site, Correct Laterality, Correct Procedure, Correct Position, site marked, Risks and benefits discussed,  Surgical consent,  Pre-op evaluation,  At surgeon's request and post-op pain management  Laterality: Left  Prep: Maximum Sterile Barrier Precautions used, chloraprep       Needles:  Injection technique: Single-shot  Needle Type: Echogenic Stimulator Needle     Needle Length: 9cm  Needle Gauge: 22     Additional Needles:   Procedures:,,,, ultrasound used (permanent image in chart),,,,  Narrative:  Start time: 05/04/2019 6:43 AM End time: 05/04/2019 6:53 AM Injection made incrementally with aspirations every 5 mL.  Performed by: Personally  Anesthesiologist: Pervis Hocking, DO  Additional Notes: Monitors applied. No increased pain on injection. No increased resistance to injection. Injection made in 5cc increments. Good needle visualization. Patient tolerated procedure well.

## 2019-05-04 NOTE — Op Note (Signed)
PATIENT ID:      Charles Trujillo  MRN:     [id number] DOB/AGE:    08-18-1950 / 68 y.o.       OPERATIVE REPORT    DATE OF PROCEDURE:  05/04/2019       PREOPERATIVE DIAGNOSIS: end stage  osteoarthritis left knee                                                       Estimated body mass index is 31.78 kg/m as calculated from the following:   Height as of this encounter: 5\' 8"  (1.727 m).   Weight as of this encounter: 94.8 kg.     POSTOPERATIVE DIAGNOSIS: end stage  osteoarthritis left knee                                                                     Estimated body mass index is 31.78 kg/m as calculated from the following:   Height as of this encounter: 5\' 8"  (1.727 m).   Weight as of this encounter: 94.8 kg.     PROCEDURE:  Procedure(s): LEFT TOTAL KNEE ARTHROPLASTY      SURGEON:  [name], MD    ASSISTANT:   [name], PA-C   (Present and scrubbed throughout the case, critical for assistance with exposure, retraction, instrumentation, and closure.)          ANESTHESIA: regional, spinal and IV sedation     DRAINS: none :      TOURNIQUET TIME:  Total Tourniquet Time Documented: Thigh (Right) - 87 minutes Total: Thigh (Right) - 87 minutes     COMPLICATIONS:  None   CONDITION:  stable  PROCEDURE IN DETAIL: [place] 05/04/2019, 9:40 AM

## 2019-05-04 NOTE — Transfer of Care (Signed)
Immediate Anesthesia Transfer of Care Note  Patient: Charles Trujillo  Procedure(s) Performed: LEFT TOTAL KNEE ARTHROPLASTY (Left Knee)  Patient Location: PACU  Anesthesia Type:Spinal  Level of Consciousness: drowsy and patient cooperative  Airway & Oxygen Therapy: Patient Spontanous Breathing and Patient connected to nasal cannula oxygen  Post-op Assessment: Report given to RN and Post -op Vital signs reviewed and stable  Post vital signs: Reviewed and stable  Last Vitals:  Vitals Value Taken Time  BP 105/63 05/04/19 0954  Temp    Pulse 73 05/04/19 0955  Resp 18 05/04/19 0955  SpO2 98 % 05/04/19 0955  Vitals shown include unvalidated device data.  Last Pain:  Vitals:   05/04/19 0609  TempSrc: Oral  PainSc:       Patients Stated Pain Goal: 4 ([id number])  Complications: No apparent anesthesia complications

## 2019-05-04 NOTE — Progress Notes (Signed)
Dr. Barnie Mort notified that pt did not stay home after covid test. Per MD, no need to repeat COVID test.

## 2019-05-05 ENCOUNTER — Encounter (HOSPITAL_COMMUNITY): Payer: Self-pay | Admitting: Orthopaedic Surgery

## 2019-05-05 LAB — BASIC METABOLIC PANEL
Anion gap: 10 (ref 5–15)
BUN: 22 mg/dL (ref 8–23)
CO2: 19 mmol/L — ABNORMAL LOW (ref 22–32)
Calcium: 8.4 mg/dL — ABNORMAL LOW (ref 8.9–10.3)
Chloride: 103 mmol/L (ref 98–111)
Creatinine, Ser: 1.43 mg/dL — ABNORMAL HIGH (ref 0.61–1.24)
GFR calc Af Amer: 58 mL/min — ABNORMAL LOW (ref >60)
GFR calc non Af Amer: 50 mL/min — ABNORMAL LOW (ref >60)
Glucose, Bld: 259 mg/dL — ABNORMAL HIGH (ref 70–99)
Potassium: 5.6 mmol/L — ABNORMAL HIGH (ref 3.5–5.1)
Sodium: 132 mmol/L — ABNORMAL LOW (ref 135–145)

## 2019-05-05 LAB — CBC
HCT: 37.6 % — ABNORMAL LOW (ref 39.0–52.0)
Hemoglobin: 12.3 g/dL — ABNORMAL LOW (ref 13.0–17.0)
MCH: 31.7 pg (ref 26.0–34.0)
MCHC: 32.7 g/dL (ref 30.0–36.0)
MCV: 96.9 fL (ref 80.0–100.0)
Platelets: 221 10*3/uL (ref 150–400)
RBC: 3.88 MIL/uL — ABNORMAL LOW (ref 4.22–5.81)
RDW: 12.5 % (ref 11.5–15.5)
WBC: 14.2 10*3/uL — ABNORMAL HIGH (ref 4.0–10.5)
nRBC: 0 % (ref 0.0–0.2)

## 2019-05-05 LAB — GLUCOSE, CAPILLARY: Glucose-Capillary: 200 mg/dL — ABNORMAL HIGH (ref 70–99)

## 2019-05-05 MED ORDER — METHOCARBAMOL 500 MG PO TABS: 500.0000 mg | ORAL_TABLET | Freq: Three times a day (TID) | ORAL | 0 refills | Status: DC | PRN

## 2019-05-05 MED ORDER — ASPIRIN 325 MG PO TBEC: 325.0000 mg | DELAYED_RELEASE_TABLET | Freq: Every day | ORAL | 0 refills | Status: DC

## 2019-05-05 MED ORDER — HYDROMORPHONE HCL 2 MG PO TABS: 2.0000 mg | ORAL_TABLET | ORAL | 0 refills | Status: DC | PRN

## 2019-05-05 MED FILL — HYDROmorphone HCL 2 MG TABS: 2 | 7 days supply | Qty: 40 | Fill #0

## 2019-05-05 MED FILL — METHOCARBAMOL 500 MG TABLET: 500 | 10 days supply | Qty: 30 | Fill #0

## 2019-05-05 MED FILL — ASPIRIN EC 325 MG TABLET: 325 | 30 days supply | Qty: 30 | Fill #0

## 2019-05-05 NOTE — Plan of Care (Signed)

## 2019-05-05 NOTE — Progress Notes (Signed)
Physical Therapy Treatment Patient Details Name: Charles Trujillo MRN: [id number] DOB: [birth year] Today's Date: 05/05/2019    History of Present Illness L TKA    PT Comments    Pt is progressing well with mobility and is ready to DC home from PT standpoint. He ambulated 120' with RW, completed stair training, and demonstrates understanding of HEP.   Follow Up Recommendations  Follow surgeon's recommendation for DC plan and follow-up therapies     Equipment Recommendations  None recommended by PT    Recommendations for Other Services       Precautions / Restrictions Precautions Precautions: Knee Precaution Booklet Issued: Yes (comment) Precaution Comments: reviwed no pillow under knee Restrictions Weight Bearing Restrictions: Yes LLE Weight Bearing: Partial weight bearing LLE Partial Weight Bearing Percentage or Pounds: 50%    Mobility  Bed Mobility Overal bed mobility: Modified Independent Bed Mobility: Supine to Sit     Supine to sit: Modified independent (Device/Increase time)     General bed mobility comments: HOB up, used rail  Transfers Overall transfer level: Needs assistance Equipment used: Rolling walker (2 wheeled) Transfers: Sit to/from Stand Sit to Stand: From elevated surface;Supervision         General transfer comment: VCs hand placement  Ambulation/Gait Ambulation/Gait assistance: Supervision Gait Distance (Feet): 120 Feet Assistive device: Rolling walker (2 wheeled) Gait Pattern/deviations: Step-to pattern;Decreased step length - right;Decreased step length - left Gait velocity: decr   General Gait Details: VCs sequencing, no loss of balance, VCs to lift head   Stairs Stairs: Yes Stairs assistance: Min assist Stair Management: No rails;Backwards;Step to pattern;With walker Number of Stairs: 1 General stair comments: wife present for stair training and assist with stabilizing RW, VCs sequencing, Min A to manage RW   Wheelchair  Mobility    Modified Rankin (Stroke Patients Only)       Balance Overall balance assessment: Needs assistance   Sitting balance-Leahy Scale: Good     Standing balance support: Bilateral upper extremity supported Standing balance-Leahy Scale: Fair                              Cognition Arousal/Alertness: Awake/alert Behavior During Therapy: WFL for tasks assessed/performed Overall Cognitive Status: Within Functional Limits for tasks assessed                                        Exercises Total Joint Exercises Ankle Circles/Pumps: AROM;Both;10 reps;Supine Quad Sets: AROM;Left;Supine;10 reps Towel Squeeze: AROM;Both;5 reps;Supine Short Arc Quad: AROM;Left;10 reps;Supine Heel Slides: AAROM;Left;10 reps;Supine Hip ABduction/ADduction: AROM;Left;10 reps;Supine Straight Leg Raises: AAROM;AROM;Left;5 reps;Supine Long Arc Quad: AROM;Left;Seated;10 reps Knee Flexion: AAROM;AROM;Left;10 reps;Seated Goniometric ROM: 5-65* AAROM L knee in sitting    General Comments        Pertinent Vitals/Pain Pain Score: 5  Pain Location: L knee Pain Descriptors / Indicators: Sore Pain Intervention(s): Limited activity within patient's tolerance;Monitored during session;Premedicated before session;Ice applied    Home Living                      Prior Function            PT Goals (current goals can now be found in the care plan section) Acute Rehab PT Goals Patient Stated Goal: play guitar, yardwork PT Goal Formulation: With patient/family Time For Goal Achievement: 05/18/19 Potential to Achieve Goals: Good  Progress towards PT goals: Progressing toward goals    Frequency    7X/week      PT Plan Current plan remains appropriate    Co-evaluation              AM-PAC PT "6 Clicks" Mobility   Outcome Measure  Help needed turning from your back to your side while in a flat bed without using bedrails?: None Help needed moving from  lying on your back to sitting on the side of a flat bed without using bedrails?: A Little Help needed moving to and from a bed to a chair (including a wheelchair)?: None Help needed standing up from a chair using your arms (e.g., wheelchair or bedside chair)?: None Help needed to walk in hospital room?: None Help needed climbing 3-5 steps with a railing? : A Little 6 Click Score: 22    End of Session Equipment Utilized During Treatment: Gait belt Activity Tolerance: Patient tolerated treatment well Patient left: in chair;with call bell/phone within reach;with family/visitor present Nurse Communication: Mobility status PT Visit Diagnosis: Muscle weakness (generalized) (M62.81);Difficulty in walking, not elsewhere classified (R26.2);Pain Pain - Right/Left: Left Pain - part of body: Knee     Time: [id number] PT Time Calculation (min) (ACUTE ONLY): 40 min  Charges:  $Gait Training: 8-22 mins $Therapeutic Exercise: 8-22 mins $Therapeutic Activity: 8-22 mins                     Blondell Reveal Kistler PT 05/05/2019  Acute Rehabilitation Services Pager [phone] Office [phone]

## 2019-05-05 NOTE — TOC Transition Note (Signed)
Transition of Care [facility]) - CM/SW Discharge Note   Patient Details  Name: Charles Trujillo MRN: [id number] Date of Birth: 26-Oct-1950  [facility]) CM/SW Contact:  Leeroy Cha, RN Phone Number: 05/05/2019, 10:41 AM   Clinical Narrative:       Final next level of care: [place] Barriers to Discharge: No Barriers Identified   Patient Goals and CMS Choice  dcd to home with hhc kah and dme      Discharge Placement                       Discharge Plan and Services   Discharge Planning Services: CM Consult Post Acute Care Choice: Home Health, Durable Medical Equipment          DME Arranged: Walker rolling, 3-N-1 DME Agency: [employer] Date DME Agency Contacted: 05/05/19 Time DME Agency Contacted: 0900 Representative spoke with at DME Agency: nathan HH Arranged: PT [place]: [facility] [employer]) Date [place]: 05/05/19 Time HH Agency Contacted: 0900 Representative spoke with at [place]: [place] ([place]) Interventions     Readmission Risk Interventions No flowsheet data found.

## 2019-05-05 NOTE — Progress Notes (Signed)
Discharge paperwork discussed with pt and wife at the bedside.  They demonstrated understanding.  Pt to be escorted to main lobby by wheelchair.

## 2019-05-05 NOTE — Discharge Summary (Signed)
Charles Fears, MD   Charles Borg, PA-C [address], [place], [place]  [address]                             [phone]  PATIENT ID: [name]        MRN:  [id number]          DOB/AGE: [birth year] / 68 y.o.    DISCHARGE SUMMARY  ADMISSION DATE:    05/04/2019 DISCHARGE DATE:   05/05/2019   ADMISSION DIAGNOSIS: osteoarthritis left knee    DISCHARGE DIAGNOSIS:  osteoarthritis left knee    ADDITIONAL DIAGNOSIS: Active Problems:   Osteoarthritis of left knee  Past Medical History:  Diagnosis Date  . Anxiety   . Arthritis    bilateral knees  . Depression   . DM2 (diabetes mellitus, type 2) ([facility])   . Dysrhythmia     benign tachycardia, takes [place]  . Elevated LFTs    previously evaluated by Dr Sharlett Iles; h/o "fatty liver"  . GERD (gastroesophageal reflux disease)   . HLD (hyperlipidemia)   . Hypertension   . Inguinal hernia    right  . Squamous cell carcinoma    top of head    PROCEDURE: Procedure(s): LEFT TOTAL KNEE ARTHROPLASTY  on 05/04/2019  CONSULTS: none    HISTORY: Charles Trujillo, 68 y.o. male, has a history of pain and functional disability in the left knee due to arthritis and has failed non-surgical conservative treatments for greater than 12 weeks to includeNSAID's and/or analgesics, corticosteriod injections, viscosupplementation injections, flexibility and strengthening excercises and activity modification.  Onset of symptoms was gradual, starting 7 years ago with gradually worsening course since that time. The patient noted no past surgery on the left knee(s).  Patient currently rates pain in the left knee(s) at 6 out of 10 with activity. Patient has night pain, worsening of pain with activity and weight bearing, pain that interferes with activities of daily living, crepitus and joint swelling.  Patient has evidence of subchondral cysts, subchondral sclerosis, periarticular osteophytes, joint subluxation and joint space narrowing by  imaging studies. There is no active infection.   HOSPITAL COURSE:  Charles Trujillo is a 67 y.o. admitted on 05/04/2019 and found to have a diagnosis of osteoarthritis left knee.  After appropriate laboratory studies were obtained  they were taken to the operating room on 05/04/2019 and underwent  Procedure(s): LEFT TOTAL KNEE ARTHROPLASTY.   They were given perioperative antibiotics:  Anti-infectives (From admission, onward)   Start     Dose/Rate Route Frequency Ordered Stop   05/04/19 1400  ceFAZolin (ANCEF) IVPB 2g/100 mL premix     2 g 200 mL/hr over 30 Minutes Intravenous Every 6 hours 05/04/19 1051 05/04/19 2231   05/04/19 0600  ceFAZolin (ANCEF) IVPB 2g/100 mL premix     2 g 200 mL/hr over 30 Minutes Intravenous On call to O.R. 05/04/19 [id number] 05/04/19 0747    .  Tolerated the procedure well.  Placed with a foley intraoperatively.     Toradol was given post op.  POD #1, allowed out of bed to a chair.  PT for ambulation and exercise program.  Foley D/C'd in morning.  IV saline locked.  O2 discontionued.  The remainder of the hospital course was dedicated to ambulation and strengthening.   The patient was discharged on 1 Day Post-Op in  Stable condition.  Blood products given:none  DIAGNOSTIC STUDIES: Recent vital signs:  Patient  Vitals for the past 24 hrs:  BP Temp Temp src Pulse Resp SpO2  05/05/19 0903 125/63 98.2 F (36.8 C) - 77 17 97 %  05/05/19 0516 135/62 97.9 F (36.6 C) Oral 77 16 97 %  05/05/19 0123 128/64 98 F (36.7 C) Oral 79 16 97 %  05/04/19 2012 135/72 98.1 F (36.7 C) Oral 89 16 97 %  05/04/19 1400 137/70 97.9 F (36.6 C) Oral 84 20 95 %  05/04/19 1309 133/66 (!) 97.5 F (36.4 C) - 77 16 97 %  05/04/19 1142 117/76 - - 68 16 98 %  05/04/19 1052 109/69 97.6 F (36.4 C) Oral 70 16 97 %       Recent laboratory studies: Recent Labs    04/30/19 1017 05/05/19 0254  WBC 6.3 14.2*  HGB 15.6 12.3*  HCT 49.2 37.6*  PLT 266 221   Recent Labs     04/30/19 1017 04/30/19 1524 05/05/19 0254  NA 138 137 132*  K 5.5* 5.0 5.6*  CL 106 104 103  CO2 24 25 19*  BUN 17 17 22   CREATININE 1.34* 1.56* 1.43*  GLUCOSE 136* 144* 259*  CALCIUM 9.1 8.8* 8.4*   Lab Results  Component Value Date   INR 1.1 04/30/2019     Recent Radiographic Studies :  Dg Chest 2 View  Result Date: 04/30/2019 CLINICAL DATA:  Preop exam for knee replacement. No chest complaints. EXAM: CHEST - 2 VIEW COMPARISON:  08/05/2011. FINDINGS: The heart size and mediastinal contours are within normal limits. Both lungs are clear. No pleural effusion or pneumothorax. The visualized skeletal structures are unremarkable. IMPRESSION: No active cardiopulmonary disease. Electronically Signed   By: Lajean Manes M.D.   On: 04/30/2019 13:56    DISCHARGE INSTRUCTIONS: Discharge Instructions    CPM   Complete by: As directed    Continuous passive motion machine (CPM):      Use the CPM from 0 to 60 degrees for 6-8 hours per day.      You may increase by 5-10 per day.  You may break it up into 2 or 3 sessions per day.      Use CPM for 3-4 weeks or until you are told to stop.   Call MD / Call 911   Complete by: As directed    If you experience chest pain or shortness of breath, CALL 911 and be transported to the hospital emergency room.  If you develope a fever above 101 F, pus (white drainage) or increased drainage or redness at the wound, or calf pain, call your surgeon's office.   Change dressing   Complete by: As directed    DO NOT CHANGE YOUR DRESSING.   Constipation Prevention   Complete by: As directed    Drink plenty of fluids.  Prune juice may be helpful.  You may use a stool softener, such as Colace (over the counter) 100 mg twice a day.  Use MiraLax (over the counter) for constipation as needed.   Diet Carb Modified   Complete by: As directed    Discharge instructions   Complete by: As directed    INSTRUCTIONS AFTER JOINT REPLACEMENT   Remove items at home which  could result in a fall. This includes throw rugs or furniture in walking pathways ICE to the affected joint every three hours while awake for 30 minutes at a time, for at least the first 3-5 days, and then as needed for pain and swelling.  Continue to use  ice for pain and swelling. You may notice swelling that will progress down to the foot and ankle.  This is normal after surgery.  Elevate your leg when you are not up walking on it.   Continue to use the breathing machine you got in the hospital (incentive spirometer) which will help keep your temperature down.  It is common for your temperature to cycle up and down following surgery, especially at night when you are not up moving around and exerting yourself.  The breathing machine keeps your lungs expanded and your temperature down.   DIET:  As you were doing prior to hospitalization, we recommend a well-balanced diet.  DRESSING / WOUND CARE / SHOWERING  Keep the surgical dressing until follow up.  The dressing is water proof, so you can shower without any extra covering.  IF THE DRESSING FALLS OFF or the wound gets wet inside, change the dressing with sterile gauze.  Please use good hand washing techniques before changing the dressing.  Do not use any lotions or creams on the incision until instructed by your surgeon.    ACTIVITY  Increase activity slowly as tolerated, but follow the weight bearing instructions below.   No driving for 6 weeks or until further direction given by your physician.  You cannot drive while taking narcotics.  No lifting or carrying greater than 10 lbs. until further directed by your surgeon. Avoid periods of inactivity such as sitting longer than an hour when not asleep. This helps prevent blood clots.  You may return to work once you are authorized by your doctor.     WEIGHT BEARING   Partial weight bearing with assist device as directed.  50%   EXERCISES  Results after joint replacement surgery are often  greatly improved when you follow the exercise, range of motion and muscle strengthening exercises prescribed by your doctor. Safety measures are also important to protect the joint from further injury. Any time any of these exercises cause you to have increased pain or swelling, decrease what you are doing until you are comfortable again and then slowly increase them. If you have problems or questions, call your caregiver or physical therapist for advice.   Rehabilitation is important following a joint replacement. After just a few days of immobilization, the muscles of the leg can become weakened and shrink (atrophy).  These exercises are designed to build up the tone and strength of the thigh and leg muscles and to improve motion. Often times heat used for twenty to thirty minutes before working out will loosen up your tissues and help with improving the range of motion but do not use heat for the first two weeks following surgery (sometimes heat can increase post-operative swelling).   These exercises can be done on a training (exercise) mat,  on a table or on a bed. Use whatever works the best and is most comfortable for you.    Use music or television while you are exercising so that the exercises are a pleasant break in your day. This will make your life better with the exercises acting as a break in your routine that you can look forward to.   Perform all exercises about fifteen times, three times per day or as directed.  You should exercise both the operative leg and the other leg as well.   Exercises include:  Quad Sets - Tighten up the muscle on the front of the thigh (Quad) and hold for 5-10 seconds.   Straight Leg  Raises - With your knee straight (if you were given a brace, keep it on), lift the leg to 60 degrees, hold for 3 seconds, and slowly lower the leg.  Perform this exercise against resistance later as your leg gets stronger.  Leg Slides: Lying on your back, slowly slide your foot toward  your buttocks, bending your knee up off the floor (only go as far as is comfortable). Then slowly slide your foot back down until your leg is flat on the floor again.  Angel Wings: Lying on your back spread your legs to the side as far apart as you can without causing discomfort.  Hamstring Strength:  Lying on your back, push your heel against the floor with your leg straight by tightening up the muscles of your buttocks.  Repeat, but this time bend your knee to a comfortable angle, and push your heel against the floor.  You may put a pillow under the heel to make it more comfortable if necessary.   A rehabilitation program following joint replacement surgery can speed recovery and prevent re-injury in the future due to weakened muscles. Contact your doctor or a physical therapist for more information on knee rehabilitation.    CONSTIPATION  Constipation is defined medically as fewer than three stools per week and severe constipation as less than one stool per week.  Even if you have a regular bowel pattern at home, your normal regimen is likely to be disrupted due to multiple reasons following surgery.  Combination of anesthesia, postoperative narcotics, change in appetite and fluid intake all can affect your bowels.   YOU MUST use at least one of the following options; they are listed in order of increasing strength to get the job done.  They are all available over the counter, and you may need to use some, POSSIBLY even all of these options:    Drink plenty of fluids (prune juice may be helpful) and high fiber foods Colace 100 mg by mouth twice a day  Senokot for constipation as directed and as needed Dulcolax (bisacodyl), take with full glass of water  Miralax (polyethylene glycol) once or twice a day as needed.  If you have tried all these things and are unable to have a bowel movement in the first 3-4 days after surgery call either your surgeon or your primary doctor.    If you experience  loose stools or diarrhea, hold the medications until you stool forms back up.  If your symptoms do not get better within 1 week or if they get worse, check with your doctor.  If you experience "the worst abdominal pain ever" or develop nausea or vomiting, please contact the office immediately for further recommendations for treatment.   ITCHING:  If you experience itching with your medications, try taking only a single pain pill, or even half a pain pill at a time.  You can also use Benadryl over the counter for itching or also to help with sleep.   TED HOSE STOCKINGS:  Use stockings on both legs until for at least 2 weeks or as directed by physician office. They may be removed at night for sleeping.  MEDICATIONS:  See your medication summary on the "After Visit Summary" that nursing will review with you.  You may have some home medications which will be placed on hold until you complete the course of blood thinner medication.  It is important for you to complete the blood thinner medication as prescribed.  PRECAUTIONS:  If you  experience chest pain or shortness of breath - call 911 immediately for transfer to the hospital emergency department.   If you develop a fever greater that 101 F, purulent drainage from wound, increased redness or drainage from wound, foul odor from the wound/dressing, or calf pain - CONTACT YOUR SURGEON.                                                   FOLLOW-UP APPOINTMENTS:  If you do not already have a post-op appointment, please call the office for an appointment to be seen by your surgeon.  Guidelines for how soon to be seen are listed in your "After Visit Summary", but are typically between 1-4 weeks after surgery.  OTHER INSTRUCTIONS:   Knee Replacement:  Do not place pillow under knee, focus on keeping the knee straight while resting. CPM instructions: 0-90 degrees, 2 hours in the morning, 2 hours in the afternoon, and 2 hours in the evening. Place foam block,  curve side up under heel at all times except when in CPM or when walking.  DO NOT modify, tear, cut, or change the foam block in any way.  MAKE SURE YOU:  Understand these instructions.  Get help right away if you are not doing well or get worse.    Thank you for letting us be a part of your medical care team.  It is a privilege we respect greatly.  We hope these instructions will help you stay on track for a fast and full recovery!   Do not put a pillow under the knee. Place it under the heel.   Complete by: As directed    Driving restrictions   Complete by: As directed    No driving for 6 weeks   Increase activity slowly as tolerated   Complete by: As directed    Lifting restrictions   Complete by: As directed    No lifting for 6 weeks   Partial weight bearing   Complete by: As directed    % Body Weight: 50%   Laterality: left   Extremity: Lower   Patient may shower   Complete by: As directed    You may shower over your dressing   TED hose   Complete by: As directed    Use stockings (TED hose) for 3 weeks on left  leg.  You may remove them at night for sleeping.      DISCHARGE MEDICATIONS:   Allergies as of 05/05/2019      Reactions   Codeine Nausea Only   Codeine Phosphate    REACTION: nausea      Medication List    STOP taking these medications   celecoxib 200 MG capsule Commonly known as: CELEBREX   ibuprofen 200 MG tablet Commonly known as: ADVIL     TAKE these medications   aspirin 325 MG EC tablet Take 1 tablet (325 mg total) by mouth daily with breakfast. Start taking on: May 06, 2019   atorvastatin 20 MG tablet Commonly known as: LIPITOR TAKE 1 TABLET BY MOUTH DAILY.   diltiazem 120 MG 24 hr capsule Commonly known as: CARDIZEM CD TAKE 1 CAPSULE BY MOUTH EVERY DAY   glucose blood test strip 1 each by Other route 2 (two) times daily. accu check   glucose blood test strip Commonly known as: ONE TOUCH  TEST STRIPS Check blood sugar twice  daily   HYDROmorphone 2 MG tablet Commonly known as: DILAUDID Take 1 tablet (2 mg total) by mouth every 4 (four) hours as needed for severe pain.   Janumet XR [phone] MG Tb24 Generic drug: SitaGLIPtin-MetFORMIN HCl TAKE 1 TABLET BY MOUTH DAILY. NEED OFFICE VISIT OR PHONE VISIT WITH PCP FOR MORE REFILLS. What changed: See the new instructions.   lisinopril 10 MG tablet Commonly known as: ZESTRIL TAKE 1 TABLET BY MOUTH DAILY.   methocarbamol 500 MG tablet Commonly known as: ROBAXIN Take 1 tablet (500 mg total) by mouth every 8 (eight) hours as needed for muscle spasms.   metoprolol tartrate 50 MG tablet Commonly known as: LOPRESSOR Take 1 tablet by mouth 2 (two) times daily.   Myrbetriq 25 MG Tb24 tablet Generic drug: mirabegron ER TAKE 1 TABLET BY MOUTH DAILY.   OneTouch Delica Lancets Fine Misc Use qd   sildenafil 100 MG tablet Commonly known as: VIAGRA Take 1 tablet (100 mg total) by mouth as needed for erectile dysfunction. Take 100 mg by mouth daily as needed for Erectile Dysfunction.            Durable Medical Equipment  (From admission, onward)         Start     Ordered   05/04/19 1052  DME Walker rolling  Once    Question:  Patient needs a walker to treat with the following condition  Answer:  Total knee replacement status, left   05/04/19 1052   05/04/19 1052  DME 3 n 1  Once     05/04/19 1052   05/04/19 1052  DME Bedside commode  Once    Question:  Patient needs a bedside commode to treat with the following condition  Answer:  Total knee replacement status, left   05/04/19 1052           Discharge Care Instructions  (From admission, onward)         Start     Ordered   05/05/19 0000  Partial weight bearing    Question Answer Comment  % Body Weight 50%   Laterality left   Extremity Lower      05/05/19 1027   05/05/19 0000  Change dressing    Comments: DO NOT CHANGE YOUR DRESSING.   05/05/19 1027          FOLLOW UP VISIT:    Follow-up Information    Garald Balding, MD Follow up on 05/18/2019.   Specialty: Orthopedic Surgery Why: Go to New office on [employer] information: [address] [place] [address] [phone]           DISPOSITION:   Home  CONDITION:  Stable   Mike Craze. [place], [place] [phone]  05/05/2019 10:34 AM

## 2019-05-05 NOTE — Op Note (Signed)
PATIENT ID: Charles Trujillo        MRN:  [id number]          DOB/AGE: 70/03/1951 / 70 y.o.    [name], MD   [name], PA-C [address] [place], [place]  [address]                             [phone]   PROGRESS NOTE  Subjective:  negative for Chest Pain  negative for Shortness of Breath  negative for Nausea/Vomiting   negative for Calf Pain    Tolerating Diet: yes         Patient reports pain as mild and moderate.     Controlled with dilaudid  Objective: Vital signs in last 24 hours:    Patient Vitals for the past 24 hrs:  BP Temp Temp src Pulse Resp SpO2  05/05/19 0516 135/62 97.9 F (36.6 C) Oral 77 16 97 %  05/05/19 0123 128/64 98 F (36.7 C) Oral 79 16 97 %  05/04/19 2012 135/72 98.1 F (36.7 C) Oral 89 16 97 %  05/04/19 1400 137/70 97.9 F (36.6 C) Oral 84 20 95 %  05/04/19 1309 133/66 (!) 97.5 F (36.4 C) - 77 16 97 %  05/04/19 1142 117/76 - - 68 16 98 %  05/04/19 1052 109/69 97.6 F (36.4 C) Oral 70 16 97 %  05/04/19 1030 110/76 97.6 F (36.4 C) - 70 17 98 %  05/04/19 1015 103/66 - - 71 16 100 %  05/04/19 1000 101/62 - - 72 18 99 %  05/04/19 0954 105/63 (!) 97.4 F (36.3 C) - 74 18 95 %      Intake/Output from previous day:   10/20 0701 - 10/21 0700 In: 2721.2 [P.O.:300; I.V.:2221.2] Out: 2380 [Urine:2360]   Intake/Output this shift:   No intake/output data recorded.   Intake/Output      10/20 0701 - 10/21 0700 10/21 0701 - 10/22 0700   P.O. 300    I.V. (mL/kg) 2221.2 (23.4)    IV Piggyback 200    Total Intake(mL/kg) 2721.2 (28.7)    Urine (mL/kg/hr) 2360 (1)    Blood 20    Total Output 2380    Net +341.2            LABORATORY DATA: Recent Labs    04/30/19 1017 05/05/19 0254  WBC 6.3 14.2*  HGB 15.6 12.3*  HCT 49.2 37.6*  PLT 266 221   Recent Labs    04/30/19 1017 04/30/19 1524 05/05/19 0254  NA 138 137 132*  K 5.5* 5.0 5.6*  CL 106 104 103  CO2 24 25 19*  BUN 17 17 22   CREATININE 1.34* 1.56* 1.43*   GLUCOSE 136* 144* 259*  CALCIUM 9.1 8.8* 8.4*   Lab Results  Component Value Date   INR 1.1 04/30/2019    Recent Radiographic Studies :  Dg Chest 2 View  Result Date: 04/30/2019 CLINICAL DATA:  Preop exam for knee replacement. No chest complaints. EXAM: CHEST - 2 VIEW COMPARISON:  08/05/2011. FINDINGS: The heart size and mediastinal contours are within normal limits. Both lungs are clear. No pleural effusion or pneumothorax. The visualized skeletal structures are unremarkable. IMPRESSION: No active cardiopulmonary disease. Electronically Signed   By: Lajean Manes M.D.   On: 04/30/2019 13:56     Examination:  General appearance: alert, cooperative and no distress  Wound Exam: clean, dry, intact   Drainage:  None: wound tissue dry  Motor Exam: Anterior Tibial and Posterior Tibial Intact  Sensory Exam: Superficial Peroneal, Deep Peroneal and Tibial normal  Vascular Exam: Normal  Assessment:    1 Day Post-Op  Procedure(s) (LRB): LEFT TOTAL KNEE ARTHROPLASTY (Left)  ADDITIONAL DIAGNOSIS:  Active Problems:   Osteoarthritis of left knee  Diabetes, hyper kalemia   Plan: Physical Therapy as ordered Partial Weight Bearing @ 50% (PWB)  DVT Prophylaxis:  Aspirin and TED hose  DISCHARGE PLAN: Home  DISCHARGE NEEDS: HHPT, CPM, Walker and 3-in-1 comode seat   Patient's anticipated LOS is less than 2 midnights, meeting these requirements: - Younger than 46 - Lives within 1 hour of care - Has a competent adult at home to recover with post-op recover - NO history of  - Chronic pain requiring opiods  - Diabetes  - Coronary Artery Disease  - Heart failure  - Heart attack  - Stroke  - DVT/VTE  - Cardiac arrhythmia  - Respiratory Failure/COPD  - Renal failure  - Anemia  - Advanced Liver disease OOB with PT, foley out and voiding. Dressing left knee changed to aquacell-wound clean and dry. Will discharge today. K is elevated- will repeat. Had Increased K pre op without  symptoms.                Has DM but renal function OK          [name], PA-C [place]  05/05/2019 7:36 AM

## 2019-05-18 ENCOUNTER — Ambulatory Visit (INDEPENDENT_AMBULATORY_CARE_PROVIDER_SITE_OTHER): Payer: PRIVATE HEALTH INSURANCE

## 2019-05-18 ENCOUNTER — Encounter: Payer: Self-pay | Admitting: Orthopedic Surgery

## 2019-05-18 ENCOUNTER — Ambulatory Visit (INDEPENDENT_AMBULATORY_CARE_PROVIDER_SITE_OTHER): Payer: PRIVATE HEALTH INSURANCE | Admitting: Orthopedic Surgery

## 2019-05-18 ENCOUNTER — Telehealth: Payer: Self-pay | Admitting: Orthopedic Surgery

## 2019-05-18 ENCOUNTER — Other Ambulatory Visit: Payer: Self-pay

## 2019-05-18 VITALS — BP 88/62 | HR 91 | Ht 68.0 in | Wt 219.0 lb

## 2019-05-18 DIAGNOSIS — Z96652 Presence of left artificial knee joint: Secondary | ICD-10-CM | POA: Diagnosis not present

## 2019-05-18 MED ORDER — HYDROMORPHONE HCL 2 MG PO TABS: 2.0000 mg | ORAL_TABLET | Freq: Four times a day (QID) | ORAL | 0 refills | Status: DC | PRN

## 2019-05-18 MED FILL — HYDROmorphone HCL 2 MG TABS: 2 | 5 days supply | Qty: 20 | Fill #0

## 2019-05-18 NOTE — Telephone Encounter (Signed)
Called patient's wife back Levander Campion) with the 3 wk return office visit with Aaron Edelman. (No answer) Patient is schedule 06/08/2019 at 10:45am

## 2019-05-18 NOTE — Patient Instructions (Signed)
t

## 2019-05-18 NOTE — Progress Notes (Signed)
Office Visit Note   Patient: Charles Trujillo           Date of Birth: 1951-04-05           MRN: [id number] Visit Date: 05/18/2019              Requested by: [address], [place], [place] [address] [place],  [place] [address] PCP: Carollee Herter, Alferd Apa, DO   Assessment & Plan: Visit Diagnoses:  1. History of total left knee replacement   2. Total knee replacement status, left     Plan: #1: Continue with his physical therapy as per protocol #2: Wean off of his walker to a cane in the next 1 to 2 weeks. #3: Renewed his Dilaudid as needed.  Follow-Up Instructions: Return in about 3 weeks (around 06/08/2019).   Orders:  Orders Placed This Encounter  Procedures  . XR KNEE 3 VIEW LEFT   Meds ordered this encounter  Medications  . HYDROmorphone (DILAUDID) 2 MG tablet    Sig: Take 1 tablet (2 mg total) by mouth every 6 (six) hours as needed for severe pain.    Dispense:  20 tablet    Refill:  0    Order Specific Question:   Supervising Provider    Answer:   Garald Balding [id number]      Procedures: No procedures performed   Clinical Data: No additional findings.   Subjective: Chief Complaint  Patient presents with  . Left Knee - Routine Post Op    Left total knee arthroplasty DOS 05/04/2019  HPI Patient presents today for follow up on his left knee. He had a left total knee arthroplasty on 05/04/2019. He is now two weeks out from surgery. Doing well. He is doing home therapy three times weekly. He is taking Dilaudid and Robaxin. He said that he might need a refill.    Review of Systems  Unchanged   Objective: Vital Signs: BP (!) 88/62   Pulse 91   Ht 5\' 8"  (1.727 m)   Wt 219 lb (99.3 kg)   BMI 33.30 kg/m   Physical Exam Constitutional:      Appearance: Normal appearance. He is well-developed and normal weight.  Eyes:     Pupils: Pupils are equal, round, and reactive to light.  Pulmonary:     Effort: Pulmonary effort is normal.   Skin:    General: Skin is warm and dry.  Neurological:     Mental Status: He is alert and oriented to person, place, and time.  Psychiatric:        Behavior: Behavior normal.     Ortho Exam  Exam today reveals the need to be quite benign.  He can range from near full extension to 90 degrees.  Good ligament stability.  No calf pain.  Small effusion.  Neurovascular intact distally.  Specialty Comments:  No specialty comments available.  Imaging: Xr Knee 3 View Left  Result Date: 05/18/2019 Three-view x-ray of the left knee reveals excellent position alignment of the prosthesis.  No occult fractures noted.    PMFS History: Current Outpatient Medications  Medication Sig Dispense Refill  . aspirin EC 325 MG EC tablet Take 1 tablet (325 mg total) by mouth daily with breakfast. 30 tablet 0  . atorvastatin (LIPITOR) 20 MG tablet TAKE 1 TABLET BY MOUTH DAILY. 90 tablet 1  . diltiazem (CARDIZEM CD) 120 MG 24 hr capsule TAKE 1 CAPSULE BY MOUTH EVERY DAY 90  capsule 1  . glucose blood (ONE TOUCH TEST STRIPS) test strip Check blood sugar twice daily 100 each 11  . glucose blood test strip 1 each by Other route 2 (two) times daily. accu check    . HYDROmorphone (DILAUDID) 2 MG tablet Take 1 tablet (2 mg total) by mouth every 4 (four) hours as needed for severe pain. 40 tablet 0  . JANUMET XR [phone] MG TB24 TAKE 1 TABLET BY MOUTH DAILY. NEED OFFICE VISIT OR PHONE VISIT WITH PCP FOR MORE REFILLS. (Patient taking differently: Take 1 tablet by mouth daily. ) 90 tablet 1  . lisinopril (PRINIVIL,ZESTRIL) 10 MG tablet TAKE 1 TABLET BY MOUTH DAILY. 90 tablet 1  . methocarbamol (ROBAXIN) 500 MG tablet Take 1 tablet (500 mg total) by mouth every 8 (eight) hours as needed for muscle spasms. 30 tablet 0  . metoprolol (LOPRESSOR) 50 MG tablet Take 1 tablet by mouth 2 (two) times daily.    Marland Kitchen MYRBETRIQ 25 MG TB24 tablet TAKE 1 TABLET BY MOUTH DAILY. 90 tablet 3  . ONETOUCH DELICA LANCETS FINE MISC Use qd  100 each 3  . HYDROmorphone (DILAUDID) 2 MG tablet Take 1 tablet (2 mg total) by mouth every 6 (six) hours as needed for severe pain. 20 tablet 0  . sildenafil (VIAGRA) 100 MG tablet Take 1 tablet (100 mg total) by mouth as needed for erectile dysfunction. Take 100 mg by mouth daily as needed for Erectile Dysfunction. 10 tablet 2   No current facility-administered medications for this visit.     Patient Active Problem List   Diagnosis Date Noted  . Total knee replacement status, left 05/18/2019  . Osteoarthritis of left knee 05/04/2019  . Unilateral primary osteoarthritis, left knee 04/20/2019  . Hyperlipidemia associated with type 2 diabetes mellitus ([place]) 05/19/2018  . Degenerative arthritis of knee, bilateral 06/20/2017  . Left inguinal pain 11/06/2015  . Dysuria 03/27/2015  . Fatigue 03/27/2015  . Benign paroxysmal positional vertigo 04/22/2014  . Dizziness 04/22/2014  . Primary localized osteoarthrosis, lower leg 09/15/2013  . Obesity (BMI 30-39.9) 08/19/2013  . Other and unspecified hyperlipidemia 02/19/2013  . HLD (hyperlipidemia) 03/12/2012  . Tachycardia 03/06/2012  . Inguinal hernia unilateral, non-recurrent 08/02/2011  . Inguinal hernia 06/26/2011  . Preventative health care 02/01/2011  . DM (diabetes mellitus) type II uncontrolled, periph vascular disorder ([place]) 10/20/2009  . CARPAL TUNNEL SYNDROME, BILATERAL 10/03/2009  . ERECTILE DYSFUNCTION, ORGANIC, HX OF 02/06/2009  . DEPRESSION 01/20/2008  . Essential hypertension 01/20/2008  . URI 06/25/2007  . TRANSAMINASES, SERUM, ELEVATED 02/02/2007   Past Medical History:  Diagnosis Date  . Anxiety   . Arthritis    bilateral knees  . Depression   . DM2 (diabetes mellitus, type 2) ([place])   . Dysrhythmia     benign tachycardia, takes [place]  . Elevated LFTs    previously evaluated by Dr Sharlett Iles; h/o "fatty liver"  . GERD (gastroesophageal reflux disease)   . HLD (hyperlipidemia)   . Hypertension   . Inguinal  hernia    right  . Squamous cell carcinoma    top of head    Family History  Problem Relation Age of Onset  . Diabetes Mother   . Hypertension Mother   . Heart disease Mother   . Heart failure Mother   . Stroke Father 41  . Parkinsonism Father   . Depression Brother   . Alcohol abuse Brother   . Cancer Maternal Grandmother        bone  .  Colon cancer Neg Hx   . Colon polyps Neg Hx   . Esophageal cancer Neg Hx   . Rectal cancer Neg Hx   . Stomach cancer Neg Hx     Past Surgical History:  Procedure Laterality Date  . ACHILLES TENDON REPAIR  1999  . CARPAL TUNNEL RELEASE Right 11/04/2014   Procedure: LIMITED OPEN RIGHT CARPAL TUNNEL RELEASE;  Surgeon: Roseanne Kaufman, MD;  Location: [facility];  Service: Orthopedics;  Laterality: Right;  . CARPAL TUNNEL WITH CUBITAL TUNNEL Left 07/22/2014   Procedure: LEFT CARPAL TUNNEL RELEASE AND CUBITAL TUNNEL RELEASE TRANSPOSITION;  Surgeon: Roseanne Kaufman, MD;  Location: [facility];  Service: Orthopedics;  Laterality: Left;  . COLONOSCOPY    . HERNIA REPAIR  07/2011   left side  . INGUINAL HERNIA REPAIR  08/09/2011   Procedure: LAPAROSCOPIC INGUINAL HERNIA;  Surgeon: Joyice Faster. Cornett, MD;  Location: [facility];  Service: General;  Laterality: Right;  . KNEE ARTHROSCOPY  1993   left knee  . KNEE SURGERY     age 33- right  . SQUAMOUS CELL CARCINOMA EXCISION     left knee  . TOTAL KNEE ARTHROPLASTY Left 05/04/2019   Procedure: LEFT TOTAL KNEE ARTHROPLASTY;  Surgeon: Garald Balding, MD;  [facility];  Service: Orthopedics;  Laterality: Left;   Social History   Occupational History  . 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 Smoking status: Current Some Day Smoker    Types: Cigars  . Smokeless tobacco: Never Used  . Tobacco comment: smokes one every other day  Substance and Sexual Activity  . Alcohol use: Yes    Alcohol/week: 3.0 - 4.0 standard drinks    Types: 3 - 4 Cans  of beer per week    Comment: social  . Drug use: No  . Sexual activity: Yes    Partners: Female

## 2019-06-08 ENCOUNTER — Ambulatory Visit: Payer: PRIVATE HEALTH INSURANCE | Admitting: Orthopedic Surgery

## 2019-06-08 ENCOUNTER — Encounter: Payer: Self-pay | Admitting: Orthopedic Surgery

## 2019-06-08 ENCOUNTER — Other Ambulatory Visit: Payer: Self-pay

## 2019-06-08 VITALS — BP 140/85 | HR 103 | Ht 68.0 in | Wt 219.0 lb

## 2019-06-08 DIAGNOSIS — Z96652 Presence of left artificial knee joint: Secondary | ICD-10-CM

## 2019-06-08 MED ORDER — TRAMADOL HCL 50 MG PO TABS: 50.0000 mg | ORAL_TABLET | Freq: Four times a day (QID) | ORAL | 0 refills | Status: DC | PRN

## 2019-06-08 MED FILL — traMADol HCL 50 MG TABS: 50 | 7 days supply | Qty: 30 | Fill #0

## 2019-06-08 NOTE — Progress Notes (Signed)
Office Visit Note   Patient: Charles Trujillo           Date of Birth: [birth year]           MRN: [id number] Visit Date: 06/08/2019              Requested by: [address], [place], [place] [address] [place],  [place] [address] PCP: Carollee Herter, Alferd Apa, DO   Assessment & Plan: Visit Diagnoses:  1. S/P total knee arthroplasty, left     Plan:  #1: Continue his home exercise program. #2: Follow back up with [place] in 4 weeks. #3: Remain out of work until seen at the next visit.  Follow-Up Instructions: Return in about 4 weeks (around 07/06/2019).   Orders:  No orders of the defined types were placed in this encounter.  Meds ordered this encounter  Medications  . traMADol (ULTRAM) 50 MG tablet    Sig: Take 1 tablet (50 mg total) by mouth every 6 (six) hours as needed.    Dispense:  30 tablet    Refill:  0    Order Specific Question:   Supervising Provider    Answer:   Garald Balding [id number]      Procedures: No procedures performed   Clinical Data: No additional findings.   Subjective: Chief Complaint  Patient presents with  . Left Knee - Routine Post Op    Left TKA DOS 05/04/2019  Patient presents today for a follow up on his left knee. He had a left total knee arthroplasty on 05/04/2019. He is now 5 weeks out from surgery. He is doing well. He said that his pain is a level 1. He takes tylenol but states that it does not take away the pain. He finished home therapy.   HPI  As above  Review of Systems  Constitutional: Negative for fatigue.  HENT: Negative for ear pain.   Eyes: Negative for pain.  Respiratory: Negative for shortness of breath.   Cardiovascular: Negative for leg swelling.  Gastrointestinal: Negative for constipation and diarrhea.  Endocrine: Negative for cold intolerance and heat intolerance.  Genitourinary: Negative for difficulty urinating.  Musculoskeletal: Negative for joint swelling.  Skin: Negative for rash.   Allergic/Immunologic: Negative for food allergies.  Neurological: Negative for weakness.  Hematological: Does not bruise/bleed easily.  Psychiatric/Behavioral: Negative for sleep disturbance.     Objective: Vital Signs: BP 140/85   Pulse (!) 103   Ht 5\' 8"  (1.727 m)   Wt 219 lb (99.3 kg)   BMI 33.30 kg/m   Physical Exam  Ortho Exam  Exam today reveals range of motion from near full extension to 110 to 112 degrees.  Good ligament stability.  Smooth motion.  Mild effusion but no warmth.  Wound is clean and dry and healing per primam.  Specialty Comments:  No specialty comments available.  Imaging: No results found.   PMFS History: Current Outpatient Medications  Medication Sig Dispense Refill  . aspirin EC 325 MG EC tablet Take 1 tablet (325 mg total) by mouth daily with breakfast. 30 tablet 0  . atorvastatin (LIPITOR) 20 MG tablet TAKE 1 TABLET BY MOUTH DAILY. 90 tablet 1  . diltiazem (CARDIZEM CD) 120 MG 24 hr capsule TAKE 1 CAPSULE BY MOUTH EVERY DAY 90 capsule 1  . glucose blood (ONE TOUCH TEST STRIPS) test strip Check blood sugar twice daily 100 each 11  . glucose blood test strip 1 each by Other route  2 (two) times daily. accu check    . HYDROmorphone (DILAUDID) 2 MG tablet Take 1 tablet (2 mg total) by mouth every 4 (four) hours as needed for severe pain. 40 tablet 0  . HYDROmorphone (DILAUDID) 2 MG tablet Take 1 tablet (2 mg total) by mouth every 6 (six) hours as needed for severe pain. 20 tablet 0  . JANUMET XR [phone] MG TB24 TAKE 1 TABLET BY MOUTH DAILY. NEED OFFICE VISIT OR PHONE VISIT WITH PCP FOR MORE REFILLS. (Patient taking differently: Take 1 tablet by mouth daily. ) 90 tablet 1  . lisinopril (PRINIVIL,ZESTRIL) 10 MG tablet TAKE 1 TABLET BY MOUTH DAILY. 90 tablet 1  . metoprolol (LOPRESSOR) 50 MG tablet Take 1 tablet by mouth 2 (two) times daily.    Marland Kitchen MYRBETRIQ 25 MG TB24 tablet TAKE 1 TABLET BY MOUTH DAILY. 90 tablet 3  . ONETOUCH DELICA LANCETS FINE MISC  Use qd 100 each 3  . methocarbamol (ROBAXIN) 500 MG tablet Take 1 tablet (500 mg total) by mouth every 8 (eight) hours as needed for muscle spasms. (Patient not taking: Reported on 06/08/2019) 30 tablet 0  . sildenafil (VIAGRA) 100 MG tablet Take 1 tablet (100 mg total) by mouth as needed for erectile dysfunction. Take 100 mg by mouth daily as needed for Erectile Dysfunction. 10 tablet 2  . traMADol (ULTRAM) 50 MG tablet Take 1 tablet (50 mg total) by mouth every 6 (six) hours as needed. 30 tablet 0   No current facility-administered medications for this visit.     Patient Active Problem List   Diagnosis Date Noted  . S/P total knee arthroplasty, left 05/18/2019  . Osteoarthritis of left knee 05/04/2019  . Unilateral primary osteoarthritis, left knee 04/20/2019  . Hyperlipidemia associated with type 2 diabetes mellitus ([place]) 05/19/2018  . Degenerative arthritis of knee, bilateral 06/20/2017  . Left inguinal pain 11/06/2015  . Dysuria 03/27/2015  . Fatigue 03/27/2015  . Benign paroxysmal positional vertigo 04/22/2014  . Dizziness 04/22/2014  . Primary localized osteoarthrosis, lower leg 09/15/2013  . Obesity (BMI 30-39.9) 08/19/2013  . Other and unspecified hyperlipidemia 02/19/2013  . HLD (hyperlipidemia) 03/12/2012  . Tachycardia 03/06/2012  . Inguinal hernia unilateral, non-recurrent 08/02/2011  . Inguinal hernia 06/26/2011  . Preventative health care 02/01/2011  . DM (diabetes mellitus) type II uncontrolled, periph vascular disorder ([place]) 10/20/2009  . CARPAL TUNNEL SYNDROME, BILATERAL 10/03/2009  . ERECTILE DYSFUNCTION, ORGANIC, HX OF 02/06/2009  . DEPRESSION 01/20/2008  . Essential hypertension 01/20/2008  . URI 06/25/2007  . TRANSAMINASES, SERUM, ELEVATED 02/02/2007   Past Medical History:  Diagnosis Date  . Anxiety   . Arthritis    bilateral knees  . Depression   . DM2 (diabetes mellitus, type 2) ([facility])   . Dysrhythmia     benign tachycardia, takes [place]  .  Elevated LFTs    previously evaluated by Dr Sharlett Iles; h/o "fatty liver"  . GERD (gastroesophageal reflux disease)   . HLD (hyperlipidemia)   . Hypertension   . Inguinal hernia    right  . Squamous cell carcinoma    top of head    Family History  Problem Relation Age of Onset  . Diabetes Mother   . Hypertension Mother   . Heart disease Mother   . Heart failure Mother   . Stroke Father 45  . Parkinsonism Father   . Depression Brother   . Alcohol abuse Brother   . Cancer Maternal Grandmother        bone  .  Colon cancer Neg Hx   . Colon polyps Neg Hx   . Esophageal cancer Neg Hx   . Rectal cancer Neg Hx   . Stomach cancer Neg Hx     Past Surgical History:  Procedure Laterality Date  . ACHILLES TENDON REPAIR  1999  . CARPAL TUNNEL RELEASE Right 11/04/2014   Procedure: LIMITED OPEN RIGHT CARPAL TUNNEL RELEASE;  Surgeon: Roseanne Kaufman, MD;  Location: [facility];  Service: Orthopedics;  Laterality: Right;  . CARPAL TUNNEL WITH CUBITAL TUNNEL Left 07/22/2014   Procedure: LEFT CARPAL TUNNEL RELEASE AND CUBITAL TUNNEL RELEASE TRANSPOSITION;  Surgeon: Roseanne Kaufman, MD;  Location: [facility];  Service: Orthopedics;  Laterality: Left;  . COLONOSCOPY    . HERNIA REPAIR  07/2011   left side  . INGUINAL HERNIA REPAIR  08/09/2011   Procedure: LAPAROSCOPIC INGUINAL HERNIA;  Surgeon: Joyice Faster. Cornett, MD;  [facility];  Service: General;  Laterality: Right;  . KNEE ARTHROSCOPY  1993   left knee  . KNEE SURGERY     age 74- right  . SQUAMOUS CELL CARCINOMA EXCISION     left knee  . TOTAL KNEE ARTHROPLASTY Left 05/04/2019   Procedure: LEFT TOTAL KNEE ARTHROPLASTY;  Surgeon: Garald Balding, MD;  [facility];  Service: Orthopedics;  Laterality: Left;   Social History   Occupational History  . 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 Smoking status: Current Some Day Smoker    Types: Cigars  . Smokeless tobacco:  Never Used  . Tobacco comment: smokes one every other day  Substance and Sexual Activity  . Alcohol use: Yes    Alcohol/week: 3.0 - 4.0 standard drinks    Types: 3 - 4 Cans of beer per week    Comment: social  . Drug use: No  . Sexual activity: Yes    Partners: Female

## 2019-07-06 ENCOUNTER — Ambulatory Visit: Payer: PRIVATE HEALTH INSURANCE | Admitting: Orthopedic Surgery

## 2019-07-06 ENCOUNTER — Ambulatory Visit (INDEPENDENT_AMBULATORY_CARE_PROVIDER_SITE_OTHER): Payer: PRIVATE HEALTH INSURANCE | Admitting: Orthopaedic Surgery

## 2019-07-06 ENCOUNTER — Other Ambulatory Visit: Payer: Self-pay

## 2019-07-06 ENCOUNTER — Encounter: Payer: Self-pay | Admitting: Orthopaedic Surgery

## 2019-07-06 VITALS — Ht 68.0 in | Wt 219.0 lb

## 2019-07-06 DIAGNOSIS — Z96652 Presence of left artificial knee joint: Secondary | ICD-10-CM

## 2019-07-06 DIAGNOSIS — M1712 Unilateral primary osteoarthritis, left knee: Secondary | ICD-10-CM

## 2019-07-06 NOTE — Progress Notes (Signed)
Office Visit Note   Patient: Charles Trujillo           Date of Birth: 1951-07-08           MRN: [id number] Visit Date: 07/06/2019              Requested by: [address] [place], [place] [address] [place],  [place] [address] PCP: Carollee Herter, Alferd Apa, DO   Assessment & Plan: Visit Diagnoses:  1. Unilateral primary osteoarthritis, left knee   2. S/P total knee arthroplasty, left     Plan: 2 months status post primary left total knee replacement doing very well.  Still uses a cane but has minimal discomfort.  Full extension of flexed over 105 degrees with minimal effusion.  Alignment looks great whereas he had significant varus preoperatively.  We will urge him to continue with his exercises.  Will return to work on January 4 half days for a week.  He works at [employer].  Will reevaluate in 3 months.  Follow-Up Instructions: Return in about 3 months (around 10/04/2019).   Orders:  No orders of the defined types were placed in this encounter.  No orders of the defined types were placed in this encounter.     Procedures: No procedures performed   Clinical Data: No additional findings.   Subjective: Chief Complaint  Patient presents with  . Left Knee - Follow-up    Left TKA DOS 05/04/2019  Patient presents today for follow up on his left knee. He had a left total knee arthroplasty on 05/04/2019. He is now 9 weeks out from surgery. He is doing home exercises. He is taking tylenol as needed.   HPI  Review of Systems   Objective: Vital Signs: Ht 5\' 8"  (1.727 m)   Wt 219 lb (99.3 kg)   BMI 33.30 kg/m   Physical Exam  Ortho Exam left knee with minimal effusion.  The knee was not hot warm or red.  Excellent alignment with full extension about 105 degrees of flexion.  Knee was nice and tight.  No calf pain.  No popliteal pain or mass.  Neurologically intact.  Still has some quad atrophy  Specialty Comments:  No specialty comments  available.  Imaging: No results found.   PMFS History: Patient Active Problem List   Diagnosis Date Noted  . S/P total knee arthroplasty, left 05/18/2019  . Osteoarthritis of left knee 05/04/2019  . Unilateral primary osteoarthritis, left knee 04/20/2019  . Hyperlipidemia associated with type 2 diabetes mellitus ([place]) 05/19/2018  . Degenerative arthritis of knee, bilateral 06/20/2017  . Left inguinal pain 11/06/2015  . Dysuria 03/27/2015  . Fatigue 03/27/2015  . Benign paroxysmal positional vertigo 04/22/2014  . Dizziness 04/22/2014  . Primary localized osteoarthrosis, lower leg 09/15/2013  . Obesity (BMI 30-39.9) 08/19/2013  . Other and unspecified hyperlipidemia 02/19/2013  . HLD (hyperlipidemia) 03/12/2012  . Tachycardia 03/06/2012  . Inguinal hernia unilateral, non-recurrent 08/02/2011  . Inguinal hernia 06/26/2011  . Preventative health care 02/01/2011  . DM (diabetes mellitus) type II uncontrolled, periph vascular disorder ([place]) 10/20/2009  . CARPAL TUNNEL SYNDROME, BILATERAL 10/03/2009  . ERECTILE DYSFUNCTION, ORGANIC, HX OF 02/06/2009  . DEPRESSION 01/20/2008  . Essential hypertension 01/20/2008  . URI 06/25/2007  . TRANSAMINASES, SERUM, ELEVATED 02/02/2007   Past Medical History:  Diagnosis Date  . Anxiety   . Arthritis    bilateral knees  . Depression   . DM2 (diabetes mellitus, type 2) ([place])   .  Dysrhythmia     benign tachycardia, takes [place]  . Elevated LFTs    previously evaluated by Dr Sharlett Iles; h/o "fatty liver"  . GERD (gastroesophageal reflux disease)   . HLD (hyperlipidemia)   . Hypertension   . Inguinal hernia    right  . Squamous cell carcinoma    top of head    Family History  Problem Relation Age of Onset  . Diabetes Mother   . Hypertension Mother   . Heart disease Mother   . Heart failure Mother   . Stroke Father 22  . Parkinsonism Father   . Depression Brother   . Alcohol abuse Brother   . Cancer Maternal Grandmother         bone  . Colon cancer Neg Hx   . Colon polyps Neg Hx   . Esophageal cancer Neg Hx   . Rectal cancer Neg Hx   . Stomach cancer Neg Hx     Past Surgical History:  Procedure Laterality Date  . ACHILLES TENDON REPAIR  1999  . CARPAL TUNNEL RELEASE Right 11/04/2014   Procedure: LIMITED OPEN RIGHT CARPAL TUNNEL RELEASE;  Surgeon: Roseanne Kaufman, MD;  Location: [facility];  Service: Orthopedics;  Laterality: Right;  . CARPAL TUNNEL WITH CUBITAL TUNNEL Left 07/22/2014   Procedure: LEFT CARPAL TUNNEL RELEASE AND CUBITAL TUNNEL RELEASE TRANSPOSITION;  Surgeon: Roseanne Kaufman, MD;  Location: [facility];  Service: Orthopedics;  Laterality: Left;  . COLONOSCOPY    . HERNIA REPAIR  07/2011   left side  . INGUINAL HERNIA REPAIR  08/09/2011   Procedure: LAPAROSCOPIC INGUINAL HERNIA;  Surgeon: Joyice Faster. Cornett, MD;  [facility];  Service: General;  Laterality: Right;  . KNEE ARTHROSCOPY  1993   left knee  . KNEE SURGERY     age 65- right  . SQUAMOUS CELL CARCINOMA EXCISION     left knee  . TOTAL KNEE ARTHROPLASTY Left 05/04/2019   Procedure: LEFT TOTAL KNEE ARTHROPLASTY;  Surgeon: Garald Balding, MD;  [facility];  Service: Orthopedics;  Laterality: Left;   Social History   Occupational History  . 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 Smoking status: Current Some Day Smoker    Types: Cigars  . Smokeless tobacco: Never Used  . Tobacco comment: smokes one every other day  Substance and Sexual Activity  . Alcohol use: Yes    Alcohol/week: 3.0 - 4.0 standard drinks    Types: 3 - 4 Cans of beer per week    Comment: social  . Drug use: No  . Sexual activity: Yes    Partners: Female

## 2019-07-25 ENCOUNTER — Other Ambulatory Visit: Payer: Self-pay | Admitting: Family Medicine

## 2019-07-25 DIAGNOSIS — I10 Essential (primary) hypertension: Secondary | ICD-10-CM

## 2019-08-16 ENCOUNTER — Encounter: Payer: Self-pay | Admitting: Family Medicine

## 2019-08-19 LAB — HM DIABETES EYE EXAM

## 2019-08-26 ENCOUNTER — Other Ambulatory Visit: Payer: Self-pay | Admitting: Family Medicine

## 2019-09-26 ENCOUNTER — Other Ambulatory Visit: Payer: Self-pay | Admitting: Family Medicine

## 2019-09-26 DIAGNOSIS — I10 Essential (primary) hypertension: Secondary | ICD-10-CM

## 2019-09-27 ENCOUNTER — Other Ambulatory Visit: Payer: Self-pay

## 2019-09-27 ENCOUNTER — Ambulatory Visit: Payer: PRIVATE HEALTH INSURANCE | Admitting: Family Medicine

## 2019-09-27 ENCOUNTER — Encounter: Payer: Self-pay | Admitting: Family Medicine

## 2019-09-27 VITALS — BP 130/70 | HR 78 | Temp 97.4°F | Resp 18 | Ht 68.0 in | Wt 207.2 lb

## 2019-09-27 DIAGNOSIS — E1165 Type 2 diabetes mellitus with hyperglycemia: Secondary | ICD-10-CM

## 2019-09-27 DIAGNOSIS — I10 Essential (primary) hypertension: Secondary | ICD-10-CM | POA: Diagnosis not present

## 2019-09-27 DIAGNOSIS — E1169 Type 2 diabetes mellitus with other specified complication: Secondary | ICD-10-CM

## 2019-09-27 DIAGNOSIS — E1151 Type 2 diabetes mellitus with diabetic peripheral angiopathy without gangrene: Secondary | ICD-10-CM | POA: Diagnosis not present

## 2019-09-27 DIAGNOSIS — E785 Hyperlipidemia, unspecified: Secondary | ICD-10-CM

## 2019-09-27 DIAGNOSIS — IMO0002 Reserved for concepts with insufficient information to code with codable children: Secondary | ICD-10-CM

## 2019-09-27 NOTE — Assessment & Plan Note (Signed)
hgba1c to be checked, minimize simple carbs. Increase exercise as tolerated. Continue current meds  

## 2019-09-27 NOTE — Assessment & Plan Note (Signed)
Well controlled, no changes to meds. Encouraged heart healthy diet such as the DASH diet and exercise as tolerated.  °

## 2019-09-27 NOTE — Progress Notes (Signed)
Patient ID: Charles Trujillo, male    DOB: [birth year]  Age: 69 y.o. MRN: [id number]    Subjective:  Subjective  HPI Lorne Skeens Haecker presents for f/u dm , chol, bp  HYPERTENSION   Blood pressure range-not checking   Chest pain- no      Dyspnea- no Lightheadedness- no   Edema- no  Other side effects - no   Medication compliance: good Low salt diet- yes     DIABETES    Blood Sugar ranges-133  Polyuria- no New Visual problems- no  Hypoglycemic symptoms- no  Other side effects-no Medication compliance - good Last eye exam- last month Foot exam- today   HYPERLIPIDEMIA  Medication compliance- good RUQ pain- no  Muscle aches- no Other side effects-no      Review of Systems  Constitutional: Negative.   HENT: Negative for congestion, ear pain, hearing loss, nosebleeds, postnasal drip, rhinorrhea, sinus pressure, sneezing and tinnitus.   Eyes: Negative for photophobia, discharge, itching and visual disturbance.  Respiratory: Negative.   Cardiovascular: Negative.   Gastrointestinal: Negative for abdominal distention, abdominal pain, anal bleeding, blood in stool and constipation.  Endocrine: Negative.   Genitourinary: Negative.   Musculoskeletal: Negative.   Skin: Negative.   Allergic/Immunologic: Negative.   Neurological: Negative for dizziness, weakness, light-headedness, numbness and headaches.  Psychiatric/Behavioral: Negative for agitation, confusion, decreased concentration, dysphoric mood, sleep disturbance and suicidal ideas. The patient is not nervous/anxious.     History Past Medical History:  Diagnosis Date  . Anxiety   . Arthritis    bilateral knees  . Depression   . DM2 (diabetes mellitus, type 2) ([place])   . Dysrhythmia     benign tachycardia, takes [place]  . Elevated LFTs    previously evaluated by Dr Sharlett Iles; h/o "fatty liver"  . GERD (gastroesophageal reflux disease)   . HLD (hyperlipidemia)   . Hypertension   . Inguinal hernia    right    . Squamous cell carcinoma    top of head    He has a past surgical history that includes Knee surgery; Achilles tendon repair (1999); Squamous cell carcinoma excision; Knee arthroscopy (1993); Inguinal hernia repair (08/09/2011); Hernia repair (07/2011); Carpal tunnel with cubital tunnel (Left, 07/22/2014); Carpal tunnel release (Right, 11/04/2014); Colonoscopy; and Total knee arthroplasty (Left, 05/04/2019).   His family history includes Alcohol abuse in his brother; Cancer in his maternal grandmother; Depression in his brother; Diabetes in his mother; Heart disease in his mother; Heart failure in his mother; Hypertension in his mother; Parkinsonism in his father; Stroke (age of onset: 79) in his father.He reports that he has been smoking cigars. He has never used smokeless tobacco. He reports current alcohol use of about 3.0 - 4.0 standard drinks of alcohol per week. He reports that he does not use drugs.  Current Outpatient Medications on File Prior to Visit  Medication Sig Dispense Refill  . aspirin EC 325 MG EC tablet Take 1 tablet (325 mg total) by mouth daily with breakfast. 30 tablet 0  . atorvastatin (LIPITOR) 20 MG tablet TAKE 1 TABLET BY MOUTH DAILY. 90 tablet 1  . diltiazem (CARDIZEM CD) 120 MG 24 hr capsule TAKE 1 CAPSULE BY MOUTH EVERY DAY 90 capsule 1  . glucose blood (ONE TOUCH TEST STRIPS) test strip Check blood sugar twice daily 100 each 11  . glucose blood test strip 1 each by Other route 2 (two) times daily. accu check    . HYDROmorphone (DILAUDID) 2 MG tablet Take 1 tablet (  2 mg total) by mouth every 4 (four) hours as needed for severe pain. 40 tablet 0  . HYDROmorphone (DILAUDID) 2 MG tablet Take 1 tablet (2 mg total) by mouth every 6 (six) hours as needed for severe pain. 20 tablet 0  . JANUMET XR [phone] MG TB24 TAKE 1 TABLET BY MOUTH DAILY. NEED OFFICE VISIT OR PHONE VISIT WITH PCP FOR MORE REFILLS. (Patient taking differently: Take 1 tablet by mouth daily. ) 90 tablet 1  .  lisinopril (ZESTRIL) 10 MG tablet TAKE 1 TABLET BY MOUTH DAILY. 90 tablet 3  . metoprolol (LOPRESSOR) 50 MG tablet Take 1 tablet by mouth 2 (two) times daily.    [name] DELICA LANCETS FINE MISC Use qd 100 each 3  . traMADol (ULTRAM) 50 MG tablet Take 1 tablet (50 mg total) by mouth every 6 (six) hours as needed. 30 tablet 0   No current facility-administered medications on file prior to visit.     Objective:  Objective  Physical Exam Vitals and nursing note reviewed.  Constitutional:      General: He is sleeping. He is not in acute distress.    Appearance: He is well-developed.  HENT:     Head: Normocephalic and atraumatic.  Eyes:     Pupils: Pupils are equal, round, and reactive to light.  Neck:     Thyroid: No thyromegaly.  Cardiovascular:     Rate and Rhythm: Normal rate and regular rhythm.     Heart sounds: Normal heart sounds. No murmur.  Pulmonary:     Effort: Pulmonary effort is normal. No respiratory distress.     Breath sounds: Normal breath sounds. No wheezing or rales.  Chest:     Chest wall: No tenderness.  Musculoskeletal:        General: No tenderness.     Cervical back: Normal range of motion and neck supple.  Skin:    General: Skin is warm and dry.  Neurological:     Mental Status: He is oriented to person, place, and time.  Psychiatric:        Behavior: Behavior normal.        Thought Content: Thought content normal.        Judgment: Judgment normal.    Diabetic Foot Exam - Simple   Simple Foot Form Diabetic Foot exam was performed with the following findings: Yes 09/27/2019  4:27 PM  Visual Inspection No deformities, no ulcerations, no other skin breakdown bilaterally: Yes Sensation Testing Intact to touch and monofilament testing bilaterally: Yes Pulse Check Posterior Tibialis and Dorsalis pulse intact bilaterally: Yes Comments     BP 130/70 (BP Location: Left Arm, Patient Position: Sitting, Cuff Size: Large)   Pulse 78   Temp (!) 97.4 F  (36.3 C) (Temporal)   Resp 18   Ht 5\' 8"  (1.727 m)   Wt 207 lb 3.2 oz (94 kg)   SpO2 97%   BMI 31.50 kg/m  Wt Readings from Last 3 Encounters:  09/27/19 207 lb 3.2 oz (94 kg)  07/06/19 219 lb (99.3 kg)  06/08/19 219 lb (99.3 kg)     Lab Results  Component Value Date   WBC 14.2 (H) 05/05/2019   HGB 12.3 (L) 05/05/2019   HCT 37.6 (L) 05/05/2019   PLT 221 05/05/2019   GLUCOSE 259 (H) 05/05/2019   CHOL 149 02/05/2019   TRIG 68.0 02/05/2019   HDL 67.50 02/05/2019   LDLDIRECT 143.5 02/01/2011   LDLCALC 68 02/05/2019   ALT 58 (H)  04/30/2019   AST 46 (H) 04/30/2019   NA 132 (L) 05/05/2019   K 5.6 (H) 05/05/2019   CL 103 05/05/2019   CREATININE 1.43 (H) 05/05/2019   BUN 22 05/05/2019   CO2 19 (L) 05/05/2019   TSH 5.50 (H) 12/09/2017   PSA 0.33 04/03/2012   INR 1.1 04/30/2019   HGBA1C 6.6 (H) 05/04/2019   MICROALBUR 1.0 11/01/2014    DG Chest 2 View  Result Date: 04/30/2019 CLINICAL DATA:  Preop exam for knee replacement. No chest complaints. EXAM: CHEST - 2 VIEW COMPARISON:  08/05/2011. FINDINGS: The heart size and mediastinal contours are within normal limits. Both lungs are clear. No pleural effusion or pneumothorax. The visualized skeletal structures are unremarkable. IMPRESSION: No active cardiopulmonary disease. Electronically Signed   By: Lajean Manes M.D.   On: 04/30/2019 13:56     Assessment & Plan:  Plan  I have discontinued Lorne Skeens. Gaymon "BILL"'s sildenafil, Myrbetriq, and methocarbamol. I am also having him maintain his metoprolol tartrate, glucose blood, OneTouch Delica Lancets Fine, glucose blood, diltiazem, Janumet XR, aspirin, HYDROmorphone, HYDROmorphone, traMADol, lisinopril, and atorvastatin.  No orders of the defined types were placed in this encounter.   Problem List Items Addressed This Visit      Unprioritized   DM (diabetes mellitus) type II uncontrolled, periph vascular disorder ([place])    hgba1c to be checked , minimize simple carbs.  Increase exercise as tolerated. Continue current meds       Essential hypertension    Well controlled, no changes to meds. Encouraged heart healthy diet such as the DASH diet and exercise as tolerated.       Relevant Orders   Lipid panel   Comprehensive metabolic panel   Hemoglobin A1c   Hyperlipidemia associated with type 2 diabetes mellitus ([place])    Tolerating statin, encouraged heart healthy diet, avoid trans fats, minimize simple carbs and saturated fats. Increase exercise as tolerated       Other Visit Diagnoses    Uncontrolled type 2 diabetes mellitus with hyperglycemia ([place])    -  Primary   Relevant Orders   Lipid panel   Comprehensive metabolic panel   Hemoglobin A1c      Follow-up: Return in about 6 months (around 03/29/2020), or if symptoms worsen or fail to improve, for annual exam, fasting.  [name], DO

## 2019-09-27 NOTE — Patient Instructions (Signed)
Carbohydrate Counting for Diabetes Mellitus, Adult  Carbohydrate counting is a method of keeping track of how many carbohydrates you eat. Eating carbohydrates naturally increases the amount of sugar (glucose) in the blood. Counting how many carbohydrates you eat helps keep your blood glucose within normal limits, which helps you manage your diabetes (diabetes mellitus). It is important to know how many carbohydrates you can safely have in each meal. This is different for every person. A diet and nutrition specialist (registered dietitian) can help you make a meal plan and calculate how many carbohydrates you should have at each meal and snack. Carbohydrates are found in the following foods:  Grains, such as breads and cereals.  Dried beans and soy products.  Starchy vegetables, such as potatoes, peas, and corn.  Fruit and fruit juices.  Milk and yogurt.  Sweets and snack foods, such as cake, cookies, candy, chips, and soft drinks. How do I count carbohydrates? There are two ways to count carbohydrates in food. You can use either of the methods or a combination of both. Reading "Nutrition Facts" on packaged food The "Nutrition Facts" list is included on the labels of almost all packaged foods and beverages in the [place] It includes:  The serving size.  Information about nutrients in each serving, including the grams (g) of carbohydrate per serving. To use the "Nutrition Facts":  Decide how many servings you will have.  Multiply the number of servings by the number of carbohydrates per serving.  The resulting number is the total amount of carbohydrates that you will be having. Learning standard serving sizes of other foods When you eat carbohydrate foods that are not packaged or do not include "Nutrition Facts" on the label, you need to measure the servings in order to count the amount of carbohydrates:  Measure the foods that you will eat with a food scale or measuring cup, if  needed.  Decide how many standard-size servings you will eat.  Multiply the number of servings by 15. Most carbohydrate-rich foods have about 15 g of carbohydrates per serving. ? For example, if you eat 8 oz (170 g) of strawberries, you will have eaten 2 servings and 30 g of carbohydrates (2 servings x 15 g = 30 g).  For foods that have more than one food mixed, such as soups and casseroles, you must count the carbohydrates in each food that is included. The following list contains standard serving sizes of common carbohydrate-rich foods. Each of these servings has about 15 g of carbohydrates:   hamburger bun or  English muffin.   oz (15 mL) syrup.   oz (14 g) jelly.  1 slice of bread.  1 six-inch tortilla.  3 oz (85 g) cooked rice or pasta.  4 oz (113 g) cooked dried beans.  4 oz (113 g) starchy vegetable, such as peas, corn, or potatoes.  4 oz (113 g) hot cereal.  4 oz (113 g) mashed potatoes or  of a large baked potato.  4 oz (113 g) canned or frozen fruit.  4 oz (120 mL) fruit juice.  4-6 crackers.  6 chicken nuggets.  6 oz (170 g) unsweetened dry cereal.  6 oz (170 g) plain fat-free yogurt or yogurt sweetened with artificial sweeteners.  8 oz (240 mL) milk.  8 oz (170 g) fresh fruit or one small piece of fruit.  24 oz (680 g) popped popcorn. Example of carbohydrate counting Sample meal  3 oz (85 g) chicken breast.  6 oz (170 g)   brown rice.  4 oz (113 g) corn.  8 oz (240 mL) milk.  8 oz (170 g) strawberries with sugar-free whipped topping. Carbohydrate calculation 1. Identify the foods that contain carbohydrates: ? Rice. ? Corn. ? Milk. ? Strawberries. 2. Calculate how many servings you have of each food: ? 2 servings rice. ? 1 serving corn. ? 1 serving milk. ? 1 serving strawberries. 3. Multiply each number of servings by 15 g: ? 2 servings rice x 15 g = 30 g. ? 1 serving corn x 15 g = 15 g. ? 1 serving milk x 15 g = 15 g. ? 1  serving strawberries x 15 g = 15 g. 4. Add together all of the amounts to find the total grams of carbohydrates eaten: ? 30 g + 15 g + 15 g + 15 g = 75 g of carbohydrates total. Summary  Carbohydrate counting is a method of keeping track of how many carbohydrates you eat.  Eating carbohydrates naturally increases the amount of sugar (glucose) in the blood.  Counting how many carbohydrates you eat helps keep your blood glucose within normal limits, which helps you manage your diabetes.  A diet and nutrition specialist (registered dietitian) can help you make a meal plan and calculate how many carbohydrates you should have at each meal and snack. This information is not intended to replace advice given to you by your health care provider. Make sure you discuss any questions you have with your health care provider. Document Revised: 01/23/2017 Document Reviewed: 12/13/2015 Elsevier Patient Education  2020 Elsevier Inc.  

## 2019-09-27 NOTE — Assessment & Plan Note (Signed)
Tolerating statin, encouraged heart healthy diet, avoid trans fats, minimize simple carbs and saturated fats. Increase exercise as tolerated 

## 2019-09-28 LAB — LIPID PANEL
Cholesterol: 126 mg/dL (ref 0–200)
HDL: 48.7 mg/dL (ref >39.00)
LDL Cholesterol: 62 mg/dL (ref 0–99)
NonHDL: 77.42
Total CHOL/HDL Ratio: 3
Triglycerides: 77 mg/dL (ref 0.0–149.0)
VLDL: 15.4 mg/dL (ref 0.0–40.0)

## 2019-09-28 LAB — COMPREHENSIVE METABOLIC PANEL
ALT: 37 U/L (ref 0–53)
AST: 36 U/L (ref 0–37)
Albumin: 3.9 g/dL (ref 3.5–5.2)
Alkaline Phosphatase: 55 U/L (ref 39–117)
BUN: 17 mg/dL (ref 6–23)
CO2: 27 mEq/L (ref 19–32)
Calcium: 9.1 mg/dL (ref 8.4–10.5)
Chloride: 102 mEq/L (ref 96–112)
Creatinine, Ser: 1.09 mg/dL (ref 0.40–1.50)
GFR: 66.83 mL/min (ref >60.00)
Glucose, Bld: 124 mg/dL — ABNORMAL HIGH (ref 70–99)
Potassium: 4.4 mEq/L (ref 3.5–5.1)
Sodium: 135 mEq/L (ref 135–145)
Total Bilirubin: 0.5 mg/dL (ref 0.2–1.2)
Total Protein: 6.8 g/dL (ref 6.0–8.3)

## 2019-09-28 LAB — HEMOGLOBIN A1C: Hgb A1c MFr Bld: 6.8 % — ABNORMAL HIGH (ref 4.6–6.5)

## 2019-10-13 ENCOUNTER — Other Ambulatory Visit: Payer: Self-pay

## 2019-10-13 ENCOUNTER — Ambulatory Visit (INDEPENDENT_AMBULATORY_CARE_PROVIDER_SITE_OTHER): Payer: PRIVATE HEALTH INSURANCE | Admitting: Orthopaedic Surgery

## 2019-10-13 ENCOUNTER — Ambulatory Visit (INDEPENDENT_AMBULATORY_CARE_PROVIDER_SITE_OTHER): Payer: PRIVATE HEALTH INSURANCE

## 2019-10-13 ENCOUNTER — Encounter: Payer: Self-pay | Admitting: Orthopaedic Surgery

## 2019-10-13 VITALS — Ht 68.0 in | Wt 207.0 lb

## 2019-10-13 DIAGNOSIS — M25571 Pain in right ankle and joints of right foot: Secondary | ICD-10-CM | POA: Insufficient documentation

## 2019-10-13 DIAGNOSIS — Z96652 Presence of left artificial knee joint: Secondary | ICD-10-CM | POA: Diagnosis not present

## 2019-10-13 NOTE — Progress Notes (Signed)
Office Visit Note   Patient: Charles Trujillo           Date of Birth: 1951-03-29           MRN: [id number] Visit Date: 10/13/2019              Requested by: [address] [place], [place] [address] [place],  [place] [id number] PCP: Carollee Herter, Alferd Apa, DO   Assessment & Plan: Visit Diagnoses:  1. Pain in right ankle and joints of right foot   2. Total knee replacement status, left     Plan: 5 months status post primary left total knee replacement doing quite well without any issues.  Good strength and excellent range of motion and no specific pain. Advanced osteoarthritis right knee for which he uses a spider brace and seems to be function fairly well.  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  Long discussion regarding treatment of the above for the moment he is doing fine with bracing and over-the-counter medicines.  At some point he is be a good candidate for a right total knee replacement and eventually may be even a right ankle replacement  Follow-Up Instructions: No follow-ups on file.   Orders:  Orders Placed This Encounter  Procedures  . XR Ankle Complete Right   No orders of the defined types were placed in this encounter.     Procedures: No procedures performed   Clinical Data: No additional findings.   Subjective: Chief Complaint  Patient presents with  . Left Knee - Pain    Left TKA 05/04/2019  Patient presents today for a three month follow up on his left knee. He had a left total knee arthroplasty on 05/04/2019. He is now 5 months out from surgery.Doing well. No complaints. Mr. Mcginity also relates that he has had some issues with his right ankle.  He does remember specific injury or trauma but notes that he has played a lot of tennis over the years.  Sometimes ankle will swell and be uncomfortable.  No numbness or tingling.  Overall he rates his pain in the ankle is a 2.  Mr. Hutchings also has advanced osteoarthritis of his  right knee with severe varus.  He does wear a spider brace and notes that it makes a big difference.  He realizes at some point in future he might require knee replacement but for the moment he is not that compromised  HPI  Review of Systems   Objective: Vital Signs: Ht 5\' 8"  (1.727 m)   Wt 207 lb (93.9 kg)   BMI 31.47 kg/m   Physical Exam Constitutional:      Appearance: He is well-developed.  Eyes:     Pupils: Pupils are equal, round, and reactive to light.  Pulmonary:     Effort: Pulmonary effort is normal.  Skin:    General: Skin is warm and dry.  Neurological:     Mental Status: He is alert and oriented to person, place, and time.  Psychiatric:        Behavior: Behavior normal.     Ortho Exam left knee was not hot red warm or swollen.  No instability.  Full quick extension and at least 115 degrees of flexion.  No masses.  No calf pain.  No distal edema.  No localized areas of tenderness. Right ankle with some swelling along the anterior aspect.  No redness or ecchymosis.  Neurologically intact.  No pain  about either malleolar I or behind either malleoli.  Some mild swelling behind the lateral malleolus but peroneal tendon function intact.  No crepitation.  He he does have mild loss of dorsiflexion and plantarflexion of the right ankle compared to the left but certainly not functional loss Specialty Comments:  No specialty comments available.  Imaging: No results found.   PMFS History: Patient Active Problem List   Diagnosis Date Noted  . Pain in right ankle and joints of right foot 10/13/2019  . Total knee replacement status, left 05/18/2019  . Osteoarthritis of left knee 05/04/2019  . Unilateral primary osteoarthritis, left knee 04/20/2019  . Hyperlipidemia associated with type 2 diabetes mellitus ([place]) 05/19/2018  . Degenerative arthritis of knee, bilateral 06/20/2017  . Left inguinal pain 11/06/2015  . Dysuria 03/27/2015  . Fatigue 03/27/2015  . Benign  paroxysmal positional vertigo 04/22/2014  . Dizziness 04/22/2014  . Primary localized osteoarthrosis, lower leg 09/15/2013  . Obesity (BMI 30-39.9) 08/19/2013  . Other and unspecified hyperlipidemia 02/19/2013  . HLD (hyperlipidemia) 03/12/2012  . Tachycardia 03/06/2012  . Inguinal hernia unilateral, non-recurrent 08/02/2011  . Inguinal hernia 06/26/2011  . Preventative health care 02/01/2011  . DM (diabetes mellitus) type II uncontrolled, periph vascular disorder ([place]) 10/20/2009  . CARPAL TUNNEL SYNDROME, BILATERAL 10/03/2009  . ERECTILE DYSFUNCTION, ORGANIC, HX OF 02/06/2009  . DEPRESSION 01/20/2008  . Essential hypertension 01/20/2008  . URI 06/25/2007  . TRANSAMINASES, SERUM, ELEVATED 02/02/2007   Past Medical History:  Diagnosis Date  . Anxiety   . Arthritis    bilateral knees  . Depression   . DM2 (diabetes mellitus, type 2) ([place])   . Dysrhythmia     benign tachycardia, takes [place]  . Elevated LFTs    previously evaluated by Dr Sharlett Iles; h/o "fatty liver"  . GERD (gastroesophageal reflux disease)   . HLD (hyperlipidemia)   . Hypertension   . Inguinal hernia    right  . Squamous cell carcinoma    top of head    Family History  Problem Relation Age of Onset  . Diabetes Mother   . Hypertension Mother   . Heart disease Mother   . Heart failure Mother   . Stroke Father 24  . Parkinsonism Father   . Depression Brother   . Alcohol abuse Brother   . Cancer Maternal Grandmother        bone  . Colon cancer Neg Hx   . Colon polyps Neg Hx   . Esophageal cancer Neg Hx   . Rectal cancer Neg Hx   . Stomach cancer Neg Hx     Past Surgical History:  Procedure Laterality Date  . ACHILLES TENDON REPAIR  1999  . CARPAL TUNNEL RELEASE Right 11/04/2014   Procedure: LIMITED OPEN RIGHT CARPAL TUNNEL RELEASE;  Surgeon: Roseanne Kaufman, MD;  Location: [facility];  Service: Orthopedics;  Laterality: Right;  . CARPAL TUNNEL WITH CUBITAL TUNNEL Left 07/22/2014    Procedure: LEFT CARPAL TUNNEL RELEASE AND CUBITAL TUNNEL RELEASE TRANSPOSITION;  Surgeon: Roseanne Kaufman, MD;  Location: [facility];  Service: Orthopedics;  Laterality: Left;  . COLONOSCOPY    . HERNIA REPAIR  07/2011   left side  . INGUINAL HERNIA REPAIR  08/09/2011   Procedure: LAPAROSCOPIC INGUINAL HERNIA;  Surgeon: Joyice Faster. Cornett, MD;  Location: [facility];  Service: General;  Laterality: Right;  . KNEE ARTHROSCOPY  1993   left knee  . KNEE SURGERY     age 58- right  .  SQUAMOUS CELL CARCINOMA EXCISION     left knee  . TOTAL KNEE ARTHROPLASTY Left 05/04/2019   Procedure: LEFT TOTAL KNEE ARTHROPLASTY;  Surgeon: Garald Balding, MD;  Location: [facility];  Service: Orthopedics;  Laterality: Left;   Social History   Occupational History  . 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 Smoking status: Current Some Day Smoker    Types: Cigars  . Smokeless tobacco: Never Used  . Tobacco comment: smokes one every other day  Substance and Sexual Activity  . Alcohol use: Yes    Alcohol/week: 3.0 - 4.0 standard drinks    Types: 3 - 4 Cans of beer per week    Comment: social  . Drug use: No  . Sexual activity: Yes    Partners: Female

## 2019-10-27 ENCOUNTER — Encounter: Payer: Self-pay | Admitting: Orthopaedic Surgery

## 2019-10-27 ENCOUNTER — Other Ambulatory Visit: Payer: Self-pay | Admitting: Family Medicine

## 2019-10-28 ENCOUNTER — Other Ambulatory Visit: Payer: Self-pay

## 2019-10-28 MED ORDER — AMOXICILLIN 500 MG PO TABS: ORAL_TABLET | ORAL | 0 refills | Status: DC

## 2019-11-17 ENCOUNTER — Ambulatory Visit: Payer: PRIVATE HEALTH INSURANCE | Admitting: Internal Medicine

## 2019-11-17 ENCOUNTER — Encounter: Payer: Self-pay | Admitting: Internal Medicine

## 2019-11-17 VITALS — BP 126/62 | HR 70 | Temp 98.2°F | Ht 67.0 in | Wt 207.0 lb

## 2019-11-17 DIAGNOSIS — R350 Frequency of micturition: Secondary | ICD-10-CM | POA: Diagnosis not present

## 2019-11-17 DIAGNOSIS — L309 Dermatitis, unspecified: Secondary | ICD-10-CM

## 2019-11-17 DIAGNOSIS — R198 Other specified symptoms and signs involving the digestive system and abdomen: Secondary | ICD-10-CM | POA: Diagnosis not present

## 2019-11-17 MED ORDER — NYSTATIN-TRIAMCINOLONE 100000-0.1 UNIT/GM-% EX OINT: 1.0000 "application " | TOPICAL_OINTMENT | Freq: Two times a day (BID) | CUTANEOUS | 1 refills | Status: DC

## 2019-11-17 NOTE — Progress Notes (Signed)
Charles Trujillo 69 y.o. Jan 17, 1951 [id number]  Assessment & Plan:   Encounter Diagnoses  Name Primary?   Rectal tenesmus Yes   Perianal dermatitis    Urinary frequency     I am not sure what is causing his tenesmus but with all these urinary symptoms I fully support and agree with further urologic evaluation.  Maybe there is some sort of overlap here it sounds like he has a primary urologic issue that is unresolved though the hematuria has gone.  I looked at his CT images and the bladder certainly was thick.  However antibiotics treated the hematuria so a cystoscopy was not performed.  The perianal dermatitis is noted but I do not think that is related to his rectal symptoms however I have recommended treatment and prescribed nystatin triamcinolone ointment and local care recommendations were made i.e. avoid aggressive drying with a towel etc. use a hair dryer on low to dry this area.  He does sometimes leak urine he says but I do not think that is the source for this perianal dermatitis.  I will wait to see what the urologist finds out and be available as needed.  CC: [name], DO   Subjective:   Chief Complaint: Tenesmus  HPI Charles Trujillo is here with complaints of an urge to defecate but unable to produce a bowel movement essentially what sounds like tenesmus.  This is been going on for several months and he is also having a lot of urinary frequency.  He saw urology in the fall and he had had some hematuria which responded to antibiotics, CT scanning had shown a thick-walled bladder but since the hematuria resolved with antibiotics a cystoscopy was not done.  He continues to have urinary frequency throughout the day but it is not in conjunction with these rectal sensations of tenesmus and an intermittent uncomfortable or tingling feeling in that area.  Normal bowel movements otherwise occasionally decreased caliber he says.  Colonoscopy 2019 diverticulosis in the sigmoid  otherwise negative.  He does not have complaints of erectile dysfunction proctalgia fugax.  He is going to see a urologist at [facility] where he works, for another opinion. Allergies  Allergen Reactions   Codeine Nausea Only   Codeine Phosphate     REACTION: nausea   Current Meds  Medication Sig   amoxicillin (AMOXIL) 500 MG tablet Take 4 tablets by mouth one hour prior to any dental procedures.   atorvastatin (LIPITOR) 20 MG tablet TAKE 1 TABLET BY MOUTH DAILY.   diltiazem (CARDIZEM CD) 120 MG 24 hr capsule TAKE 1 CAPSULE BY MOUTH DAILY   glucose blood (ONE TOUCH TEST STRIPS) test strip Check blood sugar twice daily   glucose blood test strip 1 each by Other route 2 (two) times daily. accu check   JANUMET XR [phone] MG TB24 TAKE 1 TABLET BY MOUTH DAILY.   lisinopril (ZESTRIL) 10 MG tablet TAKE 1 TABLET BY MOUTH DAILY.   metoprolol (LOPRESSOR) 50 MG tablet Take 1 tablet by mouth 2 (two) times daily.   ONETOUCH DELICA LANCETS FINE MISC Use qd   Past Medical History:  Diagnosis Date   Anxiety    Arthritis    bilateral knees   Depression    DM2 (diabetes mellitus, type 2) (HCC)    Dysrhythmia     benign tachycardia, takes [place]   Elevated LFTs    previously evaluated by Dr Sharlett Iles; h/o "fatty liver"   GERD (gastroesophageal reflux disease)  HLD (hyperlipidemia)    Hypertension    Inguinal hernia    right   Squamous cell carcinoma    top of head   Past Surgical History:  Procedure Laterality Date   ACHILLES TENDON REPAIR  1999   CARPAL TUNNEL RELEASE Right 11/04/2014   Procedure: LIMITED OPEN RIGHT CARPAL TUNNEL RELEASE;  Surgeon: Roseanne Kaufman, MD;  Location: [facility];  Service: Orthopedics;  Laterality: Right;   CARPAL TUNNEL WITH CUBITAL TUNNEL Left 07/22/2014   Procedure: LEFT CARPAL TUNNEL RELEASE AND CUBITAL TUNNEL RELEASE TRANSPOSITION;  Surgeon: Roseanne Kaufman, MD;  Location: [facility];  Service:  Orthopedics;  Laterality: Left;   COLONOSCOPY     HERNIA REPAIR  07/2011   left side   INGUINAL HERNIA REPAIR  08/09/2011   Procedure: LAPAROSCOPIC INGUINAL HERNIA;  Surgeon: Joyice Faster. 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   Review of Systems  As above Objective:   Physical Exam BP 126/62    Pulse 70    Temp 98.2 F (36.8 C)    Ht 5\' 7"  (1.702 m)    Wt 207 lb (93.9 kg)    BMI 32.42 kg/m  NAD  Rectum  Dermatitis - see image  NL DRE NL sphincter at rest and w/ vol squeeze NL sim def NL prostate

## 2019-11-17 NOTE — Patient Instructions (Signed)
We have sent the following medications to your pharmacy for you to pick up at your convenience: Mycolog ointment   Have your Doctor send [place] a copy of your note after your June urology visit.   Use the hair dryer on low and avoid aggressive drying after bowel movements.   I appreciate the opportunity to care for you. Silvano Rusk, MD, [facility]

## 2020-02-15 ENCOUNTER — Telehealth: Payer: Self-pay

## 2020-02-15 NOTE — Telephone Encounter (Signed)
Tried to call patient in regards to an appointment request we received electronically. No answer. Left message for patient to give our office a call to schedule an appointment.

## 2020-03-09 ENCOUNTER — Ambulatory Visit (INDEPENDENT_AMBULATORY_CARE_PROVIDER_SITE_OTHER): Payer: PRIVATE HEALTH INSURANCE

## 2020-03-09 ENCOUNTER — Encounter: Payer: Self-pay | Admitting: Orthopaedic Surgery

## 2020-03-09 ENCOUNTER — Other Ambulatory Visit: Payer: Self-pay

## 2020-03-09 ENCOUNTER — Ambulatory Visit: Payer: PRIVATE HEALTH INSURANCE | Admitting: Orthopaedic Surgery

## 2020-03-09 VITALS — Ht 67.0 in | Wt 210.0 lb

## 2020-03-09 DIAGNOSIS — G8929 Other chronic pain: Secondary | ICD-10-CM | POA: Diagnosis not present

## 2020-03-09 DIAGNOSIS — M19071 Primary osteoarthritis, right ankle and foot: Secondary | ICD-10-CM | POA: Insufficient documentation

## 2020-03-09 DIAGNOSIS — M25561 Pain in right knee: Secondary | ICD-10-CM | POA: Diagnosis not present

## 2020-03-09 DIAGNOSIS — M1711 Unilateral primary osteoarthritis, right knee: Secondary | ICD-10-CM

## 2020-03-09 NOTE — Progress Notes (Signed)
Office Visit Note   Patient: Charles Trujillo           Date of Birth: [birth year]           MRN: [id number] Visit Date: 03/09/2020              Requested by: [address], [place], [place] [address] [place],  [place] [address] PCP: Carollee Herter, Alferd Apa, DO   Assessment & Plan: Visit Diagnoses:  1. Chronic pain of right knee   2. Primary osteoarthritis of right knee   3. Arthritis of right ankle     Plan:  #1: At this time no further treatment on the right ankle though may be a candidate for injection in the future. #2: We are going to have him evaluated by his medical doctor to see if he is able to sustain procedure of a right total knee replacement.  Once we receive this information then we will schedule him for a right total knee replacement.  Follow-Up Instructions: Return if symptoms worsen or fail to improve.   Orders:  Orders Placed This Encounter  Procedures  . XR KNEE 3 VIEW RIGHT   No orders of the defined types were placed in this encounter.     Procedures: No procedures performed   Clinical Data: No additional findings.   Subjective: Chief Complaint  Patient presents with  . Right Knee - Pain   HPI Patient presents today for right knee pain. No known injury. He states that it has been hurting for 10years and worsening. He said that the pain is worse with weightbearing, and hurts randomly is resting or lying down. He takes ibuprofen for pain. Minimal swelling. Grinds and pops, but does not give way.   Review of Systems  Constitutional: Negative for fatigue.  HENT: Negative for ear pain.   Eyes: Negative for pain.  Respiratory: Negative for shortness of breath.   Cardiovascular: Negative for leg swelling.  Gastrointestinal: Negative for constipation and diarrhea.  Endocrine: Negative for cold intolerance and heat intolerance.  Genitourinary: Negative for difficulty urinating.  Musculoskeletal: Negative for joint swelling.  Skin:  Negative for rash.  Allergic/Immunologic: Negative for food allergies.  Neurological: Negative for weakness.  Hematological: Does not bruise/bleed easily.  Psychiatric/Behavioral: Negative for sleep disturbance.     Objective: Vital Signs: Ht 5\' 7"  (1.702 m)   Wt 210 lb (95.3 kg)   BMI 32.89 kg/m   Physical Exam Constitutional:      Appearance: Normal appearance. He is well-developed and normal weight.  HENT:     Head: Normocephalic.  Eyes:     Pupils: Pupils are equal, round, and reactive to light.  Pulmonary:     Effort: Pulmonary effort is normal.  Skin:    General: Skin is warm and dry.  Neurological:     Mental Status: He is alert and oriented to person, place, and time.  Psychiatric:        Mood and Affect: Mood normal.        Behavior: Behavior normal.        Thought Content: Thought content normal.        Judgment: Judgment normal.     Ortho Exam  Exam today reveals mild effusion of the right knee.  He does have crepitance with range of motion.  Varus positioning of the knee is also noted.  Pseudolaxity with varus and valgus stressing.     Specialty Comments:  No specialty comments available.  Imaging: No results found.   PMFS History: Patient Active Problem List   Diagnosis Date Noted  . Degenerative arthritis of right knee 03/09/2020  . Arthritis of right ankle 03/09/2020  . Pain in right ankle and joints of right foot 10/13/2019  . Total knee replacement status, left 05/18/2019  . Osteoarthritis of left knee 05/04/2019  . Unilateral primary osteoarthritis, left knee 04/20/2019  . Hyperlipidemia associated with type 2 diabetes mellitus ([place]) 05/19/2018  . Degenerative arthritis of knee, bilateral 06/20/2017  . Left inguinal pain 11/06/2015  . Dysuria 03/27/2015  . Fatigue 03/27/2015  . Benign paroxysmal positional vertigo 04/22/2014  . Dizziness 04/22/2014  . Primary localized osteoarthrosis, lower leg 09/15/2013  . Obesity (BMI 30-39.9)  08/19/2013  . Other and unspecified hyperlipidemia 02/19/2013  . HLD (hyperlipidemia) 03/12/2012  . Tachycardia 03/06/2012  . Inguinal hernia unilateral, non-recurrent 08/02/2011  . Inguinal hernia 06/26/2011  . Preventative health care 02/01/2011  . DM (diabetes mellitus) type II uncontrolled, periph vascular disorder ([place]) 10/20/2009  . CARPAL TUNNEL SYNDROME, BILATERAL 10/03/2009  . ERECTILE DYSFUNCTION, ORGANIC, HX OF 02/06/2009  . DEPRESSION 01/20/2008  . Essential hypertension 01/20/2008  . URI 06/25/2007  . TRANSAMINASES, SERUM, ELEVATED 02/02/2007   Past Medical History:  Diagnosis Date  . Anxiety   . Arthritis    bilateral knees  . Depression   . DM2 (diabetes mellitus, type 2) ([place])   . Dysrhythmia     benign tachycardia, takes [place]  . Elevated LFTs    previously evaluated by Dr Sharlett Iles; h/o "fatty liver"  . GERD (gastroesophageal reflux disease)   . HLD (hyperlipidemia)   . Hypertension   . Inguinal hernia    right  . Squamous cell carcinoma    top of head    Family History  Problem Relation Age of Onset  . Diabetes Mother   . Hypertension Mother   . Heart disease Mother   . Heart failure Mother   . Stroke Father 45  . Parkinsonism Father   . Depression Brother   . Alcohol abuse Brother   . Cancer Maternal Grandmother        bone  . Colon cancer Neg Hx   . Colon polyps Neg Hx   . Esophageal cancer Neg Hx   . Rectal cancer Neg Hx   . Stomach cancer Neg Hx     Past Surgical History:  Procedure Laterality Date  . ACHILLES TENDON REPAIR  1999  . CARPAL TUNNEL RELEASE Right 11/04/2014   Procedure: LIMITED OPEN RIGHT CARPAL TUNNEL RELEASE;  Surgeon: Roseanne Kaufman, MD;  Location: [facility];  Service: Orthopedics;  Laterality: Right;  . CARPAL TUNNEL WITH CUBITAL TUNNEL Left 07/22/2014   Procedure: LEFT CARPAL TUNNEL RELEASE AND CUBITAL TUNNEL RELEASE TRANSPOSITION;  Surgeon: Roseanne Kaufman, MD;  Location: [facility];   Service: Orthopedics;  Laterality: Left;  . COLONOSCOPY    . HERNIA REPAIR  07/2011   left side  . INGUINAL HERNIA REPAIR  08/09/2011   Procedure: LAPAROSCOPIC INGUINAL HERNIA;  Surgeon: Joyice Faster. Cornett, MD;  [facility];  Service: General;  Laterality: Right;  . KNEE ARTHROSCOPY  1993   left knee  . KNEE SURGERY     age 48- right  . SQUAMOUS CELL CARCINOMA EXCISION     left knee  . TOTAL KNEE ARTHROPLASTY Left 05/04/2019   Procedure: LEFT TOTAL KNEE ARTHROPLASTY;  Surgeon: Garald Balding, MD;  [facility];  Service: Orthopedics;  Laterality: Left;  Social History   Occupational History  . 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 Smoking status: Current Some Day Smoker    Types: Cigars  . Smokeless tobacco: Never Used  . Tobacco comment: smokes one every other day  Vaping Use  . Vaping Use: Never used  Substance and Sexual Activity  . Alcohol use: Yes    Alcohol/week: 3.0 - 4.0 standard drinks    Types: 3 - 4 Cans of beer per week    Comment: social  . Drug use: No  . Sexual activity: Yes    Partners: Female

## 2020-03-14 ENCOUNTER — Other Ambulatory Visit: Payer: Self-pay

## 2020-03-14 ENCOUNTER — Encounter: Payer: Self-pay | Admitting: Family Medicine

## 2020-03-14 ENCOUNTER — Ambulatory Visit: Payer: PRIVATE HEALTH INSURANCE | Admitting: Family Medicine

## 2020-03-14 VITALS — BP 110/80 | HR 81 | Temp 98.2°F | Resp 18 | Ht 67.0 in | Wt 208.2 lb

## 2020-03-14 DIAGNOSIS — I10 Essential (primary) hypertension: Secondary | ICD-10-CM

## 2020-03-14 DIAGNOSIS — Z23 Encounter for immunization: Secondary | ICD-10-CM | POA: Diagnosis not present

## 2020-03-14 DIAGNOSIS — E1165 Type 2 diabetes mellitus with hyperglycemia: Secondary | ICD-10-CM | POA: Diagnosis not present

## 2020-03-14 DIAGNOSIS — E785 Hyperlipidemia, unspecified: Secondary | ICD-10-CM

## 2020-03-14 DIAGNOSIS — E1169 Type 2 diabetes mellitus with other specified complication: Secondary | ICD-10-CM

## 2020-03-14 LAB — MICROALBUMIN / CREATININE URINE RATIO
Creatinine, Urine: 142 mg/dL (ref 20–320)
Microalb Creat Ratio: 44 mcg/mg creat — ABNORMAL HIGH (ref <30)
Microalb, Ur: 6.2 mg/dL

## 2020-03-14 MED ORDER — ATORVASTATIN CALCIUM 20 MG PO TABS: 20.0000 mg | ORAL_TABLET | Freq: Every day | ORAL | 1 refills | Status: DC

## 2020-03-14 NOTE — Assessment & Plan Note (Signed)
Well controlled, no changes to meds. Encouraged heart healthy diet such as the DASH diet and exercise as tolerated.  °

## 2020-03-14 NOTE — Assessment & Plan Note (Signed)
Tolerating statin, encouraged heart healthy diet, avoid trans fats, minimize simple carbs and saturated fats. Increase exercise as tolerated 

## 2020-03-14 NOTE — Assessment & Plan Note (Signed)
hgba1c to be checked, minimize simple carbs. Increase exercise as tolerated. Continue current meds  

## 2020-03-14 NOTE — Patient Instructions (Signed)
Carbohydrate Counting for Diabetes Mellitus, Adult  Carbohydrate counting is a method of keeping track of how many carbohydrates you eat. Eating carbohydrates naturally increases the amount of sugar (glucose) in the blood. Counting how many carbohydrates you eat helps keep your blood glucose within normal limits, which helps you manage your diabetes (diabetes mellitus). It is important to know how many carbohydrates you can safely have in each meal. This is different for every person. A diet and nutrition specialist (registered dietitian) can help you make a meal plan and calculate how many carbohydrates you should have at each meal and snack. Carbohydrates are found in the following foods:  Grains, such as breads and cereals.  Dried beans and soy products.  Starchy vegetables, such as potatoes, peas, and corn.  Fruit and fruit juices.  Milk and yogurt.  Sweets and snack foods, such as cake, cookies, candy, chips, and soft drinks. How do I count carbohydrates? There are two ways to count carbohydrates in food. You can use either of the methods or a combination of both. Reading "Nutrition Facts" on packaged food The "Nutrition Facts" list is included on the labels of almost all packaged foods and beverages in the [place] It includes:  The serving size.  Information about nutrients in each serving, including the grams (g) of carbohydrate per serving. To use the "Nutrition Facts":  Decide how many servings you will have.  Multiply the number of servings by the number of carbohydrates per serving.  The resulting number is the total amount of carbohydrates that you will be having. Learning standard serving sizes of other foods When you eat carbohydrate foods that are not packaged or do not include "Nutrition Facts" on the label, you need to measure the servings in order to count the amount of carbohydrates:  Measure the foods that you will eat with a food scale or measuring cup, if  needed.  Decide how many standard-size servings you will eat.  Multiply the number of servings by 15. Most carbohydrate-rich foods have about 15 g of carbohydrates per serving. ? For example, if you eat 8 oz (170 g) of strawberries, you will have eaten 2 servings and 30 g of carbohydrates (2 servings x 15 g = 30 g).  For foods that have more than one food mixed, such as soups and casseroles, you must count the carbohydrates in each food that is included. The following list contains standard serving sizes of common carbohydrate-rich foods. Each of these servings has about 15 g of carbohydrates:   hamburger bun or  English muffin.   oz (15 mL) syrup.   oz (14 g) jelly.  1 slice of bread.  1 six-inch tortilla.  3 oz (85 g) cooked rice or pasta.  4 oz (113 g) cooked dried beans.  4 oz (113 g) starchy vegetable, such as peas, corn, or potatoes.  4 oz (113 g) hot cereal.  4 oz (113 g) mashed potatoes or  of a large baked potato.  4 oz (113 g) canned or frozen fruit.  4 oz (120 mL) fruit juice.  4-6 crackers.  6 chicken nuggets.  6 oz (170 g) unsweetened dry cereal.  6 oz (170 g) plain fat-free yogurt or yogurt sweetened with artificial sweeteners.  8 oz (240 mL) milk.  8 oz (170 g) fresh fruit or one small piece of fruit.  24 oz (680 g) popped popcorn. Example of carbohydrate counting Sample meal  3 oz (85 g) chicken breast.  6 oz (170 g)   brown rice.  4 oz (113 g) corn.  8 oz (240 mL) milk.  8 oz (170 g) strawberries with sugar-free whipped topping. Carbohydrate calculation 1. Identify the foods that contain carbohydrates: ? Rice. ? Corn. ? Milk. ? Strawberries. 2. Calculate how many servings you have of each food: ? 2 servings rice. ? 1 serving corn. ? 1 serving milk. ? 1 serving strawberries. 3. Multiply each number of servings by 15 g: ? 2 servings rice x 15 g = 30 g. ? 1 serving corn x 15 g = 15 g. ? 1 serving milk x 15 g = 15 g. ? 1  serving strawberries x 15 g = 15 g. 4. Add together all of the amounts to find the total grams of carbohydrates eaten: ? 30 g + 15 g + 15 g + 15 g = 75 g of carbohydrates total. Summary  Carbohydrate counting is a method of keeping track of how many carbohydrates you eat.  Eating carbohydrates naturally increases the amount of sugar (glucose) in the blood.  Counting how many carbohydrates you eat helps keep your blood glucose within normal limits, which helps you manage your diabetes.  A diet and nutrition specialist (registered dietitian) can help you make a meal plan and calculate how many carbohydrates you should have at each meal and snack. This information is not intended to replace advice given to you by your health care provider. Make sure you discuss any questions you have with your health care provider. Document Revised: 01/23/2017 Document Reviewed: 12/13/2015 Elsevier Patient Education  2020 Elsevier Inc.  

## 2020-03-14 NOTE — Progress Notes (Signed)
Patient ID: Charles Trujillo, male    DOB: Sep 15, 1950  Age: 69 y.o. MRN: [id number]    Subjective:  Subjective  HPI Charles Trujillo presents for f/u bp and cholesterol and diabetes   HYPERTENSION   Blood pressure range-not checking   Chest pain- no      Dyspnea- no Lightheadedness- no   Edema- no  Other side effects - no   Medication compliance: good  Low salt diet- yes     DIABETES    Blood Sugar ranges- 139 --- today   Polyuria- no New Visual problems- no  Hypoglycemic symptoms- no  Other side effects-no Medication compliance - good Last eye exam- in the last 6 months  Foot exam- today   HYPERLIPIDEMIA  Medication compliance- good  RUQ pain- no  Muscle aches- no Other side effects-no      Review of Systems  Constitutional: Negative for appetite change, diaphoresis, fatigue and unexpected weight change.  Eyes: Negative for pain, redness and visual disturbance.  Respiratory: Negative for cough, chest tightness, shortness of breath and wheezing.   Cardiovascular: Negative for chest pain, palpitations and leg swelling.  Endocrine: Negative for cold intolerance, heat intolerance, polydipsia, polyphagia and polyuria.  Genitourinary: Negative for difficulty urinating, dysuria and frequency.  Neurological: Negative for dizziness, light-headedness, numbness and headaches.    History Past Medical History:  Diagnosis Date  . Anxiety   . Arthritis    bilateral knees  . Depression   . DM2 (diabetes mellitus, type 2) ([place])   . Dysrhythmia     benign tachycardia, takes [place]  . Elevated LFTs    previously evaluated by Dr Sharlett Iles; h/o "fatty liver"  . GERD (gastroesophageal reflux disease)   . HLD (hyperlipidemia)   . Hypertension   . Inguinal hernia    right  . Squamous cell carcinoma    top of head    He has a past surgical history that includes Knee surgery; Achilles tendon repair (1999); Squamous cell carcinoma excision; Knee arthroscopy (1993);  Inguinal hernia repair (08/09/2011); Hernia repair (07/2011); Carpal tunnel with cubital tunnel (Left, 07/22/2014); Carpal tunnel release (Right, 11/04/2014); Colonoscopy; and Total knee arthroplasty (Left, 05/04/2019).   His family history includes Alcohol abuse in his brother; Cancer in his maternal grandmother; Depression in his brother; Diabetes in his mother; Heart disease in his mother; Heart failure in his mother; Hypertension in his mother; Parkinsonism in his father; Stroke (age of onset: 97) in his father.He reports that he has been smoking cigars. He has never used smokeless tobacco. He reports current alcohol use of about 3.0 - 4.0 standard drinks of alcohol per week. He reports that he does not use drugs.  Current Outpatient Medications on File Prior to Visit  Medication Sig Dispense Refill  . amoxicillin (AMOXIL) 500 MG tablet Take 4 tablets by mouth one hour prior to any dental procedures. 12 tablet 0  . diltiazem (CARDIZEM CD) 120 MG 24 hr capsule TAKE 1 CAPSULE BY MOUTH DAILY 90 capsule 1  . glucose blood (ONE TOUCH TEST STRIPS) test strip Check blood sugar twice daily 100 each 11  . JANUMET XR [phone] MG TB24 TAKE 1 TABLET BY MOUTH DAILY. 90 tablet 1  . lisinopril (ZESTRIL) 10 MG tablet TAKE 1 TABLET BY MOUTH DAILY. 90 tablet 3  . metoprolol (LOPRESSOR) 50 MG tablet Take 1 tablet by mouth 2 (two) times daily.    Marland Kitchen nystatin-triamcinolone ointment (MYCOLOG) Apply 1 application topically 2 (two) times daily. 30 g 1  .  ONETOUCH DELICA LANCETS FINE MISC Use qd 100 each 3   No current facility-administered medications on file prior to visit.     Objective:  Objective  Physical Exam Vitals and nursing note reviewed.  Constitutional:      General: He is sleeping.     Appearance: He is well-developed.  HENT:     Head: Normocephalic and atraumatic.  Eyes:     Pupils: Pupils are equal, round, and reactive to light.  Neck:     Thyroid: No thyromegaly.  Cardiovascular:     Rate and  Rhythm: Normal rate and regular rhythm.     Heart sounds: No murmur heard.   Pulmonary:     Effort: Pulmonary effort is normal. No respiratory distress.     Breath sounds: Normal breath sounds. No wheezing or rales.  Chest:     Chest wall: No tenderness.  Musculoskeletal:        General: No tenderness.     Cervical back: Normal range of motion and neck supple.  Skin:    General: Skin is warm and dry.  Neurological:     Mental Status: He is oriented to person, place, and time.  Psychiatric:        Behavior: Behavior normal.        Thought Content: Thought content normal.        Judgment: Judgment normal.    Diabetic Foot Exam - Simple   Simple Foot Form Diabetic Foot exam was performed with the following findings: Yes 03/14/2020  9:07 AM  Visual Inspection No deformities, no ulcerations, no other skin breakdown bilaterally: Yes Sensation Testing Intact to touch and monofilament testing bilaterally: Yes Pulse Check Posterior Tibialis and Dorsalis pulse intact bilaterally: Yes Comments      BP 110/80 (BP Location: Right Arm, Patient Position: Sitting, Cuff Size: Large)   Pulse 81   Temp 98.2 F (36.8 C) (Oral)   Resp 18   Ht 5\' 7"  (1.702 m)   Wt 208 lb 3.2 oz (94.4 kg)   SpO2 95%   BMI 32.61 kg/m  Wt Readings from Last 3 Encounters:  03/14/20 208 lb 3.2 oz (94.4 kg)  03/09/20 210 lb (95.3 kg)  11/17/19 207 lb (93.9 kg)     Lab Results  Component Value Date   WBC 14.2 (H) 05/05/2019   HGB 12.3 (L) 05/05/2019   HCT 37.6 (L) 05/05/2019   PLT 221 05/05/2019   GLUCOSE 124 (H) 09/27/2019   CHOL 126 09/27/2019   TRIG 77.0 09/27/2019   HDL 48.70 09/27/2019   LDLDIRECT 143.5 02/01/2011   LDLCALC 62 09/27/2019   ALT 37 09/27/2019   AST 36 09/27/2019   NA 135 09/27/2019   K 4.4 09/27/2019   CL 102 09/27/2019   CREATININE 1.09 09/27/2019   BUN 17 09/27/2019   CO2 27 09/27/2019   TSH 5.50 (H) 12/09/2017   PSA 0.33 04/03/2012   INR 1.1 04/30/2019   HGBA1C 6.8  (H) 09/27/2019   MICROALBUR 1.0 11/01/2014    DG Chest 2 View  Result Date: 04/30/2019 CLINICAL DATA:  Preop exam for knee replacement. No chest complaints. EXAM: CHEST - 2 VIEW COMPARISON:  08/05/2011. FINDINGS: The heart size and mediastinal contours are within normal limits. Both lungs are clear. No pleural effusion or pneumothorax. The visualized skeletal structures are unremarkable. IMPRESSION: No active cardiopulmonary disease. Electronically Signed   By: Lajean Manes M.D.   On: 04/30/2019 13:56     Assessment & Plan:  Plan  I have changed Charles Skeens. Mckiernan "BILL"'s atorvastatin. I am also having him maintain his metoprolol tartrate, glucose blood, OneTouch Delica Lancets Fine, lisinopril, diltiazem, Janumet XR, amoxicillin, and nystatin-triamcinolone ointment.  Meds ordered this encounter  Medications  . atorvastatin (LIPITOR) 20 MG tablet    Sig: Take 1 tablet (20 mg total) by mouth daily.    Dispense:  90 tablet    Refill:  1    Problem List Items Addressed This Visit      Unprioritized   Essential hypertension    Well controlled, no changes to meds. Encouraged heart healthy diet such as the DASH diet and exercise as tolerated.       Relevant Medications   atorvastatin (LIPITOR) 20 MG tablet   Other Relevant Orders   Lipid panel   Hemoglobin A1c   Comprehensive metabolic panel   Hyperlipidemia associated with type 2 diabetes mellitus ([place]) - Primary    Tolerating statin, encouraged heart healthy diet, avoid trans fats, minimize simple carbs and saturated fats. Increase exercise as tolerated      Relevant Medications   atorvastatin (LIPITOR) 20 MG tablet   Other Relevant Orders   Lipid panel   Comprehensive metabolic panel   Uncontrolled type 2 diabetes mellitus with hyperglycemia ([place])    hgba1c to be checked , minimize simple carbs. Increase exercise as tolerated. Continue current meds       Relevant Medications   atorvastatin (LIPITOR) 20 MG tablet    Other Relevant Orders   Hemoglobin A1c   Comprehensive metabolic panel   Microalbumin / creatinine urine ratio    Other Visit Diagnoses    Need for Tdap vaccination       Relevant Orders   Tdap vaccine greater than or equal to 7yo IM (Completed)      Follow-up: Return in about 6 months (around 09/11/2020), or if symptoms worsen or fail to improve, for hypertension, hyperlipidemia, diabetes II, annual exam, fasting.  [name], DO

## 2020-03-15 LAB — COMPREHENSIVE METABOLIC PANEL
AG Ratio: 1.7 (calc) (ref 1.0–2.5)
ALT: 49 U/L — ABNORMAL HIGH (ref 9–46)
AST: 48 U/L — ABNORMAL HIGH (ref 10–35)
Albumin: 4.2 g/dL (ref 3.6–5.1)
Alkaline phosphatase (APISO): 45 U/L (ref 35–144)
BUN: 15 mg/dL (ref 7–25)
CO2: 28 mmol/L (ref 20–32)
Calcium: 9.6 mg/dL (ref 8.6–10.3)
Chloride: 104 mmol/L (ref 98–110)
Creat: 1.1 mg/dL (ref 0.70–1.18)
Globulin: 2.5 g/dL (calc) (ref 1.9–3.7)
Glucose, Bld: 134 mg/dL — ABNORMAL HIGH (ref 65–99)
Potassium: 4.6 mmol/L (ref 3.5–5.3)
Sodium: 138 mmol/L (ref 135–146)
Total Bilirubin: 0.7 mg/dL (ref 0.2–1.2)
Total Protein: 6.7 g/dL (ref 6.1–8.1)

## 2020-03-15 LAB — LIPID PANEL
Cholesterol: 144 mg/dL (ref <200)
HDL: 57 mg/dL (ref >=40)
LDL Cholesterol (Calc): 73 mg/dL (calc)
Non-HDL Cholesterol (Calc): 87 mg/dL (calc) (ref <130)
Total CHOL/HDL Ratio: 2.5 (calc) (ref <5.0)
Triglycerides: 59 mg/dL (ref <150)

## 2020-03-15 LAB — HEMOGLOBIN A1C
Hgb A1c MFr Bld: 6.5 % of total Hgb — ABNORMAL HIGH (ref <5.7)
Mean Plasma Glucose: 140 (calc)
eAG (mmol/L): 7.7 (calc)

## 2020-03-29 ENCOUNTER — Encounter: Payer: Self-pay | Admitting: Family Medicine

## 2020-03-29 DIAGNOSIS — I10 Essential (primary) hypertension: Secondary | ICD-10-CM

## 2020-03-29 MED ORDER — DILTIAZEM HCL ER COATED BEADS 120 MG PO CP24: 120.0000 mg | ORAL_CAPSULE | Freq: Every day | ORAL | 1 refills | Status: DC

## 2020-04-06 ENCOUNTER — Other Ambulatory Visit: Payer: Self-pay | Admitting: Orthopaedic Surgery

## 2020-04-06 MED ORDER — AMOXICILLIN 500 MG PO TABS: ORAL_TABLET | ORAL | 0 refills | Status: DC

## 2020-04-17 ENCOUNTER — Encounter: Payer: Self-pay | Admitting: Family Medicine

## 2020-04-18 NOTE — Telephone Encounter (Signed)
He has a cardiologist so ekg would be done by them  I will fill it out if you get me the form

## 2020-04-24 ENCOUNTER — Encounter: Payer: Self-pay | Admitting: *Deleted

## 2020-04-25 ENCOUNTER — Other Ambulatory Visit: Payer: Self-pay

## 2020-04-27 ENCOUNTER — Encounter: Payer: Self-pay | Admitting: Orthopaedic Surgery

## 2020-04-27 ENCOUNTER — Other Ambulatory Visit: Payer: Self-pay

## 2020-04-27 ENCOUNTER — Ambulatory Visit (INDEPENDENT_AMBULATORY_CARE_PROVIDER_SITE_OTHER): Payer: PRIVATE HEALTH INSURANCE | Admitting: Orthopaedic Surgery

## 2020-04-27 VITALS — BP 130/67 | HR 76 | Ht 68.0 in | Wt 209.0 lb

## 2020-04-27 DIAGNOSIS — M1711 Unilateral primary osteoarthritis, right knee: Secondary | ICD-10-CM | POA: Diagnosis not present

## 2020-04-27 NOTE — Progress Notes (Addendum)
Office Visit Note   Patient: Charles Trujillo           Date of Birth: 04-02-1951           MRN: [id number] Visit Date: 04/27/2020              Requested by: [address] [place], [place] [address] [place],  [place] [address] PCP: Carollee Herter, Alferd Apa, DO   Chief Complaint:right knee pain.  HPI: Charles Trujillo, 69 y.o. male, has a history of pain and functional disability in the right knee due to arthritis and has failed non-surgical conservative treatments for greater than 12 weeks to includeNSAID's and/or analgesics, corticosteriod injections, viscosupplementation injections, flexibility and strengthening excercises, weight reduction as appropriate and activity modification.  Onset of symptoms was gradual, starting 10 years ago with gradually worsening course since that time. The patient noted prior procedures on the knee to include  menisectomy on the right knee(s).  Patient currently rates pain in the right knee(s) at 6 out of 10 with activity. Patient has night pain, worsening of pain with activity and weight bearing, pain that interferes with activities of daily living, pain with passive range of motion, crepitus and joint swelling.  Patient has evidence of subchondral cysts, subchondral sclerosis, periarticular osteophytes, joint subluxation and joint space narrowing by imaging studies.  There is no active infection.  Patient Active Problem List   Diagnosis Date Noted  . Uncontrolled type 2 diabetes mellitus with hyperglycemia ([facility]) 03/14/2020  . Degenerative arthritis of right knee 03/09/2020  . Arthritis of right ankle 03/09/2020  . Pain in right ankle and joints of right foot 10/13/2019  . Total knee replacement status, left 05/18/2019  . Osteoarthritis of left knee 05/04/2019  . Unilateral primary osteoarthritis, left knee 04/20/2019  . Hyperlipidemia associated with type 2 diabetes mellitus ([place]) 05/19/2018  . Degenerative arthritis of knee, bilateral  06/20/2017  . Left inguinal pain 11/06/2015  . Dysuria 03/27/2015  . Fatigue 03/27/2015  . Benign paroxysmal positional vertigo 04/22/2014  . Dizziness 04/22/2014  . Primary localized osteoarthrosis, lower leg 09/15/2013  . Obesity (BMI 30-39.9) 08/19/2013  . Other and unspecified hyperlipidemia 02/19/2013  . HLD (hyperlipidemia) 03/12/2012  . Tachycardia 03/06/2012  . Inguinal hernia unilateral, non-recurrent 08/02/2011  . Inguinal hernia 06/26/2011  . Preventative health care 02/01/2011  . DM (diabetes mellitus) type II uncontrolled, periph vascular disorder ([facility]) 10/20/2009  . CARPAL TUNNEL SYNDROME, BILATERAL 10/03/2009  . ERECTILE DYSFUNCTION, ORGANIC, HX OF 02/06/2009  . DEPRESSION 01/20/2008  . Essential hypertension 01/20/2008  . URI 06/25/2007  . TRANSAMINASES, SERUM, ELEVATED 02/02/2007   Past Medical History:  Diagnosis Date  . Anxiety   . Arthritis    bilateral knees  . Depression   . DM2 (diabetes mellitus, type 2) ([facility])   . Dysrhythmia     benign tachycardia, takes [place]  . Elevated LFTs    previously evaluated by Dr Sharlett Iles; h/o "fatty liver"  . GERD (gastroesophageal reflux disease)   . HLD (hyperlipidemia)   . Hypertension   . Inguinal hernia    right  . Squamous cell carcinoma    top of head    Past Surgical History:  Procedure Laterality Date  . ACHILLES TENDON REPAIR  1999  . CARPAL TUNNEL RELEASE Right 11/04/2014   Procedure: LIMITED OPEN RIGHT CARPAL TUNNEL RELEASE;  Surgeon: Roseanne Kaufman, MD;  Location: [facility];  Service: Orthopedics;  Laterality: Right;  . CARPAL TUNNEL WITH  CUBITAL TUNNEL Left 07/22/2014   Procedure: LEFT CARPAL TUNNEL RELEASE AND CUBITAL TUNNEL RELEASE TRANSPOSITION;  Surgeon: Roseanne Kaufman, MD;  Location: [facility];  Service: Orthopedics;  Laterality: Left;  . COLONOSCOPY    . HERNIA REPAIR  07/2011   left side  . INGUINAL HERNIA REPAIR  08/09/2011   Procedure: LAPAROSCOPIC INGUINAL  HERNIA;  Surgeon: Joyice Faster. Cornett, MD;  Location: [facility];  Service: General;  Laterality: Right;  . KNEE ARTHROSCOPY  1993   left knee  . KNEE SURGERY     age 42- right  . SQUAMOUS CELL CARCINOMA EXCISION     left knee  . TOTAL KNEE ARTHROPLASTY Left 05/04/2019   Procedure: LEFT TOTAL KNEE ARTHROPLASTY;  Surgeon: Garald Balding, MD;  Location: [facility];  Service: Orthopedics;  Laterality: Left;    No current facility-administered medications for this encounter.   Current Outpatient Medications  Medication Sig Dispense Refill Last Dose  . amoxicillin (AMOXIL) 500 MG tablet Take 4 tablets by mouth one hour prior to any dental procedures. 12 tablet 0   . atorvastatin (LIPITOR) 20 MG tablet Take 1 tablet (20 mg total) by mouth daily. 90 tablet 1   . diltiazem (CARDIZEM CD) 120 MG 24 hr capsule Take 1 capsule (120 mg total) by mouth daily. 90 capsule 1   . glucose blood (ONE TOUCH TEST STRIPS) test strip Check blood sugar twice daily 100 each 11   . JANUMET XR [phone] MG TB24 TAKE 1 TABLET BY MOUTH DAILY. 90 tablet 1   . lisinopril (ZESTRIL) 10 MG tablet TAKE 1 TABLET BY MOUTH DAILY. 90 tablet 3   . metoprolol (LOPRESSOR) 50 MG tablet Take 1 tablet by mouth 2 (two) times daily.     Marland Kitchen nystatin-triamcinolone ointment (MYCOLOG) Apply 1 application topically 2 (two) times daily. 30 g 1   . ONETOUCH DELICA LANCETS FINE MISC Use qd 100 each 3    Allergies  Allergen Reactions  . Codeine Nausea Only  . Codeine Phosphate     REACTION: nausea    Social History   Tobacco Use  . Smoking status: Current Some Day Smoker    Types: Cigars  . Smokeless tobacco: Never Used  . Tobacco comment: smokes one every other day  Substance Use Topics  . Alcohol use: Yes    Alcohol/week: 3.0 - 4.0 standard drinks    Types: 3 - 4 Cans of beer per week    Comment: social    Family History  Problem Relation Age of Onset  . Diabetes Mother   . Hypertension Mother   . Heart disease Mother   . Heart  failure Mother   . Stroke Father 47  . Parkinsonism Father   . Depression Brother   . Alcohol abuse Brother   . Cancer Maternal Grandmother        bone  . Colon cancer Neg Hx   . Colon polyps Neg Hx   . Esophageal cancer Neg Hx   . Rectal cancer Neg Hx   . Stomach cancer Neg Hx      Review of Systems  Constitutional: Negative for fatigue.  HENT: Negative for ear pain.   Eyes: Negative for pain.  Respiratory: Negative for shortness of breath.   Cardiovascular: Negative for leg swelling.  Gastrointestinal: Negative for constipation and diarrhea.  Endocrine: Negative for cold intolerance and heat intolerance.  Genitourinary: Negative for difficulty urinating.  Musculoskeletal: Negative for joint swelling.  Skin: Negative for rash.  Allergic/Immunologic:  Negative for food allergies.  Neurological: Negative for weakness.  Hematological: Does not bruise/bleed easily.  Psychiatric/Behavioral: Negative for sleep disturbance.    Objective:  Physical Exam Constitutional:      Appearance: Normal appearance. He is normal weight.  HENT:     Head: Normocephalic.     Nose: Nose normal.     Mouth/Throat:     Mouth: Mucous membranes are dry.  Cardiovascular:     Rate and Rhythm: Normal rate and regular rhythm.     Pulses: Normal pulses.     Heart sounds: Normal heart sounds.  Pulmonary:     Effort: Pulmonary effort is normal.     Breath sounds: Normal breath sounds.  Abdominal:     General: Abdomen is flat.     Palpations: Abdomen is soft.  Skin:    General: Skin is warm and dry.  Neurological:     Mental Status: He is alert.  Psychiatric:        Mood and Affect: Mood normal.        Behavior: Behavior normal.        Thought Content: Thought content normal.        Judgment: Judgment normal.   Ortho exam: ROM 2- 100 degrees.  He does have crepitance with range of motion.  Varus positioning of the knee is also noted.  Pseudolaxity with varus and valgus stressing.Mild  effusion without warmth or erythema   Vital signs in last 24 hours: Pulse Rate:  [76] 76 (10/14 1515) BP: (130)/(67) 130/67 (10/14 1515) Weight:  [94.8 kg] 94.8 kg (10/14 1515)  Labs:   Estimated body mass index is 31.78 kg/m as calculated from the following:   Height as of 04/27/20: 5\' 8"  (1.727 m).   Weight as of 04/27/20: 94.8 kg.   Imaging Review Plain radiographs demonstrate moderate degenerative joint disease of the right knee(s). The overall alignment ismild varus. The bone quality appears to be good for age and reported activity level.      Assessment/Plan:  End stage arthritis, right knee   The patient history, physical examination, clinical judgment of the provider and imaging studies are consistent with end stage degenerative joint disease of the right knee(s) and total knee arthroplasty is deemed medically necessary. The treatment options including medical management, injection therapy arthroscopy and arthroplasty were discussed at length. The risks and benefits of total knee arthroplasty were presented and reviewed. The risks due to aseptic loosening, infection, stiffness, patella tracking problems, thromboembolic complications and other imponderables were discussed. The patient acknowledged the explanation, agreed to proceed with the plan and consent was signed. Patient is being admitted for inpatient treatment for surgery, pain control, PT, OT, prophylactic antibiotics, VTE prophylaxis, progressive ambulation and ADL's and discharge planning. The patient is planning to be discharged home with home health services    Patient's anticipated LOS is less than 2 midnights, meeting these requirements: - Younger than 93 - Lives within 1 hour of care - Has a competent adult at home to recover with post-op recover - NO history of  - Chronic pain requiring opiods  - Diabetes  - Coronary Artery Disease  - Heart failure  - Heart attack  - Stroke  - DVT/VTE  - Cardiac  arrhythmia  - Respiratory Failure/COPD  - Renal failure  - Anemia  - Advanced Liver disease   Mike Craze. Micheal Likens [phone]  04/27/2020 4:12 PM

## 2020-04-27 NOTE — H&P (Addendum)
TOTAL KNEE ADMISSION H&P  Patient is being admitted for right total knee arthroplasty.  Subjective:  Chief Complaint:right knee pain.  HPI: Charles Trujillo, 69 y.o. male, has a history of pain and functional disability in the right knee due to arthritis and has failed non-surgical conservative treatments for greater than 12 weeks to includeNSAID's and/or analgesics, corticosteriod injections, viscosupplementation injections, flexibility and strengthening excercises, weight reduction as appropriate and activity modification.  Onset of symptoms was gradual, starting 10 years ago with gradually worsening course since that time. The patient noted prior procedures on the knee to include  menisectomy on the right knee(s).  Patient currently rates pain in the right knee(s) at 6 out of 10 with activity. Patient has night pain, worsening of pain with activity and weight bearing, pain that interferes with activities of daily living, pain with passive range of motion, crepitus and joint swelling.  Patient has evidence of subchondral cysts, subchondral sclerosis, periarticular osteophytes, joint subluxation and joint space narrowing by imaging studies.  There is no active infection.  Patient Active Problem List   Diagnosis Date Noted  . Uncontrolled type 2 diabetes mellitus with hyperglycemia ([facility]) 03/14/2020  . Degenerative arthritis of right knee 03/09/2020  . Arthritis of right ankle 03/09/2020  . Pain in right ankle and joints of right foot 10/13/2019  . Total knee replacement status, left 05/18/2019  . Osteoarthritis of left knee 05/04/2019  . Unilateral primary osteoarthritis, left knee 04/20/2019  . Hyperlipidemia associated with type 2 diabetes mellitus ([facility]) 05/19/2018  . Degenerative arthritis of knee, bilateral 06/20/2017  . Left inguinal pain 11/06/2015  . Dysuria 03/27/2015  . Fatigue 03/27/2015  . Benign paroxysmal positional vertigo 04/22/2014  . Dizziness 04/22/2014  . Primary  localized osteoarthrosis, lower leg 09/15/2013  . Obesity (BMI 30-39.9) 08/19/2013  . Other and unspecified hyperlipidemia 02/19/2013  . HLD (hyperlipidemia) 03/12/2012  . Tachycardia 03/06/2012  . Inguinal hernia unilateral, non-recurrent 08/02/2011  . Inguinal hernia 06/26/2011  . Preventative health care 02/01/2011  . DM (diabetes mellitus) type II uncontrolled, periph vascular disorder ([facility]) 10/20/2009  . CARPAL TUNNEL SYNDROME, BILATERAL 10/03/2009  . ERECTILE DYSFUNCTION, ORGANIC, HX OF 02/06/2009  . DEPRESSION 01/20/2008  . Essential hypertension 01/20/2008  . URI 06/25/2007  . TRANSAMINASES, SERUM, ELEVATED 02/02/2007   Past Medical History:  Diagnosis Date  . Anxiety   . Arthritis    bilateral knees  . Depression   . DM2 (diabetes mellitus, type 2) ([facility])   . Dysrhythmia     benign tachycardia, takes [place]  . Elevated LFTs    previously evaluated by Dr Sharlett Iles; h/o "fatty liver"  . GERD (gastroesophageal reflux disease)   . HLD (hyperlipidemia)   . Hypertension   . Inguinal hernia    right  . Squamous cell carcinoma    top of head    Past Surgical History:  Procedure Laterality Date  . ACHILLES TENDON REPAIR  1999  . CARPAL TUNNEL RELEASE Right 11/04/2014   Procedure: LIMITED OPEN RIGHT CARPAL TUNNEL RELEASE;  Surgeon: Roseanne Kaufman, MD;  Location: [facility];  Service: Orthopedics;  Laterality: Right;  . CARPAL TUNNEL WITH CUBITAL TUNNEL Left 07/22/2014   Procedure: LEFT CARPAL TUNNEL RELEASE AND CUBITAL TUNNEL RELEASE TRANSPOSITION;  Surgeon: Roseanne Kaufman, MD;  Location: [facility];  Service: Orthopedics;  Laterality: Left;  . COLONOSCOPY    . HERNIA REPAIR  07/2011   left side  . INGUINAL HERNIA REPAIR  08/09/2011   Procedure: LAPAROSCOPIC INGUINAL HERNIA;  Surgeon: Joyice Faster. Cornett, MD;  Location: [facility];  Service: General;  Laterality: Right;  . KNEE ARTHROSCOPY  1993   left knee  . KNEE SURGERY     age 21- right  .  SQUAMOUS CELL CARCINOMA EXCISION     left knee  . TOTAL KNEE ARTHROPLASTY Left 05/04/2019   Procedure: LEFT TOTAL KNEE ARTHROPLASTY;  Surgeon: Garald Balding, MD;  Location[facility];  Service: Orthopedics;  Laterality: Left;    No current facility-administered medications for this encounter.   Current Outpatient Medications  Medication Sig Dispense Refill Last Dose  . amoxicillin (AMOXIL) 500 MG tablet Take 4 tablets by mouth one hour prior to any dental procedures. 12 tablet 0   . atorvastatin (LIPITOR) 20 MG tablet Take 1 tablet (20 mg total) by mouth daily. 90 tablet 1   . diltiazem (CARDIZEM CD) 120 MG 24 hr capsule Take 1 capsule (120 mg total) by mouth daily. 90 capsule 1   . glucose blood (ONE TOUCH TEST STRIPS) test strip Check blood sugar twice daily 100 each 11   . JANUMET XR [phone] MG TB24 TAKE 1 TABLET BY MOUTH DAILY. 90 tablet 1   . lisinopril (ZESTRIL) 10 MG tablet TAKE 1 TABLET BY MOUTH DAILY. 90 tablet 3   . metoprolol (LOPRESSOR) 50 MG tablet Take 1 tablet by mouth 2 (two) times daily.     Marland Kitchen nystatin-triamcinolone ointment (MYCOLOG) Apply 1 application topically 2 (two) times daily. 30 g 1   . ONETOUCH DELICA LANCETS FINE MISC Use qd 100 each 3    Allergies  Allergen Reactions  . Codeine Nausea Only  . Codeine Phosphate     REACTION: nausea    Social History   Tobacco Use  . Smoking status: Current Some Day Smoker    Types: Cigars  . Smokeless tobacco: Never Used  . Tobacco comment: smokes one every other day  Substance Use Topics  . Alcohol use: Yes    Alcohol/week: 3.0 - 4.0 standard drinks    Types: 3 - 4 Cans of beer per week    Comment: social    Family History  Problem Relation Age of Onset  . Diabetes Mother   . Hypertension Mother   . Heart disease Mother   . Heart failure Mother   . Stroke Father 30  . Parkinsonism Father   . Depression Brother   . Alcohol abuse Brother   . Cancer Maternal Grandmother        bone  . Colon cancer Neg Hx    . Colon polyps Neg Hx   . Esophageal cancer Neg Hx   . Rectal cancer Neg Hx   . Stomach cancer Neg Hx      Review of Systems  Constitutional: Negative for fatigue.  HENT: Negative for ear pain.   Eyes: Negative for pain.  Respiratory: Negative for shortness of breath.   Cardiovascular: Negative for leg swelling.  Gastrointestinal: Negative for constipation and diarrhea.  Endocrine: Negative for cold intolerance and heat intolerance.  Genitourinary: Negative for difficulty urinating.  Musculoskeletal: Negative for joint swelling.  Skin: Negative for rash.  Allergic/Immunologic: Negative for food allergies.  Neurological: Negative for weakness.  Hematological: Does not bruise/bleed easily.  Psychiatric/Behavioral: Negative for sleep disturbance.    Objective:  Physical Exam Constitutional:      Appearance: Normal appearance. He is normal weight.  HENT:     Head: Normocephalic.     Nose: Nose normal.     Mouth/Throat:  Mouth: Mucous membranes are dry.  Cardiovascular:     Rate and Rhythm: Normal rate and regular rhythm.     Pulses: Normal pulses.     Heart sounds: Normal heart sounds.  Pulmonary:     Effort: Pulmonary effort is normal.     Breath sounds: Normal breath sounds.  Abdominal:     General: Abdomen is flat.     Palpations: Abdomen is soft.  Skin:    General: Skin is warm and dry.  Neurological:     Mental Status: He is alert.  Psychiatric:        Mood and Affect: Mood normal.        Behavior: Behavior normal.        Thought Content: Thought content normal.        Judgment: Judgment normal.   Ortho exam: ROM 2- 100 degrees.  He does have crepitance with range of motion.  Varus positioning of the knee is also noted.  Pseudolaxity with varus and valgus stressing.Mild effusion without warmth or erythema   Vital signs in last 24 hours: Pulse Rate:  [76] 76 (10/14 1515) BP: (130)/(67) 130/67 (10/14 1515) Weight:  [94.8 kg] 94.8 kg (10/14  1515)  Labs:   Estimated body mass index is 31.78 kg/m as calculated from the following:   Height as of 04/27/20: 5\' 8"  (1.727 m).   Weight as of 04/27/20: 94.8 kg.   Imaging Review Plain radiographs demonstrate moderate degenerative joint disease of the right knee(s). The overall alignment ismild varus. The bone quality appears to be good for age and reported activity level.      Assessment/Plan:  End stage arthritis, right knee   The patient history, physical examination, clinical judgment of the provider and imaging studies are consistent with end stage degenerative joint disease of the right knee(s) and total knee arthroplasty is deemed medically necessary. The treatment options including medical management, injection therapy arthroscopy and arthroplasty were discussed at length. The risks and benefits of total knee arthroplasty were presented and reviewed. The risks due to aseptic loosening, infection, stiffness, patella tracking problems, thromboembolic complications and other imponderables were discussed. The patient acknowledged the explanation, agreed to proceed with the plan and consent was signed. Patient is being admitted for inpatient treatment for surgery, pain control, PT, OT, prophylactic antibiotics, VTE prophylaxis, progressive ambulation and ADL's and discharge planning. The patient is planning to be discharged home with home health services    Patient's anticipated LOS is less than 2 midnights, meeting these requirements: - Younger than 29 - Lives within 1 hour of care - Has a competent adult at home to recover with post-op recover - NO history of  - Chronic pain requiring opiods  - Diabetes  - Coronary Artery Disease  - Heart failure  - Heart attack  - Stroke  - DVT/VTE  - Cardiac arrhythmia  - Respiratory Failure/COPD  - Renal failure  - Anemia  - Advanced Liver disease

## 2020-05-01 ENCOUNTER — Encounter (HOSPITAL_COMMUNITY)
Admission: RE | Admit: 2020-05-01 | Discharge: 2020-05-01 | Disposition: A | Discharge status: Home / Self Care | Payer: PRIVATE HEALTH INSURANCE | Source: Ambulatory Visit | Attending: Orthopaedic Surgery | Admitting: Orthopaedic Surgery

## 2020-05-01 ENCOUNTER — Ambulatory Visit (HOSPITAL_COMMUNITY)
Admission: RE | Admit: 2020-05-01 | Discharge: 2020-05-01 | Disposition: A | Discharge status: Home / Self Care | Payer: PRIVATE HEALTH INSURANCE | Source: Ambulatory Visit | Attending: Orthopedic Surgery | Admitting: Orthopedic Surgery

## 2020-05-01 ENCOUNTER — Other Ambulatory Visit: Payer: Self-pay

## 2020-05-01 ENCOUNTER — Encounter (HOSPITAL_COMMUNITY): Payer: Self-pay

## 2020-05-01 DIAGNOSIS — Z01818 Encounter for other preprocedural examination: Secondary | ICD-10-CM | POA: Diagnosis not present

## 2020-05-01 LAB — URINALYSIS, ROUTINE W REFLEX MICROSCOPIC
Bilirubin Urine: NEGATIVE
Glucose, UA: NEGATIVE mg/dL
Ketones, ur: NEGATIVE mg/dL
Leukocytes,Ua: NEGATIVE
Nitrite: NEGATIVE
Protein, ur: 30 mg/dL — AB
RBC / HPF: >50 RBC/hpf — ABNORMAL HIGH (ref 0–5)
Specific Gravity, Urine: 1.016 (ref 1.005–1.030)
pH: 5 (ref 5.0–8.0)

## 2020-05-01 LAB — COMPREHENSIVE METABOLIC PANEL
ALT: 61 U/L — ABNORMAL HIGH (ref 0–44)
AST: 55 U/L — ABNORMAL HIGH (ref 15–41)
Albumin: 3.8 g/dL (ref 3.5–5.0)
Alkaline Phosphatase: 40 U/L (ref 38–126)
Anion gap: 10 (ref 5–15)
BUN: 13 mg/dL (ref 8–23)
CO2: 24 mmol/L (ref 22–32)
Calcium: 9 mg/dL (ref 8.9–10.3)
Chloride: 101 mmol/L (ref 98–111)
Creatinine, Ser: 0.98 mg/dL (ref 0.61–1.24)
GFR, Estimated: >60 mL/min (ref >60)
Glucose, Bld: 121 mg/dL — ABNORMAL HIGH (ref 70–99)
Potassium: 4.3 mmol/L (ref 3.5–5.1)
Sodium: 135 mmol/L (ref 135–145)
Total Bilirubin: 0.6 mg/dL (ref 0.3–1.2)
Total Protein: 6.9 g/dL (ref 6.5–8.1)

## 2020-05-01 LAB — CBC WITH DIFFERENTIAL/PLATELET
Abs Immature Granulocytes: 0.03 10*3/uL (ref 0.00–0.07)
Basophils Absolute: 0.1 10*3/uL (ref 0.0–0.1)
Basophils Relative: 1 %
Eosinophils Absolute: 0.5 10*3/uL (ref 0.0–0.5)
Eosinophils Relative: 7 %
HCT: 48 % (ref 39.0–52.0)
Hemoglobin: 15.9 g/dL (ref 13.0–17.0)
Immature Granulocytes: 0 %
Lymphocytes Relative: 24 %
Lymphs Abs: 1.9 10*3/uL (ref 0.7–4.0)
MCH: 30.9 pg (ref 26.0–34.0)
MCHC: 33.1 g/dL (ref 30.0–36.0)
MCV: 93.4 fL (ref 80.0–100.0)
Monocytes Absolute: 0.7 10*3/uL (ref 0.1–1.0)
Monocytes Relative: 9 %
Neutro Abs: 4.8 10*3/uL (ref 1.7–7.7)
Neutrophils Relative %: 59 %
Platelets: 245 10*3/uL (ref 150–400)
RBC: 5.14 MIL/uL (ref 4.22–5.81)
RDW: 13.1 % (ref 11.5–15.5)
WBC: 8.1 10*3/uL (ref 4.0–10.5)
nRBC: 0 % (ref 0.0–0.2)

## 2020-05-01 LAB — PROTIME-INR
INR: 1.2 (ref 0.8–1.2)
Prothrombin Time: 14.3 seconds (ref 11.4–15.2)

## 2020-05-01 LAB — SURGICAL PCR SCREEN
MRSA, PCR: NEGATIVE
Staphylococcus aureus: NEGATIVE

## 2020-05-01 LAB — GLUCOSE, CAPILLARY: Glucose-Capillary: 120 mg/dL — ABNORMAL HIGH (ref 70–99)

## 2020-05-01 LAB — APTT: aPTT: 30 seconds (ref 24–36)

## 2020-05-01 NOTE — Progress Notes (Signed)
Urinalysis results from 05/01/2020 routed to Dr Durward Fortes for review.

## 2020-05-01 NOTE — Progress Notes (Signed)
Please check lab-do we need to have the PCP evaluate as well?

## 2020-05-01 NOTE — Patient Instructions (Signed)
DUE TO COVID-19 ONLY ONE VISITOR IS ALLOWED TO COME WITH YOU AND STAY IN THE WAITING ROOM ONLY DURING PRE OP AND PROCEDURE DAY OF SURGERY. THE 1 VISITOR  MAY VISIT WITH YOU AFTER SURGERY IN YOUR PRIVATE ROOM DURING VISITING HOURS ONLY!  YOU NEED TO HAVE A COVID 19 TEST ON Friday May 05, 2020 @ 8:15 am, THIS TEST MUST BE DONE BEFORE SURGERY,  COVID TESTING SITE [address] [place] [address], IT IS ON THE RIGHT GOING OUT [address] APPROXIMATELY  2 MINUTES PAST [employer] ON THE RIGHT. ONCE YOUR COVID TEST IS COMPLETED,  PLEASE BEGIN THE QUARANTINE INSTRUCTIONS AS OUTLINED IN YOUR HANDOUT.                Charles Trujillo  05/01/2020   Your procedure is scheduled on: Tuesday May 09, 2020   Report to [facility] Main  Entrance   Report to admitting at 0530 AM     Call this number if you have problems the morning of surgery [phone]    Remember: Do not eat food After Midnight. May take clear liquids from midnight till 0415 am day of surgery consuming entire G2 presurgery drink by 0415 then nothing by mouth.   BRUSH YOUR TEETH MORNING OF SURGERY AND RINSE YOUR MOUTH OUT, NO CHEWING GUM CANDY OR MINTS.     Take these medicines the morning of surgery with A SIP OF WATER: Metoprolol  DO NOT TAKE ANY DIABETIC MEDICATIONS DAY OF YOUR SURGERY                               You may not have any metal on your body including hair pins and              piercings  Do not wear jewelry,  lotions, powders or colognes, deodorant             Do not wear nail polish on your fingernails.               Men may shave face and neck.   Do not bring valuables to the hospital. [place].  Contacts, dentures or bridgework may not be worn into surgery.  Leave suitcase in the car. After surgery it may be brought to your room.     Patients discharged the day of surgery will not be allowed to drive home. IF YOU  ARE HAVING SURGERY AND GOING HOME THE SAME DAY, YOU MUST HAVE AN ADULT TO DRIVE YOU HOME AND BE WITH YOU FOR 24 HOURS. YOU MAY GO HOME BY TAXI OR UBER OR ORTHERWISE, BUT AN ADULT MUST ACCOMPANY YOU HOME AND STAY WITH YOU FOR 24 HOURS.  _____________________________________________________________________                CLEAR LIQUID DIET   Foods Allowed                                                                     Foods Excluded  Coffee and tea, regular and decaf  liquids that you cannot  Plain Jell-O any favor except red or purple                                           see through such as: Fruit ices (not with fruit pulp)                                     milk, soups, orange juice  Iced Popsicles                                    All solid food Carbonated beverages, regular and diet                                    Cranberry, grape and apple juices Sports drinks like Gatorade Lightly seasoned clear broth or consume(fat free) Sugar, honey syrup  Sample Menu Breakfast                                Lunch                                     Supper Cranberry juice                    Beef broth                            Chicken broth Jell-O                                     Grape juice                           Apple juice Coffee or tea                        Jell-O                                      Popsicle                                                Coffee or tea                        Coffee or tea  _____________________________________________________________________  [facility] - Preparing for Surgery Before surgery, you can play an important role.  Because skin is not sterile, your skin needs to be as free of germs as possible.  You can reduce the number of germs on your skin by washing with CHG (chlorahexidine gluconate) soap before surgery.  CHG is an antiseptic cleaner which kills  germs and bonds with the skin to continue  killing germs even after washing. Please DO NOT use if you have an allergy to CHG or antibacterial soaps.  If your skin becomes reddened/irritated stop using the CHG and inform your nurse when you arrive at Short Stay. Do not shave (including legs and underarms) for at least 48 hours prior to the first CHG shower.  You may shave your face/neck. Please follow these instructions carefully:  1.  Shower with CHG Soap the night before surgery and the  morning of Surgery.  2.  If you choose to wash your hair, wash your hair first as usual with your  normal  shampoo.  3.  After you shampoo, rinse your hair and body thoroughly to remove the  shampoo.                           4.  Use CHG as you would any other liquid soap.  You can apply chg directly  to the skin and wash                       Gently with a scrungie or clean washcloth.  5.  Apply the CHG Soap to your body ONLY FROM THE NECK DOWN.   Do not use on face/ open                           Wound or open sores. Avoid contact with eyes, ears mouth and genitals (private parts).                       Wash face,  Genitals (private parts) with your normal soap.             6.  Wash thoroughly, paying special attention to the area where your surgery  will be performed.  7.  Thoroughly rinse your body with warm water from the neck down.  8.  DO NOT shower/wash with your normal soap after using and rinsing off  the CHG Soap.                9.  Pat yourself dry with a clean towel.            10.  Wear clean pajamas.            11.  Place clean sheets on your bed the night of your first shower and do not  sleep with pets. Day of Surgery : Do not apply any lotions/deodorants the morning of surgery.  Please wear clean clothes to the hospital/surgery center.  FAILURE TO FOLLOW THESE INSTRUCTIONS MAY RESULT IN THE CANCELLATION OF YOUR SURGERY PATIENT SIGNATURE_________________________________  NURSE  SIGNATURE__________________________________  ________________________________________________________________________ How to Manage Your Diabetes Before and After Surgery  Why is it important to control my blood sugar before and after surgery? . Improving blood sugar levels before and after surgery helps healing and can limit problems. . A way of improving blood sugar control is eating a healthy diet by: o  Eating less sugar and carbohydrates o  Increasing activity/exercise o  Talking with your doctor about reaching your blood sugar goals . High blood sugars (greater than 180 mg/dL) can raise your risk of infections and slow your recovery, so you will need to focus on controlling your diabetes during the weeks before surgery. . Make sure that the doctor who takes  care of your diabetes knows about your planned surgery including the date and location.  How do I manage my blood sugar before surgery? . Check your blood sugar at least 4 times a day, starting 2 days before surgery, to make sure that the level is not too high or low. o Check your blood sugar the morning of your surgery when you wake up and every 2 hours until you get to the Short Stay unit. . If your blood sugar is less than 70 mg/dL, you will need to treat for low blood sugar: o Do not take insulin. o Treat a low blood sugar (less than 70 mg/dL) with  cup of clear juice (cranberry or apple), 4 glucose tablets, OR glucose gel. o Recheck blood sugar in 15 minutes after treatment (to make sure it is greater than 70 mg/dL). If your blood sugar is not greater than 70 mg/dL on recheck, call [phone] for further instructions. . Report your blood sugar to the short stay nurse when you get to Short Stay.  . If you are admitted to the hospital after surgery: o Your blood sugar will be checked by the staff and you will probably be given insulin after surgery (instead of oral diabetes medicines) to make sure you have good blood sugar  levels. o The goal for blood sugar control after surgery is 80-180 mg/dL.   WHAT DO I DO ABOUT MY DIABETES MEDICATION?  Marland Kitchen Do not take oral diabetes medicines (pills) the morning of surgery.   . The day of surgery, do not take other diabetes injectables, including Byetta (exenatide), Bydureon (exenatide ER), Victoza (liraglutide), or Trulicity (dulaglutide). .  Patient Signature:  Date:   Nurse Signature:  Date:   Reviewed and Endorsed by [facility] Patient Education Committee, August 2015  Incentive Spirometer  An incentive spirometer is a tool that can help keep your lungs clear and active. This tool measures how well you are filling your lungs with each breath. Taking long deep breaths may help reverse or decrease the chance of developing breathing (pulmonary) problems (especially infection) following:  A long period of time when you are unable to move or be active. BEFORE THE PROCEDURE   If the spirometer includes an indicator to show your best effort, your nurse or respiratory therapist will set it to a desired goal.  If possible, sit up straight or lean slightly forward. Try not to slouch.  Hold the incentive spirometer in an upright position. INSTRUCTIONS FOR USE  1. Sit on the edge of your bed if possible, or sit up as far as you can in bed or on a chair. 2. Hold the incentive spirometer in an upright position. 3. Breathe out normally. 4. Place the mouthpiece in your mouth and seal your lips tightly around it. 5. Breathe in slowly and as deeply as possible, raising the piston or the ball toward the top of the column. 6. Hold your breath for 3-5 seconds or for as long as possible. Allow the piston or ball to fall to the bottom of the column. 7. Remove the mouthpiece from your mouth and breathe out normally. 8. Rest for a few seconds and repeat Steps 1 through 7 at least 10 times every 1-2 hours when you are awake. Take your time and take a few normal breaths between deep  breaths. 9. The spirometer may include an indicator to show your best effort. Use the indicator as a goal to work toward during each repetition. 10. After each  set of 10 deep breaths, practice coughing to be sure your lungs are clear. If you have an incision (the cut made at the time of surgery), support your incision when coughing by placing a pillow or rolled up towels firmly against it. Once you are able to get out of bed, walk around indoors and cough well. You may stop using the incentive spirometer when instructed by your caregiver.  RISKS AND COMPLICATIONS  Take your time so you do not get dizzy or light-headed.  If you are in pain, you may need to take or ask for pain medication before doing incentive spirometry. It is harder to take a deep breath if you are having pain. AFTER USE  Rest and breathe slowly and easily.  It can be helpful to keep track of a log of your progress. Your caregiver can provide you with a simple table to help with this. If you are using the spirometer at home, follow these instructions: [place] IF:   You are having difficultly using the spirometer.  You have trouble using the spirometer as often as instructed.  Your pain medication is not giving enough relief while using the spirometer.  You develop fever of 100.5 F (38.1 C) or higher. SEEK IMMEDIATE MEDICAL CARE IF:   You cough up bloody sputum that had not been present before.  You develop fever of 102 F (38.9 C) or greater.  You develop worsening pain at or near the incision site. MAKE SURE YOU:   Understand these instructions.  Will watch your condition.  Will get help right away if you are not doing well or get worse. Document Released: 11/11/2006 Document Revised: 09/23/2011 Document Reviewed: 01/12/2007 ExitCare Patient Information 2014 ExitCare, [place].   ________________________________________________________________________  WHAT IS A BLOOD TRANSFUSION? Blood  Transfusion Information  A transfusion is the replacement of blood or some of its parts. Blood is made up of multiple cells which provide different functions.  Red blood cells carry oxygen and are used for blood loss replacement.  White blood cells fight against infection.  Platelets control bleeding.  Plasma helps clot blood.  Other blood products are available for specialized needs, such as hemophilia or other clotting disorders. BEFORE THE TRANSFUSION  Who gives blood for transfusions?   Healthy volunteers who are fully evaluated to make sure their blood is safe. This is blood bank blood. Transfusion therapy is the safest it has ever been in the practice of medicine. Before blood is taken from a donor, a complete history is taken to make sure that person has no history of diseases nor engages in risky social behavior (examples are intravenous drug use or sexual activity with multiple partners). The donor's travel history is screened to minimize risk of transmitting infections, such as malaria. The donated blood is tested for signs of infectious diseases, such as HIV and hepatitis. The blood is then tested to be sure it is compatible with you in order to minimize the chance of a transfusion reaction. If you or a relative donates blood, this is often done in anticipation of surgery and is not appropriate for emergency situations. It takes many days to process the donated blood. RISKS AND COMPLICATIONS Although transfusion therapy is very safe and saves many lives, the main dangers of transfusion include:   Getting an infectious disease.  Developing a transfusion reaction. This is an allergic reaction to something in the blood you were given. Every precaution is taken to prevent this. The decision to have a  blood transfusion has been considered carefully by your caregiver before blood is given. Blood is not given unless the benefits outweigh the risks. AFTER THE TRANSFUSION  Right after  receiving a blood transfusion, you will usually feel much better and more energetic. This is especially true if your red blood cells have gotten low (anemic). The transfusion raises the level of the red blood cells which carry oxygen, and this usually causes an energy increase.  The nurse administering the transfusion will monitor you carefully for complications. HOME CARE INSTRUCTIONS  No special instructions are needed after a transfusion. You may find your energy is better. Speak with your caregiver about any limitations on activity for underlying diseases you may have. SEEK MEDICAL CARE IF:   Your condition is not improving after your transfusion.  You develop redness or irritation at the intravenous (IV) site. SEEK IMMEDIATE MEDICAL CARE IF:  Any of the following symptoms occur over the next 12 hours:  Shaking chills.  You have a temperature by mouth above 102 F (38.9 C), not controlled by medicine.  Chest, back, or muscle pain.  People around you feel you are not acting correctly or are confused.  Shortness of breath or difficulty breathing.  Dizziness and fainting.  You get a rash or develop hives.  You have a decrease in urine output.  Your urine turns a dark color or changes to pink, red, or brown. Any of the following symptoms occur over the next 10 days:  You have a temperature by mouth above 102 F (38.9 C), not controlled by medicine.  Shortness of breath.  Weakness after normal activity.  The white part of the eye turns yellow (jaundice).  You have a decrease in the amount of urine or are urinating less often.  Your urine turns a dark color or changes to pink, red, or brown. Document Released: 06/28/2000 Document Revised: 09/23/2011 Document Reviewed: 02/15/2008 [facility] Patient Information 2014 [place], [place].  _______________________________________________________________________

## 2020-05-01 NOTE — Progress Notes (Signed)
PCP: Dr Cheri Rous (clearance per chart) 03/13/2020 noted that pt is cleared medically only  CARDIOLOGIST: Dr Howell Rucks  (Clearance per chart) 04/22/2019  EKG per epic 05/01/2020  DM type 2 pt states checks cbgs monthly  On Janumet [phone] daily  CBG 120 per PAT visit  Patient verbalized understanding of instructions that were given to them at the PAT appointment. Patient was also instructed that they will need to review over the PAT instructions again at home before surgery.

## 2020-05-02 LAB — URINE CULTURE: Culture: NO GROWTH

## 2020-05-05 ENCOUNTER — Other Ambulatory Visit: Payer: Self-pay

## 2020-05-05 ENCOUNTER — Other Ambulatory Visit (HOSPITAL_COMMUNITY)
Admission: RE | Admit: 2020-05-05 | Discharge: 2020-05-05 | Disposition: A | Discharge status: Home / Self Care | Payer: PRIVATE HEALTH INSURANCE | Source: Ambulatory Visit | Attending: Orthopaedic Surgery | Admitting: Orthopaedic Surgery

## 2020-05-05 ENCOUNTER — Encounter: Payer: Self-pay | Admitting: Family Medicine

## 2020-05-05 DIAGNOSIS — Z01812 Encounter for preprocedural laboratory examination: Secondary | ICD-10-CM | POA: Insufficient documentation

## 2020-05-05 DIAGNOSIS — Z20822 Contact with and (suspected) exposure to covid-19: Secondary | ICD-10-CM | POA: Insufficient documentation

## 2020-05-05 LAB — SARS CORONAVIRUS 2 (TAT 6-24 HRS): SARS Coronavirus 2: NEGATIVE

## 2020-05-05 MED ORDER — JANUMET XR 100-1000 MG PO TB24
1.0000 | ORAL_TABLET | Freq: Every day | ORAL | 1 refills | Status: DC
Start: 2020-05-05 — End: 2020-11-10

## 2020-05-08 NOTE — Anesthesia Preprocedure Evaluation (Addendum)
Anesthesia Evaluation  Patient identified by MRN, date of birth, ID band Patient awake    Reviewed: Allergy & Precautions, NPO status , Patient's Chart, lab work & pertinent test results  Airway Mallampati: II  TM Distance: >3 FB Neck ROM: Full    Dental no notable dental hx.    Pulmonary Current Smoker,    Pulmonary exam normal breath sounds clear to auscultation       Cardiovascular Exercise Tolerance: Good hypertension, + Peripheral Vascular Disease  Normal cardiovascular exam+ dysrhythmias (tachycardia on Cardia)  Rhythm:Regular Rate:Normal     Neuro/Psych PSYCHIATRIC DISORDERS Anxiety Depression    GI/Hepatic negative GI ROS, Neg liver ROS, GERD  ,  Endo/Other  diabetes  Renal/GU   negative genitourinary   Musculoskeletal  (+) Arthritis ,   Abdominal Normal abdominal exam  (+)   Peds negative pediatric ROS (+)  Hematology negative hematology ROS (+)   Anesthesia Other Findings   Reproductive/Obstetrics                            Anesthesia Physical Anesthesia Plan  ASA: III  Anesthesia Plan: Regional and Spinal   Post-op Pain Management:    Induction: Intravenous  PONV Risk Score and Plan: TIVA, Treatment may vary due to age or medical condition and Propofol infusion  Airway Management Planned: Natural Airway and Simple Face Mask  Additional Equipment:   Intra-op Plan:   Post-operative Plan:   Informed Consent: I have reviewed the patients History and Physical, chart, labs and discussed the procedure including the risks, benefits and alternatives for the proposed anesthesia with the patient or authorized representative who has indicated his/her understanding and acceptance.       Plan Discussed with: CRNA and Anesthesiologist  Anesthesia Plan Comments: (MAC. Spinal. GA/LMA as backup plan. Adductor canal block for postop pain control)      Anesthesia Quick  Evaluation

## 2020-05-09 ENCOUNTER — Ambulatory Visit (HOSPITAL_COMMUNITY): Payer: PRIVATE HEALTH INSURANCE | Admitting: Anesthesiology

## 2020-05-09 ENCOUNTER — Encounter (HOSPITAL_COMMUNITY)
Admission: RE | Disposition: A | Discharge status: Home / Self Care | Payer: Self-pay | Source: Home / Self Care | Attending: Orthopaedic Surgery

## 2020-05-09 ENCOUNTER — Other Ambulatory Visit: Payer: Self-pay

## 2020-05-09 ENCOUNTER — Encounter (HOSPITAL_COMMUNITY): Payer: Self-pay | Admitting: Orthopaedic Surgery

## 2020-05-09 ENCOUNTER — Observation Stay (HOSPITAL_COMMUNITY)
Admission: RE | Admit: 2020-05-09 | Discharge: 2020-05-10 | Disposition: A | Discharge status: Home / Self Care | Payer: PRIVATE HEALTH INSURANCE | Attending: Orthopaedic Surgery | Admitting: Orthopaedic Surgery

## 2020-05-09 ENCOUNTER — Telehealth (HOSPITAL_COMMUNITY): Payer: Self-pay | Admitting: *Deleted

## 2020-05-09 ENCOUNTER — Ambulatory Visit (HOSPITAL_COMMUNITY): Payer: PRIVATE HEALTH INSURANCE | Admitting: Physician Assistant

## 2020-05-09 DIAGNOSIS — E785 Hyperlipidemia, unspecified: Secondary | ICD-10-CM | POA: Insufficient documentation

## 2020-05-09 DIAGNOSIS — M1712 Unilateral primary osteoarthritis, left knee: Secondary | ICD-10-CM

## 2020-05-09 DIAGNOSIS — M1711 Unilateral primary osteoarthritis, right knee: Principal | ICD-10-CM | POA: Insufficient documentation

## 2020-05-09 DIAGNOSIS — I10 Essential (primary) hypertension: Secondary | ICD-10-CM | POA: Insufficient documentation

## 2020-05-09 DIAGNOSIS — E1169 Type 2 diabetes mellitus with other specified complication: Secondary | ICD-10-CM | POA: Insufficient documentation

## 2020-05-09 DIAGNOSIS — Z85828 Personal history of other malignant neoplasm of skin: Secondary | ICD-10-CM | POA: Diagnosis not present

## 2020-05-09 DIAGNOSIS — IMO0002 Reserved for concepts with insufficient information to code with codable children: Secondary | ICD-10-CM

## 2020-05-09 DIAGNOSIS — E1165 Type 2 diabetes mellitus with hyperglycemia: Secondary | ICD-10-CM | POA: Diagnosis not present

## 2020-05-09 DIAGNOSIS — Z96651 Presence of right artificial knee joint: Secondary | ICD-10-CM

## 2020-05-09 DIAGNOSIS — F17290 Nicotine dependence, other tobacco product, uncomplicated: Secondary | ICD-10-CM | POA: Insufficient documentation

## 2020-05-09 DIAGNOSIS — Z96652 Presence of left artificial knee joint: Secondary | ICD-10-CM | POA: Diagnosis not present

## 2020-05-09 DIAGNOSIS — M19071 Primary osteoarthritis, right ankle and foot: Secondary | ICD-10-CM

## 2020-05-09 DIAGNOSIS — Z79899 Other long term (current) drug therapy: Secondary | ICD-10-CM | POA: Diagnosis not present

## 2020-05-09 DIAGNOSIS — E1151 Type 2 diabetes mellitus with diabetic peripheral angiopathy without gangrene: Secondary | ICD-10-CM

## 2020-05-09 HISTORY — PX: TOTAL KNEE ARTHROPLASTY: SHX125

## 2020-05-09 LAB — GLUCOSE, CAPILLARY
Glucose-Capillary: 147 mg/dL — ABNORMAL HIGH (ref 70–99)
Glucose-Capillary: 151 mg/dL — ABNORMAL HIGH (ref 70–99)
Glucose-Capillary: 165 mg/dL — ABNORMAL HIGH (ref 70–99)
Glucose-Capillary: 263 mg/dL — ABNORMAL HIGH (ref 70–99)
Glucose-Capillary: 244 mg/dL — ABNORMAL HIGH (ref 70–99)

## 2020-05-09 LAB — TYPE AND SCREEN
ABO/RH(D): O POS
Antibody Screen: NEGATIVE

## 2020-05-09 SURGERY — ARTHROPLASTY, KNEE, TOTAL
Anesthesia: Regional | Site: Knee | Laterality: Right

## 2020-05-09 MED ORDER — DIPHENHYDRAMINE HCL 12.5 MG/5ML PO ELIX: 12.5000 mg | ORAL_SOLUTION | ORAL | Status: DC | PRN

## 2020-05-09 MED ORDER — MIDAZOLAM HCL 2 MG/2ML IJ SOLN
INTRAMUSCULAR | Status: AC
  Filled 2020-05-09: qty 2

## 2020-05-09 MED ORDER — PHENOL 1.4 % MT LIQD: 1.0000 | OROMUCOSAL | Status: DC | PRN

## 2020-05-09 MED ORDER — TRANEXAMIC ACID-NACL 1000-0.7 MG/100ML-% IV SOLN
INTRAVENOUS | Status: AC
  Filled 2020-05-09: qty 100

## 2020-05-09 MED ORDER — DOCUSATE SODIUM 100 MG PO CAPS
100.0000 mg | ORAL_CAPSULE | Freq: Two times a day (BID) | ORAL | Status: DC
  Administered 2020-05-09 – 2020-05-10 (×2): 100 mg via ORAL
  Filled 2020-05-09 (×2): qty 1

## 2020-05-09 MED ORDER — CEFAZOLIN SODIUM-DEXTROSE 2-4 GM/100ML-% IV SOLN
2.0000 g | INTRAVENOUS | Status: AC
  Administered 2020-05-09: 2 g via INTRAVENOUS

## 2020-05-09 MED ORDER — DEXAMETHASONE SODIUM PHOSPHATE 10 MG/ML IJ SOLN
INTRAMUSCULAR | Status: AC
  Filled 2020-05-09: qty 1

## 2020-05-09 MED ORDER — SODIUM CHLORIDE 0.9 % IV SOLN
75.0000 mL/h | INTRAVENOUS | Status: DC
  Administered 2020-05-09: 75 mL/h via INTRAVENOUS

## 2020-05-09 MED ORDER — MAGNESIUM CITRATE PO SOLN: 1.0000 | Freq: Once | ORAL | Status: DC | PRN

## 2020-05-09 MED ORDER — ATORVASTATIN CALCIUM 20 MG PO TABS
20.0000 mg | ORAL_TABLET | Freq: Every day | ORAL | Status: DC
  Administered 2020-05-09: 20 mg via ORAL
  Filled 2020-05-09 (×2): qty 1

## 2020-05-09 MED ORDER — DILTIAZEM HCL ER COATED BEADS 120 MG PO CP24
120.0000 mg | ORAL_CAPSULE | Freq: Every day | ORAL | Status: DC
  Administered 2020-05-09 – 2020-05-10 (×2): 120 mg via ORAL
  Filled 2020-05-09 (×2): qty 1

## 2020-05-09 MED ORDER — ORAL CARE MOUTH RINSE: 15.0000 mL | Freq: Once | OROMUCOSAL | Status: AC

## 2020-05-09 MED ORDER — BUPIVACAINE HCL (PF) 0.5 % IJ SOLN
INTRAMUSCULAR | Status: DC | PRN
  Administered 2020-05-09: 20 mL via PERINEURAL

## 2020-05-09 MED ORDER — KETOROLAC TROMETHAMINE 15 MG/ML IJ SOLN
7.5000 mg | Freq: Four times a day (QID) | INTRAMUSCULAR | Status: AC
  Administered 2020-05-09 – 2020-05-10 (×4): 7.5 mg via INTRAVENOUS
  Filled 2020-05-09 (×4): qty 1

## 2020-05-09 MED ORDER — METOCLOPRAMIDE HCL 5 MG PO TABS: 5.0000 mg | ORAL_TABLET | Freq: Three times a day (TID) | ORAL | Status: DC | PRN

## 2020-05-09 MED ORDER — CHLORHEXIDINE GLUCONATE 0.12 % MT SOLN
15.0000 mL | Freq: Once | OROMUCOSAL | Status: AC
  Administered 2020-05-09: 15 mL via OROMUCOSAL

## 2020-05-09 MED ORDER — SODIUM CHLORIDE 0.9 % IR SOLN
Status: DC | PRN
  Administered 2020-05-09: 1000 mL

## 2020-05-09 MED ORDER — METHOCARBAMOL 500 MG PO TABS: 500.0000 mg | ORAL_TABLET | Freq: Four times a day (QID) | ORAL | Status: DC | PRN

## 2020-05-09 MED ORDER — PHENYLEPHRINE HCL-NACL 10-0.9 MG/250ML-% IV SOLN
INTRAVENOUS | Status: DC | PRN
  Administered 2020-05-09: 50 ug/min via INTRAVENOUS

## 2020-05-09 MED ORDER — HYDROMORPHONE HCL 1 MG/ML IJ SOLN: 0.5000 mg | INTRAMUSCULAR | Status: DC | PRN

## 2020-05-09 MED ORDER — NYSTATIN-TRIAMCINOLONE 100000-0.1 UNIT/GM-% EX OINT
1.0000 "application " | TOPICAL_OINTMENT | Freq: Two times a day (BID) | CUTANEOUS | Status: DC
  Administered 2020-05-09 – 2020-05-10 (×2): 1 via TOPICAL
  Filled 2020-05-09: qty 15

## 2020-05-09 MED ORDER — DEXAMETHASONE SODIUM PHOSPHATE 10 MG/ML IJ SOLN
INTRAMUSCULAR | Status: DC | PRN
  Administered 2020-05-09: 10 mg via INTRAVENOUS

## 2020-05-09 MED ORDER — INSULIN ASPART 100 UNIT/ML ~~LOC~~ SOLN
0.0000 [IU] | Freq: Three times a day (TID) | SUBCUTANEOUS | Status: DC
  Administered 2020-05-09: 8 [IU] via SUBCUTANEOUS
  Administered 2020-05-09: 3 [IU] via SUBCUTANEOUS
  Administered 2020-05-10: 5 [IU] via SUBCUTANEOUS

## 2020-05-09 MED ORDER — LACTATED RINGERS IV SOLN: INTRAVENOUS | Status: DC

## 2020-05-09 MED ORDER — ACETAMINOPHEN 325 MG PO TABS
650.0000 mg | ORAL_TABLET | Freq: Four times a day (QID) | ORAL | Status: AC
  Administered 2020-05-09 – 2020-05-10 (×4): 650 mg via ORAL
  Filled 2020-05-09 (×4): qty 2

## 2020-05-09 MED ORDER — LIDOCAINE 2% (20 MG/ML) 5 ML SYRINGE
INTRAMUSCULAR | Status: AC
  Filled 2020-05-09: qty 5

## 2020-05-09 MED ORDER — ASPIRIN 81 MG PO CHEW
81.0000 mg | CHEWABLE_TABLET | Freq: Two times a day (BID) | ORAL | Status: DC
  Administered 2020-05-09 – 2020-05-10 (×2): 81 mg via ORAL
  Filled 2020-05-09 (×2): qty 1

## 2020-05-09 MED ORDER — FENTANYL CITRATE (PF) 100 MCG/2ML IJ SOLN: 25.0000 ug | INTRAMUSCULAR | Status: DC | PRN

## 2020-05-09 MED ORDER — ONDANSETRON HCL 4 MG/2ML IJ SOLN: 4.0000 mg | Freq: Four times a day (QID) | INTRAMUSCULAR | Status: DC | PRN

## 2020-05-09 MED ORDER — HYDROMORPHONE HCL 2 MG PO TABS
2.0000 mg | ORAL_TABLET | ORAL | Status: DC | PRN
  Administered 2020-05-09 – 2020-05-10 (×2): 2 mg via ORAL
  Filled 2020-05-09 (×2): qty 1

## 2020-05-09 MED ORDER — TRANEXAMIC ACID-NACL 1000-0.7 MG/100ML-% IV SOLN
1000.0000 mg | INTRAVENOUS | Status: AC
  Administered 2020-05-09: 1000 mg via INTRAVENOUS
  Administered 2020-05-09: 20 mg via INTRAVENOUS

## 2020-05-09 MED ORDER — ACETAMINOPHEN 500 MG PO TABS
ORAL_TABLET | ORAL | Status: AC
  Administered 2020-05-09: 1000 mg via ORAL
  Filled 2020-05-09: qty 2

## 2020-05-09 MED ORDER — MIDAZOLAM HCL 5 MG/5ML IJ SOLN
INTRAMUSCULAR | Status: DC | PRN
  Administered 2020-05-09 (×2): 1 mg via INTRAVENOUS

## 2020-05-09 MED ORDER — FENTANYL CITRATE (PF) 100 MCG/2ML IJ SOLN
INTRAMUSCULAR | Status: DC | PRN
  Administered 2020-05-09 (×2): 50 ug via INTRAVENOUS

## 2020-05-09 MED ORDER — PROPOFOL 10 MG/ML IV BOLUS
INTRAVENOUS | Status: AC
  Filled 2020-05-09: qty 20

## 2020-05-09 MED ORDER — MENTHOL 3 MG MT LOZG: 1.0000 | LOZENGE | OROMUCOSAL | Status: DC | PRN

## 2020-05-09 MED ORDER — LISINOPRIL 10 MG PO TABS
10.0000 mg | ORAL_TABLET | Freq: Every day | ORAL | Status: DC
  Administered 2020-05-09 – 2020-05-10 (×2): 10 mg via ORAL
  Filled 2020-05-09 (×2): qty 1

## 2020-05-09 MED ORDER — POVIDONE-IODINE 10 % EX SWAB
2.0000 "application " | Freq: Once | CUTANEOUS | Status: AC
  Administered 2020-05-09: 2 via TOPICAL

## 2020-05-09 MED ORDER — CEFAZOLIN SODIUM-DEXTROSE 2-4 GM/100ML-% IV SOLN
INTRAVENOUS | Status: AC
  Filled 2020-05-09: qty 100

## 2020-05-09 MED ORDER — METOPROLOL TARTRATE 50 MG PO TABS
50.0000 mg | ORAL_TABLET | Freq: Every day | ORAL | Status: DC
  Administered 2020-05-10: 50 mg via ORAL
  Filled 2020-05-09: qty 1

## 2020-05-09 MED ORDER — MAGNESIUM HYDROXIDE 400 MG/5ML PO SUSP: 30.0000 mL | Freq: Every day | ORAL | Status: DC | PRN

## 2020-05-09 MED ORDER — INSULIN ASPART 100 UNIT/ML ~~LOC~~ SOLN
0.0000 [IU] | Freq: Every day | SUBCUTANEOUS | Status: DC
  Administered 2020-05-09: 2 [IU] via SUBCUTANEOUS

## 2020-05-09 MED ORDER — ACETAMINOPHEN 500 MG PO TABS: 1000.0000 mg | ORAL_TABLET | Freq: Once | ORAL | Status: AC

## 2020-05-09 MED ORDER — BUPIVACAINE-EPINEPHRINE 0.5% -1:200000 IJ SOLN
INTRAMUSCULAR | Status: DC | PRN
  Administered 2020-05-09: 30 mL

## 2020-05-09 MED ORDER — SODIUM CHLORIDE 0.9 % IV SOLN: INTRAVENOUS | Status: DC

## 2020-05-09 MED ORDER — PHENYLEPHRINE HCL (PRESSORS) 10 MG/ML IV SOLN
INTRAVENOUS | Status: AC
  Filled 2020-05-09: qty 1

## 2020-05-09 MED ORDER — METOCLOPRAMIDE HCL 5 MG/ML IJ SOLN: 5.0000 mg | Freq: Three times a day (TID) | INTRAMUSCULAR | Status: DC | PRN

## 2020-05-09 MED ORDER — BISACODYL 10 MG RE SUPP: 10.0000 mg | Freq: Every day | RECTAL | Status: DC | PRN

## 2020-05-09 MED ORDER — 0.9 % SODIUM CHLORIDE (POUR BTL) OPTIME
TOPICAL | Status: DC | PRN
  Administered 2020-05-09: 1000 mL

## 2020-05-09 MED ORDER — PROPOFOL 10 MG/ML IV BOLUS
INTRAVENOUS | Status: DC | PRN
  Administered 2020-05-09: 20 mg via INTRAVENOUS

## 2020-05-09 MED ORDER — ONDANSETRON HCL 4 MG/2ML IJ SOLN: 4.0000 mg | Freq: Once | INTRAMUSCULAR | Status: DC | PRN

## 2020-05-09 MED ORDER — METHOCARBAMOL 500 MG IVPB - SIMPLE MED
500.0000 mg | Freq: Four times a day (QID) | INTRAVENOUS | Status: DC | PRN
  Filled 2020-05-09: qty 50

## 2020-05-09 MED ORDER — ONDANSETRON HCL 4 MG/2ML IJ SOLN
INTRAMUSCULAR | Status: AC
  Filled 2020-05-09: qty 2

## 2020-05-09 MED ORDER — BUPIVACAINE IN DEXTROSE 0.75-8.25 % IT SOLN
INTRATHECAL | Status: DC | PRN
  Administered 2020-05-09: 1.8 mL via INTRATHECAL

## 2020-05-09 MED ORDER — BUPIVACAINE-EPINEPHRINE (PF) 0.5% -1:200000 IJ SOLN
INTRAMUSCULAR | Status: AC
  Filled 2020-05-09: qty 30

## 2020-05-09 MED ORDER — PROPOFOL 500 MG/50ML IV EMUL
INTRAVENOUS | Status: DC | PRN
  Administered 2020-05-09: 100 ug/kg/min via INTRAVENOUS

## 2020-05-09 MED ORDER — PROPOFOL 500 MG/50ML IV EMUL
INTRAVENOUS | Status: AC
  Filled 2020-05-09: qty 50

## 2020-05-09 MED ORDER — CEFAZOLIN SODIUM-DEXTROSE 2-4 GM/100ML-% IV SOLN
2.0000 g | Freq: Four times a day (QID) | INTRAVENOUS | Status: AC
  Administered 2020-05-09 (×2): 2 g via INTRAVENOUS
  Filled 2020-05-09 (×2): qty 100

## 2020-05-09 MED ORDER — TRANEXAMIC ACID-NACL 1000-0.7 MG/100ML-% IV SOLN
1000.0000 mg | Freq: Once | INTRAVENOUS | Status: AC
  Administered 2020-05-09: 1000 mg via INTRAVENOUS
  Filled 2020-05-09: qty 100

## 2020-05-09 MED ORDER — ONDANSETRON HCL 4 MG PO TABS: 4.0000 mg | ORAL_TABLET | Freq: Four times a day (QID) | ORAL | Status: DC | PRN

## 2020-05-09 MED ORDER — ALUM & MAG HYDROXIDE-SIMETH 200-200-20 MG/5ML PO SUSP: 30.0000 mL | ORAL | Status: DC | PRN

## 2020-05-09 MED ORDER — FENTANYL CITRATE (PF) 100 MCG/2ML IJ SOLN
INTRAMUSCULAR | Status: AC
  Filled 2020-05-09: qty 2

## 2020-05-09 SURGICAL SUPPLY — 60 items
BAG DECANTER FOR FLEXI CONT (MISCELLANEOUS) ×1 IMPLANT
BAG SPEC THK2 15X12 ZIP CLS (MISCELLANEOUS) ×1
BAG ZIPLOCK 12X15 (MISCELLANEOUS) ×3 IMPLANT
BLADE SAGITTAL 25.0X1.19X90 (BLADE) ×2 IMPLANT
BLADE SAGITTAL 25.0X1.19X90MM (BLADE) ×1
BNDG CMPR 82X61 PLY HI ABS (GAUZE/BANDAGES/DRESSINGS) ×1
BNDG CONFORM 6X.82 1P STRL (GAUZE/BANDAGES/DRESSINGS) ×2 IMPLANT
BOWL SMART MIX CTS (DISPOSABLE) ×3 IMPLANT
CEMENT HV SMART SET (Cement) ×6 IMPLANT
CEMENT TIBIA MBT SIZE 5 (Knees) IMPLANT
COMP FEM CEM STD+ RT LCS (Orthopedic Implant) ×3 IMPLANT
COMP PATELLA PEGX3 CEM STAN+ (Knees) ×3 IMPLANT
COMPONENT FEM CEM STD+ RT LCS (Orthopedic Implant) IMPLANT
COMPONENT PTLA PEGX3 CEM STAN+ (Knees) IMPLANT
COVER SURGICAL LIGHT HANDLE (MISCELLANEOUS) ×3 IMPLANT
COVER WAND RF STERILE (DRAPES) ×2 IMPLANT
CUFF TOURN SGL QUICK 34 (TOURNIQUET CUFF) ×3
CUFF TRNQT CYL 34X4.125X (TOURNIQUET CUFF) ×1 IMPLANT
DECANTER SPIKE VIAL GLASS SM (MISCELLANEOUS) ×3 IMPLANT
DRAPE IMP U-DRAPE 54X76 (DRAPES) ×3 IMPLANT
DRAPE ORTHO SPLIT 77X108 STRL (DRAPES) ×3
DRAPE SHEET LG 3/4 BI-LAMINATE (DRAPES) ×6 IMPLANT
DRAPE SURG ORHT 6 SPLT 77X108 (DRAPES) IMPLANT
DRSG ADAPTIC 3X8 NADH LF (GAUZE/BANDAGES/DRESSINGS) ×3 IMPLANT
DRSG PAD ABDOMINAL 8X10 ST (GAUZE/BANDAGES/DRESSINGS) ×3 IMPLANT
DRSG VASELINE 3X18 (GAUZE/BANDAGES/DRESSINGS) ×2 IMPLANT
DURAPREP 26ML APPLICATOR (WOUND CARE) ×6 IMPLANT
ELECT REM PT RETURN 15FT ADLT (MISCELLANEOUS) ×3 IMPLANT
GAUZE SPONGE 4X4 12PLY STRL (GAUZE/BANDAGES/DRESSINGS) ×3 IMPLANT
GLOVE BIOGEL PI IND STRL 8 (GLOVE) ×1 IMPLANT
GLOVE BIOGEL PI IND STRL 8.5 (GLOVE) ×1 IMPLANT
GLOVE BIOGEL PI INDICATOR 8 (GLOVE) ×2
GLOVE BIOGEL PI INDICATOR 8.5 (GLOVE) ×2
GLOVE ECLIPSE 8.0 STRL XLNG CF (GLOVE) ×6 IMPLANT
GLOVE ECLIPSE 8.5 STRL (GLOVE) ×6 IMPLANT
GOWN STRL REUS W/ TWL LRG LVL3 (GOWN DISPOSABLE) ×1 IMPLANT
GOWN STRL REUS W/TWL 2XL LVL3 (GOWN DISPOSABLE) ×3 IMPLANT
GOWN STRL REUS W/TWL LRG LVL3 (GOWN DISPOSABLE) ×3
HANDPIECE INTERPULSE COAX TIP (DISPOSABLE) ×3
HOLDER FOLEY CATH W/STRAP (MISCELLANEOUS) ×2 IMPLANT
INSERT TIB LCS RP STD+ 12.5 (Knees) ×2 IMPLANT
KIT TURNOVER KIT A (KITS) ×2 IMPLANT
MANIFOLD NEPTUNE II (INSTRUMENTS) ×3 IMPLANT
NS IRRIG 1000ML POUR BTL (IV SOLUTION) ×3 IMPLANT
PACK TOTAL KNEE CUSTOM (KITS) ×3 IMPLANT
PADDING CAST COTTON 6X4 STRL (CAST SUPPLIES) ×6 IMPLANT
PENCIL SMOKE EVACUATOR (MISCELLANEOUS) ×2 IMPLANT
PIN STEINMAN FIXATION KNEE (PIN) ×2 IMPLANT
PROTECTOR NERVE ULNAR (MISCELLANEOUS) ×3 IMPLANT
SET HNDPC FAN SPRY TIP SCT (DISPOSABLE) ×1 IMPLANT
STAPLER VISISTAT 35W (STAPLE) ×3 IMPLANT
SUT BONE WAX W31G (SUTURE) ×3 IMPLANT
SUT ETHIBOND NAB CT1 #1 30IN (SUTURE) ×6 IMPLANT
SUT MNCRL AB 3-0 PS2 18 (SUTURE) ×3 IMPLANT
SUT VIC AB 2-0 PS2 27 (SUTURE) ×3 IMPLANT
TIBIA MBT CEMENT SIZE 5 (Knees) ×3 IMPLANT
TRAY FOLEY MTR SLVR 16FR STAT (SET/KITS/TRAYS/PACK) ×3 IMPLANT
UNDERPAD 30X36 HEAVY ABSORB (UNDERPADS AND DIAPERS) ×3 IMPLANT
WATER STERILE IRR 1000ML POUR (IV SOLUTION) ×6 IMPLANT
WRAP KNEE MAXI GEL POST OP (GAUZE/BANDAGES/DRESSINGS) ×3 IMPLANT

## 2020-05-09 NOTE — Anesthesia Procedure Notes (Signed)
Date/Time: 05/09/2020 7:23 AM Performed by: Sharlette Dense, CRNA Oxygen Delivery Method: Simple face mask

## 2020-05-09 NOTE — Anesthesia Postprocedure Evaluation (Signed)
Anesthesia Post Note  Patient: Charles Trujillo  Procedure(s) Performed: RIGHT TOTAL KNEE ARTHROPLASTY (Right Knee)     Patient location during evaluation: PACU Anesthesia Type: Regional and Spinal Level of consciousness: awake and alert Pain management: pain level controlled Vital Signs Assessment: post-procedure vital signs reviewed and stable Respiratory status: spontaneous breathing and respiratory function stable Cardiovascular status: blood pressure returned to baseline and stable Postop Assessment: spinal receding Anesthetic complications: no   No complications documented.  Last Vitals:  Vitals:   05/09/20 1051 05/09/20 1109  BP: (!) 159/77 128/83  Pulse: 73 68  Resp: 18 16  Temp: 36.7 C 36.6 C  SpO2: 97% 98%    Last Pain:  Vitals:   05/09/20 1051  TempSrc:   PainSc: 0-No pain                 Merlinda Frederick

## 2020-05-09 NOTE — Anesthesia Procedure Notes (Signed)
Spinal  Patient location during procedure: OR Start time: 05/09/2020 7:24 AM End time: 05/09/2020 7:28 AM Staffing Performed: resident/CRNA  Resident/CRNA: Sharlette Dense, CRNA Preanesthetic Checklist Completed: patient identified, IV checked, site marked, risks and benefits discussed, surgical consent, monitors and equipment checked, pre-op evaluation and timeout performed Spinal Block Patient position: sitting Prep: DuraPrep and site prepped and draped Patient monitoring: heart rate, continuous pulse ox and blood pressure Approach: midline Location: L3-4 Injection technique: single-shot Needle Needle type: Sprotte  Needle gauge: 24 G Needle length: 9 cm Additional Notes Kit expiration date 11/11/2020 and lot #[id number] Clear free flow CSF, negative heme, negative paresthesia Tolerated well and returned to supine position

## 2020-05-09 NOTE — H&P (Signed)
The recent History & Physical has been reviewed. I have personally examined the patient today. There is no interval change to the documented History & Physical. The patient would like to proceed with the procedure.  Garald Balding 05/09/2020,  7:13 AM

## 2020-05-09 NOTE — Transfer of Care (Signed)
Immediate Anesthesia Transfer of Care Note  Patient: Charles Trujillo  Procedure(s) Performed: RIGHT TOTAL KNEE ARTHROPLASTY (Right Knee)  Patient Location: PACU  Anesthesia Type:Spinal  Level of Consciousness: awake and oriented  Airway & Oxygen Therapy: Patient Spontanous Breathing and Patient connected to face mask oxygen  Post-op Assessment: Report given to RN and Post -op Vital signs reviewed and stable  Post vital signs: Reviewed and stable  Last Vitals:  Vitals Value Taken Time  BP 118/80 05/09/20 0957  Temp    Pulse 74 05/09/20 0958  Resp 11 05/09/20 0958  SpO2 97 % 05/09/20 0958  Vitals shown include unvalidated device data.  Last Pain:  Vitals:   05/09/20 0605  TempSrc: Oral      Patients Stated Pain Goal: 4 (29/56/21 3086)  Complications: No complications documented.

## 2020-05-09 NOTE — Progress Notes (Signed)
Orthopedic Tech Progress Note Patient Details:  Charles Trujillo 1951-06-01 [id number]  CPM Right Knee CPM Right Knee: Off Right Knee Flexion (Degrees): 90 Right Knee Extension (Degrees): 0  Post Interventions Patient Tolerated: Well Instructions Provided: Care of device  [name] 05/09/2020, 4:22 PM

## 2020-05-09 NOTE — Anesthesia Procedure Notes (Signed)
Anesthesia Regional Block: Adductor canal block   Pre-Anesthetic Checklist: ,, timeout performed, Correct Patient, Correct Site, Correct Laterality, Correct Procedure, Correct Position, site marked, Risks and benefits discussed,  Surgical consent,  Pre-op evaluation,  At surgeon's request and post-op pain management  Laterality: Right  Prep: Dura Prep       Needles:  Injection technique: Single-shot  Needle Type: Echogenic Stimulator Needle     Needle Length: 10cm  Needle Gauge: 20     Additional Needles:   Procedures:,,,, ultrasound used (permanent image in chart),,,,  Narrative:  Start time: 05/09/2020 6:50 AM End time: 05/09/2020 6:55 AM Injection made incrementally with aspirations every 5 mL.  Performed by: Personally  Anesthesiologist: Merlinda Frederick, MD  Additional Notes: Functioning IV was confirmed and monitors were applied.   Sterile prep and drape,hand hygiene and sterile gloves were used. Ultrasound guidance: relevant anatomy identified, needle position confirmed, local anesthetic spread visualized around nerve(s)., vascular puncture avoided.  Image printed for medical record. Negative aspiration and negative test dose prior to incremental administration of local anesthetic. The patient tolerated the procedure well.

## 2020-05-09 NOTE — Evaluation (Signed)
Physical Therapy Evaluation Patient Details Name: Charles Trujillo MRN: [id number] DOB: [birth year] Today's Date: 05/09/2020   History of Present Illness  69 yo male s/p R TKA 05/09/20. Hx of BPPV, L TKA 2020, DM.  Clinical Impression  On eval POD 0, pt was Min assist for mobility. He walked ~75 feet with a RW. Moderate pain with activity. Anticipate pt will progress well during hospital stay. Plan is for possible d/c home on tomorrow if all continues to go well.     Follow Up Recommendations Follow surgeon's recommendation for DC plan and follow-up therapies    Equipment Recommendations  None recommended by PT    Recommendations for Other Services       Precautions / Restrictions Precautions Precautions: Fall;Knee Restrictions Weight Bearing Restrictions: No Other Position/Activity Restrictions: WBAT      Mobility  Bed Mobility Overal bed mobility: Needs Assistance Bed Mobility: Supine to Sit     Supine to sit: Min guard;HOB elevated          Transfers Overall transfer level: Needs assistance Equipment used: Rolling walker (2 wheeled) Transfers: Sit to/from Stand Sit to Stand: Min assist;From elevated surface         General transfer comment: VCs safety, technique, hand placement. Assist to power up, stabilize  Ambulation/Gait Ambulation/Gait assistance: Min assist Gait Distance (Feet): 75 Feet Assistive device: Rolling walker (2 wheeled) Gait Pattern/deviations: Step-to pattern     General Gait Details: VCs safety, technique, sequence. Assist to steady intermittently.  Stairs            Wheelchair Mobility    Modified Rankin (Stroke Patients Only)       Balance Overall balance assessment: Needs assistance         Standing balance support: Bilateral upper extremity supported Standing balance-Leahy Scale: Poor                               Pertinent Vitals/Pain Pain Assessment: 0-10 Pain Score: 5  Pain Location: R  knee Pain Descriptors / Indicators: Discomfort;Sore;Aching Pain Intervention(s): Monitored during session;Ice applied    Home Living Family/patient expects to be discharged to:: Private residence Living Arrangements: Spouse/significant other Available Help at Discharge: Family;Available 24 hours/day Type of Home: House Home Access: Stairs to enter   [employer] of Steps: 1 Home Layout: One level Home Equipment: Walker - 2 wheels;Bedside commode      Prior Function Level of Independence: Independent               Hand Dominance        Extremity/Trunk Assessment   Upper Extremity Assessment Upper Extremity Assessment: Overall WFL for tasks assessed    Lower Extremity Assessment Lower Extremity Assessment:  (post op knee weakness s/p TKA)    Cervical / Trunk Assessment Cervical / Trunk Assessment: Normal  Communication   Communication: No difficulties  Cognition Arousal/Alertness: Awake/alert Behavior During Therapy: WFL for tasks assessed/performed Overall Cognitive Status: Within Functional Limits for tasks assessed                                        General Comments      Exercises     Assessment/Plan    PT Assessment Patient needs continued PT services  PT Problem List Decreased strength;Decreased range of motion;Decreased mobility;Decreased activity tolerance;Decreased balance;Decreased knowledge of use of DME;Pain  PT Treatment Interventions DME instruction;Gait training;Therapeutic activities;Therapeutic exercise;Patient/family education;Balance training;Functional mobility training    PT Goals (Current goals can be found in the Care Plan section)  Acute Rehab PT Goals Patient Stated Goal: regain independence + PLOF PT Goal Formulation: With patient Time For Goal Achievement: 05/23/20 Potential to Achieve Goals: Good    Frequency 7X/week   Barriers to discharge        Co-evaluation                AM-PAC PT "6 Clicks" Mobility  Outcome Measure Help needed turning from your back to your side while in a flat bed without using bedrails?: A Little Help needed moving from lying on your back to sitting on the side of a flat bed without using bedrails?: A Little Help needed moving to and from a bed to a chair (including a wheelchair)?: A Little Help needed standing up from a chair using your arms (e.g., wheelchair or bedside chair)?: A Little Help needed to walk in hospital room?: A Little Help needed climbing 3-5 steps with a railing? : A Little 6 Click Score: 18    End of Session Equipment Utilized During Treatment: Gait belt Activity Tolerance: Patient tolerated treatment well Patient left: in bed;with call bell/phone within reach;with family/visitor present   PT Visit Diagnosis: Pain;Other abnormalities of gait and mobility (R26.89) Pain - Right/Left: Right Pain - part of body: Knee    Time: 1525-1550 PT Time Calculation (min) (ACUTE ONLY): 25 min   Charges:   PT Evaluation $PT Eval Low Complexity: 1 Low PT Treatments $Gait Training: 8-22 mins          Doreatha Massed, PT Acute Rehabilitation  Office: [phone] Pager: [phone]

## 2020-05-09 NOTE — Op Note (Signed)
NAME: SHENANDOAH, VANDERGRIFF MEDICAL RECORD [id number] ACCOUNT [id number] DATE OF BIRTH:October 04, 1950 FACILITY: [facility] LOCATION: [facility] PHYSICIAN:Coran Dipaola Sharlotte Alamo, MD  OPERATIVE REPORT  DATE OF PROCEDURE:  05/09/2020  PREOPERATIVE DIAGNOSIS:  End-stage osteoarthritis, right knee.  POSTOPERATIVE DIAGNOSIS:  End-stage osteoarthritis, right knee.  PROCEDURE:  Right total knee replacement.  SURGEON:  [name], MD  ASSISTANT:  [name], PA-C, was present throughout the operative procedure to ensure its timely completion.  ANESTHESIA:  Adductor canal block, spinal and IV sedation.  COMPLICATIONS:  None.  COMPONENTS:  DePuy LCS standard plus femoral component, #5 rotating keeled tibial tray with a 12.5 mm polyethylene bridging bearing, a metal-backed 3-peg rotating patella.  Components were secured with polymethyl methacrylate.  PROCEDURE:  The patient was met in the holding area and identified the right knee as the appropriate operative site and marked it accordingly.  Anesthesia performed an adductor canal block.  The patient was then transported to room #5.  Anesthesia  performed spinal anesthetic without difficulty.  He was carefully placed on the operating table.  Nursing staff inserted a Foley catheter.  Urine was clear.  The right lower extremity was then placed in a thigh tourniquet.  The leg was prepped with chlorhexidine scrub and DuraPrep x2 from the tourniquet to the tips of the toes.  Sterile draping was performed.  A timeout was called.  The right lower extremity was then elevated and Esmarch exsanguinated with a proximal tourniquet at 325 mmHg.  A midline longitudinal incision was then made extending from the superior pouch to the tibial tubercle.  Via sharp dissection, incision was carried down to subcutaneous tissue.  First layer of capsule was incised in the midline.  A medial parapatellar  incision was then made with the Bovie.  The joint was then  entered.  There was about a 20 mL clear yellow joint effusion.  Patella was everted 180 degrees laterally, the knee flexed to 90 degrees.  There was a fixed varus position.  I did a subperiosteal release at the proximal tibia, which then allowed me to align the knee to neutral.  There was abundant beefy red  synovitis.  A synovectomy was performed.  There was complete absence of articular cartilage on the medial femoral condyle and medial tibial plateau with large osteophytes both medially and laterally.  The osteophytes were removed.  I measured a standard plus femoral component.  The first bony cut was then made transversely in the proximal tibia with a 7 degree angle of declination.  After each bony cut on the tibia and femur, I checked my alignment with the external guides.  Subsequent cuts were then made on the femur with the standard plus tibial jigs.  Laminar spreader was then placed along the medial and lateral compartments.  I removed medial and lateral menisci.  There was significant degeneration of the medial  meniscus.  There was also degeneration of the ACL and PCL.  I did perform an ACL and PCL resection and removed any osteophytes from the posterior femoral condyle with a 3/4 inch curved osteotome.  I measured my flexion and extension gaps and they  appeared to be symmetrical at 12.5.  Subsequent cuts were then made on the distal femur using 4 degrees of valgus.  Again, flexion and extension gaps were symmetrical at 12.5.  MCL and LCL were intact.  Final cuts were then made on the femur to obtain tapering cuts in the center holes.  Retractors were then placed about the tibia, was advanced  anteriorly.  I measured a #5 tibial tray.  This was pinned in place.  The center hole was then made followed by the keeled cut.  With the tibial jig in place, the 12.5 mm bridging bearing was then  applied followed by the trial standard plus femoral component.  The entire construct was then reduced  and through a full range of motion, knee was perfectly stable.  We had full extension, whereas preoperatively, there was a flexion contracture.  Patella was prepared by removing approximately 10.5 mm of bone, leaving 12.5 mm patella thickness.  Patellar jig was applied, three holes made and the trial patella was then inserted.  This was reduced and through a full range of motion remained stable.  The trial components were then removed.  The joint was copiously irrigated with saline solution.  The final components were then impacted with polymethyl methacrylate.  We initially applied the tibial tray followed by the 12.5 mm polyethylene bridging bearing and then the standard plus femoral component.  The knee was placed in full extension and any  extraneous methacrylate was removed from the periphery of the components.  Patella was applied with methacrylate and a patellar clamp.  At approximately 16 minutes, methacrylate had matured.  During this time, we irrigated the joint and then infiltrated the deep capsule with 0.25% Marcaine with epinephrine.  Any remaining synovitis was removed with the Bovie.  At that point, the joint was checked.  There did not appear to be any further extraneous methacrylate.  Tourniquet was released.  There was immediate capillary refill to the joint.  Gross bleeders were Bovie coagulated.  The patient did receive a gram of  TXA preoperatively.  I thought that the joint was nice and dry.  Deep capsule was then closed with a running 0 Ethibond suture.  The superficial capsule with a running 2-0 Vicryl, the subcutaneous with 3-0 Monocryl and the skin closed with skin clips.  Sterile bulky dressing was applied followed by an Ace bandage.  The patient tolerated the procedure without complications.  IN/NUANCE  D:05/09/2020 T:05/09/2020 JOB:[id number]

## 2020-05-09 NOTE — Op Note (Signed)
PATIENT ID:      Charles Trujillo  MRN:     [id number] DOB/AGE:    71/22/1952 / 71 y.o.       OPERATIVE REPORT    DATE OF PROCEDURE:  05/09/2020       PREOPERATIVE DIAGNOSIS: end stage  right knee osteoarthritis                                                       Estimated body mass index is 31.67 kg/m as calculated from the following:   Height as of this encounter: 5\' 8"  (1.727 m).   Weight as of this encounter: 94.5 kg.     POSTOPERATIVE DIAGNOSIS: end stage  right knee osteoarthritis                                                                     Estimated body mass index is 31.67 kg/m as calculated from the following:   Height as of this encounter: 5\' 8"  (1.727 m).   Weight as of this encounter: 94.5 kg.     PROCEDURE:  Procedure(s): RIGHT TOTAL KNEE ARTHROPLASTY     SURGEON:  [name], MD    ASSISTANT:   [name], PA-C   (Present and scrubbed throughout the case, critical for assistance with exposure, retraction, instrumentation, and closure.)          ANESTHESIA: regional, spinal and IV sedation     DRAINS: none :      TOURNIQUET TIME:  Total Tourniquet Time Documented: Thigh (Right) - 84 minutes Total: Thigh (Right) - 84 minutes     COMPLICATIONS:  None   CONDITION:  stable  PROCEDURE IN [place] 05/09/2020, 9:41 AM

## 2020-05-09 NOTE — Progress Notes (Signed)
Orthopedic Tech Progress Note Patient Details:  Charles Trujillo [birth year] [id number]  CPM Right Knee CPM Right Knee: On Right Knee Flexion (Degrees): 90 Right Knee Extension (Degrees): 0  Post Interventions Patient Tolerated: Well Instructions Provided: Care of device  Charles Trujillo 05/09/2020, 12:04 PM

## 2020-05-10 ENCOUNTER — Encounter (HOSPITAL_COMMUNITY): Payer: Self-pay | Admitting: Orthopaedic Surgery

## 2020-05-10 ENCOUNTER — Other Ambulatory Visit: Payer: Self-pay | Admitting: Orthopedic Surgery

## 2020-05-10 ENCOUNTER — Telehealth: Payer: Self-pay | Admitting: Orthopaedic Surgery

## 2020-05-10 DIAGNOSIS — M1711 Unilateral primary osteoarthritis, right knee: Secondary | ICD-10-CM | POA: Diagnosis not present

## 2020-05-10 LAB — CBC
HCT: 37.3 % — ABNORMAL LOW (ref 39.0–52.0)
Hemoglobin: 12.6 g/dL — ABNORMAL LOW (ref 13.0–17.0)
MCH: 31.6 pg (ref 26.0–34.0)
MCHC: 33.8 g/dL (ref 30.0–36.0)
MCV: 93.5 fL (ref 80.0–100.0)
Platelets: 179 10*3/uL (ref 150–400)
RBC: 3.99 MIL/uL — ABNORMAL LOW (ref 4.22–5.81)
RDW: 12.6 % (ref 11.5–15.5)
WBC: 16.2 10*3/uL — ABNORMAL HIGH (ref 4.0–10.5)
nRBC: 0 % (ref 0.0–0.2)

## 2020-05-10 LAB — BASIC METABOLIC PANEL
Anion gap: 7 (ref 5–15)
BUN: 20 mg/dL (ref 8–23)
CO2: 24 mmol/L (ref 22–32)
Calcium: 8.5 mg/dL — ABNORMAL LOW (ref 8.9–10.3)
Chloride: 103 mmol/L (ref 98–111)
Creatinine, Ser: 1.13 mg/dL (ref 0.61–1.24)
GFR, Estimated: >60 mL/min (ref >60)
Glucose, Bld: 263 mg/dL — ABNORMAL HIGH (ref 70–99)
Potassium: 4.7 mmol/L (ref 3.5–5.1)
Sodium: 134 mmol/L — ABNORMAL LOW (ref 135–145)

## 2020-05-10 LAB — GLUCOSE, CAPILLARY
Glucose-Capillary: 243 mg/dL — ABNORMAL HIGH (ref 70–99)
Glucose-Capillary: 172 mg/dL — ABNORMAL HIGH (ref 70–99)

## 2020-05-10 LAB — HEMOGLOBIN A1C
Hgb A1c MFr Bld: 6.6 % — ABNORMAL HIGH (ref 4.8–5.6)
Mean Plasma Glucose: 143 mg/dL

## 2020-05-10 MED ORDER — HYDROMORPHONE HCL 2 MG PO TABS: 2.0000 mg | ORAL_TABLET | Freq: Four times a day (QID) | ORAL | 0 refills | Status: DC | PRN

## 2020-05-10 MED ORDER — HYDROMORPHONE HCL 2 MG PO TABS
2.0000 mg | ORAL_TABLET | Freq: Four times a day (QID) | ORAL | 0 refills | Status: DC | PRN
Start: 2020-05-10 — End: 2020-05-10

## 2020-05-10 MED ORDER — ASPIRIN 81 MG PO CHEW: 81.0000 mg | CHEWABLE_TABLET | Freq: Two times a day (BID) | ORAL | Status: DC

## 2020-05-10 MED ORDER — CHLORHEXIDINE GLUCONATE CLOTH 2 % EX PADS: 6.0000 | MEDICATED_PAD | Freq: Every day | CUTANEOUS | Status: DC

## 2020-05-10 MED ORDER — METHOCARBAMOL 500 MG PO TABS: 500.0000 mg | ORAL_TABLET | Freq: Three times a day (TID) | ORAL | 0 refills | Status: DC | PRN

## 2020-05-10 MED ORDER — METHOCARBAMOL 500 MG PO TABS: 500.0000 mg | ORAL_TABLET | Freq: Three times a day (TID) | ORAL | Status: DC | PRN

## 2020-05-10 MED ORDER — HYDROMORPHONE HCL 2 MG PO TABS: 2.0000 mg | ORAL_TABLET | ORAL | Status: DC | PRN

## 2020-05-10 MED FILL — HYDROmorphone HCL 2 MG TABS: 2 | 10 days supply | Qty: 50 | Fill #0

## 2020-05-10 MED FILL — METHOCARBAMOL 500 MG TABS: 500 | 10 days supply | Qty: 30 | Fill #0

## 2020-05-10 NOTE — Discharge Summary (Addendum)
[name], MD   [name], PA-C [address] [place], [place]  [address]                             [phone]  PATIENT ID: Charles Trujillo        MRN:  [id number]          DOB/AGE: 1950/12/06 / 69 y.o.    DISCHARGE SUMMARY  ADMISSION DATE:    05/09/2020 DISCHARGE DATE:   05/10/2020   ADMISSION DIAGNOSIS:   Osteoarthritis right knee    DISCHARGE DIAGNOSIS:   Osteoarthritis right knee     ADDITIONAL DIAGNOSIS: Active Problems:   S/P TKR (total knee replacement) using cement, right  Past Medical History:  Diagnosis Date  . Anxiety   . Arthritis    bilateral knees  . Depression   . DM2 (diabetes mellitus, type 2) ([place])   . Dysrhythmia     benign tachycardia, takes [place]  . Elevated LFTs    previously evaluated by Dr Sharlett Iles; h/o "fatty liver"  . GERD (gastroesophageal reflux disease)   . HLD (hyperlipidemia)   . Hypertension   . Inguinal hernia    right  . Squamous cell carcinoma    top of head    PROCEDURE: Procedure(s): RIGHT TOTAL KNEE ARTHROPLASTY  on 05/09/2020  CONSULTS: none    HISTORY:Rico O Dirk, 69 y.o. male, has a history of pain and functional disability in the right knee due to arthritis and has failed non-surgical conservative treatments for greater than 12 weeks to includeNSAID's and/or analgesics, corticosteriod injections, viscosupplementation injections, flexibility and strengthening excercises, weight reduction as appropriate and activity modification.  Onset of symptoms was gradual, starting 10 years ago with gradually worsening course since that time. The patient noted prior procedures on the knee to include  menisectomy on the right knee(s).  Patient currently rates pain in the right knee(s) at 6 out of 10 with activity. Patient has night pain, worsening of pain with activity and weight bearing, pain that interferes with activities of daily living, pain with passive range of motion, crepitus and joint swelling.  Patient  has evidence of subchondral cysts, subchondral sclerosis, periarticular osteophytes, joint subluxation and joint space narrowing by imaging studies.  There is no active infection.   HOSPITAL COURSE:  SEVAG SHEARN is a 69 y.o. admitted on 05/09/2020 and found to have a diagnosis of right knee osteoarthritis.  After appropriate laboratory studies were obtained  they were taken to the operating room on 05/09/2020 and underwent  Procedure(s): RIGHT TOTAL KNEE ARTHROPLASTY  Right.   They were given perioperative antibiotics:  Anti-infectives (From admission, onward)   Start     Dose/Rate Route Frequency Ordered Stop   05/09/20 1330  ceFAZolin (ANCEF) IVPB 2g/100 mL premix        2 g 200 mL/hr over 30 Minutes Intravenous Every 6 hours 05/09/20 1110 05/09/20 2103   05/09/20 0600  ceFAZolin (ANCEF) IVPB 2g/100 mL premix        2 g 200 mL/hr over 30 Minutes Intravenous On call to O.R. 05/09/20 0551 05/09/20 0740   05/09/20 0555  ceFAZolin (ANCEF) 2-4 GM/100ML-% IVPB       Note to Pharmacy: Randa Evens  : cabinet override      05/09/20 0555 05/09/20 0735    .  Tolerated the procedure well.  Placed with a foley intraoperatively.     POD #1, allowed out of bed to  a chair.  PT for ambulation and exercise program.  Foley D/C'd in morning.  IV saline locked.  O2 discontionued.  The remainder of the hospital course was dedicated to ambulation and strengthening.   The patient was discharged on 1 Day Post-Op in  Stable condition.  Blood products given:none  DIAGNOSTIC STUDIES: Recent vital signs:  Patient Vitals for the past 24 hrs:  BP Temp Temp src Pulse Resp SpO2  05/10/20 0959 126/76 (!) 97.5 F (36.4 C) Oral 84 17 97 %  05/10/20 0551 (!) 129/95 (!) 97.5 F (36.4 C) Oral 82 17 98 %  05/10/20 0219 118/79 97.6 F (36.4 C) Oral 95 19 98 %  05/09/20 2143 118/70 97.9 F (36.6 C) Oral 96 17 97 %  05/09/20 1821 (!) 148/97 97.8 F (36.6 C) -- 95 16 96 %  05/09/20 1402 136/90 98.1 F  (36.7 C) -- 76 16 98 %  05/09/20 1313 (!) 137/91 97.9 F (36.6 C) -- 76 16 98 %       Recent laboratory studies: Recent Labs    05/10/20 0313  WBC 16.2*  HGB 12.6*  HCT 37.3*  PLT 179   Recent Labs    05/10/20 0313  NA 134*  K 4.7  CL 103  CO2 24  BUN 20  CREATININE 1.13  GLUCOSE 263*  CALCIUM 8.5*   Lab Results  Component Value Date   INR 1.2 05/01/2020   INR 1.1 04/30/2019     Recent Radiographic Studies :  DG Chest 2 View  Result Date: 05/01/2020 CLINICAL DATA:  Preop evaluation. EXAM: CHEST - 2 VIEW COMPARISON:  04/30/2019 chest radiograph and prior. FINDINGS: Minimal left basilar atelectasis. Clear right lung. No pneumothorax or pleural effusion. Cardiomediastinal silhouette within normal limits. Osseous structures are unremarkable. IMPRESSION: No focal airspace disease.  Left basilar atelectasis. Electronically Signed   By: Primitivo Gauze M.D.   On: 05/01/2020 14:33    DISCHARGE INSTRUCTIONS: Discharge Instructions    CPM   Complete by: As directed    Continuous passive motion machine (CPM):      Use the CPM from 0 to 60 for 6-8 hours per day.      You may increase by 5-10 degrees per day.  You may break it up into 2 or 3 sessions per day.      Use CPM for 3-4  weeks or until you are told to stop.   CPM   Complete by: As directed    Continuous passive motion machine (CPM):      Use the CPM from 0 to 60 for 6-8 hours per day.      You may increase by 5-10 degrees per day.  You may break it up into 2 or 3 sessions per day.      Use CPM for 3-4  weeks or until you are told to stop.   CPM   Complete by: As directed    Continuous passive motion machine (CPM):      Use the CPM from 0 to 60 for 6-8 hours per day.      You may increase by 5-10 degrees per day.  You may break it up into 2 or 3 sessions per day.      Use CPM for 3-4  weeks or until you are told to stop.   Call MD / Call 911   Complete by: As directed    If you experience chest pain or  shortness of breath, CALL 911 and  be transported to the hospital emergency room.  If you develope a fever above 101 F, pus (white drainage) or increased drainage or redness at the wound, or calf pain, call your surgeon's office.   Call MD / Call 911   Complete by: As directed    If you experience chest pain or shortness of breath, CALL 911 and be transported to the hospital emergency room.  If you develope a fever above 101 F, pus (white drainage) or increased drainage or redness at the wound, or calf pain, call your surgeon's office.   Call MD / Call 911   Complete by: As directed    If you experience chest pain or shortness of breath, CALL 911 and be transported to the hospital emergency room.  If you develope a fever above 101 F, pus (white drainage) or increased drainage or redness at the wound, or calf pain, call your surgeon's office.   Change dressing   Complete by: As directed    DO NOT CHANGE YOUR DRESSING   Change dressing   Complete by: As directed    DO NOT CHANGE YOUR DRESSING   Change dressing   Complete by: As directed    DO NOT CHANGE YOUR DRESSING   Constipation Prevention   Complete by: As directed    Drink plenty of fluids.  Prune juice may be helpful.  You may use a stool softener, such as Colace (over the counter) 100 mg twice a day.  Use MiraLax (over the counter) for constipation as needed.   Constipation Prevention   Complete by: As directed    Drink plenty of fluids.  Prune juice may be helpful.  You may use a stool softener, such as Colace (over the counter) 100 mg twice a day.  Use MiraLax (over the counter) for constipation as needed.   Constipation Prevention   Complete by: As directed    Drink plenty of fluids.  Prune juice may be helpful.  You may use a stool softener, such as Colace (over the counter) 100 mg twice a day.  Use MiraLax (over the counter) for constipation as needed.   Diet Carb Modified   Complete by: As directed    Discharge instructions    Complete by: As directed    INSTRUCTIONS AFTER JOINT REPLACEMENT   Remove items at home which could result in a fall. This includes throw rugs or furniture in walking pathways ICE to the affected joint every three hours while awake for 30 minutes at a time, for at least the first 3-5 days, and then as needed for pain and swelling.  Continue to use ice for pain and swelling. You may notice swelling that will progress down to the foot and ankle.  This is normal after surgery.  Elevate your leg when you are not up walking on it.   Continue to use the breathing machine you got in the hospital (incentive spirometer) which will help keep your temperature down.  It is common for your temperature to cycle up and down following surgery, especially at night when you are not up moving around and exerting yourself.  The breathing machine keeps your lungs expanded and your temperature down.   DIET:  As you were doing prior to hospitalization, we recommend a well-balanced diet.  DRESSING / WOUND CARE / SHOWERING  Keep the surgical dressing until follow up.  The dressing is water proof, so you can shower without any extra covering.  IF THE DRESSING FALLS OFF or  the wound gets wet inside, change the dressing with sterile gauze.  Please use good hand washing techniques before changing the dressing.  Do not use any lotions or creams on the incision until instructed by your surgeon.    ACTIVITY  Increase activity slowly as tolerated, but follow the weight bearing instructions below.   No driving for 6 weeks or until further direction given by your physician.  You cannot drive while taking narcotics.  No lifting or carrying greater than 10 lbs. until further directed by your surgeon. Avoid periods of inactivity such as sitting longer than an hour when not asleep. This helps prevent blood clots.  You may return to work once you are authorized by your doctor.     WEIGHT BEARING   Partial weight bearing with  assist device as directed.  50%   EXERCISES  Results after joint replacement surgery are often greatly improved when you follow the exercise, range of motion and muscle strengthening exercises prescribed by your doctor. Safety measures are also important to protect the joint from further injury. Any time any of these exercises cause you to have increased pain or swelling, decrease what you are doing until you are comfortable again and then slowly increase them. If you have problems or questions, call your caregiver or physical therapist for advice.   Rehabilitation is important following a joint replacement. After just a few days of immobilization, the muscles of the leg can become weakened and shrink (atrophy).  These exercises are designed to build up the tone and strength of the thigh and leg muscles and to improve motion. Often times heat used for twenty to thirty minutes before working out will loosen up your tissues and help with improving the range of motion but do not use heat for the first two weeks following surgery (sometimes heat can increase post-operative swelling).   These exercises can be done on a training (exercise) mat, on the floor, on a table or on a bed. Use whatever works the best and is most comfortable for you.    Use music or television while you are exercising so that the exercises are a pleasant break in your day. This will make your life better with the exercises acting as a break in your routine that you can look forward to.   Perform all exercises about fifteen times, three times per day or as directed.  You should exercise both the operative leg and the other leg as well.   Exercises include:  Quad Sets - Tighten up the muscle on the front of the thigh (Quad) and hold for 5-10 seconds.   Straight Leg Raises - With your knee straight (if you were given a brace, keep it on), lift the leg to 60 degrees, hold for 3 seconds, and slowly lower the leg.  Perform this exercise  against resistance later as your leg gets stronger.  Leg Slides: Lying on your back, slowly slide your foot toward your buttocks, bending your knee up off the floor (only go as far as is comfortable). Then slowly slide your foot back down until your leg is flat on the floor again.  Angel Wings: Lying on your back spread your legs to the side as far apart as you can without causing discomfort.  Hamstring Strength:  Lying on your back, push your heel against the floor with your leg straight by tightening up the muscles of your buttocks.  Repeat, but this time bend your knee to a comfortable angle,  and push your heel against the floor.  You may put a pillow under the heel to make it more comfortable if necessary.   A rehabilitation program following joint replacement surgery can speed recovery and prevent re-injury in the future due to weakened muscles. Contact your doctor or a physical therapist for more information on knee rehabilitation.    CONSTIPATION  Constipation is defined medically as fewer than three stools per week and severe constipation as less than one stool per week.  Even if you have a regular bowel pattern at home, your normal regimen is likely to be disrupted due to multiple reasons following surgery.  Combination of anesthesia, postoperative narcotics, change in appetite and fluid intake all can affect your bowels.   YOU MUST use at least one of the following options; they are listed in order of increasing strength to get the job done.  They are all available over the counter, and you may need to use some, POSSIBLY even all of these options:    Drink plenty of fluids (prune juice may be helpful) and high fiber foods Colace 100 mg by mouth twice a day  Senokot for constipation as directed and as needed Dulcolax (bisacodyl), take with full glass of water  Miralax (polyethylene glycol) once or twice a day as needed.  If you have tried all these things and are unable to have a bowel  movement in the first 3-4 days after surgery call either your surgeon or your primary doctor.    If you experience loose stools or diarrhea, hold the medications until you stool forms back up.  If your symptoms do not get better within 1 week or if they get worse, check with your doctor.  If you experience "the worst abdominal pain ever" or develop nausea or vomiting, please contact the office immediately for further recommendations for treatment.   ITCHING:  If you experience itching with your medications, try taking only a single pain pill, or even half a pain pill at a time.  You can also use Benadryl over the counter for itching or also to help with sleep.   TED HOSE STOCKINGS:  Use stockings on both legs until for at least 2 weeks or as directed by physician office. They may be removed at night for sleeping.  MEDICATIONS:  See your medication summary on the "After Visit Summary" that nursing will review with you.  You may have some home medications which will be placed on hold until you complete the course of blood thinner medication.  It is important for you to complete the blood thinner medication as prescribed.  PRECAUTIONS:  If you experience chest pain or shortness of breath - call 911 immediately for transfer to the hospital emergency department.   If you develop a fever greater that 101 F, purulent drainage from wound, increased redness or drainage from wound, foul odor from the wound/dressing, or calf pain - CONTACT YOUR SURGEON.                                                   FOLLOW-UP APPOINTMENTS:  If you do not already have a post-op appointment, please call the office for an appointment to be seen by your surgeon.  Guidelines for how soon to be seen are listed in your "After Visit Summary", but are typically between 1-4 weeks  after surgery.  OTHER INSTRUCTIONS:   Knee Replacement:  Do not place pillow under knee, focus on keeping the knee straight while resting. CPM  instructions: 0-90 degrees, 2 hours in the morning, 2 hours in the afternoon, and 2 hours in the evening. Place foam block, curve side up under heel at all times except when in CPM or when walking.  DO NOT modify, tear, cut, or change the foam block in any way.  MAKE SURE YOU:  Understand these instructions.  Get help right away if you are not doing well or get worse.    Thank you for letting us be a part of your medical care team.  It is a privilege we respect greatly.  We hope these instructions will help you stay on track for a fast and full recovery!   Discharge instructions   Complete by: As directed    INSTRUCTIONS AFTER JOINT REPLACEMENT   Remove items at home which could result in a fall. This includes throw rugs or furniture in walking pathways ICE to the affected joint every three hours while awake for 30 minutes at a time, for at least the first 3-5 days, and then as needed for pain and swelling.  Continue to use ice for pain and swelling. You may notice swelling that will progress down to the foot and ankle.  This is normal after surgery.  Elevate your leg when you are not up walking on it.   Continue to use the breathing machine you got in the hospital (incentive spirometer) which will help keep your temperature down.  It is common for your temperature to cycle up and down following surgery, especially at night when you are not up moving around and exerting yourself.  The breathing machine keeps your lungs expanded and your temperature down.   DIET:  As you were doing prior to hospitalization, we recommend a well-balanced diet.  DRESSING / WOUND CARE / SHOWERING  Keep the surgical dressing until follow up.  The dressing is water proof, so you can shower without any extra covering.  IF THE DRESSING FALLS OFF or the wound gets wet inside, change the dressing with sterile gauze.  Please use good hand washing techniques before changing the dressing.  Do not use any lotions or creams on  the incision until instructed by your surgeon.    ACTIVITY  Increase activity slowly as tolerated, but follow the weight bearing instructions below.   No driving for 6 weeks or until further direction given by your physician.  You cannot drive while taking narcotics.  No lifting or carrying greater than 10 lbs. until further directed by your surgeon. Avoid periods of inactivity such as sitting longer than an hour when not asleep. This helps prevent blood clots.  You may return to work once you are authorized by your doctor.     WEIGHT BEARING   Partial weight bearing with assist device as directed.  50%   EXERCISES  Results after joint replacement surgery are often greatly improved when you follow the exercise, range of motion and muscle strengthening exercises prescribed by your doctor. Safety measures are also important to protect the joint from further injury. Any time any of these exercises cause you to have increased pain or swelling, decrease what you are doing until you are comfortable again and then slowly increase them. If you have problems or questions, call your caregiver or physical therapist for advice.   Rehabilitation is important following a joint replacement. After just  a few days of immobilization, the muscles of the leg can become weakened and shrink (atrophy).  These exercises are designed to build up the tone and strength of the thigh and leg muscles and to improve motion. Often times heat used for twenty to thirty minutes before working out will loosen up your tissues and help with improving the range of motion but do not use heat for the first two weeks following surgery (sometimes heat can increase post-operative swelling).   These exercises can be done on a training (exercise) mat, on the floor, on a table or on a bed. Use whatever works the best and is most comfortable for you.    Use music or television while you are exercising so that the exercises are a pleasant  break in your day. This will make your life better with the exercises acting as a break in your routine that you can look forward to.   Perform all exercises about fifteen times, three times per day or as directed.  You should exercise both the operative leg and the other leg as well.   Exercises include:  Quad Sets - Tighten up the muscle on the front of the thigh (Quad) and hold for 5-10 seconds.   Straight Leg Raises - With your knee straight (if you were given a brace, keep it on), lift the leg to 60 degrees, hold for 3 seconds, and slowly lower the leg.  Perform this exercise against resistance later as your leg gets stronger.  Leg Slides: Lying on your back, slowly slide your foot toward your buttocks, bending your knee up off the floor (only go as far as is comfortable). Then slowly slide your foot back down until your leg is flat on the floor again.  Angel Wings: Lying on your back spread your legs to the side as far apart as you can without causing discomfort.  Hamstring Strength:  Lying on your back, push your heel against the floor with your leg straight by tightening up the muscles of your buttocks.  Repeat, but this time bend your knee to a comfortable angle, and push your heel against the floor.  You may put a pillow under the heel to make it more comfortable if necessary.   A rehabilitation program following joint replacement surgery can speed recovery and prevent re-injury in the future due to weakened muscles. Contact your doctor or a physical therapist for more information on knee rehabilitation.    CONSTIPATION  Constipation is defined medically as fewer than three stools per week and severe constipation as less than one stool per week.  Even if you have a regular bowel pattern at home, your normal regimen is likely to be disrupted due to multiple reasons following surgery.  Combination of anesthesia, postoperative narcotics, change in appetite and fluid intake all can affect your  bowels.   YOU MUST use at least one of the following options; they are listed in order of increasing strength to get the job done.  They are all available over the counter, and you may need to use some, POSSIBLY even all of these options:    Drink plenty of fluids (prune juice may be helpful) and high fiber foods Colace 100 mg by mouth twice a day  Senokot for constipation as directed and as needed Dulcolax (bisacodyl), take with full glass of water  Miralax (polyethylene glycol) once or twice a day as needed.  If you have tried all these things and are unable to have  a bowel movement in the first 3-4 days after surgery call either your surgeon or your primary doctor.    If you experience loose stools or diarrhea, hold the medications until you stool forms back up.  If your symptoms do not get better within 1 week or if they get worse, check with your doctor.  If you experience "the worst abdominal pain ever" or develop nausea or vomiting, please contact the office immediately for further recommendations for treatment.   ITCHING:  If you experience itching with your medications, try taking only a single pain pill, or even half a pain pill at a time.  You can also use Benadryl over the counter for itching or also to help with sleep.   TED HOSE STOCKINGS:  Use stockings on both legs until for at least 2 weeks or as directed by physician office. They may be removed at night for sleeping.  MEDICATIONS:  See your medication summary on the "After Visit Summary" that nursing will review with you.  You may have some home medications which will be placed on hold until you complete the course of blood thinner medication.  It is important for you to complete the blood thinner medication as prescribed.  PRECAUTIONS:  If you experience chest pain or shortness of breath - call 911 immediately for transfer to the hospital emergency department.   If you develop a fever greater that 101 F, purulent drainage  from wound, increased redness or drainage from wound, foul odor from the wound/dressing, or calf pain - CONTACT YOUR SURGEON.                                                   FOLLOW-UP APPOINTMENTS:  If you do not already have a post-op appointment, please call the office for an appointment to be seen by your surgeon.  Guidelines for how soon to be seen are listed in your "After Visit Summary", but are typically between 1-4 weeks after surgery.  OTHER INSTRUCTIONS:   Knee Replacement:  Do not place pillow under knee, focus on keeping the knee straight while resting. CPM instructions: 0-90 degrees, 2 hours in the morning, 2 hours in the afternoon, and 2 hours in the evening. Place foam block, curve side up under heel at all times except when in CPM or when walking.  DO NOT modify, tear, cut, or change the foam block in any way.  MAKE SURE YOU:  Understand these instructions.  Get help right away if you are not doing well or get worse.    Thank you for letting us be a part of your medical care team.  It is a privilege we respect greatly.  We hope these instructions will help you stay on track for a fast and full recovery!   Discharge instructions   Complete by: As directed    INSTRUCTIONS AFTER JOINT REPLACEMENT   Remove items at home which could result in a fall. This includes throw rugs or furniture in walking pathways ICE to the affected joint every three hours while awake for 30 minutes at a time, for at least the first 3-5 days, and then as needed for pain and swelling.  Continue to use ice for pain and swelling. You may notice swelling that will progress down to the foot and ankle.  This is normal after surgery.  Elevate your leg when you are not up walking on it.   Continue to use the breathing machine you got in the hospital (incentive spirometer) which will help keep your temperature down.  It is common for your temperature to cycle up and down following surgery, especially at night when  you are not up moving around and exerting yourself.  The breathing machine keeps your lungs expanded and your temperature down.   DIET:  As you were doing prior to hospitalization, we recommend a well-balanced diet.  DRESSING / WOUND CARE / SHOWERING  Keep the surgical dressing until follow up.  The dressing is water proof, so you can shower without any extra covering.  IF THE DRESSING FALLS OFF or the wound gets wet inside, change the dressing with sterile gauze.  Please use good hand washing techniques before changing the dressing.  Do not use any lotions or creams on the incision until instructed by your surgeon.    ACTIVITY  Increase activity slowly as tolerated, but follow the weight bearing instructions below.   No driving for 6 weeks or until further direction given by your physician.  You cannot drive while taking narcotics.  No lifting or carrying greater than 10 lbs. until further directed by your surgeon. Avoid periods of inactivity such as sitting longer than an hour when not asleep. This helps prevent blood clots.  You may return to work once you are authorized by your doctor.     WEIGHT BEARING   Partial weight bearing with assist device as directed.  50%   EXERCISES  Results after joint replacement surgery are often greatly improved when you follow the exercise, range of motion and muscle strengthening exercises prescribed by your doctor. Safety measures are also important to protect the joint from further injury. Any time any of these exercises cause you to have increased pain or swelling, decrease what you are doing until you are comfortable again and then slowly increase them. If you have problems or questions, call your caregiver or physical therapist for advice.   Rehabilitation is important following a joint replacement. After just a few days of immobilization, the muscles of the leg can become weakened and shrink (atrophy).  These exercises are designed to build up  the tone and strength of the thigh and leg muscles and to improve motion. Often times heat used for twenty to thirty minutes before working out will loosen up your tissues and help with improving the range of motion but do not use heat for the first two weeks following surgery (sometimes heat can increase post-operative swelling).   These exercises can be done on a training (exercise) mat, on the floor, on a table or on a bed. Use whatever works the best and is most comfortable for you.    Use music or television while you are exercising so that the exercises are a pleasant break in your day. This will make your life better with the exercises acting as a break in your routine that you can look forward to.   Perform all exercises about fifteen times, three times per day or as directed.  You should exercise both the operative leg and the other leg as well.   Exercises include:  Quad Sets - Tighten up the muscle on the front of the thigh (Quad) and hold for 5-10 seconds.   Straight Leg Raises - With your knee straight (if you were given a brace, keep it on), lift the leg to 60 degrees, hold  for 3 seconds, and slowly lower the leg.  Perform this exercise against resistance later as your leg gets stronger.  Leg Slides: Lying on your back, slowly slide your foot toward your buttocks, bending your knee up off the floor (only go as far as is comfortable). Then slowly slide your foot back down until your leg is flat on the floor again.  Angel Wings: Lying on your back spread your legs to the side as far apart as you can without causing discomfort.  Hamstring Strength:  Lying on your back, push your heel against the floor with your leg straight by tightening up the muscles of your buttocks.  Repeat, but this time bend your knee to a comfortable angle, and push your heel against the floor.  You may put a pillow under the heel to make it more comfortable if necessary.   A rehabilitation program following joint  replacement surgery can speed recovery and prevent re-injury in the future due to weakened muscles. Contact your doctor or a physical therapist for more information on knee rehabilitation.    CONSTIPATION  Constipation is defined medically as fewer than three stools per week and severe constipation as less than one stool per week.  Even if you have a regular bowel pattern at home, your normal regimen is likely to be disrupted due to multiple reasons following surgery.  Combination of anesthesia, postoperative narcotics, change in appetite and fluid intake all can affect your bowels.   YOU MUST use at least one of the following options; they are listed in order of increasing strength to get the job done.  They are all available over the counter, and you may need to use some, POSSIBLY even all of these options:    Drink plenty of fluids (prune juice may be helpful) and high fiber foods Colace 100 mg by mouth twice a day  Senokot for constipation as directed and as needed Dulcolax (bisacodyl), take with full glass of water  Miralax (polyethylene glycol) once or twice a day as needed.  If you have tried all these things and are unable to have a bowel movement in the first 3-4 days after surgery call either your surgeon or your primary doctor.    If you experience loose stools or diarrhea, hold the medications until you stool forms back up.  If your symptoms do not get better within 1 week or if they get worse, check with your doctor.  If you experience "the worst abdominal pain ever" or develop nausea or vomiting, please contact the office immediately for further recommendations for treatment.   ITCHING:  If you experience itching with your medications, try taking only a single pain pill, or even half a pain pill at a time.  You can also use Benadryl over the counter for itching or also to help with sleep.   TED HOSE STOCKINGS:  Use stockings on both legs until for at least 2 weeks or as directed by  physician office. They may be removed at night for sleeping.  MEDICATIONS:  See your medication summary on the "After Visit Summary" that nursing will review with you.  You may have some home medications which will be placed on hold until you complete the course of blood thinner medication.  It is important for you to complete the blood thinner medication as prescribed.  PRECAUTIONS:  If you experience chest pain or shortness of breath - call 911 immediately for transfer to the hospital emergency department.   If you  develop a fever greater that 101 F, purulent drainage from wound, increased redness or drainage from wound, foul odor from the wound/dressing, or calf pain - CONTACT YOUR SURGEON.                                                   FOLLOW-UP APPOINTMENTS:  If you do not already have a post-op appointment, please call the office for an appointment to be seen by your surgeon.  Guidelines for how soon to be seen are listed in your "After Visit Summary", but are typically between 1-4 weeks after surgery.  OTHER INSTRUCTIONS:   Knee Replacement:  Do not place pillow under knee, focus on keeping the knee straight while resting. CPM instructions: 0-90 degrees, 2 hours in the morning, 2 hours in the afternoon, and 2 hours in the evening. Place foam block, curve side up under heel at all times except when in CPM or when walking.  DO NOT modify, tear, cut, or change the foam block in any way.  MAKE SURE YOU:  Understand these instructions.  Get help right away if you are not doing well or get worse.    Thank you for letting us be a part of your medical care team.  It is a privilege we respect greatly.  We hope these instructions will help you stay on track for a fast and full recovery!   Do not put a pillow under the knee. Place it under the heel.   Complete by: As directed    Do not put a pillow under the knee. Place it under the heel.   Complete by: As directed    Do not put a pillow under  the knee. Place it under the heel.   Complete by: As directed    Driving restrictions   Complete by: As directed    No driving for 6 weeks   Driving restrictions   Complete by: As directed    No driving for 6 weeks   Driving restrictions   Complete by: As directed    No driving for 6 weeks   Increase activity slowly as tolerated   Complete by: As directed    Increase activity slowly as tolerated   Complete by: As directed    Increase activity slowly as tolerated   Complete by: As directed    Lifting restrictions   Complete by: As directed    No lifting for 6 weeks   Lifting restrictions   Complete by: As directed    No lifting for 6 weeks   Lifting restrictions   Complete by: As directed    No lifting for 6 weeks   Partial weight bearing   Complete by: As directed    % Body Weight: 50%   Laterality: right   Extremity: Lower   Patient may shower   Complete by: As directed    You may shower over the brown dressing   Patient may shower   Complete by: As directed    You may shower over the brown dressing   Patient may shower   Complete by: As directed    You may shower over the brown dressing   TED hose   Complete by: As directed    Use stockings (TED hose) for 2-3 weeks on right leg.  You may remove them at night  for sleeping.   TED hose   Complete by: As directed    Use stockings (TED hose) for 2-3 weeks on right leg.  You may remove them at night for sleeping.   TED hose   Complete by: As directed    Use stockings (TED hose) for 2-3 weeks on right leg.  You may remove them at night for sleeping.   Weight bearing as tolerated   Complete by: As directed    Weight bearing as tolerated   Complete by: As directed       DISCHARGE MEDICATIONS:   Allergies as of 05/10/2020      Reactions   Codeine Nausea Only      Medication List    TAKE these medications   aspirin 81 MG chewable tablet Chew 1 tablet (81 mg total) by mouth 2 (two) times daily.   atorvastatin  20 MG tablet Commonly known as: LIPITOR Take 1 tablet (20 mg total) by mouth daily.   diltiazem 120 MG 24 hr capsule Commonly known as: CARDIZEM CD Take 1 capsule (120 mg total) by mouth daily.   glucose blood test strip Commonly known as: ONE TOUCH TEST STRIPS Check blood sugar twice daily   HYDROmorphone 2 MG tablet Commonly known as: DILAUDID Take 1-2 tablets (2-4 mg total) by mouth every 6 (six) hours as needed for moderate pain or severe pain.   HYDROmorphone 2 MG tablet Commonly known as: Dilaudid Take 1-2 tablets (2-4 mg total) by mouth every 6 (six) hours as needed for up to 6 days for severe pain.   Janumet XR [id number] MG Tb24 Generic drug: SitaGLIPtin-MetFORMIN HCl Take 1 tablet by mouth daily.   lisinopril 10 MG tablet Commonly known as: ZESTRIL TAKE 1 TABLET BY MOUTH DAILY.   methocarbamol 500 MG tablet Commonly known as: ROBAXIN Take 1 tablet (500 mg total) by mouth every 8 (eight) hours as needed for muscle spasms.   methocarbamol 500 MG tablet Commonly known as: ROBAXIN Take 1 tablet (500 mg total) by mouth every 8 (eight) hours as needed for muscle spasms.   metoprolol tartrate 50 MG tablet Commonly known as: LOPRESSOR Take 50 mg by mouth daily.   nystatin-triamcinolone ointment Commonly known as: MYCOLOG Apply 1 application topically 2 (two) times daily.   OneTouch Delica Lancets Fine Misc Use qd            Discharge Care Instructions  (From admission, onward)         Start     Ordered   05/10/20 0000  Partial weight bearing       Question Answer Comment  % Body Weight 50%   Laterality right   Extremity Lower      05/10/20 0917   05/10/20 0000  Change dressing       Comments: DO NOT CHANGE YOUR DRESSING   05/10/20 0917   05/10/20 0000  Change dressing       Comments: DO NOT CHANGE YOUR DRESSING   05/10/20 0920   05/10/20 0000  Weight bearing as tolerated        05/10/20 0920   05/10/20 0000  Change dressing       Comments: DO  NOT CHANGE YOUR DRESSING   05/10/20 1210   05/10/20 0000  Weight bearing as tolerated        05/10/20 1210          FOLLOW UP VISIT: 2 weeks   Follow-up Information    [facility] Follow up.  Specialty: Home Health Services Why: to provide home health physical therapy Contact information: [place] [address] [phone]               DISPOSITION:   Home  CONDITION:  Stable  INSTRUCTIONS AFTER JOINT REPLACEMENT   o Remove items at home which could result in a fall. This includes throw rugs or furniture in walking pathways o ICE to the affected joint every three hours while awake for 30 minutes at a time, for at least the first 3-5 days, and then as needed for pain and swelling.  Continue to use ice for pain and swelling. You may notice swelling that will progress down to the foot and ankle.  This is normal after surgery.  Elevate your leg when you are not up walking on it.   o Continue to use the breathing machine you got in the hospital (incentive spirometer) which will help keep your temperature down.  It is common for your temperature to cycle up and down following surgery, especially at night when you are not up moving around and exerting yourself.  The breathing machine keeps your lungs expanded and your temperature down.   DIET:  As you were doing prior to hospitalization, we recommend a well-balanced diet.  DRESSING / WOUND CARE / SHOWERING  Keep the surgical dressing until follow up.  The dressing is water proof, so you can shower without any extra covering.  IF THE DRESSING FALLS OFF or the wound gets wet inside, change the dressing with sterile gauze.  Please use good hand washing techniques before changing the dressing.  Do not use any lotions or creams on the incision until instructed by your surgeon.    ACTIVITY  o Increase activity slowly as tolerated, but follow the weight bearing instructions below.   o No driving for 6 weeks  or until further direction given by your physician.  You cannot drive while taking narcotics.  o No lifting or carrying greater than 10 lbs. until further directed by your surgeon. o Avoid periods of inactivity such as sitting longer than an hour when not asleep. This helps prevent blood clots.  o You may return to work once you are authorized by your doctor.     WEIGHT BEARING   Weight bearing as tolerated with assist device (walker, cane, etc) as directed, use it as long as suggested by your surgeon or therapist, typically at least 4-6 weeks.   EXERCISES  Results after joint replacement surgery are often greatly improved when you follow the exercise, range of motion and muscle strengthening exercises prescribed by your doctor. Safety measures are also important to protect the joint from further injury. Any time any of these exercises cause you to have increased pain or swelling, decrease what you are doing until you are comfortable again and then slowly increase them. If you have problems or questions, call your caregiver or physical therapist for advice.   Rehabilitation is important following a joint replacement. After just a few days of immobilization, the muscles of the leg can become weakened and shrink (atrophy).  These exercises are designed to build up the tone and strength of the thigh and leg muscles and to improve motion. Often times heat used for twenty to thirty minutes before working out will loosen up your tissues and help with improving the range of motion but do not use heat for the first two weeks following surgery (sometimes heat can increase post-operative swelling).  These exercises can be done on a training (exercise) mat, on the floor, on a table or on a bed. Use whatever works the best and is most comfortable for you.    Use music or television while you are exercising so that the exercises are a pleasant break in your day. This will make your life better with the  exercises acting as a break in your routine that you can look forward to.   Perform all exercises about fifteen times, three times per day or as directed.  You should exercise both the operative leg and the other leg as well.  Exercises include:   . Quad Sets - Tighten up the muscle on the front of the thigh (Quad) and hold for 5-10 seconds.   . Straight Leg Raises - With your knee straight (if you were given a brace, keep it on), lift the leg to 60 degrees, hold for 3 seconds, and slowly lower the leg.  Perform this exercise against resistance later as your leg gets stronger.  . Leg Slides: Lying on your back, slowly slide your foot toward your buttocks, bending your knee up off the floor (only go as far as is comfortable). Then slowly slide your foot back down until your leg is flat on the floor again.  [place] Haring Wings: Lying on your back spread your legs to the side as far apart as you can without causing discomfort.  . Hamstring Strength:  Lying on your back, push your heel against the floor with your leg straight by tightening up the muscles of your buttocks.  Repeat, but this time bend your knee to a comfortable angle, and push your heel against the floor.  You may put a pillow under the heel to make it more comfortable if necessary.   A rehabilitation program following joint replacement surgery can speed recovery and prevent re-injury in the future due to weakened muscles. Contact your doctor or a physical therapist for more information on knee rehabilitation.    CONSTIPATION  Constipation is defined medically as fewer than three stools per week and severe constipation as less than one stool per week.  Even if you have a regular bowel pattern at home, your normal regimen is likely to be disrupted due to multiple reasons following surgery.  Combination of anesthesia, postoperative narcotics, change in appetite and fluid intake all can affect your bowels.   YOU MUST use at least one of the  following options; they are listed in order of increasing strength to get the job done.  They are all available over the counter, and you may need to use some, POSSIBLY even all of these options:    Drink plenty of fluids (prune juice may be helpful) and high fiber foods Colace 100 mg by mouth twice a day  Senokot for constipation as directed and as needed Dulcolax (bisacodyl), take with full glass of water  Miralax (polyethylene glycol) once or twice a day as needed.  If you have tried all these things and are unable to have a bowel movement in the first 3-4 days after surgery call either your surgeon or your primary doctor.    If you experience loose stools or diarrhea, hold the medications until you stool forms back up.  If your symptoms do not get better within 1 week or if they get worse, check with your doctor.  If you experience "the worst abdominal pain ever" or develop nausea or vomiting, please contact the office immediately for further  recommendations for treatment.   ITCHING:  If you experience itching with your medications, try taking only a single pain pill, or even half a pain pill at a time.  You can also use Benadryl over the counter for itching or also to help with sleep.   TED HOSE STOCKINGS:  Use stockings on both legs until for at least 2 weeks or as directed by physician office. They may be removed at night for sleeping.  MEDICATIONS:  See your medication summary on the "After Visit Summary" that nursing will review with you.  You may have some home medications which will be placed on hold until you complete the course of blood thinner medication.  It is important for you to complete the blood thinner medication as prescribed.  PRECAUTIONS:  If you experience chest pain or shortness of breath - call 911 immediately for transfer to the hospital emergency department.   If you develop a fever greater that 101 F, purulent drainage from wound, increased redness or drainage from  wound, foul odor from the wound/dressing, or calf pain - CONTACT YOUR SURGEON.                                                   FOLLOW-UP APPOINTMENTS:  If you do not already have a post-op appointment, please call the office for an appointment to be seen by your surgeon.  Guidelines for how soon to be seen are listed in your "After Visit Summary", but are typically between 1-4 weeks after surgery.  OTHER INSTRUCTIONS:   Knee Replacement:  Do not place pillow under knee, focus on keeping the knee straight while resting. CPM instructions: 0-90 degrees, 2 hours in the morning, 2 hours in the afternoon, and 2 hours in the evening. Place foam block, curve side up under heel at all times except when in CPM or when walking.  DO NOT modify, tear, cut, or change the foam block in any way.   DENTAL ANTIBIOTICS:  In most cases prophylactic antibiotics for Dental procdeures after total joint surgery are not necessary.  Exceptions are as follows:  1. History of prior total joint infection  2. Severely immunocompromised (Organ Transplant, cancer chemotherapy, Rheumatoid biologic meds such as [place])  3. Poorly controlled diabetes (A1C &gt; 8.0, blood glucose over 200)  If you have one of these conditions, contact your surgeon for an antibiotic prescription, prior to your dental procedure.   MAKE SURE YOU:  . Understand these instructions.  . Get help right away if you are not doing well or get worse.    Thank you for letting us be a part of your medical care team.  It is a privilege we respect greatly.  We hope these instructions will help you stay on track for a fast and full recovery!      Mike Craze [place], [place] [phone]  05/10/2020 12:35 PM

## 2020-05-10 NOTE — Op Note (Signed)
PATIENT ID: AYDEEN BLUME        MRN:  [id number]          DOB/AGE: 71-04-1951 / 71 y.o.    [name], MD   [name], PA-C [address] [place], [place]  [address]                             [phone]   PROGRESS NOTE  Subjective:  negative for Chest Pain  negative for Shortness of Breath  negative for Nausea/Vomiting   negative for Calf Pain    Tolerating Diet: yes         Patient reports pain as mild.     Adductor canal block still in effect  Objective: Vital signs in last 24 hours:   Patient Vitals for the past 24 hrs:  BP Temp Temp src Pulse Resp SpO2  05/10/20 0551 (!) 129/95 (!) 97.5 F (36.4 C) Oral 82 17 98 %  05/10/20 0219 118/79 97.6 F (36.4 C) Oral 95 19 98 %  05/09/20 2143 118/70 97.9 F (36.6 C) Oral 96 17 97 %  05/09/20 1821 (!) 148/97 97.8 F (36.6 C) -- 95 16 96 %  05/09/20 1402 136/90 98.1 F (36.7 C) -- 76 16 98 %  05/09/20 1313 (!) 137/91 97.9 F (36.6 C) -- 76 16 98 %  05/09/20 1210 131/84 97.6 F (36.4 C) -- 72 16 99 %  05/09/20 1109 128/83 97.9 F (36.6 C) Oral 68 16 98 %  05/09/20 1051 (!) 159/77 98 F (36.7 C) -- 73 18 97 %  05/09/20 1045 (!) 140/92 -- -- 68 18 97 %  05/09/20 1030 (!) 142/82 -- -- 69 12 96 %  05/09/20 1015 127/86 -- -- 70 12 94 %  05/09/20 1000 106/77 -- -- 74 16 98 %  05/09/20 0957 118/80 98.1 F (36.7 C) -- 74 12 97 %      Intake/Output from previous day:   10/26 0701 - 10/27 0700 In: 4019.6 [P.O.:980; I.V.:2637.6] Out: 2200 [Urine:2150]   Intake/Output this shift:   No intake/output data recorded.   Intake/Output      10/26 0701 - 10/27 0700 10/27 0701 - 10/28 0700   P.O. 980    I.V. (mL/kg) 2637.6 (27.9)    Other 0    IV Piggyback 402    Total Intake(mL/kg) 4019.6 (42.5)    Urine (mL/kg/hr) 2150 (0.9)    Blood 50    Total Output 2200    Net +1819.6            LABORATORY DATA: Recent Labs    05/10/20 0313  WBC 16.2*  HGB 12.6*  HCT 37.3*  PLT 179   Recent Labs     05/10/20 0313  NA 134*  K 4.7  CL 103  CO2 24  BUN 20  CREATININE 1.13  GLUCOSE 263*  CALCIUM 8.5*   Lab Results  Component Value Date   INR 1.2 05/01/2020   INR 1.1 04/30/2019    Recent Radiographic Studies :  DG Chest 2 View  Result Date: 05/01/2020 CLINICAL DATA:  Preop evaluation. EXAM: CHEST - 2 VIEW COMPARISON:  04/30/2019 chest radiograph and prior. FINDINGS: Minimal left basilar atelectasis. Clear right lung. No pneumothorax or pleural effusion. Cardiomediastinal silhouette within normal limits. Osseous structures are unremarkable. IMPRESSION: No focal airspace disease.  Left basilar atelectasis. Electronically Signed   By: Primitivo Gauze M.D.   On: 05/01/2020  14:30     Examination:  General appearance: alert, cooperative and no distress  Wound Exam: clean, dry, intact   Drainage:  None: wound tissue dry  Motor Exam: EHL, FHL, Anterior Tibial and Posterior Tibial Intact  Sensory Exam: Superficial Peroneal, Deep Peroneal and Tibial normal  Vascular Exam: Normal  Assessment:    1 Day Post-Op  Procedure(s) (LRB): RIGHT TOTAL KNEE ARTHROPLASTY (Right)  ADDITIONAL DIAGNOSIS:  Active Problems:   S/P TKR (total knee replacement) using cement, right    Plan: Physical Therapy as ordered Weight Bearing as Tolerated (WBAT)  DVT Prophylaxis:  Aspirin and TED hose  DISCHARGE PLAN: Home  DISCHARGE NEEDS: HHPT, CPM, Walker and 3-in-1 comode seat   Patient's anticipated LOS is less than 2 midnights, meeting these requirements: - Younger than 89 - Lives within 1 hour of care - Has a competent adult at home to recover with post-op recover - NO history of  - Chronic pain requiring opiods  - Diabetes  - Coronary Artery Disease  - Heart failure  - Heart attack  - Stroke  - DVT/VTE  - Cardiac arrhythmia  - Respiratory Failure/COPD  - Renal failure  - Anemia  - Advanced Liver disease  awake and alert.foley out-has not voided yet. Dressing changed  right knee to aquacell-wound clean and dry. No calf pain. Will discharge today to home. Can control diabetes at home         [name], [place]  05/10/2020 7:51 AM

## 2020-05-10 NOTE — Telephone Encounter (Signed)
Tried to call patient. Left message that we have received authorization form insurance for his pain medication.

## 2020-05-10 NOTE — TOC Transition Note (Signed)
Transition of Care [facility]) - CM/SW Discharge Note   Patient Details  Name: Charles Trujillo MRN: [id number] Date of Birth: 24-Apr-1951  Transition of Care [facility]) CM/SW Contact:  Lennart Pall, LCSW Phone Number: 05/10/2020, 11:34 AM   Clinical Narrative:    Met briefly with pt who confirms he has needed DME.  Plan for HHPT via [facility].  No TOC needs.   Final next level of care: [place] Barriers to Discharge: No Barriers Identified   Patient Goals and CMS Choice Patient states their goals for this hospitalization and ongoing recovery are:: return home      Discharge Placement                       Discharge Plan and Services                DME Arranged: N/A DME Agency: NA       HH Arranged: PT HH Agency: [facility] (formerly [facility])     Representative spoke with at [place]: arranged prior to surgery by MD office  Social Determinants of Health (SDOH) Interventions     Readmission Risk Interventions No flowsheet data found.

## 2020-05-10 NOTE — Progress Notes (Signed)
Patient discharged to home w/ family. Given all belongings, instructions. Verbalized understanding of instructions. Escorted to pov via w/c. 

## 2020-05-10 NOTE — Telephone Encounter (Signed)
Pt called asking that Dr.Whitfield send in an authorization for his dilaudid rx please and a CB to let him know it's been done  [phone]

## 2020-05-10 NOTE — Progress Notes (Signed)
Physical Therapy Treatment Patient Details Name: Charles Trujillo MRN: [id number] DOB: 08-24-1950 Today's Date: 05/10/2020    History of Present Illness 69 yo male s/p R TKA 05/09/20. Hx of BPPV, L TKA 2020, DM.    PT Comments    Progressing well with mobility. Reviewed exercises, gait training, and stair training. All education completed. Okay to d/c from PT standpoint.    Follow Up Recommendations  Follow surgeon's recommendation for DC plan and follow-up therapies     Equipment Recommendations  None recommended by PT    Recommendations for Other Services       Precautions / Restrictions Precautions Precautions: Fall;Knee Restrictions Weight Bearing Restrictions: No Other Position/Activity Restrictions: WBAT    Mobility  Bed Mobility Overal bed mobility: Needs Assistance Bed Mobility: Supine to Sit     Supine to sit: Supervision;HOB elevated        Transfers Overall transfer level: Needs assistance Equipment used: Rolling walker (2 wheeled) Transfers: Sit to/from Stand Sit to Stand: Min guard;From elevated surface         General transfer comment: VCs safety, technique, hand placement. Min guard for safety  Ambulation/Gait Ambulation/Gait assistance: Min guard Gait Distance (Feet): 75 Feet Assistive device: Rolling walker (2 wheeled) Gait Pattern/deviations: Step-to pattern     General Gait Details: VCs safety, technique, sequence. Min guard for safety   Stairs Stairs: Yes Stairs assistance: Min assist Stair Management: Step to pattern;Backwards;With walker Number of Stairs: 2 General stair comments: VCs safety, technique, sequence. Wife present to observe and practice with pt. Assist to stabilize RW.Marland Kitchen    Wheelchair Mobility    Modified Rankin (Stroke Patients Only)       Balance Overall balance assessment: Needs assistance         Standing balance support: Bilateral upper extremity supported Standing balance-Leahy Scale: Poor                               Cognition Arousal/Alertness: Awake/alert Behavior During Therapy: WFL for tasks assessed/performed Overall Cognitive Status: Within Functional Limits for tasks assessed                                        Exercises Total Joint Exercises Ankle Circles/Pumps: AROM;Both;10 reps Quad Sets: AROM;Both;10 reps Hip ABduction/ADduction: AROM;Right;10 reps Straight Leg Raises: AROM;Right;10 reps Long Arc Quad: AROM;Right;10 reps Knee Flexion: AROM;Right;10 reps;Seated Goniometric ROM: ~10-75 degrees    General Comments        Pertinent Vitals/Pain Pain Assessment: 0-10 Pain Score: 5  Pain Location: R knee Pain Descriptors / Indicators: Discomfort;Sore;Aching Pain Intervention(s): Monitored during session;Repositioned    Home Living                      Prior Function            PT Goals (current goals can now be found in the care plan section) Progress towards PT goals: Progressing toward goals    Frequency    7X/week      PT Plan Current plan remains appropriate    Co-evaluation              AM-PAC PT "6 Clicks" Mobility   Outcome Measure  Help needed turning from your back to your side while in a flat bed without using bedrails?: None Help needed moving from lying on  your back to sitting on the side of a flat bed without using bedrails?: None Help needed moving to and from a bed to a chair (including a wheelchair)?: A Little Help needed standing up from a chair using your arms (e.g., wheelchair or bedside chair)?: A Little Help needed to walk in hospital room?: A Little Help needed climbing 3-5 steps with a railing? : A Little 6 Click Score: 20    End of Session Equipment Utilized During Treatment: Gait belt Activity Tolerance: Patient tolerated treatment well Patient left: in bed;with call bell/phone within reach;with family/visitor present   PT Visit Diagnosis: Pain;Other  abnormalities of gait and mobility (R26.89) Pain - Right/Left: Right Pain - part of body: Knee     Time: 1696-7893 PT Time Calculation (min) (ACUTE ONLY): 20 min  Charges:  $Gait Training: 8-22 mins                         Doreatha Massed, PT Acute Rehabilitation  Office: [phone] Pager: [phone]

## 2020-05-10 NOTE — Telephone Encounter (Signed)
Tried to call patient. No answer. Left message for more information.  In the meantime I have sent for prior authorization for his pain medicine and awaiting results.

## 2020-05-17 ENCOUNTER — Encounter: Payer: Self-pay | Admitting: Orthopaedic Surgery

## 2020-05-18 NOTE — Telephone Encounter (Signed)
called

## 2020-05-22 ENCOUNTER — Other Ambulatory Visit: Payer: Self-pay | Admitting: Orthopedic Surgery

## 2020-05-22 ENCOUNTER — Telehealth: Payer: Self-pay | Admitting: *Deleted

## 2020-05-22 MED ORDER — CEPHALEXIN 500 MG PO CAPS: 500.0000 mg | ORAL_CAPSULE | Freq: Four times a day (QID) | ORAL | 0 refills | Status: DC

## 2020-05-22 MED FILL — CEPHALEXIN 500 MG CAPSULE: 500 | 10 days supply | Qty: 40 | Fill #0

## 2020-05-22 NOTE — Telephone Encounter (Signed)
RNCM received call from PT [facility]) and noted that patient has a small reddened area on his shin that he reports as very painful. Picture received, which was shared with Dr. Durward Fortes. New Abx prescribed and sent to patient's pharmacy. Call to patient and left VM on his cell number. No answer on home phone. Updated on newly prescribed abx and why this was prescribed.

## 2020-05-24 ENCOUNTER — Encounter: Payer: Self-pay | Admitting: Orthopaedic Surgery

## 2020-05-24 ENCOUNTER — Other Ambulatory Visit: Payer: Self-pay

## 2020-05-24 ENCOUNTER — Ambulatory Visit (INDEPENDENT_AMBULATORY_CARE_PROVIDER_SITE_OTHER): Payer: PRIVATE HEALTH INSURANCE | Admitting: Orthopaedic Surgery

## 2020-05-24 ENCOUNTER — Ambulatory Visit (INDEPENDENT_AMBULATORY_CARE_PROVIDER_SITE_OTHER): Payer: PRIVATE HEALTH INSURANCE

## 2020-05-24 VITALS — Ht 68.0 in | Wt 208.0 lb

## 2020-05-24 DIAGNOSIS — Z96651 Presence of right artificial knee joint: Secondary | ICD-10-CM | POA: Diagnosis not present

## 2020-05-24 DIAGNOSIS — M1711 Unilateral primary osteoarthritis, right knee: Secondary | ICD-10-CM

## 2020-05-24 NOTE — Progress Notes (Signed)
Office Visit Note   Patient: Charles Trujillo           Date of Birth: October 29, 1950           MRN: [id number] Visit Date: 05/24/2020              Requested by: [id number] [place], [place] [address] [place],  [place] [address] PCP: Carollee Herter, Alferd Apa, DO   Assessment & Plan: Visit Diagnoses:  1. S/P total knee arthroplasty, right   2. Primary osteoarthritis of right knee   3. Total knee replacement status, right     Plan: 2-week status post primary right total knee replacement doing quite well. No fever or chills. Did have a problem with anterior tibial pain with subsequent area of erythema. This could've been cellulitis. He was placed on Keflex 500 mg four times a day 2 days ago and is feeling much better. We'll continue for another week. Staples removed and Steri-Strips applied to the knee. Range of motion is zero to about 90 degrees. Continue with therapy. Progress to cane. Office 3 weeks  Follow-Up Instructions: Return in about 3 weeks (around 06/14/2020).   Orders:  Orders Placed This Encounter  Procedures  . XR KNEE 3 VIEW RIGHT   No orders of the defined types were placed in this encounter.     Procedures: No procedures performed   Clinical Data: No additional findings.   Subjective: Chief Complaint  Patient presents with  . Right Knee - Follow-up    Right total knee arthroplasty 05/09/2020  Patient presents today for follow up on his right knee. He is now two weeks out from a right total knee arthroplasty. His surgery was on 05/09/2020. He is doing well. He has been doing home therapy. He is walking with the assistance of a walker.   HPI  Review of Systems   Objective: Vital Signs: Ht 5\' 8"  (1.727 m)   Wt 208 lb (94.3 kg)   BMI 31.63 kg/m   Physical Exam  Ortho Exam right knee incision healing without problem. Clips removed and Steri-Strips applied. No opening with varus or valgus stress. Full extension and flex to just over 90  degrees. Anterior tibial pain is considerably better there is a little bit of residual pinkish discoloration of the distal anterior tibia but proximally looks just fine. No distal edema. Motor exam intact  Specialty Comments:  No specialty comments available.  Imaging: XR KNEE 3 VIEW RIGHT  Result Date: 05/24/2020 Films of the right total knee replacement reveal excellent position of the components. No evidence of any complications. Nice glue mantle and alignment    PMFS History: Patient Active Problem List   Diagnosis Date Noted  . Total knee replacement status, right 05/09/2020  . Uncontrolled type 2 diabetes mellitus with hyperglycemia ([place]) 03/14/2020  . Degenerative arthritis of right knee 03/09/2020  . Arthritis of right ankle 03/09/2020  . Pain in right ankle and joints of right foot 10/13/2019  . Total knee replacement status, left 05/18/2019  . Osteoarthritis of left knee 05/04/2019  . Unilateral primary osteoarthritis, left knee 04/20/2019  . Hyperlipidemia associated with type 2 diabetes mellitus ([place]) 05/19/2018  . Degenerative arthritis of knee, bilateral 06/20/2017  . Left inguinal pain 11/06/2015  . Dysuria 03/27/2015  . Fatigue 03/27/2015  . Benign paroxysmal positional vertigo 04/22/2014  . Dizziness 04/22/2014  . Primary localized osteoarthrosis, lower leg 09/15/2013  . Obesity (BMI 30-39.9) 08/19/2013  . Other and  unspecified hyperlipidemia 02/19/2013  . HLD (hyperlipidemia) 03/12/2012  . Tachycardia 03/06/2012  . Inguinal hernia unilateral, non-recurrent 08/02/2011  . Inguinal hernia 06/26/2011  . Preventative health care 02/01/2011  . DM (diabetes mellitus) type II uncontrolled, periph vascular disorder ([place]) 10/20/2009  . CARPAL TUNNEL SYNDROME, BILATERAL 10/03/2009  . ERECTILE DYSFUNCTION, ORGANIC, HX OF 02/06/2009  . DEPRESSION 01/20/2008  . Essential hypertension 01/20/2008  . URI 06/25/2007  . TRANSAMINASES, SERUM, ELEVATED 02/02/2007   Past  Medical History:  Diagnosis Date  . Anxiety   . Arthritis    bilateral knees  . Depression   . DM2 (diabetes mellitus, type 2) ([place])   . Dysrhythmia     benign tachycardia, takes [place]  . Elevated LFTs    previously evaluated by Dr Sharlett Iles; h/o "fatty liver"  . GERD (gastroesophageal reflux disease)   . HLD (hyperlipidemia)   . Hypertension   . Inguinal hernia    right  . Squamous cell carcinoma    top of head    Family History  Problem Relation Age of Onset  . Diabetes Mother   . Hypertension Mother   . Heart disease Mother   . Heart failure Mother   . Stroke Father 43  . Parkinsonism Father   . Depression Brother   . Alcohol abuse Brother   . Cancer Maternal Grandmother        bone  . Colon cancer Neg Hx   . Colon polyps Neg Hx   . Esophageal cancer Neg Hx   . Rectal cancer Neg Hx   . Stomach cancer Neg Hx     Past Surgical History:  Procedure Laterality Date  . ACHILLES TENDON REPAIR  1999  . CARPAL TUNNEL RELEASE Right 11/04/2014   Procedure: LIMITED OPEN RIGHT CARPAL TUNNEL RELEASE;  Surgeon: Roseanne Kaufman, MD;  Location: [facility];  Service: Orthopedics;  Laterality: Right;  . CARPAL TUNNEL WITH CUBITAL TUNNEL Left 07/22/2014   Procedure: LEFT CARPAL TUNNEL RELEASE AND CUBITAL TUNNEL RELEASE TRANSPOSITION;  Surgeon: Roseanne Kaufman, MD;  Location: [facility];  Service: Orthopedics;  Laterality: Left;  . COLONOSCOPY    . EYE SURGERY     laser eye surgery bilat   . HERNIA REPAIR  07/2011   left side  . INGUINAL HERNIA REPAIR  08/09/2011   Procedure: LAPAROSCOPIC INGUINAL HERNIA;  Surgeon: Joyice Faster. Cornett, MD;  Location: [facility];  Service: General;  Laterality: Right;  . KNEE ARTHROSCOPY  1993   left knee  . KNEE SURGERY     age 69- right  . SQUAMOUS CELL CARCINOMA EXCISION     left knee  . TOTAL KNEE ARTHROPLASTY Left 05/04/2019   Procedure: LEFT TOTAL KNEE ARTHROPLASTY;  Surgeon: Garald Balding, MD;  [facility];   Service: Orthopedics;  Laterality: Left;  . TOTAL KNEE ARTHROPLASTY Right 05/09/2020   Procedure: RIGHT TOTAL KNEE ARTHROPLASTY;  Surgeon: Garald Balding, MD;  Location: [facility];  Service: Orthopedics;  Laterality: Right;   Social History   Occupational History  . 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 Smoking status: Current Some Day Smoker    Types: Cigars  . Smokeless tobacco: Never Used  . Tobacco comment: smokes one every other day  Vaping Use  . Vaping Use: Never used  Substance and Sexual Activity  . Alcohol use: Yes    Alcohol/week: 3.0 - 4.0 standard drinks  Types: 3 - 4 Cans of beer per week    Comment: social  . Drug use: No  . Sexual activity: Yes    Partners: Female

## 2020-05-25 ENCOUNTER — Encounter: Payer: Self-pay | Admitting: Orthopaedic Surgery

## 2020-06-06 ENCOUNTER — Encounter: Payer: Self-pay | Admitting: Orthopaedic Surgery

## 2020-06-15 ENCOUNTER — Encounter: Payer: Self-pay | Admitting: Orthopaedic Surgery

## 2020-06-15 ENCOUNTER — Other Ambulatory Visit: Payer: Self-pay

## 2020-06-15 ENCOUNTER — Ambulatory Visit (INDEPENDENT_AMBULATORY_CARE_PROVIDER_SITE_OTHER): Payer: PRIVATE HEALTH INSURANCE | Admitting: Orthopaedic Surgery

## 2020-06-15 ENCOUNTER — Telehealth: Payer: Self-pay | Admitting: Orthopaedic Surgery

## 2020-06-15 VITALS — Ht 68.0 in | Wt 208.0 lb

## 2020-06-15 DIAGNOSIS — Z96651 Presence of right artificial knee joint: Secondary | ICD-10-CM

## 2020-06-15 DIAGNOSIS — M1711 Unilateral primary osteoarthritis, right knee: Secondary | ICD-10-CM

## 2020-06-15 NOTE — Telephone Encounter (Signed)
[employer] form received. Sent to [employer].

## 2020-06-15 NOTE — Progress Notes (Signed)
Office Visit Note   Patient: Charles Trujillo           Date of Birth: 04/18/1951           MRN: [id number] Visit Date: 06/15/2020              Requested by: [address], [place], [place] [address] [place],  [place] [address] PCP: Carollee Herter, Alferd Apa, DO   Assessment & Plan: Visit Diagnoses:  1. Primary osteoarthritis of right knee   2. Total knee replacement status, right     Plan: 5 weeks status post primary right total knee replacement and doing well. Using Tylenol for pain and occasionally uses a single-point cane. Wants to start driving. Will remain out of work for probably another month at which point he probably will need a note to return to work. I'll plan to see him back in 6 weeks. Continue with his home exercises.  Follow-Up Instructions: Return in about 6 weeks (around 07/27/2020).   Orders:  No orders of the defined types were placed in this encounter.  No orders of the defined types were placed in this encounter.     Procedures: No procedures performed   Clinical Data: No additional findings.   Subjective: Chief Complaint  Patient presents with  . Right Knee - Follow-up    Right total knee arthroplasty 05/09/2020  Patient presents today for follow up on his right knee. He is now 5 weeks out from a right total knee arthroplasty. He is doing great and has no complaints. He has finished physical therapy. He is taking Tylenol if needed. No related fever chills and little if any pain. Presently using a single-point cane. Very happy with the results  HPI  Review of Systems   Objective: Vital Signs: Ht 5\' 8"  (1.727 m)   Wt 208 lb (94.3 kg)   BMI 31.63 kg/m   Physical Exam  Ortho Exam right total knee incision healing without problem. No calf pain or swelling. No erythema. Full extension I measured 105 degrees with a goniometer of flexion. No instability or localized areas of tenderness.  Specialty Comments:  No specialty comments  available.  Imaging: No results found.   PMFS History: Patient Active Problem List   Diagnosis Date Noted  . Total knee replacement status, right 05/09/2020  . Uncontrolled type 2 diabetes mellitus with hyperglycemia ([place]) 03/14/2020  . Degenerative arthritis of right knee 03/09/2020  . Arthritis of right ankle 03/09/2020  . Pain in right ankle and joints of right foot 10/13/2019  . Total knee replacement status, left 05/18/2019  . Osteoarthritis of left knee 05/04/2019  . Unilateral primary osteoarthritis, left knee 04/20/2019  . Hyperlipidemia associated with type 2 diabetes mellitus ([place]) 05/19/2018  . Degenerative arthritis of knee, bilateral 06/20/2017  . Left inguinal pain 11/06/2015  . Dysuria 03/27/2015  . Fatigue 03/27/2015  . Benign paroxysmal positional vertigo 04/22/2014  . Dizziness 04/22/2014  . Primary localized osteoarthrosis, lower leg 09/15/2013  . Obesity (BMI 30-39.9) 08/19/2013  . Other and unspecified hyperlipidemia 02/19/2013  . HLD (hyperlipidemia) 03/12/2012  . Tachycardia 03/06/2012  . Inguinal hernia unilateral, non-recurrent 08/02/2011  . Inguinal hernia 06/26/2011  . Preventative health care 02/01/2011  . DM (diabetes mellitus) type II uncontrolled, periph vascular disorder ([place]) 10/20/2009  . CARPAL TUNNEL SYNDROME, BILATERAL 10/03/2009  . ERECTILE DYSFUNCTION, ORGANIC, HX OF 02/06/2009  . DEPRESSION 01/20/2008  . Essential hypertension 01/20/2008  . URI 06/25/2007  . TRANSAMINASES,  SERUM, ELEVATED 02/02/2007   Past Medical History:  Diagnosis Date  . Anxiety   . Arthritis    bilateral knees  . Depression   . DM2 (diabetes mellitus, type 2) ([place])   . Dysrhythmia     benign tachycardia, takes [place]  . Elevated LFTs    previously evaluated by Dr Sharlett Iles; h/o "fatty liver"  . GERD (gastroesophageal reflux disease)   . HLD (hyperlipidemia)   . Hypertension   . Inguinal hernia    right  . Squamous cell carcinoma    top of head     Family History  Problem Relation Age of Onset  . Diabetes Mother   . Hypertension Mother   . Heart disease Mother   . Heart failure Mother   . Stroke Father 73  . Parkinsonism Father   . Depression Brother   . Alcohol abuse Brother   . Cancer Maternal Grandmother        bone  . Colon cancer Neg Hx   . Colon polyps Neg Hx   . Esophageal cancer Neg Hx   . Rectal cancer Neg Hx   . Stomach cancer Neg Hx     Past Surgical History:  Procedure Laterality Date  . ACHILLES TENDON REPAIR  1999  . CARPAL TUNNEL RELEASE Right 11/04/2014   Procedure: LIMITED OPEN RIGHT CARPAL TUNNEL RELEASE;  Surgeon: Roseanne Kaufman, MD;  Location: [facility];  Service: Orthopedics;  Laterality: Right;  . CARPAL TUNNEL WITH CUBITAL TUNNEL Left 07/22/2014   Procedure: LEFT CARPAL TUNNEL RELEASE AND CUBITAL TUNNEL RELEASE TRANSPOSITION;  Surgeon: Roseanne Kaufman, MD;  Location: [facility];  Service: Orthopedics;  Laterality: Left;  . COLONOSCOPY    . EYE SURGERY     laser eye surgery bilat   . HERNIA REPAIR  07/2011   left side  . INGUINAL HERNIA REPAIR  08/09/2011   Procedure: LAPAROSCOPIC INGUINAL HERNIA;  Surgeon: Joyice Faster. Cornett, MD;  [facility];  Service: General;  Laterality: Right;  . KNEE ARTHROSCOPY  1993   left knee  . KNEE SURGERY     age 69- right  . SQUAMOUS CELL CARCINOMA EXCISION     left knee  . TOTAL KNEE ARTHROPLASTY Left 05/04/2019   Procedure: LEFT TOTAL KNEE ARTHROPLASTY;  Surgeon: Garald Balding, MD;  [facility];  Service: Orthopedics;  Laterality: Left;  . TOTAL KNEE ARTHROPLASTY Right 05/09/2020   Procedure: RIGHT TOTAL KNEE ARTHROPLASTY;  Surgeon: Garald Balding, MD;  [facility];  Service: Orthopedics;  Laterality: Right;   Social History   Occupational History  . 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 Smoking status: Current Some Day Smoker     Types: Cigars  . Smokeless tobacco: Never Used  . Tobacco comment: smokes one every other day  Vaping Use  . Vaping Use: Never used  Substance and Sexual Activity  . Alcohol use: Yes    Alcohol/week: 3.0 - 4.0 standard drinks    Types: 3 - 4 Cans of beer per week    Comment: social  . Drug use: No  . Sexual activity: Yes    Partners: Female

## 2020-06-27 ENCOUNTER — Telehealth: Payer: Self-pay | Admitting: Orthopaedic Surgery

## 2020-06-27 NOTE — Telephone Encounter (Signed)
Received S/T disability paperwork   Forwarding to [place] today

## 2020-06-27 NOTE — Telephone Encounter (Signed)
Forwarding S/T disability paperwork to [facility] today

## 2020-07-04 ENCOUNTER — Telehealth: Payer: Self-pay | Admitting: Orthopaedic Surgery

## 2020-07-04 NOTE — Telephone Encounter (Signed)
[employer] forms received. Sent to [facility]. 

## 2020-07-24 ENCOUNTER — Emergency Department (HOSPITAL_BASED_OUTPATIENT_CLINIC_OR_DEPARTMENT_OTHER)
Admission: EM | Admit: 2020-07-24 | Discharge: 2020-07-25 | Disposition: A | Discharge status: Home / Self Care | Payer: PRIVATE HEALTH INSURANCE | Attending: Emergency Medicine | Admitting: Emergency Medicine

## 2020-07-24 ENCOUNTER — Other Ambulatory Visit: Payer: Self-pay

## 2020-07-24 ENCOUNTER — Encounter (HOSPITAL_BASED_OUTPATIENT_CLINIC_OR_DEPARTMENT_OTHER): Payer: Self-pay | Admitting: *Deleted

## 2020-07-24 DIAGNOSIS — E1165 Type 2 diabetes mellitus with hyperglycemia: Secondary | ICD-10-CM | POA: Insufficient documentation

## 2020-07-24 DIAGNOSIS — F17290 Nicotine dependence, other tobacco product, uncomplicated: Secondary | ICD-10-CM | POA: Diagnosis not present

## 2020-07-24 DIAGNOSIS — Z7982 Long term (current) use of aspirin: Secondary | ICD-10-CM | POA: Diagnosis not present

## 2020-07-24 DIAGNOSIS — R339 Retention of urine, unspecified: Secondary | ICD-10-CM | POA: Insufficient documentation

## 2020-07-24 DIAGNOSIS — Z96653 Presence of artificial knee joint, bilateral: Secondary | ICD-10-CM | POA: Diagnosis not present

## 2020-07-24 DIAGNOSIS — Z79899 Other long term (current) drug therapy: Secondary | ICD-10-CM | POA: Diagnosis not present

## 2020-07-24 DIAGNOSIS — I10 Essential (primary) hypertension: Secondary | ICD-10-CM | POA: Insufficient documentation

## 2020-07-24 DIAGNOSIS — Z7984 Long term (current) use of oral hypoglycemic drugs: Secondary | ICD-10-CM | POA: Insufficient documentation

## 2020-07-24 NOTE — ED Triage Notes (Signed)
Pt had bladder surgery today, reports that he has been unable to void since that time. Pt ambulatory in triage. No distress noted.  Is noted to be tachycardiac, denies CP, SOB

## 2020-07-25 ENCOUNTER — Encounter (HOSPITAL_BASED_OUTPATIENT_CLINIC_OR_DEPARTMENT_OTHER): Payer: Self-pay | Admitting: Emergency Medicine

## 2020-07-25 LAB — CBC WITH DIFFERENTIAL/PLATELET
Abs Immature Granulocytes: 0.06 10*3/uL (ref 0.00–0.07)
Basophils Absolute: 0.1 10*3/uL (ref 0.0–0.1)
Basophils Relative: 0 %
Eosinophils Absolute: 0 10*3/uL (ref 0.0–0.5)
Eosinophils Relative: 0 %
HCT: 46.2 % (ref 39.0–52.0)
Hemoglobin: 15.8 g/dL (ref 13.0–17.0)
Immature Granulocytes: 0 %
Lymphocytes Relative: 6 %
Lymphs Abs: 0.9 10*3/uL (ref 0.7–4.0)
MCH: 31 pg (ref 26.0–34.0)
MCHC: 34.2 g/dL (ref 30.0–36.0)
MCV: 90.6 fL (ref 80.0–100.0)
Monocytes Absolute: 0.8 10*3/uL (ref 0.1–1.0)
Monocytes Relative: 5 %
Neutro Abs: 14.2 10*3/uL — ABNORMAL HIGH (ref 1.7–7.7)
Neutrophils Relative %: 89 %
Platelets: 310 10*3/uL (ref 150–400)
RBC: 5.1 MIL/uL (ref 4.22–5.81)
RDW: 12.4 % (ref 11.5–15.5)
WBC: 16 10*3/uL — ABNORMAL HIGH (ref 4.0–10.5)
nRBC: 0 % (ref 0.0–0.2)

## 2020-07-25 LAB — BASIC METABOLIC PANEL
Anion gap: 12 (ref 5–15)
BUN: 17 mg/dL (ref 8–23)
CO2: 21 mmol/L — ABNORMAL LOW (ref 22–32)
Calcium: 8.9 mg/dL (ref 8.9–10.3)
Chloride: 97 mmol/L — ABNORMAL LOW (ref 98–111)
Creatinine, Ser: 1.42 mg/dL — ABNORMAL HIGH (ref 0.61–1.24)
GFR, Estimated: 53 mL/min — ABNORMAL LOW (ref >60)
Glucose, Bld: 251 mg/dL — ABNORMAL HIGH (ref 70–99)
Potassium: 4.2 mmol/L (ref 3.5–5.1)
Sodium: 130 mmol/L — ABNORMAL LOW (ref 135–145)

## 2020-07-25 MED ORDER — LIDOCAINE HCL URETHRAL/MUCOSAL 2 % EX GEL
CUTANEOUS | Status: AC
  Administered 2020-07-25: 1 via URETHRAL
  Filled 2020-07-25: qty 11

## 2020-07-25 MED ORDER — CEPHALEXIN 250 MG PO CAPS
500.0000 mg | ORAL_CAPSULE | Freq: Once | ORAL | Status: AC
  Administered 2020-07-25: 500 mg via ORAL
  Filled 2020-07-25: qty 2

## 2020-07-25 MED ORDER — TAMSULOSIN HCL 0.4 MG PO CAPS
0.4000 mg | ORAL_CAPSULE | Freq: Every day | ORAL | 0 refills | Status: DC
Start: 2020-07-25 — End: 2021-07-10

## 2020-07-25 MED ORDER — CEPHALEXIN 500 MG PO CAPS: 500.0000 mg | ORAL_CAPSULE | Freq: Four times a day (QID) | ORAL | 0 refills | Status: DC

## 2020-07-25 MED ORDER — TAMSULOSIN HCL 0.4 MG PO CAPS
0.4000 mg | ORAL_CAPSULE | ORAL | Status: AC
  Administered 2020-07-25: 0.4 mg via ORAL
  Filled 2020-07-25: qty 1

## 2020-07-25 MED ORDER — SODIUM CHLORIDE 0.9 % IV BOLUS
500.0000 mL | Freq: Once | INTRAVENOUS | Status: AC
  Administered 2020-07-25: 500 mL via INTRAVENOUS

## 2020-07-25 MED ORDER — LIDOCAINE HCL URETHRAL/MUCOSAL 2 % EX GEL: 1.0000 "application " | Freq: Once | CUTANEOUS | Status: AC

## 2020-07-25 NOTE — ED Provider Notes (Signed)
[facility] Provider Note   CSN: [id number] Arrival date & time: 07/24/20  2340     History Chief Complaint  Patient presents with  . Urinary Retention    Charles Trujillo is a 70 y.o. male.  The history is provided by the patient.  Illness Location:  Bladder  Quality:  Unable to void since surgery this afternoon at [facility]  Severity:  Moderate Onset quality:  Gradual Timing:  Constant Progression:  Worsening Chronicity:  New Context:  Post bladder removal at [facility]  Relieved by:  Nothing  Worsened by:  Time  Ineffective treatments:  None Associated symptoms: no congestion, no cough, no fever, no loss of consciousness, no rash, no shortness of breath and no vomiting   Risk factors:  Elderly       Past Medical History:  Diagnosis Date  . Anxiety   . Arthritis    bilateral knees  . Depression   . DM2 (diabetes mellitus, type 2) ([place])   . Dysrhythmia     benign tachycardia, takes [place]  . Elevated LFTs    previously evaluated by Dr Sharlett Iles; h/o "fatty liver"  . GERD (gastroesophageal reflux disease)   . HLD (hyperlipidemia)   . Hypertension   . Inguinal hernia    right  . Squamous cell carcinoma    top of head    Patient Active Problem List   Diagnosis Date Noted  . Total knee replacement status, right 05/09/2020  . Uncontrolled type 2 diabetes mellitus with hyperglycemia ([place]) 03/14/2020  . Degenerative arthritis of right knee 03/09/2020  . Arthritis of right ankle 03/09/2020  . Pain in right ankle and joints of right foot 10/13/2019  . Total knee replacement status, left 05/18/2019  . Osteoarthritis of left knee 05/04/2019  . Unilateral primary osteoarthritis, left knee 04/20/2019  . Hyperlipidemia associated with type 2 diabetes mellitus ([place]) 05/19/2018  . Degenerative arthritis of knee, bilateral 06/20/2017  . Left inguinal pain 11/06/2015  . Dysuria 03/27/2015  . Fatigue 03/27/2015  . Benign paroxysmal  positional vertigo 04/22/2014  . Dizziness 04/22/2014  . Primary localized osteoarthrosis, lower leg 09/15/2013  . Obesity (BMI 30-39.9) 08/19/2013  . Other and unspecified hyperlipidemia 02/19/2013  . HLD (hyperlipidemia) 03/12/2012  . Tachycardia 03/06/2012  . Inguinal hernia unilateral, non-recurrent 08/02/2011  . Inguinal hernia 06/26/2011  . Preventative health care 02/01/2011  . DM (diabetes mellitus) type II uncontrolled, periph vascular disorder ([place]) 10/20/2009  . CARPAL TUNNEL SYNDROME, BILATERAL 10/03/2009  . ERECTILE DYSFUNCTION, ORGANIC, HX OF 02/06/2009  . DEPRESSION 01/20/2008  . Essential hypertension 01/20/2008  . URI 06/25/2007  . TRANSAMINASES, SERUM, ELEVATED 02/02/2007    Past Surgical History:  Procedure Laterality Date  . ACHILLES TENDON REPAIR  1999  . BLADDER SURGERY    . CARPAL TUNNEL RELEASE Right 11/04/2014   Procedure: LIMITED OPEN RIGHT CARPAL TUNNEL RELEASE;  Surgeon: Roseanne Kaufman, MD;  Location: [place];  Service: Orthopedics;  Laterality: Right;  . CARPAL TUNNEL WITH CUBITAL TUNNEL Left 07/22/2014   Procedure: LEFT CARPAL TUNNEL RELEASE AND CUBITAL TUNNEL RELEASE TRANSPOSITION;  Surgeon: Roseanne Kaufman, MD;  Location: [place];  Service: Orthopedics;  Laterality: Left;  . COLONOSCOPY    . EYE SURGERY     laser eye surgery bilat   . HERNIA REPAIR  07/2011   left side  . INGUINAL HERNIA REPAIR  08/09/2011   Procedure: LAPAROSCOPIC INGUINAL HERNIA;  Surgeon: Joyice Faster. Cornett, MD;  Location: [facility];  Service: General;  Laterality: Right;  . KNEE ARTHROSCOPY  1993   left knee  . KNEE SURGERY     age 58- right  . SQUAMOUS CELL CARCINOMA EXCISION     left knee  . TOTAL KNEE ARTHROPLASTY Left 05/04/2019   Procedure: LEFT TOTAL KNEE ARTHROPLASTY;  Surgeon: Garald Balding, MD;  Location: [facility];  Service: Orthopedics;  Laterality: Left;  . TOTAL KNEE ARTHROPLASTY Right 05/09/2020   Procedure: RIGHT TOTAL KNEE  ARTHROPLASTY;  Surgeon: Garald Balding, MD;  Location[facility];  Service: Orthopedics;  Laterality: Right;       Family History  Problem Relation Age of Onset  . Diabetes Mother   . Hypertension Mother   . Heart disease Mother   . Heart failure Mother   . Stroke Father 2  . Parkinsonism Father   . Depression Brother   . Alcohol abuse Brother   . Cancer Maternal Grandmother        bone  . Colon cancer Neg Hx   . Colon polyps Neg Hx   . Esophageal cancer Neg Hx   . Rectal cancer Neg Hx   . Stomach cancer Neg Hx     Social History   Tobacco Use  . Smoking status: Current Some Day Smoker    Types: Cigars  . Smokeless tobacco: Never Used  . Tobacco comment: smokes one every other day  Vaping Use  . Vaping Use: Never used  Substance Use Topics  . Alcohol use: Yes    Alcohol/week: 3.0 - 4.0 standard drinks    Types: 3 - 4 Cans of beer per week    Comment: social  . Drug use: No    Home Medications Prior to Admission medications   Medication Sig Start Date End Date Taking? Authorizing Provider  aspirin 81 MG chewable tablet Chew 1 tablet (81 mg total) by mouth 2 (two) times daily. 05/10/20   Cherylann Ratel, PA-C  atorvastatin (LIPITOR) 20 MG tablet Take 1 tablet (20 mg total) by mouth daily. 03/14/20   [name], DO  diltiazem (CARDIZEM CD) 120 MG 24 hr capsule Take 1 capsule (120 mg total) by mouth daily. 03/29/20   Roma Schanz R, DO  glucose blood (ONE TOUCH TEST STRIPS) test strip Check blood sugar twice daily 03/25/17   Carollee Herter, Alferd Apa, DO  HYDROmorphone (DILAUDID) 2 MG tablet Take 1-2 tablets (2-4 mg total) by mouth every 6 (six) hours as needed for moderate pain or severe pain. 05/10/20   Petrarca, Aaron Edelman D, PA-C  lisinopril (ZESTRIL) 10 MG tablet TAKE 1 TABLET BY MOUTH DAILY. Patient taking differently: Take 10 mg by mouth daily.  07/26/19   [name], DO  methocarbamol (ROBAXIN) 500 MG tablet Take 1 tablet (500 mg total) by  mouth every 8 (eight) hours as needed for muscle spasms. 05/10/20   Cherylann Ratel, PA-C  metoprolol (LOPRESSOR) 50 MG tablet Take 50 mg by mouth daily.  01/24/14   [provider]  nystatin-triamcinolone ointment (MYCOLOG) Apply 1 application topically 2 (two) times daily. 11/17/19   Gatha Mayer, MD  [facility] DELICA LANCETS FINE MISC Use qd 03/25/17   Carollee Herter, Alferd Apa, DO  SitaGLIPtin-MetFORMIN HCl (JANUMET XR) [phone] MG TB24 Take 1 tablet by mouth daily. 05/05/20   [name], DO    Allergies    Codeine  Review of Systems   Review of Systems  Constitutional: Negative for fever.  HENT:  Negative for congestion.   Eyes: Negative for visual disturbance.  Respiratory: Negative for cough and shortness of breath.   Gastrointestinal: Positive for abdominal distention. Negative for vomiting.  Genitourinary: Positive for difficulty urinating. Negative for dysuria.  Musculoskeletal: Negative for arthralgias.  Skin: Negative for rash.  Neurological: Negative for dizziness and loss of consciousness.  Psychiatric/Behavioral: Negative for agitation.  All other systems reviewed and are negative.   Physical Exam Updated Vital Signs BP 110/75 (BP Location: Left Arm)   Pulse (!) 151   Temp 98.2 F (36.8 C) (Oral)   Resp 18   Ht 5\' 7"  (1.702 m)   Wt 95.3 kg   SpO2 100%   BMI 32.89 kg/m   Physical Exam Vitals and nursing note reviewed.  Constitutional:      General: He is not in acute distress.    Appearance: Normal appearance.  HENT:     Head: Normocephalic and atraumatic.     Nose: Nose normal.  Eyes:     Conjunctiva/sclera: Conjunctivae normal.     Pupils: Pupils are equal, round, and reactive to light.  Cardiovascular:     Rate and Rhythm: Normal rate and regular rhythm.     Pulses: Normal pulses.     Heart sounds: Normal heart sounds.  Pulmonary:     Breath sounds: Normal breath sounds.  Abdominal:     General: Abdomen is flat. Bowel sounds are  normal.     Palpations: Abdomen is soft.     Tenderness: There is no abdominal tenderness. There is no guarding.  Musculoskeletal:        General: Normal range of motion.     Cervical back: Normal range of motion and neck supple.  Skin:    General: Skin is warm and dry.     Capillary Refill: Capillary refill takes less than 2 seconds.  Neurological:     General: No focal deficit present.     Mental Status: He is alert and oriented to person, place, and time.  Psychiatric:        Mood and Affect: Mood normal.        Behavior: Behavior normal.     ED Results / Procedures / Treatments   Labs (all labs ordered are listed, but only abnormal results are displayed) Results for orders placed or performed during the hospital encounter of 07/24/20  CBC with Differential/Platelet  Result Value Ref Range   WBC 16.0 (H) 4.0 - 10.5 K/uL   RBC 5.10 4.22 - 5.81 MIL/uL   Hemoglobin 15.8 13.0 - 17.0 g/dL   HCT 46.2 39.0 - 52.0 %   MCV 90.6 80.0 - 100.0 fL   MCH 31.0 26.0 - 34.0 pg   MCHC 34.2 30.0 - 36.0 g/dL   RDW 12.4 11.5 - 15.5 %   Platelets 310 150 - 400 K/uL   nRBC 0.0 0.0 - 0.2 %   Neutrophils Relative % 89 %   Neutro Abs 14.2 (H) 1.7 - 7.7 K/uL   Lymphocytes Relative 6 %   Lymphs Abs 0.9 0.7 - 4.0 K/uL   Monocytes Relative 5 %   Monocytes Absolute 0.8 0.1 - 1.0 K/uL   Eosinophils Relative 0 %   Eosinophils Absolute 0.0 0.0 - 0.5 K/uL   Basophils Relative 0 %   Basophils Absolute 0.1 0.0 - 0.1 K/uL   Immature Granulocytes 0 %   Abs Immature Granulocytes 0.06 0.00 - 0.07 K/uL  Basic metabolic panel  Result Value Ref Range   Sodium  130 (L) 135 - 145 mmol/L   Potassium 4.2 3.5 - 5.1 mmol/L   Chloride 97 (L) 98 - 111 mmol/L   CO2 21 (L) 22 - 32 mmol/L   Glucose, Bld 251 (H) 70 - 99 mg/dL   BUN 17 8 - 23 mg/dL   Creatinine, Ser 1.42 (H) 0.61 - 1.24 mg/dL   Calcium 8.9 8.9 - 10.3 mg/dL   GFR, Estimated 53 (L) >60 mL/min   Anion gap 12 5 - 15   No results  found. ' Radiology No results found.  Procedures Procedures (including critical care time)  Medications Ordered in ED Medications  cephALEXin (KEFLEX) capsule 500 mg (has no administration in time range)  tamsulosin (FLOMAX) capsule 0.4 mg (has no administration in time range)  lidocaine (XYLOCAINE) 2 % jelly 1 application (1 application Urethral Given 07/25/20 0039)    ED Course  I have reviewed the triage vital signs and the nursing notes.  Pertinent labs & imaging results that were available during my care of the patient were reviewed by me and considered in my medical decision making (see chart for details).    Case d/w on call urology resident at [facility].  Please place foley catheter and follow up in the office.     Foley placed with improvement of symptoms.  Will start keflex and flomax.  Call in am to be seen by urology.    Charles Trujillo was evaluated in Emergency Department on 07/25/2020 for the symptoms described in the history of present illness. He was evaluated in the context of the global COVID-19 pandemic, which necessitated consideration that the patient might be at risk for infection with the SARS-CoV-2 virus that causes COVID-19. Institutional protocols and algorithms that pertain to the evaluation of patients at risk for COVID-19 are in a state of rapid change based on information released by regulatory bodies including the CDC and federal and state organizations. These policies and algorithms were followed during the patient's care in the ED.  Final Clinical Impression(s) / ED Diagnoses Return for intractable cough, coughing up blood,fevers >100.4 unrelieved by medication, shortness of breath, intractable vomiting, chest pain, shortness of breath, weakness,numbness, changes in speech, facial asymmetry,abdominal pain, passing out,Inability to tolerate liquids or food, cough, altered mental status or any concerns. No signs of systemic illness or infection. The  patient is nontoxic-appearing on exam and vital signs are within normal limits.   I have reviewed the triage vital signs and the nursing notes. Pertinent labs &imaging results that were available during my care of the patient were reviewed by me and considered in my medical decision making (see chart for details).After history, exam, and medical workup I feel the patient has beenappropriately medically screened and is safe for discharge home. Pertinent diagnoses were discussed with the patient. Patient was given return precautions.     Lenon Kuennen, MD 07/25/20 2130

## 2020-07-27 ENCOUNTER — Other Ambulatory Visit: Payer: Self-pay

## 2020-07-27 ENCOUNTER — Encounter: Payer: Self-pay | Admitting: Orthopaedic Surgery

## 2020-07-27 ENCOUNTER — Ambulatory Visit (INDEPENDENT_AMBULATORY_CARE_PROVIDER_SITE_OTHER): Payer: PRIVATE HEALTH INSURANCE | Admitting: Orthopaedic Surgery

## 2020-07-27 VITALS — Ht 67.0 in | Wt 210.0 lb

## 2020-07-27 DIAGNOSIS — Z96651 Presence of right artificial knee joint: Secondary | ICD-10-CM

## 2020-07-27 DIAGNOSIS — M1711 Unilateral primary osteoarthritis, right knee: Secondary | ICD-10-CM

## 2020-07-27 NOTE — Progress Notes (Signed)
Office Visit Note   Patient: Charles Trujillo           Date of Birth: 04/21/1951           MRN: [id number] Visit Date: 07/27/2020              Requested by: [address], [place], [place] [address] [place],  [place] [address] PCP: Carollee Herter, Alferd Apa, DO   Assessment & Plan: Visit Diagnoses:  1. Primary osteoarthritis of right knee   2. Total knee replacement status, right     Plan: 2-1/2 months status post primary right total knee replacement and very happy with the results.  Walks without ambulatory aid.  No appreciable pain.  Much better than his preoperative status.  Recently had bladder tumor excised and has indwelling Foley catheter but not having any issues.  Would like to return to work on January 19 half days for 2 weeks at his desk.  Filled out the papers.  We will see him in 3 months.  Continue to work on strengthening exercises  Follow-Up Instructions: Return in about 3 months (around 10/25/2020).   Orders:  No orders of the defined types were placed in this encounter.  No orders of the defined types were placed in this encounter.     Procedures: No procedures performed   Clinical Data: No additional findings.   Subjective: Chief Complaint  Patient presents with  . Right Knee - Follow-up    Right total knee arthroplasty 05/09/2020  Patient presents today for follow up on his right knee. He had a right total knee arthroplasty on 05/09/2020. He is now 11weeks out from surgery.  Patient states that his knee is doing well. He has no complaints in regards to his knee. He did have bladder surgery this past Monday for a bladder tumor.   HPI  Review of Systems   Objective: Vital Signs: Ht 5\' 7"  (1.702 m)   Wt 210 lb (95.3 kg)   BMI 32.89 kg/m   Physical Exam  Ortho Exam right knee incision healing without a problem.  Full extension I measured over 108 degrees of flexion.  No instability.  No localized areas of tenderness.  No popliteal  pain.  No distal edema.  Neurologically intact.  Nice alignment  Specialty Comments:  No specialty comments available.  Imaging: No results found.   PMFS History: Patient Active Problem List   Diagnosis Date Noted  . Total knee replacement status, right 05/09/2020  . Uncontrolled type 2 diabetes mellitus with hyperglycemia ([place]) 03/14/2020  . Degenerative arthritis of right knee 03/09/2020  . Arthritis of right ankle 03/09/2020  . Pain in right ankle and joints of right foot 10/13/2019  . Total knee replacement status, left 05/18/2019  . Osteoarthritis of left knee 05/04/2019  . Unilateral primary osteoarthritis, left knee 04/20/2019  . Hyperlipidemia associated with type 2 diabetes mellitus ([place]) 05/19/2018  . Degenerative arthritis of knee, bilateral 06/20/2017  . Left inguinal pain 11/06/2015  . Dysuria 03/27/2015  . Fatigue 03/27/2015  . Benign paroxysmal positional vertigo 04/22/2014  . Dizziness 04/22/2014  . Primary localized osteoarthrosis, lower leg 09/15/2013  . Obesity (BMI 30-39.9) 08/19/2013  . Other and unspecified hyperlipidemia 02/19/2013  . HLD (hyperlipidemia) 03/12/2012  . Tachycardia 03/06/2012  . Inguinal hernia unilateral, non-recurrent 08/02/2011  . Inguinal hernia 06/26/2011  . Preventative health care 02/01/2011  . DM (diabetes mellitus) type II uncontrolled, periph vascular disorder ([place]) 10/20/2009  . CARPAL  TUNNEL SYNDROME, BILATERAL 10/03/2009  . ERECTILE DYSFUNCTION, ORGANIC, HX OF 02/06/2009  . DEPRESSION 01/20/2008  . Essential hypertension 01/20/2008  . URI 06/25/2007  . TRANSAMINASES, SERUM, ELEVATED 02/02/2007   Past Medical History:  Diagnosis Date  . Anxiety   . Arthritis    bilateral knees  . Depression   . DM2 (diabetes mellitus, type 2) ([place])   . Dysrhythmia     benign tachycardia, takes [place]  . Elevated LFTs    previously evaluated by Dr Sharlett Iles; h/o "fatty liver"  . GERD (gastroesophageal reflux disease)   . HLD  (hyperlipidemia)   . Hypertension   . Inguinal hernia    right  . Squamous cell carcinoma    top of head    Family History  Problem Relation Age of Onset  . Diabetes Mother   . Hypertension Mother   . Heart disease Mother   . Heart failure Mother   . Stroke Father 62  . Parkinsonism Father   . Depression Brother   . Alcohol abuse Brother   . Cancer Maternal Grandmother        bone  . Colon cancer Neg Hx   . Colon polyps Neg Hx   . Esophageal cancer Neg Hx   . Rectal cancer Neg Hx   . Stomach cancer Neg Hx     Past Surgical History:  Procedure Laterality Date  . ACHILLES TENDON REPAIR  1999  . BLADDER SURGERY    . CARPAL TUNNEL RELEASE Right 11/04/2014   Procedure: LIMITED OPEN RIGHT CARPAL TUNNEL RELEASE;  Surgeon: Roseanne Kaufman, MD;  Location: [place];  Service: Orthopedics;  Laterality: Right;  . CARPAL TUNNEL WITH CUBITAL TUNNEL Left 07/22/2014   Procedure: LEFT CARPAL TUNNEL RELEASE AND CUBITAL TUNNEL RELEASE TRANSPOSITION;  Surgeon: Roseanne Kaufman, MD;  Location: [place];  Service: Orthopedics;  Laterality: Left;  . COLONOSCOPY    . EYE SURGERY     laser eye surgery bilat   . HERNIA REPAIR  07/2011   left side  . INGUINAL HERNIA REPAIR  08/09/2011   Procedure: LAPAROSCOPIC INGUINAL HERNIA;  Surgeon: Joyice Faster. Cornett, MD;  [facility];  Service: General;  Laterality: Right;  . KNEE ARTHROSCOPY  1993   left knee  . KNEE SURGERY     age 70- right  . SQUAMOUS CELL CARCINOMA EXCISION     left knee  . TOTAL KNEE ARTHROPLASTY Left 05/04/2019   Procedure: LEFT TOTAL KNEE ARTHROPLASTY;  Surgeon: Garald Balding, MD;  [facility];  Service: Orthopedics;  Laterality: Left;  . TOTAL KNEE ARTHROPLASTY Right 05/09/2020   Procedure: RIGHT TOTAL KNEE ARTHROPLASTY;  Surgeon: Garald Balding, MD;  [facility];  Service: Orthopedics;  Laterality: Right;   Social History   Occupational History  . 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 Smoking status: Current Some Day Smoker    Types: Cigars  . Smokeless tobacco: Never Used  . Tobacco comment: smokes one every other day  Vaping Use  . Vaping Use: Never used  Substance and Sexual Activity  . Alcohol use: Yes    Alcohol/week: 3.0 - 4.0 standard drinks    Types: 3 - 4 Cans of beer per week    Comment: social  . Drug use: No  . Sexual activity: Yes    Partners: Female

## 2020-08-03 ENCOUNTER — Other Ambulatory Visit (HOSPITAL_COMMUNITY): Payer: Self-pay | Admitting: Urology

## 2020-08-03 MED FILL — TROSPIUM CHLORIDE 20 MG TAB: 20 | 10 days supply | Qty: 20 | Fill #0

## 2020-08-07 ENCOUNTER — Encounter: Payer: Self-pay | Admitting: Orthopaedic Surgery

## 2020-08-15 DIAGNOSIS — C678 Malignant neoplasm of overlapping sites of bladder: Secondary | ICD-10-CM | POA: Insufficient documentation

## 2020-08-17 ENCOUNTER — Telehealth: Payer: Self-pay | Admitting: Orthopaedic Surgery

## 2020-08-17 ENCOUNTER — Encounter: Payer: Self-pay | Admitting: Family Medicine

## 2020-08-17 DIAGNOSIS — I10 Essential (primary) hypertension: Secondary | ICD-10-CM

## 2020-08-17 MED ORDER — LISINOPRIL 10 MG PO TABS: 10.0000 mg | ORAL_TABLET | Freq: Every day | ORAL | 0 refills | Status: DC

## 2020-08-17 NOTE — Telephone Encounter (Signed)
[employer] forms received. Sent to [facility]. 

## 2020-08-31 ENCOUNTER — Telehealth: Payer: Self-pay | Admitting: Family Medicine

## 2020-08-31 ENCOUNTER — Other Ambulatory Visit: Payer: Self-pay | Admitting: Family Medicine

## 2020-08-31 ENCOUNTER — Encounter: Payer: Self-pay | Admitting: Family Medicine

## 2020-08-31 ENCOUNTER — Ambulatory Visit (INDEPENDENT_AMBULATORY_CARE_PROVIDER_SITE_OTHER): Payer: PRIVATE HEALTH INSURANCE | Admitting: Family Medicine

## 2020-08-31 ENCOUNTER — Other Ambulatory Visit: Payer: Self-pay

## 2020-08-31 VITALS — BP 110/70 | HR 71 | Temp 98.6°F | Resp 18 | Ht 67.0 in | Wt 199.8 lb

## 2020-08-31 DIAGNOSIS — E1165 Type 2 diabetes mellitus with hyperglycemia: Secondary | ICD-10-CM

## 2020-08-31 DIAGNOSIS — E1151 Type 2 diabetes mellitus with diabetic peripheral angiopathy without gangrene: Secondary | ICD-10-CM

## 2020-08-31 DIAGNOSIS — E1169 Type 2 diabetes mellitus with other specified complication: Secondary | ICD-10-CM

## 2020-08-31 DIAGNOSIS — I10 Essential (primary) hypertension: Secondary | ICD-10-CM

## 2020-08-31 DIAGNOSIS — IMO0002 Reserved for concepts with insufficient information to code with codable children: Secondary | ICD-10-CM

## 2020-08-31 DIAGNOSIS — E785 Hyperlipidemia, unspecified: Secondary | ICD-10-CM | POA: Diagnosis not present

## 2020-08-31 MED ORDER — LISINOPRIL 10 MG PO TABS: 10.0000 mg | ORAL_TABLET | Freq: Every day | ORAL | 1 refills | Status: DC

## 2020-08-31 MED ORDER — METOPROLOL TARTRATE 50 MG PO TABS: 50.0000 mg | ORAL_TABLET | Freq: Two times a day (BID) | ORAL | 1 refills | Status: DC

## 2020-08-31 MED FILL — METOPROLOL TARTRATE 50 MG T: 50 | 90 days supply | Qty: 180 | Fill #0

## 2020-08-31 NOTE — Assessment & Plan Note (Signed)
hgba1c to be checked, minimize simple carbs. Increase exercise as tolerated. Continue current meds  

## 2020-08-31 NOTE — Patient Instructions (Signed)

## 2020-08-31 NOTE — Telephone Encounter (Signed)
Pt.is returning  call he is not interested in a physical at this present time

## 2020-08-31 NOTE — Assessment & Plan Note (Signed)
Tolerating statin, encouraged heart healthy diet, avoid trans fats, minimize simple carbs and saturated fats. Increase exercise as tolerated 

## 2020-08-31 NOTE — Progress Notes (Signed)
Patient ID: Charles Trujillo, male    DOB: December 13, 1950  Age: 70 y.o. MRN: [id number]    Subjective:  Subjective  HPI Charles Trujillo presents for f/u dm, chol and bp.   He is undergoing tx for bladder cancer with urology  Review of Systems  Constitutional: Negative for appetite change, diaphoresis, fatigue and unexpected weight change.  Eyes: Negative for pain, redness and visual disturbance.  Respiratory: Negative for cough, chest tightness, shortness of breath and wheezing.   Cardiovascular: Negative for chest pain, palpitations and leg swelling.  Endocrine: Negative for cold intolerance, heat intolerance, polydipsia, polyphagia and polyuria.  Genitourinary: Negative for difficulty urinating, dysuria and frequency.  Neurological: Negative for dizziness, light-headedness, numbness and headaches.    History Past Medical History:  Diagnosis Date  . Anxiety   . Arthritis    bilateral knees  . Depression   . DM2 (diabetes mellitus, type 2) ([place])   . Dysrhythmia     benign tachycardia, takes [place]  . Elevated LFTs    previously evaluated by Dr Sharlett Iles; h/o "fatty liver"  . GERD (gastroesophageal reflux disease)   . HLD (hyperlipidemia)   . Hypertension   . Inguinal hernia    right  . Squamous cell carcinoma    top of head    He has a past surgical history that includes Knee surgery; Achilles tendon repair (1999); Squamous cell carcinoma excision; Knee arthroscopy (1993); Inguinal hernia repair (08/09/2011); Hernia repair (07/2011); Carpal tunnel with cubital tunnel (Left, 07/22/2014); Carpal tunnel release (Right, 11/04/2014); Colonoscopy; Total knee arthroplasty (Left, 05/04/2019); Eye surgery; Total knee arthroplasty (Right, 05/09/2020); and Bladder surgery.   His family history includes Alcohol abuse in his brother; Cancer in his maternal grandmother; Depression in his brother; Diabetes in his mother; Heart disease in his mother; Heart failure in his mother; Hypertension in  his mother; Parkinsonism in his father; Stroke (age of onset: 65) in his father.He reports that he has been smoking cigars. He has never used smokeless tobacco. He reports current alcohol use of about 3.0 - 4.0 standard drinks of alcohol per week. He reports that he does not use drugs.  Current Outpatient Medications on File Prior to Visit  Medication Sig Dispense Refill  . atorvastatin (LIPITOR) 20 MG tablet Take 1 tablet (20 mg total) by mouth daily. 90 tablet 1  . diltiazem (CARDIZEM CD) 120 MG 24 hr capsule Take 1 capsule (120 mg total) by mouth daily. 90 capsule 1  . glucose blood (ONE TOUCH TEST STRIPS) test strip Check blood sugar twice daily 100 each 11  . HYDROmorphone (DILAUDID) 2 MG tablet Take 1-2 tablets (2-4 mg total) by mouth every 6 (six) hours as needed for moderate pain or severe pain. 45 tablet 0  . methocarbamol (ROBAXIN) 500 MG tablet Take 1 tablet (500 mg total) by mouth every 8 (eight) hours as needed for muscle spasms. 30 tablet 0  . nystatin-triamcinolone ointment (MYCOLOG) Apply 1 application topically 2 (two) times daily. 30 g 1  . ONETOUCH DELICA LANCETS FINE MISC Use qd 100 each 3  . SitaGLIPtin-MetFORMIN HCl (JANUMET XR) [phone] MG TB24 Take 1 tablet by mouth daily. 90 tablet 1  . tamsulosin (FLOMAX) 0.4 MG CAPS capsule Take 1 capsule (0.4 mg total) by mouth daily. 30 capsule 0   No current facility-administered medications on file prior to visit.     Objective:  Objective  Physical Exam Vitals and nursing note reviewed.  Constitutional:      General: He is sleeping.  Vital signs are normal.     Appearance: He is well-developed and well-nourished.  HENT:     Head: Normocephalic and atraumatic.     Mouth/Throat:     Mouth: Oropharynx is clear and moist.  Eyes:     Extraocular Movements: EOM normal.     Pupils: Pupils are equal, round, and reactive to light.  Neck:     Thyroid: No thyromegaly.  Cardiovascular:     Rate and Rhythm: Normal rate and regular  rhythm.     Heart sounds: No murmur heard.   Pulmonary:     Effort: Pulmonary effort is normal. No respiratory distress.     Breath sounds: Normal breath sounds. No wheezing or rales.  Chest:     Chest wall: No tenderness.  Musculoskeletal:        General: No tenderness or edema.     Cervical back: Normal range of motion and neck supple.  Skin:    General: Skin is warm and dry.  Neurological:     Mental Status: He is oriented to person, place, and time.  Psychiatric:        Mood and Affect: Mood and affect normal.        Behavior: Behavior normal.        Thought Content: Thought content normal.        Judgment: Judgment normal.    BP 110/70 (BP Location: Right Arm, Patient Position: Sitting, Cuff Size: Large)   Pulse 71   Temp 98.6 F (37 C) (Oral)   Resp 18   Ht 5\' 7"  (1.702 m)   Wt 199 lb 12.8 oz (90.6 kg)   SpO2 95%   BMI 31.29 kg/m  Wt Readings from Last 3 Encounters:  08/31/20 199 lb 12.8 oz (90.6 kg)  07/27/20 210 lb (95.3 kg)  07/24/20 210 lb (95.3 kg)     Lab Results  Component Value Date   WBC 16.0 (H) 07/25/2020   HGB 15.8 07/25/2020   HCT 46.2 07/25/2020   PLT 310 07/25/2020   GLUCOSE 251 (H) 07/25/2020   CHOL 144 03/14/2020   TRIG 59 03/14/2020   HDL 57 03/14/2020   LDLDIRECT 143.5 02/01/2011   LDLCALC 73 03/14/2020   ALT 61 (H) 05/01/2020   AST 55 (H) 05/01/2020   NA 130 (L) 07/25/2020   K 4.2 07/25/2020   CL 97 (L) 07/25/2020   CREATININE 1.42 (H) 07/25/2020   BUN 17 07/25/2020   CO2 21 (L) 07/25/2020   TSH 5.50 (H) 12/09/2017   PSA 0.33 04/03/2012   INR 1.2 05/01/2020   HGBA1C 6.6 (H) 05/09/2020   MICROALBUR 6.2 03/14/2020    No results found.   Assessment & Plan:  Plan  I have discontinued Lorne Skeens. [place] "BILL"'s aspirin and cephALEXin. I have also changed his metoprolol tartrate. Additionally, I am having him maintain his glucose blood, OneTouch Delica Lancets Fine, nystatin-triamcinolone ointment, atorvastatin, diltiazem,  Janumet XR, HYDROmorphone, methocarbamol, tamsulosin, and lisinopril.  Meds ordered this encounter  Medications  . lisinopril (ZESTRIL) 10 MG tablet    Sig: Take 1 tablet (10 mg total) by mouth daily. Pt needs office visit for more refills    Dispense:  90 tablet    Refill:  1  . metoprolol tartrate (LOPRESSOR) 50 MG tablet    Sig: Take 1 tablet (50 mg total) by mouth 2 (two) times daily.    Dispense:  180 tablet    Refill:  1    Problem List Items Addressed  This Visit      Unprioritized   DM (diabetes mellitus) type II uncontrolled, periph vascular disorder ([place])    hgba1c to be checked , minimize simple carbs. Increase exercise as tolerated. Continue current meds       Relevant Medications   lisinopril (ZESTRIL) 10 MG tablet   metoprolol tartrate (LOPRESSOR) 50 MG tablet   Essential hypertension    Well controlled, no changes to meds. Encouraged heart healthy diet such as the DASH diet and exercise as tolerated.       Relevant Medications   lisinopril (ZESTRIL) 10 MG tablet   metoprolol tartrate (LOPRESSOR) 50 MG tablet   Other Relevant Orders   Lipid panel   Hemoglobin A1c   Comprehensive metabolic panel   Microalbumin / creatinine urine ratio   HLD (hyperlipidemia)    Tolerating statin, encouraged heart healthy diet, avoid trans fats, minimize simple carbs and saturated fats. Increase exercise as tolerated      Relevant Medications   lisinopril (ZESTRIL) 10 MG tablet   metoprolol tartrate (LOPRESSOR) 50 MG tablet   Hyperlipidemia associated with type 2 diabetes mellitus ([place])    Tolerating statin, encouraged heart healthy diet, avoid trans fats, minimize simple carbs and saturated fats. Increase exercise as tolerated      Relevant Medications   lisinopril (ZESTRIL) 10 MG tablet   Other Relevant Orders   Lipid panel   Hemoglobin A1c   Comprehensive metabolic panel   Microalbumin / creatinine urine ratio   Uncontrolled type 2 diabetes mellitus with  hyperglycemia ([facility]) - Primary   Relevant Medications   lisinopril (ZESTRIL) 10 MG tablet   Other Relevant Orders   Lipid panel   Hemoglobin A1c   Comprehensive metabolic panel   Microalbumin / creatinine urine ratio      Follow-up: Return in about 6 months (around 02/28/2021), or if symptoms worsen or fail to improve, for annual exam, fasting.  [name], DO

## 2020-08-31 NOTE — Assessment & Plan Note (Signed)
Well controlled, no changes to meds. Encouraged heart healthy diet such as the DASH diet and exercise as tolerated.  °

## 2020-09-01 ENCOUNTER — Other Ambulatory Visit (HOSPITAL_COMMUNITY): Payer: Self-pay | Admitting: Nurse Practitioner

## 2020-09-01 LAB — LIPID PANEL
Cholesterol: 122 mg/dL (ref 0–200)
HDL: 50.8 mg/dL (ref >39.00)
LDL Cholesterol: 58 mg/dL (ref 0–99)
NonHDL: 71.07
Total CHOL/HDL Ratio: 2
Triglycerides: 67 mg/dL (ref 0.0–149.0)
VLDL: 13.4 mg/dL (ref 0.0–40.0)

## 2020-09-01 LAB — COMPREHENSIVE METABOLIC PANEL
ALT: 26 U/L (ref 0–53)
AST: 25 U/L (ref 0–37)
Albumin: 3.8 g/dL (ref 3.5–5.2)
Alkaline Phosphatase: 47 U/L (ref 39–117)
BUN: 14 mg/dL (ref 6–23)
CO2: 28 mEq/L (ref 19–32)
Calcium: 9 mg/dL (ref 8.4–10.5)
Chloride: 108 mEq/L (ref 96–112)
Creatinine, Ser: 1.38 mg/dL (ref 0.40–1.50)
GFR: 51.57 mL/min — ABNORMAL LOW (ref >60.00)
Glucose, Bld: 122 mg/dL — ABNORMAL HIGH (ref 70–99)
Potassium: 4.3 mEq/L (ref 3.5–5.1)
Sodium: 141 mEq/L (ref 135–145)
Total Bilirubin: 0.4 mg/dL (ref 0.2–1.2)
Total Protein: 6.4 g/dL (ref 6.0–8.3)

## 2020-09-01 LAB — MICROALBUMIN / CREATININE URINE RATIO
Creatinine,U: 189.9 mg/dL
Microalb Creat Ratio: 15.5 mg/g (ref 0.0–30.0)
Microalb, Ur: 29.4 mg/dL — ABNORMAL HIGH (ref 0.0–1.9)

## 2020-09-01 LAB — HEMOGLOBIN A1C: Hgb A1c MFr Bld: 6.6 % — ABNORMAL HIGH (ref 4.6–6.5)

## 2020-09-01 MED FILL — ATORVASTATIN 40 MG TABLET: 40 | 90 days supply | Qty: 90 | Fill #0

## 2020-09-22 MED FILL — LISINOPRIL 10 MG TABS: 10 | 90 days supply | Qty: 90 | Fill #0

## 2020-09-29 ENCOUNTER — Other Ambulatory Visit (HOSPITAL_COMMUNITY): Payer: Self-pay | Admitting: Nurse Practitioner

## 2020-10-26 ENCOUNTER — Ambulatory Visit: Payer: PRIVATE HEALTH INSURANCE | Admitting: Orthopaedic Surgery

## 2020-11-10 ENCOUNTER — Other Ambulatory Visit: Payer: Self-pay | Admitting: Family Medicine

## 2020-12-07 ENCOUNTER — Other Ambulatory Visit (HOSPITAL_COMMUNITY): Payer: Self-pay

## 2020-12-07 MED ORDER — PHENAZOPYRIDINE HCL 95 MG PO TABS: ORAL_TABLET | ORAL | 0 refills | Status: DC

## 2020-12-07 MED ORDER — BACLOFEN 10 MG PO TABS
10.0000 mg | ORAL_TABLET | Freq: Three times a day (TID) | ORAL | 0 refills | Status: DC | PRN
  Filled 2020-12-07: qty 45, 15d supply, fill #0

## 2020-12-13 ENCOUNTER — Other Ambulatory Visit (HOSPITAL_COMMUNITY): Payer: Self-pay

## 2020-12-13 MED ORDER — URIBEL 118 MG PO CAPS
ORAL_CAPSULE | ORAL | 1 refills | Status: DC
  Filled 2020-12-13: qty 40, 10d supply, fill #0
  Filled 2021-01-19: qty 40, 10d supply, fill #1

## 2020-12-19 ENCOUNTER — Other Ambulatory Visit (HOSPITAL_COMMUNITY): Payer: Self-pay

## 2020-12-19 MED ORDER — ONDANSETRON HCL 4 MG PO TABS
ORAL_TABLET | ORAL | 0 refills | Status: DC
  Filled 2020-12-19: qty 20, 6d supply, fill #0

## 2020-12-21 ENCOUNTER — Other Ambulatory Visit (HOSPITAL_COMMUNITY): Payer: Self-pay

## 2020-12-25 ENCOUNTER — Other Ambulatory Visit (HOSPITAL_COMMUNITY): Payer: Self-pay

## 2020-12-25 MED ORDER — DILTIAZEM HCL ER COATED BEADS 120 MG PO CP24
120.0000 mg | ORAL_CAPSULE | Freq: Every day | ORAL | 3 refills | Status: DC
  Filled 2020-12-25: qty 30, 30d supply, fill #0

## 2020-12-25 MED ORDER — NEPHRO-VITE 0.8 MG PO TABS: 1.0000 | ORAL_TABLET | Freq: Every day | ORAL | 1 refills | Status: DC

## 2020-12-25 MED ORDER — FLAVOXATE HCL 100 MG PO TABS
ORAL_TABLET | ORAL | 1 refills | Status: DC
  Filled 2020-12-25: qty 90, 30d supply, fill #0
  Filled 2021-01-18 – 2021-01-19 (×4): qty 90, 30d supply, fill #1

## 2020-12-25 MED ORDER — JANUMET XR 50-500 MG PO TB24
ORAL_TABLET | ORAL | 1 refills | Status: DC
  Filled 2020-12-25: qty 30, 30d supply, fill #0
  Filled 2021-01-16 – 2021-01-17 (×2): qty 30, 30d supply, fill #1

## 2020-12-26 ENCOUNTER — Other Ambulatory Visit (HOSPITAL_COMMUNITY): Payer: Self-pay

## 2020-12-27 ENCOUNTER — Other Ambulatory Visit (HOSPITAL_COMMUNITY): Payer: Self-pay

## 2021-01-02 ENCOUNTER — Other Ambulatory Visit (HOSPITAL_COMMUNITY): Payer: Self-pay

## 2021-01-02 MED ORDER — CIPROFLOXACIN HCL 500 MG PO TABS
ORAL_TABLET | ORAL | 0 refills | Status: DC
  Filled 2021-01-02: qty 20, 10d supply, fill #0

## 2021-01-11 ENCOUNTER — Other Ambulatory Visit (HOSPITAL_COMMUNITY): Payer: Self-pay

## 2021-01-11 MED FILL — Diltiazem HCl Coated Beads Cap ER 24HR 120 MG: ORAL | 90 days supply | Qty: 90 | Fill #0 | Status: AC

## 2021-01-11 MED FILL — Metoprolol Tartrate Tab 50 MG: ORAL | 90 days supply | Qty: 180 | Fill #0 | Status: CN

## 2021-01-11 MED FILL — Atorvastatin Calcium Tab 40 MG (Base Equivalent): ORAL | 90 days supply | Qty: 90 | Fill #0 | Status: CN

## 2021-01-11 MED FILL — Diltiazem HCl Coated Beads Cap ER 24HR 120 MG: ORAL | 90 days supply | Qty: 90 | Fill #0 | Status: CN

## 2021-01-16 ENCOUNTER — Other Ambulatory Visit (HOSPITAL_COMMUNITY): Payer: Self-pay

## 2021-01-19 ENCOUNTER — Other Ambulatory Visit (HOSPITAL_COMMUNITY): Payer: Self-pay

## 2021-01-23 ENCOUNTER — Other Ambulatory Visit (HOSPITAL_COMMUNITY): Payer: Self-pay

## 2021-01-31 ENCOUNTER — Other Ambulatory Visit (HOSPITAL_COMMUNITY): Payer: Self-pay

## 2021-02-23 ENCOUNTER — Encounter: Payer: Self-pay | Admitting: Family Medicine

## 2021-02-23 ENCOUNTER — Other Ambulatory Visit: Payer: Self-pay

## 2021-02-23 ENCOUNTER — Ambulatory Visit: Payer: No Typology Code available for payment source | Admitting: Family Medicine

## 2021-02-23 VITALS — BP 120/80 | HR 73 | Temp 98.5°F | Resp 18 | Ht 67.0 in | Wt 185.0 lb

## 2021-02-23 DIAGNOSIS — I10 Essential (primary) hypertension: Secondary | ICD-10-CM | POA: Diagnosis not present

## 2021-02-23 DIAGNOSIS — IMO0002 Reserved for concepts with insufficient information to code with codable children: Secondary | ICD-10-CM

## 2021-02-23 DIAGNOSIS — E785 Hyperlipidemia, unspecified: Secondary | ICD-10-CM

## 2021-02-23 DIAGNOSIS — E1151 Type 2 diabetes mellitus with diabetic peripheral angiopathy without gangrene: Secondary | ICD-10-CM | POA: Diagnosis not present

## 2021-02-23 DIAGNOSIS — E1165 Type 2 diabetes mellitus with hyperglycemia: Secondary | ICD-10-CM

## 2021-02-23 DIAGNOSIS — E1169 Type 2 diabetes mellitus with other specified complication: Secondary | ICD-10-CM

## 2021-02-23 LAB — LIPID PANEL
Cholesterol: 125 mg/dL (ref 0–200)
HDL: 49.9 mg/dL (ref >39.00)
LDL Cholesterol: 64 mg/dL (ref 0–99)
NonHDL: 74.91
Total CHOL/HDL Ratio: 3
Triglycerides: 55 mg/dL (ref 0.0–149.0)
VLDL: 11 mg/dL (ref 0.0–40.0)

## 2021-02-23 LAB — COMPREHENSIVE METABOLIC PANEL
ALT: 20 U/L (ref 0–53)
AST: 28 U/L (ref 0–37)
Albumin: 3.6 g/dL (ref 3.5–5.2)
Alkaline Phosphatase: 47 U/L (ref 39–117)
BUN: 10 mg/dL (ref 6–23)
CO2: 29 mEq/L (ref 19–32)
Calcium: 9 mg/dL (ref 8.4–10.5)
Chloride: 104 mEq/L (ref 96–112)
Creatinine, Ser: 1.07 mg/dL (ref 0.40–1.50)
GFR: 69.74 mL/min (ref >60.00)
Glucose, Bld: 117 mg/dL — ABNORMAL HIGH (ref 70–99)
Potassium: 4.3 mEq/L (ref 3.5–5.1)
Sodium: 139 mEq/L (ref 135–145)
Total Bilirubin: 0.6 mg/dL (ref 0.2–1.2)
Total Protein: 6.8 g/dL (ref 6.0–8.3)

## 2021-02-23 LAB — HEMOGLOBIN A1C: Hgb A1c MFr Bld: 5.5 % (ref 4.6–6.5)

## 2021-02-23 MED ORDER — BLOOD GLUCOSE MONITOR KIT: PACK | 0 refills | Status: DC

## 2021-02-23 NOTE — Progress Notes (Signed)
Subjective:   By signing my name below, I, [name], attest that this documentation has been prepared under the direction and in the presence of [name], DO. 02/23/2021   Patient ID: Charles Trujillo, male    DOB: [birth year], 70 y.o.   MRN: [id number]  Chief Complaint  Patient presents with   Diabetes   Follow-up    HPI Patient is in today for an office visit to discuss some medical problems he has had over the past 3 months.  He was admitted to the hospital on 12/20/2020 for an acute kidney injury.  He mentions that he was told he has sleep apnea while he was admitted but thinks he does not. He believes it is due to all the medications he was taking at the hospital.   He has not been checking his blood sugar regularly and would like to be prescribed a meter. It was last checked when he was admitted to the hospital.  He saw his urology yesterday for a cystoscopy and reports everything was normal. He mentions that he can sleep for an hour now without going to the toilet which is an improvement from going every 15 minutes.  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  He sees Charlann Boxer.   He has not been managing a healthy diet but mentions he lost over 30 pounds while he was admitted. He mentions his appetite is better and he has been trying to eat more.  Past Medical History:  Diagnosis Date   Anxiety    Arthritis    bilateral knees   Depression    DM2 (diabetes mellitus, type 2) ([facility])    Dysrhythmia     benign tachycardia, takes [place]   Elevated LFTs    previously evaluated by Dr Sharlett Iles; h/o "fatty liver"   GERD (gastroesophageal reflux disease)    HLD (hyperlipidemia)    Hypertension    Inguinal hernia    right   Squamous cell carcinoma    top of head    Past Surgical History:  Procedure Laterality Date   ACHILLES TENDON REPAIR  1999   BLADDER SURGERY     CARPAL TUNNEL RELEASE  Right 11/04/2014   Procedure: LIMITED OPEN RIGHT CARPAL TUNNEL RELEASE;  Surgeon: Roseanne Kaufman, MD;  Location: [facility];  Service: Orthopedics;  Laterality: Right;   CARPAL TUNNEL WITH CUBITAL TUNNEL Left 07/22/2014   Procedure: LEFT CARPAL TUNNEL RELEASE AND CUBITAL TUNNEL RELEASE TRANSPOSITION;  Surgeon: Roseanne Kaufman, MD;  Location: [facility];  Service: Orthopedics;  Laterality: Left;   COLONOSCOPY     EYE SURGERY     laser eye surgery bilat    HERNIA REPAIR  07/2011   left side   INGUINAL HERNIA REPAIR  08/09/2011   Procedure: LAPAROSCOPIC INGUINAL HERNIA;  Surgeon: Joyice Faster. Cornett, MD;  Location: [facility];  Service: General;  Laterality: Right;   KNEE ARTHROSCOPY  1993   left knee   KNEE SURGERY     age 63- right   SQUAMOUS CELL CARCINOMA EXCISION     left knee   TOTAL KNEE ARTHROPLASTY Left 05/04/2019   Procedure: LEFT TOTAL KNEE ARTHROPLASTY;  Surgeon: Garald Balding, MD;  Location: [facility];  Service: Orthopedics;  Laterality: Left;   TOTAL KNEE ARTHROPLASTY Right 05/09/2020   Procedure: RIGHT TOTAL KNEE ARTHROPLASTY;  Surgeon: Garald Balding, MD;  Location: [facility];  Service:  Orthopedics;  Laterality: Right;    Family History  Problem Relation Age of Onset   Diabetes Mother    Hypertension Mother    Heart disease Mother    Heart failure Mother    Stroke Father 16   Parkinsonism Father    Depression Brother    Alcohol abuse Brother    Cancer Maternal Grandmother        bone   Colon cancer Neg Hx    Colon polyps Neg Hx    Esophageal cancer Neg Hx    Rectal cancer Neg Hx    Stomach cancer Neg Hx     Social History   Socioeconomic History   Marital status: Married    Spouse name: Not on file   Number of children: 1   Years of education: Not on file   Highest education level: Not on file  Occupational History   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, fast food: [place]  Tobacco Use    Smoking status: Some Days    Types: Cigars   Smokeless tobacco: Never   Tobacco comments:    smokes one every other day  Vaping Use   Vaping Use: Never used  Substance and Sexual Activity   Alcohol use: Yes    Alcohol/week: 3.0 - 4.0 standard drinks    Types: 3 - 4 Cans of beer per week    Comment: social   Drug use: No   Sexual activity: Yes    Partners: Female  Other Topics Concern   Not on file  Social History Narrative   Married to [place]   exercise--- not regularly   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 90 tablet 1   atorvastatin (LIPITOR) 40 MG tablet TAKE 1 TABLET BY MOUTH DAILY. 90 tablet 3   b complex-vitamin c-folic acid (NEPHRO-VITE) 0.8 MG TABS tablet Take 1 tablet by mouth daily. 30 tablet 1   diltiazem (CARDIZEM CD) 120 MG 24 hr capsule Take 1 capsule (120 mg total) by mouth daily. 90 capsule 1   diltiazem (CARDIZEM CD) 120 MG 24 hr capsule TAKE 1 CAPSULE BY MOUTH ONCE A DAY 90 capsule 3   diltiazem (CARDIZEM CD) 120 MG 24 hr capsule TAKE 1 CAPSULE BY MOUTH ONCE A DAY 90 capsule 3   diltiazem (CARDIZEM CD) 120 MG 24 hr capsule Take 1 capsule (120 mg total) by mouth daily. 90 capsule 3   flavoxATE (URISPAS) 100 MG tablet Take 1 tablet (100 mg total) by mouth 3 times daily. 90 tablet 1   glucose blood (ONE TOUCH TEST STRIPS) test strip Check blood sugar twice daily 100 each 11   HYDROmorphone (DILAUDID) 2 MG tablet Take 1-2 tablets (2-4 mg total) by mouth every 6 (six) hours as  needed for moderate pain or severe pain. 45 tablet 0   lisinopril (ZESTRIL) 10 MG tablet TAKE 1 TABLET BY MOUTH DAILY * NEEDS OFFICE VISIT 90 tablet  1   methocarbamol (ROBAXIN) 500 MG tablet TAKE 1 TABLET BY MOUTH EVERY 8 HOURS AS NEEDED FOR MUSCLE SPASMS 30 tablet 0   metoprolol tartrate (LOPRESSOR) 50 MG tablet TAKE 1 TABLET BY MOUTH 2 TIMES DAILY 180 tablet 3   metoprolol tartrate (LOPRESSOR) 50 MG tablet TAKE 1 TABLET BY MOUTH 2 TIMES DAILY 180 tablet 1   nystatin-triamcinolone ointment (MYCOLOG) Apply 1 application topically 2 (two) times daily. 30 g 1   ONETOUCH DELICA LANCETS FINE MISC Use qd 100 each 3   phenazopyridine (PYRIDIUM) 95 MG tablet Take 1 tablet (95 mg total) by mouth 3 (three) times daily as needed for Pain. 10 tablet 0   SitaGLIPtin-MetFORMIN HCl (JANUMET XR) 50-500 MG TB24 Take 1 tablet by mouth daily for 30 days. Continue lower dosage until told to resume normal dose by nephrology or Dr. Emeterio Reeve 30 tablet 1   tamsulosin (FLOMAX) 0.4 MG CAPS capsule Take 1 capsule (0.4 mg total) by mouth daily. 30 capsule 0   trospium (SANCTURA) 20 MG tablet TAKE 1 TABLET BY MOUTH 2 TIMES DAILY FOR 10 DAYS 20 tablet 0   cephALEXin (KEFLEX) 500 MG capsule TAKE 1 CAPSULE BY MOUTH 4 TIMES DAILY FOR 10 DAYS. (Patient not taking: Reported on 02/23/2021) 40 capsule 0   ciprofloxacin (CIPRO) 500 MG tablet Take 1 tablet (500 mg total) by mouth 2 times daily for 10 days. 20 tablet 0   JANUMET XR [phone] MG TB24 Take 1 tablet by mouth daily. 90 tablet 1   No facility-administered medications prior to visit.    Allergies  Allergen Reactions   Meth-Hyo-M Bl-Na Phos-Ph Sal Other (See Comments)    [place] believes it may have contributed to renal failure accourding to his spouse who informed us at 17:17 01/19/2021   Codeine Nausea Only    Review of Systems  Constitutional:  Negative for fever and malaise/fatigue.  HENT:  Negative for congestion.   Eyes:  Negative for blurred vision.   Respiratory:  Negative for cough and shortness of breath.   Cardiovascular:  Negative for chest pain, palpitations and leg swelling.  Gastrointestinal:  Negative for vomiting.  Musculoskeletal:  Positive for joint pain (ankle pain due to arthritis). Negative for back pain.  Skin:  Negative for rash.  Neurological:  Negative for loss of consciousness and headaches.      Objective:    Physical Exam Vitals and nursing note reviewed.  Constitutional:      General: He is not in acute distress.    Appearance: Normal appearance. He is not ill-appearing.  HENT:     Head: Normocephalic and atraumatic.     Right Ear: Tympanic membrane, ear canal and external ear normal.     Left Ear: Tympanic membrane, ear canal and external ear normal.  Eyes:     Pupils: Pupils are equal, round, and reactive to light.  Cardiovascular:     Rate and Rhythm: Normal rate and regular rhythm.     Pulses: Normal pulses.     Heart sounds: No murmur heard.   No gallop.  Pulmonary:     Effort: Pulmonary effort is normal. No respiratory distress.     Breath sounds: Normal breath sounds. No wheezing or rhonchi.  Abdominal:     General: Bowel sounds are normal. There is no distension.     Palpations: Abdomen is soft.     Tenderness: There is no abdominal tenderness. There is no guarding.     Hernia: No hernia is present.  Musculoskeletal:     Cervical back: Neck supple.  Lymphadenopathy:     Cervical: No cervical adenopathy.  Skin:    General: Skin is warm and dry.  Neurological:     Mental Status: He is alert and oriented to person, place, and time.    BP 120/80 (BP Location: Right Arm, Patient Position: Sitting, Cuff Size: Normal)   Pulse 73   Temp 98.5 F (36.9 C) (Oral)   Resp 18   Ht _0  (1.702 m)   Wt 185 lb (83.9 kg)   SpO2 97%   BMI 28.98 kg/m  Wt Readings from Last 3 Encounters:  02/23/21 185 lb (83.9 kg)  08/31/20 199 lb 12.8 oz (90.6 kg)  07/27/20 210 lb (95.3 kg)    Diabetic Foot  Exam - Simple   No data filed    Lab Results  Component Value Date   WBC 16.0 (H) 07/25/2020   HGB 15.8 07/25/2020   HCT 46.2 07/25/2020   PLT 310 07/25/2020   GLUCOSE 122 (H) 08/31/2020   CHOL 122 08/31/2020   TRIG 67.0 08/31/2020   HDL 50.80 08/31/2020   LDLDIRECT 143.5 02/01/2011   LDLCALC 58 08/31/2020   ALT 26 08/31/2020   AST 25 08/31/2020   NA 141 08/31/2020   K 4.3 08/31/2020   CL 108 08/31/2020   CREATININE 1.38 08/31/2020   BUN 14 08/31/2020   CO2 28 08/31/2020   TSH 5.50 (H) 12/09/2017   PSA 0.33 04/03/2012   INR 1.2 05/01/2020   HGBA1C 6.6 (H) 08/31/2020   MICROALBUR 29.4 (H) 08/31/2020    Lab Results  Component Value Date   TSH 5.50 (H) 12/09/2017   Lab Results  Component Value Date   WBC 16.0 (H) 07/25/2020   HGB 15.8 07/25/2020   HCT 46.2 07/25/2020   MCV 90.6 07/25/2020   PLT 310 07/25/2020   Lab Results  Component Value Date   NA 141 08/31/2020   K 4.3 08/31/2020   CO2 28 08/31/2020   GLUCOSE 122 (H) 08/31/2020   BUN 14 08/31/2020   CREATININE 1.38 08/31/2020   BILITOT 0.4 08/31/2020   ALKPHOS 47 08/31/2020   AST 25 08/31/2020   ALT 26 08/31/2020   PROT 6.4 08/31/2020   ALBUMIN 3.8 08/31/2020   CALCIUM 9.0 08/31/2020   ANIONGAP 12 07/25/2020   GFR 51.57 (L) 08/31/2020   Lab Results  Component Value Date   CHOL 122 08/31/2020   Lab Results  Component Value Date   HDL 50.80 08/31/2020   Lab Results  Component Value Date   LDLCALC 58 08/31/2020   Lab Results  Component Value Date   TRIG 67.0 08/31/2020   Lab Results  Component Value Date   CHOLHDL 2 08/31/2020   Lab Results  Component Value Date   HGBA1C 6.6 (H) 08/31/2020       Assessment & Plan:   Problem List Items Addressed This Visit       Unprioritized   DM (diabetes mellitus) type II uncontrolled, periph vascular disorder ([place])    Check labs today hgba1c to be checked, minimize simple carbs. Increase exercise as tolerated. Continue current meds        Essential hypertension    Well controlled, no changes to meds. Encouraged heart healthy diet such as the DASH diet and exercise as tolerated.       Hyperlipidemia associated with type 2 diabetes mellitus ([place])    Encourage heart healthy diet such as MIND or DASH diet, increase  exercise, avoid trans fats, simple carbohydrates and processed foods, consider a krill or fish or flaxseed oil cap daily.       Other Visit Diagnoses     Type 2 diabetes mellitus with hyperglycemia, unspecified whether long term insulin use (H[facility])    -  Primary   Relevant Medications   blood glucose meter kit and supplies KIT   Other Relevant Orders   Lipid panel   Hemoglobin A1c   Comprehensive metabolic panel   Primary hypertension       Relevant Orders   Lipid panel   Hemoglobin A1c   Comprehensive metabolic panel       Meds ordered this encounter  Medications   blood glucose meter kit and supplies KIT    Sig: Dispense based on patient and insurance preference. Use up to four times daily as directed.    Dispense:  1 each    Refill:  0    Order Specific Question:   Number of strips    Answer:   100    Order Specific Question:   Number of lancets    Answer:   100    I,[name],acting as a scribe for [name], DO.,have documented all relevant documentation on the behalf of [name], DO,as directed by  [name], DO while in the presence of [name], DO.   I, [name], DO. , personally preformed the services described in this documentation.  All medical record entries made by the scribe were at my direction and in my presence.  I have reviewed the chart and discharge instructions (if applicable) and agree that the record reflects my personal performance and is accurate and complete. 02/23/2021

## 2021-02-23 NOTE — Assessment & Plan Note (Signed)
Well controlled, no changes to meds. Encouraged heart healthy diet such as the DASH diet and exercise as tolerated.  °

## 2021-02-23 NOTE — Patient Instructions (Signed)
https://www.diabeteseducator.org/docs/default-source/living-with-diabetes/conquering-the-grocery-store-v1.pdf?sfvrsn=4">  Carbohydrate Counting for Diabetes Mellitus, Adult Carbohydrate counting is a method of keeping track of how many carbohydrates you eat. Eating carbohydrates naturally increases the amount of sugar (glucose) in the blood. Counting how many carbohydrates you eat improves your bloodglucose control, which helps you manage your diabetes. It is important to know how many carbohydrates you can safely have in each meal. This is different for every person. A dietitian can help you make a meal plan and calculate how many carbohydrates you should have at each meal andsnack. What foods contain carbohydrates? Carbohydrates are found in the following foods: Grains, such as breads and cereals. Dried beans and soy products. Starchy vegetables, such as potatoes, peas, and corn. Fruit and fruit juices. Milk and yogurt. Sweets and snack foods, such as cake, cookies, candy, chips, and soft drinks. How do I count carbohydrates in foods? There are two ways to count carbohydrates in food. You can read food labels or learn standard serving sizes of foods. You can use either of the methods or acombination of both. Using the Nutrition Facts label The Nutrition Facts list is included on the labels of almost all packaged foods and beverages in the [place] It includes: The serving size. Information about nutrients in each serving, including the grams (g) of carbohydrate per serving. To use the Nutrition Facts: Decide how many servings you will have. Multiply the number of servings by the number of carbohydrates per serving. The resulting number is the total amount of carbohydrates that you will be having. Learning the standard serving sizes of foods When you eat carbohydrate foods that are not packaged or do not include Nutrition Facts on the label, you need to measure the servings in order to count the  amount of carbohydrates. Measure the foods that you will eat with a food scale or measuring cup, if needed. Decide how many standard-size servings you will eat. Multiply the number of servings by 15. For foods that contain carbohydrates, one serving equals 15 g of carbohydrates. For example, if you eat 2 cups or 10 oz (300 g) of strawberries, you will have eaten 2 servings and 30 g of carbohydrates (2 servings x 15 g = 30 g). For foods that have more than one food mixed, such as soups and casseroles, you must count the carbohydrates in each food that is included. The following list contains standard serving sizes of common carbohydrate-rich foods. Each of these servings has about 15 g of carbohydrates: 1 slice of bread. 1 six-inch (15 cm) tortilla. ? cup or 2 oz (53 g) cooked rice or pasta.  cup or 3 oz (85 g) cooked or canned, drained and rinsed beans or lentils.  cup or 3 oz (85 g) starchy vegetable, such as peas, corn, or squash.  cup or 4 oz (120 g) hot cereal.  cup or 3 oz (85 g) boiled or mashed potatoes, or  or 3 oz (85 g) of a large baked potato.  cup or 4 fl oz (118 mL) fruit juice. 1 cup or 8 fl oz (237 mL) milk. 1 small or 4 oz (106 g) apple.  or 2 oz (63 g) of a medium banana. 1 cup or 5 oz (150 g) strawberries. 3 cups or 1 oz (24 g) popped popcorn. What is an example of carbohydrate counting? To calculate the number of carbohydrates in this sample meal, follow the stepsshown below. Sample meal 3 oz (85 g) chicken breast. ? cup or 4 oz (106 g) brown rice.    cup or 3 oz (85 g) corn. 1 cup or 8 fl oz (237 mL) milk. 1 cup or 5 oz (150 g) strawberries with sugar-free whipped topping. Carbohydrate calculation Identify the foods that contain carbohydrates: Rice. Corn. Milk. Strawberries. Calculate how many servings you have of each food: 2 servings rice. 1 serving corn. 1 serving milk. 1 serving strawberries. Multiply each number of servings by 15 g: 2 servings  rice x 15 g = 30 g. 1 serving corn x 15 g = 15 g. 1 serving milk x 15 g = 15 g. 1 serving strawberries x 15 g = 15 g. Add together all of the amounts to find the total grams of carbohydrates eaten: 30 g + 15 g + 15 g + 15 g = 75 g of carbohydrates total. What are tips for following this plan? Shopping Develop a meal plan and then make a shopping list. Buy fresh and frozen vegetables, fresh and frozen fruit, dairy, eggs, beans, lentils, and whole grains. Look at food labels. Choose foods that have more fiber and less sugar. Avoid processed foods and foods with added sugars. Meal planning Aim to have the same amount of carbohydrates at each meal and for each snack time. Plan to have regular, balanced meals and snacks. Where to find more information American Diabetes Association: www.diabetes.org Centers for Disease Control and Prevention: www.cdc.gov Summary Carbohydrate counting is a method of keeping track of how many carbohydrates you eat. Eating carbohydrates naturally increases the amount of sugar (glucose) in the blood. Counting how many carbohydrates you eat improves your blood glucose control, which helps you manage your diabetes. A dietitian can help you make a meal plan and calculate how many carbohydrates you should have at each meal and snack. This information is not intended to replace advice given to you by your health care provider. Make sure you discuss any questions you have with your healthcare provider. Document Revised: 07/01/2019 Document Reviewed: 07/02/2019 Elsevier Patient Education  2021 Elsevier Inc.  

## 2021-02-23 NOTE — Assessment & Plan Note (Signed)
Encourage heart healthy diet such as MIND or DASH diet, increase exercise, avoid trans fats, simple carbohydrates and processed foods, consider a krill or fish or flaxseed oil cap daily.  °

## 2021-02-23 NOTE — Assessment & Plan Note (Signed)
Check labs today [id number] to be checked , minimize simple carbs. Increase exercise as tolerated. Continue current meds  

## 2021-03-01 ENCOUNTER — Encounter: Payer: Self-pay | Admitting: Family Medicine

## 2021-03-01 NOTE — Telephone Encounter (Signed)
Please advise. Pt just seen on 02/23/21

## 2021-03-06 NOTE — Progress Notes (Signed)
[name] Phone: [phone] Subjective:   [name], am serving as a scribe for Dr. Hulan Saas. This visit occurred during the SARS-CoV-2 public health emergency.  Safety protocols were in place, including screening questions prior to the visit, additional usage of staff PPE, and extensive cleaning of exam room while observing appropriate contact time as indicated for disinfecting solutions.   I'm seeing this patient by the request  of:  Charles Held, DO  CC: right ankle pain   [id number]  Charles Trujillo is a 70 y.o. male coming in with complaint of R ankle pain. Patient states that he has had pain for a while throughout the joint. Ankle will swell when he is weightbearing. Patient walks with foot rotated inward as this alleviates his pain. Using IBU for pain relief.   Being treated for bladder cancer    Right ankle xrays from 3/31- severe OA of ankle mortise. Narrowing joint laterally   Past Medical History:  Diagnosis Date   Anxiety    Arthritis    bilateral knees   Depression    DM2 (diabetes mellitus, type 2) (HCC)    Dysrhythmia     benign tachycardia, takes Cartia   Elevated LFTs    previously evaluated by Dr Sharlett Iles; h/o "fatty liver"   GERD (gastroesophageal reflux disease)    HLD (hyperlipidemia)    Hypertension    Inguinal hernia    right   Squamous cell carcinoma    top of head   Past Surgical History:  Procedure Laterality Date   ACHILLES TENDON REPAIR  1999   BLADDER SURGERY     CARPAL TUNNEL RELEASE Right 11/04/2014   Procedure: LIMITED OPEN RIGHT CARPAL TUNNEL RELEASE;  Surgeon: Roseanne Kaufman, MD;  Location: [facility];  Service: Orthopedics;  Laterality: Right;   CARPAL TUNNEL WITH CUBITAL TUNNEL Left 07/22/2014   Procedure: LEFT CARPAL TUNNEL RELEASE AND CUBITAL TUNNEL RELEASE TRANSPOSITION;  Surgeon: Roseanne Kaufman, MD;  Location: [facility];  Service: Orthopedics;  Laterality: Left;   COLONOSCOPY     EYE SURGERY     laser eye surgery bilat    HERNIA REPAIR  07/2011   left side   INGUINAL HERNIA REPAIR  08/09/2011   Procedure: LAPAROSCOPIC INGUINAL HERNIA;  Surgeon: Joyice Faster. Cornett, MD;  Location: [facility];  Service: General;  Laterality: Right;   KNEE ARTHROSCOPY  1993   left knee   KNEE SURGERY     age 4- right   SQUAMOUS CELL CARCINOMA EXCISION     left knee   TOTAL KNEE ARTHROPLASTY Left 05/04/2019   Procedure: LEFT TOTAL KNEE ARTHROPLASTY;  Surgeon: Garald Balding, MD;  Location: [facility];  Service: Orthopedics;  Laterality: Left;   TOTAL KNEE ARTHROPLASTY Right 05/09/2020   Procedure: RIGHT TOTAL KNEE ARTHROPLASTY;  Surgeon: Garald Balding, MD;  Location: [facility];  Service: Orthopedics;  Laterality: Right;   Social History   Socioeconomic History   Marital status: Married    Spouse name: Not on file   Number of children: 1   Years of education: Not on file   Highest education level: Not on file  Occupational History   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    Employer: [employer]  Tobacco Use   Smoking status: Some Days    Types: Cigars   Smokeless tobacco: Never   Tobacco comments:  smokes one every other day  Vaping Use   Vaping Use: Never used  Substance and Sexual Activity   Alcohol use: Yes    Alcohol/week: 3.0 - 4.0 standard drinks    Types: 3 - 4 Cans of beer per week    Comment: social   Drug use: No   Sexual activity: Yes    Partners: Female  Other Topics Concern   Not on file  Social History Narrative   Married to [place]   exercise--- not regularly   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   Is a Radio broadcast assistant   Occasional smoker   Social Determinants of Radio broadcast assistant Strain: Not on file  Food Insecurity: Not on file  Transportation Needs: Not on file  Physical Activity: Not on  file  Stress: Not on file  Social Connections: Not on file   Allergies  Allergen Reactions   Meth-Hyo-M Bl-Na Phos-Ph Sal Other (See Comments)    [place] believes it may have contributed to renal failure accourding to his spouse who informed us at 17:17 01/19/2021   Codeine Nausea Only   Family History  Problem Relation Age of Onset   Diabetes Mother    Hypertension Mother    Heart disease Mother    Heart failure Mother    Stroke Father 32   Parkinsonism Father    Depression Brother    Alcohol abuse Brother    Cancer Maternal Grandmother        bone   Colon cancer Neg Hx    Colon polyps Neg Hx    Esophageal cancer Neg Hx    Rectal cancer Neg Hx    Stomach cancer Neg Hx     Current Outpatient Medications (Endocrine & Metabolic):    SitaGLIPtin-MetFORMIN HCl (JANUMET XR) 50-500 MG TB24, Take 1 tablet by mouth daily for 30 days. Continue lower dosage until told to resume normal dose by nephrology or Dr. Emeterio Reeve  Current Outpatient Medications (Cardiovascular):    atorvastatin (LIPITOR) 20 MG tablet, Take 1 tablet (20 mg total) by mouth daily.   atorvastatin (LIPITOR) 40 MG tablet, TAKE 1 TABLET BY MOUTH DAILY.   diltiazem (CARDIZEM CD) 120 MG 24 hr capsule, Take 1 capsule (120 mg total) by mouth daily.   diltiazem (CARDIZEM CD) 120 MG 24 hr capsule, TAKE 1 CAPSULE BY MOUTH ONCE A DAY   diltiazem (CARDIZEM CD) 120 MG 24 hr capsule, TAKE 1 CAPSULE BY MOUTH ONCE A DAY   diltiazem (CARDIZEM CD) 120 MG 24 hr capsule, Take 1 capsule (120 mg total) by mouth daily.   lisinopril (ZESTRIL) 10 MG tablet, TAKE 1 TABLET BY MOUTH DAILY * NEEDS OFFICE VISIT   metoprolol tartrate (LOPRESSOR) 50 MG tablet, TAKE 1 TABLET BY MOUTH 2 TIMES DAILY   metoprolol tartrate (LOPRESSOR) 50 MG tablet, TAKE 1 TABLET BY MOUTH 2 TIMES DAILY   Current Outpatient Medications (Analgesics):    HYDROmorphone (DILAUDID) 2 MG tablet, Take 1-2 tablets (2-4 mg total) by mouth every 6 (six) hours as needed for  moderate pain or severe pain.   Current Outpatient Medications (Other):    b complex-vitamin c-folic acid (NEPHRO-VITE) 0.8 MG TABS tablet, Take 1 tablet by mouth daily.   blood glucose meter kit and supplies KIT, Dispense based on patient and insurance preference. Use up to four times daily as directed.   flavoxATE (URISPAS) 100 MG tablet, Take 1 tablet (100 mg total) by mouth 3 times daily.  glucose blood (ONE TOUCH TEST STRIPS) test strip, Check blood sugar twice daily   methocarbamol (ROBAXIN) 500 MG tablet, TAKE 1 TABLET BY MOUTH EVERY 8 HOURS AS NEEDED FOR MUSCLE SPASMS   nystatin-triamcinolone ointment (MYCOLOG), Apply 1 application topically 2 (two) times daily.   ONETOUCH DELICA LANCETS FINE MISC, Use qd   phenazopyridine (PYRIDIUM) 95 MG tablet, Take 1 tablet (95 mg total) by mouth 3 (three) times daily as needed for Pain.   tamsulosin (FLOMAX) 0.4 MG CAPS capsule, Take 1 capsule (0.4 mg total) by mouth daily.   trospium (SANCTURA) 20 MG tablet, TAKE 1 TABLET BY MOUTH 2 TIMES DAILY FOR 10 DAYS   Reviewed prior external information including notes and imaging from  primary care provider As well as notes that were available from care everywhere and other healthcare systems.  Past medical history, social, surgical and family history all reviewed in electronic medical record.  No pertanent information unless stated regarding to the chief complaint.   Review of Systems:  No headache, visual changes, nausea, vomiting, diarrhea, constipation, dizziness, abdominal pain, skin rash, fevers, chills, night sweats, weight loss, swollen lymph nodes, body aches, joint swelling, chest pain, shortness of breath, mood changes. POSITIVE muscle aches  Objective  Blood pressure 126/66, pulse 74, height '5\' 7"'  (1.702 m), weight 184 lb (83.5 kg), SpO2 97 %.   General: No apparent distress alert and oriented x3 mood and affect normal, dressed appropriately.  HEENT: Pupils equal, extraocular movements  intact  Respiratory: Patient's speak in full sentences and does not appear short of breath  Cardiovascular: No lower extremity edema, non tender, no erythema  Gait very minorly antalgic Right ankle exam shows significant arthritic changes noted.  Limtied range of motion.  Patient is really not tender.  Does have some limited range of motion of the with internal and external lacks last 5 degrees of dorsiflexion of the last 15 degrees of plantarflexion  Limited muscular skeletal ultrasound was performed and interpreted by Hulan Saas, M  Limited musculoskeletal ultrasound does show the patient does have arthritic changes with narrowing of the ankle mortise.  No no cortical irregularity for any type of acute fracture.  Patient does have a what appears to be potential chronic avulsion fracture of the lateral malleolus.  Tendons appear to be fairly unremarkable.  Mild to moderate midfoot arthritic changes noted as well. Impression: Ankle arthritis    Impression and Recommendations:     The above documentation has been reviewed and is accurate and complete Lyndal Pulley, DO

## 2021-03-07 ENCOUNTER — Ambulatory Visit (INDEPENDENT_AMBULATORY_CARE_PROVIDER_SITE_OTHER): Payer: No Typology Code available for payment source

## 2021-03-07 ENCOUNTER — Other Ambulatory Visit: Payer: Self-pay

## 2021-03-07 ENCOUNTER — Ambulatory Visit: Payer: No Typology Code available for payment source | Admitting: Family Medicine

## 2021-03-07 ENCOUNTER — Ambulatory Visit: Payer: Self-pay

## 2021-03-07 VITALS — BP 126/66 | HR 74 | Ht 67.0 in | Wt 184.0 lb

## 2021-03-07 DIAGNOSIS — M25571 Pain in right ankle and joints of right foot: Secondary | ICD-10-CM

## 2021-03-07 DIAGNOSIS — M19071 Primary osteoarthritis, right ankle and foot: Secondary | ICD-10-CM

## 2021-03-07 NOTE — Patient Instructions (Addendum)
Read about PRP HOKA recovery sandals HOKA Bondhi or Gravity Defyer shoes Vit D 2000IU daily Ice after long day Have appt in 2 months but if you want to do PRP please give [place] a heads up

## 2021-03-08 ENCOUNTER — Encounter: Payer: Self-pay | Admitting: Family Medicine

## 2021-03-08 ENCOUNTER — Other Ambulatory Visit: Payer: Self-pay | Admitting: Orthopaedic Surgery

## 2021-03-08 NOTE — Telephone Encounter (Signed)
Ok to prescribe

## 2021-03-08 NOTE — Assessment & Plan Note (Signed)
Chronic problem with exacerbation.  Repeat x-rays ordered today.  Discussed proper shoes, avoiding being barefoot at home, discussed range of motion exercises.  We did discuss the possibility of a PRP injection that could be something beneficial.  Patient given a handout.  Follow-up with me again 4 to 8 weeks

## 2021-03-22 ENCOUNTER — Encounter: Payer: Self-pay | Admitting: Family Medicine

## 2021-03-22 MED ORDER — JANUMET XR 50-500 MG PO TB24: 1.0000 | ORAL_TABLET | Freq: Every day | ORAL | 2 refills | Status: DC

## 2021-04-09 ENCOUNTER — Other Ambulatory Visit (HOSPITAL_COMMUNITY): Payer: Self-pay

## 2021-05-04 NOTE — Progress Notes (Deleted)
[place] Phone: [phone] Subjective:    I'm seeing this patient by the request  of:  [name], DO  CC:   [id number]  03/07/2021 Chronic problem with exacerbation.  Repeat x-rays ordered today.  Discussed proper shoes, avoiding being barefoot at home, discussed range of motion exercises.  We did discuss the possibility of a PRP injection that could be something beneficial.  Patient given a handout.  Follow-up with me again 4 to 8 weeks  Updated 05/07/2021 Charles Trujillo is a 70 y.o. male coming in with complaint of right ankle pain  Xray IMPRESSION: 1. No fracture or acute finding. 2. Degenerative changes of the tibiotalar joint with significant lateral joint space narrowing. The appearance is stable compared to the prior study.  Onset-  Location Duration-  Character- Aggravating factors- Reliving factors-  Therapies tried-  Severity-     Past Medical History:  Diagnosis Date   Anxiety    Arthritis    bilateral knees   Depression    DM2 (diabetes mellitus, type 2) (HCC)    Dysrhythmia     benign tachycardia, takes Cartia   Elevated LFTs    previously evaluated by Dr Sharlett Iles; h/o "fatty liver"   GERD (gastroesophageal reflux disease)    HLD (hyperlipidemia)    Hypertension    Inguinal hernia    right   Squamous cell carcinoma    top of head   Past Surgical History:  Procedure Laterality Date   ACHILLES TENDON REPAIR  1999   BLADDER SURGERY     CARPAL TUNNEL RELEASE Right 11/04/2014   Procedure: LIMITED OPEN RIGHT CARPAL TUNNEL RELEASE;  Surgeon: Roseanne Kaufman, MD;  Location: [place] [facility] [place];  Service: Orthopedics;  Laterality: Right;   CARPAL TUNNEL WITH CUBITAL TUNNEL Left 07/22/2014   Procedure: LEFT CARPAL TUNNEL RELEASE AND CUBITAL TUNNEL RELEASE TRANSPOSITION;  Surgeon: Roseanne Kaufman, MD;  Location: [facility];  Service: Orthopedics;   Laterality: Left;   COLONOSCOPY     EYE SURGERY     laser eye surgery bilat    HERNIA REPAIR  07/2011   left side   INGUINAL HERNIA REPAIR  08/09/2011   Procedure: LAPAROSCOPIC INGUINAL HERNIA;  Surgeon: Joyice Faster. Cornett, MD;  Location: [facility];  Service: General;  Laterality: Right;   KNEE ARTHROSCOPY  1993   left knee   KNEE SURGERY     age 10- right   SQUAMOUS CELL CARCINOMA EXCISION     left knee   TOTAL KNEE ARTHROPLASTY Left 05/04/2019   Procedure: LEFT TOTAL KNEE ARTHROPLASTY;  Surgeon: Garald Balding, MD;  Location[facility];  Service: Orthopedics;  Laterality: Left;   TOTAL KNEE ARTHROPLASTY Right 05/09/2020   Procedure: RIGHT TOTAL KNEE ARTHROPLASTY;  Surgeon: Garald Balding, MD;  Location: [facility];  Service: Orthopedics;  Laterality: Right;   Social History   Socioeconomic History   Marital status: Married    Spouse name: Not on file   Number of children: 1   Years of education: Not on file   Highest education level: Not on file  Occupational History   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    Employer: [employer]  Tobacco Use   Smoking status: Some Days    Types: Cigars   Smokeless tobacco: Never   Tobacco comments:    smokes one every other day  Vaping Use   Vaping Use:  Never used  Substance and Sexual Activity   Alcohol use: Yes    Alcohol/week: 3.0 - 4.0 standard drinks    Types: 3 - 4 Cans of beer per week    Comment: social   Drug use: No   Sexual activity: Yes    Partners: Female  Other Topics Concern   Not on file  Social History Narrative   Married to [name]   exercise--- not regularly   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   Is a Radio broadcast assistant   Occasional smoker   Social Determinants of Radio broadcast assistant Strain: Not on file  Food Insecurity: Not on file  Transportation Needs: Not on file  Physical Activity: Not on file  Stress: Not on file  Social  Connections: Not on file   Allergies  Allergen Reactions   Meth-Hyo-M Bl-Na Phos-Ph Sal Other (See Comments)    [place] believes it may have contributed to renal failure accourding to his spouse who informed us at 17:17 01/19/2021   Codeine Nausea Only   Family History  Problem Relation Age of Onset   Diabetes Mother    Hypertension Mother    Heart disease Mother    Heart failure Mother    Stroke Father 76   Parkinsonism Father    Depression Brother    Alcohol abuse Brother    Cancer Maternal Grandmother        bone   Colon cancer Neg Hx    Colon polyps Neg Hx    Esophageal cancer Neg Hx    Rectal cancer Neg Hx    Stomach cancer Neg Hx     Current Outpatient Medications (Endocrine & Metabolic):    SitaGLIPtin-MetFORMIN HCl (JANUMET XR) 50-500 MG TB24, Take 1 tablet by mouth daily.  Current Outpatient Medications (Cardiovascular):    atorvastatin (LIPITOR) 20 MG tablet, Take 1 tablet (20 mg total) by mouth daily.   atorvastatin (LIPITOR) 40 MG tablet, TAKE 1 TABLET BY MOUTH DAILY.   diltiazem (CARDIZEM CD) 120 MG 24 hr capsule, Take 1 capsule (120 mg total) by mouth daily.   diltiazem (CARDIZEM CD) 120 MG 24 hr capsule, TAKE 1 CAPSULE BY MOUTH ONCE A DAY   diltiazem (CARDIZEM CD) 120 MG 24 hr capsule, TAKE 1 CAPSULE BY MOUTH ONCE A DAY   diltiazem (CARDIZEM CD) 120 MG 24 hr capsule, Take 1 capsule (120 mg total) by mouth daily.   lisinopril (ZESTRIL) 10 MG tablet, TAKE 1 TABLET BY MOUTH DAILY * NEEDS OFFICE VISIT   metoprolol tartrate (LOPRESSOR) 50 MG tablet, TAKE 1 TABLET BY MOUTH 2 TIMES DAILY   metoprolol tartrate (LOPRESSOR) 50 MG tablet, TAKE 1 TABLET BY MOUTH 2 TIMES DAILY   Current Outpatient Medications (Analgesics):    HYDROmorphone (DILAUDID) 2 MG tablet, Take 1-2 tablets (2-4 mg total) by mouth every 6 (six) hours as needed for moderate pain or severe pain.   Current Outpatient Medications (Other):    amoxicillin (AMOXIL) 500 MG capsule, Take 4 capsules by  mouth one hour prior to any dental procedures   b complex-vitamin c-folic acid (NEPHRO-VITE) 0.8 MG TABS tablet, Take 1 tablet by mouth daily.   blood glucose meter kit and supplies KIT, Dispense based on patient and insurance preference. Use up to four times daily as directed.   flavoxATE (URISPAS) 100 MG tablet, Take 1 tablet (100 mg total) by mouth 3 times daily.   glucose blood (ONE TOUCH TEST STRIPS) test strip, Check blood  sugar twice daily   methocarbamol (ROBAXIN) 500 MG tablet, TAKE 1 TABLET BY MOUTH EVERY 8 HOURS AS NEEDED FOR MUSCLE SPASMS   nystatin-triamcinolone ointment (MYCOLOG), Apply 1 application topically 2 (two) times daily.   ONETOUCH DELICA LANCETS FINE MISC, Use qd   phenazopyridine (PYRIDIUM) 95 MG tablet, Take 1 tablet (95 mg total) by mouth 3 (three) times daily as needed for Pain.   tamsulosin (FLOMAX) 0.4 MG CAPS capsule, Take 1 capsule (0.4 mg total) by mouth daily.   trospium (SANCTURA) 20 MG tablet, TAKE 1 TABLET BY MOUTH 2 TIMES DAILY FOR 10 DAYS   Reviewed prior external information including notes and imaging from  primary care provider As well as notes that were available from care everywhere and other healthcare systems.  Past medical history, social, surgical and family history all reviewed in electronic medical record.  No pertanent information unless stated regarding to the chief complaint.   Review of Systems:  No headache, visual changes, nausea, vomiting, diarrhea, constipation, dizziness, abdominal pain, skin rash, fevers, chills, night sweats, weight loss, swollen lymph nodes, body aches, joint swelling, chest pain, shortness of breath, mood changes. POSITIVE muscle aches  Objective  There were no vitals taken for this visit.   General: No apparent distress alert and oriented x3 mood and affect normal, dressed appropriately.  HEENT: Pupils equal, extraocular movements intact  Respiratory: Patient's speak in full sentences and does not appear  short of breath  Cardiovascular: No lower extremity edema, non tender, no erythema  Gait normal with good balance and coordination.  MSK:  Non tender with full range of motion and good stability and symmetric strength and tone of shoulders, elbows, wrist, hip, knee and ankles bilaterally.     Impression and Recommendations:     The above documentation has been reviewed and is accurate and complete Belva Agee

## 2021-05-07 ENCOUNTER — Ambulatory Visit: Payer: No Typology Code available for payment source | Admitting: Family Medicine

## 2021-05-21 ENCOUNTER — Other Ambulatory Visit: Payer: Self-pay | Admitting: Family Medicine

## 2021-05-29 ENCOUNTER — Encounter: Payer: Self-pay | Admitting: Family Medicine

## 2021-05-29 ENCOUNTER — Other Ambulatory Visit: Payer: Self-pay

## 2021-05-29 ENCOUNTER — Ambulatory Visit (INDEPENDENT_AMBULATORY_CARE_PROVIDER_SITE_OTHER): Payer: No Typology Code available for payment source | Admitting: Family Medicine

## 2021-05-29 VITALS — BP 120/70 | HR 93 | Temp 97.8°F | Resp 20 | Ht 67.0 in | Wt 193.2 lb

## 2021-05-29 DIAGNOSIS — E785 Hyperlipidemia, unspecified: Secondary | ICD-10-CM | POA: Diagnosis not present

## 2021-05-29 DIAGNOSIS — Z0000 Encounter for general adult medical examination without abnormal findings: Secondary | ICD-10-CM | POA: Diagnosis not present

## 2021-05-29 DIAGNOSIS — E1169 Type 2 diabetes mellitus with other specified complication: Secondary | ICD-10-CM

## 2021-05-29 DIAGNOSIS — E1165 Type 2 diabetes mellitus with hyperglycemia: Secondary | ICD-10-CM | POA: Diagnosis not present

## 2021-05-29 DIAGNOSIS — I10 Essential (primary) hypertension: Secondary | ICD-10-CM | POA: Diagnosis not present

## 2021-05-29 LAB — COMPREHENSIVE METABOLIC PANEL
ALT: 24 U/L (ref 0–53)
AST: 33 U/L (ref 0–37)
Albumin: 4 g/dL (ref 3.5–5.2)
Alkaline Phosphatase: 54 U/L (ref 39–117)
BUN: 10 mg/dL (ref 6–23)
CO2: 30 mEq/L (ref 19–32)
Calcium: 9.2 mg/dL (ref 8.4–10.5)
Chloride: 101 mEq/L (ref 96–112)
Creatinine, Ser: 1.07 mg/dL (ref 0.40–1.50)
GFR: 69.62 mL/min (ref >60.00)
Glucose, Bld: 165 mg/dL — ABNORMAL HIGH (ref 70–99)
Potassium: 4.3 mEq/L (ref 3.5–5.1)
Sodium: 137 mEq/L (ref 135–145)
Total Bilirubin: 0.6 mg/dL (ref 0.2–1.2)
Total Protein: 6.9 g/dL (ref 6.0–8.3)

## 2021-05-29 LAB — CBC WITH DIFFERENTIAL/PLATELET
Basophils Absolute: 0.1 10*3/uL (ref 0.0–0.1)
Basophils Relative: 2.1 % (ref 0.0–3.0)
Eosinophils Absolute: 0.4 10*3/uL (ref 0.0–0.7)
Eosinophils Relative: 8.1 % — ABNORMAL HIGH (ref 0.0–5.0)
HCT: 42.8 % (ref 39.0–52.0)
Hemoglobin: 14 g/dL (ref 13.0–17.0)
Lymphocytes Relative: 15.1 % (ref 12.0–46.0)
Lymphs Abs: 0.7 10*3/uL (ref 0.7–4.0)
MCHC: 32.7 g/dL (ref 30.0–36.0)
MCV: 86.3 fl (ref 78.0–100.0)
Monocytes Absolute: 0.5 10*3/uL (ref 0.1–1.0)
Monocytes Relative: 9.4 % (ref 3.0–12.0)
Neutro Abs: 3.2 10*3/uL (ref 1.4–7.7)
Neutrophils Relative %: 65.3 % (ref 43.0–77.0)
Platelets: 243 10*3/uL (ref 150.0–400.0)
RBC: 4.95 Mil/uL (ref 4.22–5.81)
RDW: 15.3 % (ref 11.5–15.5)
WBC: 5 10*3/uL (ref 4.0–10.5)

## 2021-05-29 LAB — HEMOGLOBIN A1C: Hgb A1c MFr Bld: 6.8 % — ABNORMAL HIGH (ref 4.6–6.5)

## 2021-05-29 LAB — LIPID PANEL
Cholesterol: 125 mg/dL (ref 0–200)
HDL: 48 mg/dL (ref >39.00)
LDL Cholesterol: 60 mg/dL (ref 0–99)
NonHDL: 76.66
Total CHOL/HDL Ratio: 3
Triglycerides: 85 mg/dL (ref 0.0–149.0)
VLDL: 17 mg/dL (ref 0.0–40.0)

## 2021-05-29 LAB — TSH: TSH: 1.94 u[IU]/mL (ref 0.35–5.50)

## 2021-05-29 MED ORDER — JANUMET XR 50-500 MG PO TB24: 1.0000 | ORAL_TABLET | Freq: Every day | ORAL | 2 refills | Status: DC

## 2021-05-29 NOTE — Patient Instructions (Signed)
Preventive Care 65 Years and Older, Male °Preventive care refers to lifestyle choices and visits with your health care provider that can promote health and wellness. Preventive care visits are also called wellness exams. °What can I expect for my preventive care visit? °Counseling °During your preventive care visit, your health care provider may ask about your: °Medical history, including: °Past medical problems. °Family medical history. °History of falls. °Current health, including: °Emotional well-being. °Home life and relationship well-being. °Sexual activity. °Memory and ability to understand (cognition). °Lifestyle, including: °Alcohol, nicotine or tobacco, and drug use. °Access to firearms. °Diet, exercise, and sleep habits. °Work and work environment. °Sunscreen use. °Safety issues such as seatbelt and bike helmet use. °Physical exam °Your health care provider will check your: °Height and weight. These may be used to calculate your BMI (body mass index). BMI is a measurement that tells if you are at a healthy weight. °Waist circumference. This measures the distance around your waistline. This measurement also tells if you are at a healthy weight and may help predict your risk of certain diseases, such as type 2 diabetes and high blood pressure. °Heart rate and blood pressure. °Body temperature. °Skin for abnormal spots. °What immunizations do I need? °Vaccines are usually given at various ages, according to a schedule. Your health care provider will recommend vaccines for you based on your age, medical history, and lifestyle or other factors, such as travel or where you work. °What tests do I need? °Screening °Your health care provider may recommend screening tests for certain conditions. This may include: °Lipid and cholesterol levels. °Diabetes screening. This is done by checking your blood sugar (glucose) after you have not eaten for a while (fasting). °Hepatitis C test. °Hepatitis B test. °HIV (human  immunodeficiency virus) test. °STI (sexually transmitted infection) testing, if you are at risk. °Lung cancer screening. °Colorectal cancer screening. °Prostate cancer screening. °Abdominal aortic aneurysm (AAA) screening. You may need this if you are a current or former smoker. °Talk with your health care provider about your test results, treatment options, and if necessary, the need for more tests. °Follow these instructions at home: °Eating and drinking ° °Eat a diet that includes fresh fruits and vegetables, whole grains, lean protein, and low-fat dairy products. Limit your intake of foods with high amounts of sugar, saturated fats, and salt. °Take vitamin and mineral supplements as recommended by your health care provider. °Do not drink alcohol if your health care provider tells you not to drink. °If you drink alcohol: °Limit how much you have to 0-2 drinks a day. °Know how much alcohol is in your drink. In the [place] one drink equals one 12 oz bottle of beer (355 mL), one 5 oz glass of wine (148 mL), or one 1½ oz glass of hard liquor (44 mL). °Lifestyle °Brush your teeth every morning and night with fluoride toothpaste. Floss one time each day. °Exercise for at least 30 minutes 5 or more days each week. °Do not use any products that contain nicotine or tobacco. These products include cigarettes, chewing tobacco, and vaping devices, such as e-cigarettes. If you need help quitting, ask your health care provider. °Do not use drugs. °If you are sexually active, practice safe sex. Use a condom or other form of protection to prevent STIs. °Take aspirin only as told by your health care provider. Make sure that you understand how much to take and what form to take. Work with your health care provider to find out whether it is safe and   beneficial for you to take aspirin daily. °Ask your health care provider if you need to take a cholesterol-lowering medicine (statin). °Find healthy ways to manage stress, such  as: °Meditation, yoga, or listening to music. °Journaling. °Talking to a trusted person. °Spending time with friends and family. °Safety °Always wear your seat belt while driving or riding in a vehicle. °Do not drive: °If you have been drinking alcohol. Do not ride with someone who has been drinking. °When you are tired or distracted. °While texting. °If you have been using any mind-altering substances or drugs. °Wear a helmet and other protective equipment during sports activities. °If you have firearms in your house, make sure you follow all gun safety procedures. °Minimize exposure to UV radiation to reduce your risk of skin cancer. °What's next? °Visit your health care provider once a year for an annual wellness visit. °Ask your health care provider how often you should have your eyes and teeth checked. °Stay up to date on all vaccines. °This information is not intended to replace advice given to you by your health care provider. Make sure you discuss any questions you have with your health care provider. °Document Revised: 12/27/2020 Document Reviewed: 12/27/2020 °Elsevier Patient Education © 2022 [employer] Inc. ° °

## 2021-05-29 NOTE — Assessment & Plan Note (Signed)
Tolerating statin, encouraged heart healthy diet, avoid trans fats, minimize simple carbs and saturated fats. Increase exercise as tolerated 

## 2021-05-29 NOTE — Assessment & Plan Note (Signed)
Well controlled, no changes to meds. Encouraged heart healthy diet such as the DASH diet and exercise as tolerated.  °

## 2021-05-29 NOTE — Assessment & Plan Note (Signed)
hgba1c to be checked, minimize simple carbs. Increase exercise as tolerated. Continue current meds  

## 2021-05-29 NOTE — Assessment & Plan Note (Signed)
ghm utd Check labs  See avs  

## 2021-05-29 NOTE — Progress Notes (Addendum)
Subjective:   By signing my name below, I, Charles Trujillo, attest that this documentation has been prepared under the direction and in the presence of Dr. Roma Schanz, DO. 05/29/2021    Patient ID: Charles Trujillo, male    DOB: 1951/02/28, 70 y.o.   MRN: [id number]  Chief Complaint  Patient presents with   Annual Exam    Pt states not fasting     HPI Patient is in today for a comprehensive physical exam.   Darkened skin patch- He reports having 1 darkened skins patches on his right lower leg and right upper leg. He has made an appointment with his dermatologist.  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  Blood sugar- He reports his recent blood sugars are measuring high. He continues taking 50-500 mg Junumet XR daily PO and reports no new issues while taking it.   Lab Results  Component Value Date   HGBA1C 5.5 02/23/2021   CPE He denies having any fever, new moles, congestion, sore throat, new muscle pain, new joint pain, chest pain, cough, SOB, wheezing, n/v/d, constipation, blood in stool, dysuria, frequency, hematuria, or headaches at this time. Immunizations- He is UTD on flu vaccines. He reports receiving the bivalent Covid-19 vaccine but does not have his vaccine card with him. He is UTD on shingles vaccines.  Colonoscopy- Last completed 03/24/2018. Results showed diverticulosis in sigmoid colon, otherwise results are normal. Repeat in 10 years.  PSA- His urologist completes his PSA. Vision- He has an [place] appointment with his vision care specialist.    Past Medical History:  Diagnosis Date   Anxiety    Arthritis    bilateral knees   Depression    DM2 (diabetes mellitus, type 2) ([place])    Dysrhythmia     benign tachycardia, takes Cartia   Elevated LFTs    previously evaluated by Dr Sharlett Iles; h/o "fatty liver"    GERD (gastroesophageal reflux disease)    HLD (hyperlipidemia)    Hypertension    Inguinal hernia    right   Squamous cell carcinoma    top of head    Past Surgical History:  Procedure Laterality Date   ACHILLES TENDON REPAIR  1999   BLADDER SURGERY     CARPAL TUNNEL RELEASE Right 11/04/2014   Procedure: LIMITED OPEN RIGHT CARPAL TUNNEL RELEASE;  Surgeon: Roseanne Kaufman, MD;  Location: [facility];  Service: Orthopedics;  Laterality: Right;   CARPAL TUNNEL WITH CUBITAL TUNNEL Left 07/22/2014   Procedure: LEFT CARPAL TUNNEL RELEASE AND CUBITAL TUNNEL RELEASE TRANSPOSITION;  Surgeon: Roseanne Kaufman, MD;  Location: [facility];  Service: Orthopedics;  Laterality: Left;   COLONOSCOPY     EYE SURGERY     laser eye surgery bilat    HERNIA REPAIR  07/2011   left side   INGUINAL HERNIA REPAIR  08/09/2011   Procedure: LAPAROSCOPIC INGUINAL HERNIA;  Surgeon: Joyice Faster. Cornett, MD;  Location: [facility];  Service: General;  Laterality: Right;   KNEE ARTHROSCOPY  1993   left knee   KNEE SURGERY     age 59- right   SQUAMOUS CELL CARCINOMA EXCISION     left knee   TOTAL KNEE ARTHROPLASTY Left 05/04/2019   Procedure: LEFT TOTAL KNEE ARTHROPLASTY;  Surgeon: Garald Balding, MD;  Location: [facility];  Service: Orthopedics;  Laterality:  Left;   TOTAL KNEE ARTHROPLASTY Right 05/09/2020   Procedure: RIGHT TOTAL KNEE ARTHROPLASTY;  Surgeon: Garald Balding, MD;  Location: [facility];  Service: Orthopedics;  Laterality: Right;    Family History  Problem Relation Age of Onset   Diabetes Mother    Hypertension Mother    Heart disease Mother    Heart failure Mother    Stroke Father 23   Parkinsonism Father    Depression Brother    Alcohol abuse Brother    Cancer Maternal Grandmother        bone   Colon cancer Neg Hx    Colon polyps Neg Hx    Esophageal cancer Neg Hx    Rectal cancer Neg Hx    Stomach cancer Neg Hx     Social History   Socioeconomic History    Marital status: Married    Spouse name: Not on file   Number of children: 1   Years of education: Not on file   Highest education level: Not on file  Occupational History   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, fast food: [place]  Tobacco Use   Smoking status: Some Days    Types: Cigars   Smokeless tobacco: Never   Tobacco comments:    smokes one every other day  Vaping Use   Vaping Use: Never used  Substance and Sexual Activity   Alcohol use: Yes    Alcohol/week: 3.0 - 4.0 standard drinks    Types: 3 - 4 Cans of beer per week    Comment: social   Drug use: No   Sexual activity: Yes    Partners: Female  Other Topics Concern   Not on file  Social History Narrative   Married to [place]   exercise--- not regularly   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 90 tablet 1   atorvastatin (LIPITOR) 40 MG tablet TAKE 1 TABLET BY MOUTH DAILY. 90 tablet 3   b complex-vitamin c-folic acid (NEPHRO-VITE) 0.8 MG TABS tablet Take 1 tablet by mouth daily. 30 tablet 1   blood glucose meter kit and supplies KIT Dispense based on patient and insurance preference. Use up to four times daily as directed. 1 each 0   diltiazem (CARDIZEM CD) 120 MG 24 hr capsule Take 1 capsule (120 mg total) by mouth daily. 90 capsule 1   diltiazem (CARDIZEM CD) 120 MG 24 hr  capsule TAKE 1 CAPSULE BY MOUTH ONCE A DAY 90 capsule 3   glucose blood (ONE TOUCH TEST STRIPS) test strip Check blood sugar twice daily 100 each 11   HYDROmorphone (DILAUDID) 2 MG tablet Take 1-2 tablets (2-4 mg total) by mouth every 6 (six) hours as needed for moderate pain or severe pain. 45 tablet 0   lisinopril (ZESTRIL) 10 MG tablet  TAKE 1 TABLET BY MOUTH DAILY * NEEDS OFFICE VISIT 90 tablet 1   metoprolol tartrate (LOPRESSOR) 50 MG tablet TAKE 1 TABLET BY MOUTH 2 TIMES DAILY 180 tablet 3   nystatin-triamcinolone ointment (MYCOLOG) Apply 1 application topically 2 (two) times daily. 30 g 1   ONETOUCH DELICA LANCETS FINE MISC Use qd 100 each 3   phenazopyridine (PYRIDIUM) 95 MG tablet Take 1 tablet (95 mg total) by mouth 3 (three) times daily as needed for Pain. 10 tablet 0   tamsulosin (FLOMAX) 0.4 MG CAPS capsule Take 1 capsule (0.4 mg total) by mouth daily. 30 capsule 0   trospium (SANCTURA) 20 MG tablet TAKE 1 TABLET BY MOUTH 2 TIMES DAILY FOR 10 DAYS 20 tablet 0   JANUMET XR 50-500 MG TB24 Take 1 tablet by mouth daily. 30 tablet 2   diltiazem (CARDIZEM CD) 120 MG 24 hr capsule TAKE 1 CAPSULE BY MOUTH ONCE A DAY 90 capsule 3   diltiazem (CARDIZEM CD) 120 MG 24 hr capsule Take 1 capsule (120 mg total) by mouth daily. 90 capsule 3   flavoxATE (URISPAS) 100 MG tablet Take 1 tablet (100 mg total) by mouth 3 times daily. 90 tablet 1   metoprolol tartrate (LOPRESSOR) 50 MG tablet TAKE 1 TABLET BY MOUTH 2 TIMES DAILY 180 tablet 1   No facility-administered medications prior to visit.    Allergies  Allergen Reactions   Meth-Hyo-M Bl-Na Phos-Ph Sal Other (See Comments)    [place] believes it may have contributed to renal failure accourding to his spouse who informed us at 17:17 01/19/2021   Codeine Nausea Only    Review of Systems  Constitutional:  Negative for fever and malaise/fatigue.       (-)unexpected weight change (-)Adenopathy  HENT:  Negative for congestion and hearing loss.         (-)Rhinorrhea   Eyes:  Negative for blurred vision.       (-)Visual disturbance  Respiratory:  Negative for cough and shortness of breath.   Cardiovascular:  Negative for chest pain, palpitations and leg swelling.  Gastrointestinal:  Negative for blood in stool, constipation, diarrhea, nausea and vomiting.  Genitourinary:  Negative for dysuria and frequency.  Musculoskeletal:  Negative for back pain, joint pain and myalgias.  Skin:  Negative for rash.       (+)2 darkened skin patches on right leg  Neurological:  Negative for loss of consciousness and headaches.  Psychiatric/Behavioral:  Negative for depression. The patient is not nervous/anxious.       Objective:    Physical Exam Vitals and nursing note reviewed.  Constitutional:      General: He is not in acute distress.    Appearance: Normal appearance. He is not ill-appearing.  HENT:     Head: Normocephalic and atraumatic.     Right Ear: Tympanic membrane, ear canal and external ear normal.     Left Ear: Tympanic membrane, ear canal and external ear normal.  Eyes:     Extraocular Movements: Extraocular movements intact.     Pupils: Pupils are equal, round, and reactive to light.  Cardiovascular:     Rate and Rhythm: Normal rate and regular rhythm.     Pulses: Normal pulses.     Heart sounds: Normal heart sounds. No murmur heard.   No gallop.  Pulmonary:     Effort: Pulmonary effort is normal. No respiratory distress.     Breath sounds: Normal breath sounds. No wheezing or rales.  Abdominal:  General: Bowel sounds are normal. There is no distension.     Palpations: Abdomen is soft.     Tenderness: There is no abdominal tenderness. There is no guarding.  Feet:     Comments: Diabetic Foot Exam - Simple   Simple Foot Form Diabetic Foot exam was performed with the following findings: Yes  05/29/2021 10:45 AM  Visual Inspection No deformities, no ulcerations, no other skin breakdown bilaterally: Yes Sensation  Testing Intact to touch and monofilament testing bilaterally: Yes Pulse Check Posterior Tibialis and Dorsalis pulse intact bilaterally: Yes Comments    Skin:    General: Skin is warm and dry.     Findings: Lesion present.          Comments: + wart like flesh colored lesion R calf---- ? SCC  Neurological:     Mental Status: He is alert and oriented to person, place, and time.  Psychiatric:        Behavior: Behavior normal.        Judgment: Judgment normal.    BP 120/70 (BP Location: Right Arm, Patient Position: Sitting, Cuff Size: Normal)   Pulse 93   Temp 97.8 F (36.6 C) (Oral)   Resp 20   Ht '5\' 7"'  (1.702 m)   Wt 193 lb 3.2 oz (87.6 kg)   SpO2 97%   BMI 30.26 kg/m  Wt Readings from Last 3 Encounters:  05/29/21 193 lb 3.2 oz (87.6 kg)  03/07/21 184 lb (83.5 kg)  02/23/21 185 lb (83.9 kg)    Diabetic Foot Exam - Simple   Simple Foot Form Diabetic Foot exam was performed with the following findings: Yes 05/29/2021 10:45 AM  Visual Inspection No deformities, no ulcerations, no other skin breakdown bilaterally: Yes Sensation Testing Intact to touch and monofilament testing bilaterally: Yes Pulse Check Posterior Tibialis and Dorsalis pulse intact bilaterally: Yes Comments    Lab Results  Component Value Date   WBC 16.0 (H) 07/25/2020   HGB 15.8 07/25/2020   HCT 46.2 07/25/2020   PLT 310 07/25/2020   GLUCOSE 117 (H) 02/23/2021   CHOL 125 02/23/2021   TRIG 55.0 02/23/2021   HDL 49.90 02/23/2021   LDLDIRECT 143.5 02/01/2011   LDLCALC 64 02/23/2021   ALT 20 02/23/2021   AST 28 02/23/2021   NA 139 02/23/2021   K 4.3 02/23/2021   CL 104 02/23/2021   CREATININE 1.07 02/23/2021   BUN 10 02/23/2021   CO2 29 02/23/2021   TSH 5.50 (H) 12/09/2017   PSA 0.33 04/03/2012   INR 1.2 05/01/2020   HGBA1C 5.5 02/23/2021   MICROALBUR 29.4 (H) 08/31/2020    Lab Results  Component Value Date   TSH 5.50 (H) 12/09/2017   Lab Results  Component Value Date   WBC 16.0  (H) 07/25/2020   HGB 15.8 07/25/2020   HCT 46.2 07/25/2020   MCV 90.6 07/25/2020   PLT 310 07/25/2020   Lab Results  Component Value Date   NA 139 02/23/2021   K 4.3 02/23/2021   CO2 29 02/23/2021   GLUCOSE 117 (H) 02/23/2021   BUN 10 02/23/2021   CREATININE 1.07 02/23/2021   BILITOT 0.6 02/23/2021   ALKPHOS 47 02/23/2021   AST 28 02/23/2021   ALT 20 02/23/2021   PROT 6.8 02/23/2021   ALBUMIN 3.6 02/23/2021   CALCIUM 9.0 02/23/2021   ANIONGAP 12 07/25/2020   GFR 69.74 02/23/2021   Lab Results  Component Value Date   CHOL 125 02/23/2021   Lab Results  Component Value Date   HDL 49.90 02/23/2021   Lab Results  Component Value Date   LDLCALC 64 02/23/2021   Lab Results  Component Value Date   TRIG 55.0 02/23/2021   Lab Results  Component Value Date   CHOLHDL 3 02/23/2021   Lab Results  Component Value Date   HGBA1C 5.5 02/23/2021       Assessment & Plan:   Problem List Items Addressed This Visit       Unprioritized   Hyperlipidemia associated with type 2 diabetes mellitus ([place])   Relevant Medications   SitaGLIPtin-MetFORMIN HCl (JANUMET XR) 50-500 MG TB24   Other Relevant Orders   Comprehensive metabolic panel   Lipid panel   Essential hypertension    Well controlled, no changes to meds. Encouraged heart healthy diet such as the DASH diet and exercise as tolerated.       HLD (hyperlipidemia)    Tolerating statin, encouraged heart healthy diet, avoid trans fats, minimize simple carbs and saturated fats. Increase exercise as tolerated      Preventative health care - Primary    ghm utd Check labs  See avs      Relevant Orders   CBC with Differential/Platelet   Comprehensive metabolic panel   Lipid panel   Hemoglobin A1c   TSH   Other Visit Diagnoses     Type 2 diabetes mellitus with hyperglycemia, without long-term current use of insulin (HCC)       Relevant Medications   SitaGLIPtin-MetFORMIN HCl (JANUMET XR) 50-500 MG TB24   Other  Relevant Orders   Comprehensive metabolic panel   Hemoglobin A1c        Meds ordered this encounter  Medications   SitaGLIPtin-MetFORMIN HCl (JANUMET XR) 50-500 MG TB24    Sig: Take 1 tablet by mouth daily.    Dispense:  30 tablet    Refill:  2    I, Dr. Roma Schanz, DO, personally preformed the services described in this documentation.  All medical record entries made by the scribe were at my direction and in my presence.  I have reviewed the chart and discharge instructions (if applicable) and agree that the record reflects my personal performance and is accurate and complete. 05/29/2021   I,Charles Trujillo,acting as a scribe for [name], DO.,have documented all relevant documentation on the behalf of [name], DO,as directed by  [name], DO while in the presence of [name], DO.   [name], DO

## 2021-05-30 ENCOUNTER — Encounter: Payer: Self-pay | Admitting: Family Medicine

## 2021-06-05 ENCOUNTER — Encounter: Payer: Self-pay | Admitting: Family Medicine

## 2021-06-18 NOTE — Progress Notes (Signed)
[place] Phone: [phone] Subjective:   [name], am serving as a scribe for Dr. Hulan Saas.  This visit occurred during the SARS-CoV-2 public health emergency.  Safety protocols were in place, including screening questions prior to the visit, additional usage of staff PPE, and extensive cleaning of exam room while observing appropriate contact time as indicated for disinfecting solutions.   I'm seeing this patient by the request  of:  [name], DO  CC: Right ankle pain follow-up  XVQ:[id number]  03/07/2021 Chronic problem with exacerbation.  Repeat x-rays ordered today.  Discussed proper shoes, avoiding being barefoot at home, discussed range of motion exercises.  We did discuss the possibility of a PRP injection that could be something beneficial.  Patient given a handout.  Follow-up with me again 4 to 8 weeks  Updated 06/19/2021 FLEET HIGHAM is a 70 y.o. male coming in with complaint of right ankle pain. Patient states that ankle is better but is still not 100%.   His lower back pain has increased since last visit. Patient feels antalgic gait has contributed to back pain. Using Tramadol and Hydrocodone for pain relief for past 3 days. Pain can be 9/10. Denies any radiating symptoms.   Patient started taking Keytruda since last visit for metastatic cancer since last visit.        Past Medical History:  Diagnosis Date   Anxiety    Arthritis    bilateral knees   Depression    DM2 (diabetes mellitus, type 2) (HCC)    Dysrhythmia     benign tachycardia, takes Cartia   Elevated LFTs    previously evaluated by Dr Sharlett Iles; h/o "fatty liver"   GERD (gastroesophageal reflux disease)    HLD (hyperlipidemia)    Hypertension    Inguinal hernia    right   Squamous cell carcinoma    top of head   Past Surgical History:  Procedure Laterality Date   ACHILLES TENDON REPAIR  1999    BLADDER SURGERY     CARPAL TUNNEL RELEASE Right 11/04/2014   Procedure: LIMITED OPEN RIGHT CARPAL TUNNEL RELEASE;  Surgeon: Roseanne Kaufman, MD;  Location: [facility];  Service: Orthopedics;  Laterality: Right;   CARPAL TUNNEL WITH CUBITAL TUNNEL Left 07/22/2014   Procedure: LEFT CARPAL TUNNEL RELEASE AND CUBITAL TUNNEL RELEASE TRANSPOSITION;  Surgeon: Roseanne Kaufman, MD;  Location: [facility];  Service: Orthopedics;  Laterality: Left;   COLONOSCOPY     EYE SURGERY     laser eye surgery bilat    HERNIA REPAIR  07/2011   left side   INGUINAL HERNIA REPAIR  08/09/2011   Procedure: LAPAROSCOPIC INGUINAL HERNIA;  Surgeon: Joyice Faster. Cornett, MD;  Location: [facility];  Service: General;  Laterality: Right;   KNEE ARTHROSCOPY  1993   left knee   KNEE SURGERY     age 16- right   SQUAMOUS CELL CARCINOMA EXCISION     left knee   TOTAL KNEE ARTHROPLASTY Left 05/04/2019   Procedure: LEFT TOTAL KNEE ARTHROPLASTY;  Surgeon: Garald Balding, MD;  Location: [facility];  Service: Orthopedics;  Laterality: Left;   TOTAL KNEE ARTHROPLASTY Right 05/09/2020   Procedure: RIGHT TOTAL KNEE ARTHROPLASTY;  Surgeon: Garald Balding, MD;  Location: [facility];  Service: Orthopedics;  Laterality: Right;   Social History   Socioeconomic History   Marital status: Married    Spouse name: Not on  file   Number of children: 1   Years of education: Not on file   Highest education level: Not on file  Occupational History   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    Employer: [employer]  Tobacco Use   Smoking status: Some Days    Types: Cigars   Smokeless tobacco: Never   Tobacco comments:    smokes one every other day  Vaping Use   Vaping Use: Never used  Substance and Sexual Activity   Alcohol use: Yes    Alcohol/week: 3.0 - 4.0 standard drinks    Types: 3 - 4 Cans of beer per week    Comment: social   Drug use: No   Sexual activity: Yes     Partners: Female  Other Topics Concern   Not on file  Social History Narrative   Married to [place]   exercise--- not regularly   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   Is a Radio broadcast assistant   Occasional smoker   Social Determinants of Radio broadcast assistant Strain: Not on file  Food Insecurity: Not on file  Transportation Needs: Not on file  Physical Activity: Not on file  Stress: Not on file  Social Connections: Not on file   Allergies  Allergen Reactions   Meth-Hyo-M Bl-Na Phos-Ph Sal Other (See Comments)    [place] believes it may have contributed to renal failure accourding to his spouse who informed us at 17:17 01/19/2021   Codeine Nausea Only   Family History  Problem Relation Age of Onset   Diabetes Mother    Hypertension Mother    Heart disease Mother    Heart failure Mother    Stroke Father 71   Parkinsonism Father    Depression Brother    Alcohol abuse Brother    Cancer Maternal Grandmother        bone   Colon cancer Neg Hx    Colon polyps Neg Hx    Esophageal cancer Neg Hx    Rectal cancer Neg Hx    Stomach cancer Neg Hx     Current Outpatient Medications (Endocrine & Metabolic):    SitaGLIPtin-MetFORMIN HCl (JANUMET XR) 50-500 MG TB24, Take 1 tablet by mouth daily.  Current Outpatient Medications (Cardiovascular):    atorvastatin (LIPITOR) 20 MG tablet, Take 1 tablet (20 mg total) by mouth daily.   atorvastatin (LIPITOR) 40 MG tablet, TAKE 1 TABLET BY MOUTH DAILY.   diltiazem (CARDIZEM CD) 120 MG 24 hr capsule, Take 1 capsule (120 mg total) by mouth daily.   diltiazem (CARDIZEM CD) 120 MG 24 hr capsule, TAKE 1 CAPSULE BY MOUTH ONCE A DAY   lisinopril (ZESTRIL) 10 MG tablet, TAKE 1 TABLET BY MOUTH DAILY * NEEDS OFFICE VISIT   metoprolol tartrate (LOPRESSOR) 50 MG tablet, TAKE 1 TABLET BY MOUTH 2 TIMES DAILY   Current Outpatient Medications (Analgesics):    HYDROmorphone (DILAUDID) 2 MG tablet, Take 1-2 tablets (2-4 mg  total) by mouth every 6 (six) hours as needed for moderate pain or severe pain.   Current Outpatient Medications (Other):    amoxicillin (AMOXIL) 500 MG capsule, Take 4 capsules by mouth one hour prior to any dental procedures   blood glucose meter kit and supplies KIT, Dispense based on patient and insurance preference. Use up to four times daily as directed.   glucose blood (ONE TOUCH TEST STRIPS) test strip, Check blood sugar twice daily   nystatin-triamcinolone ointment (  MYCOLOG), Apply 1 application topically 2 (two) times daily.   ONETOUCH DELICA LANCETS FINE MISC, Use qd   phenazopyridine (PYRIDIUM) 95 MG tablet, Take 1 tablet (95 mg total) by mouth 3 (three) times daily as needed for Pain.   tamsulosin (FLOMAX) 0.4 MG CAPS capsule, Take 1 capsule (0.4 mg total) by mouth daily.   trospium (SANCTURA) 20 MG tablet, TAKE 1 TABLET BY MOUTH 2 TIMES DAILY FOR 10 DAYS   Reviewed prior external information including notes and imaging from  primary care provider As well as notes that were available from care everywhere and other healthcare systems.  This includes patient's previous notes from neurology and hematology being followed for his malignant bladder cancer.  Past medical history, social, surgical and family history all reviewed in electronic medical record.  No pertanent information unless stated regarding to the chief complaint.   Review of Systems:  No headache, visual changes, nausea, vomiting, diarrhea, constipation, dizziness, abdominal pain, skin rash, fevers, chills, night sweats, weight loss, swollen lymph nodes,  joint swelling, chest pain, shortness of breath, mood changes. POSITIVE muscle aches, body aches  Objective  There were no vitals taken for this visit.   General: No apparent distress alert and oriented x3 mood and affect normal, dressed appropriately.  HEENT: Pupils equal, extraocular movements intact  Respiratory: Patient's speak in full sentences and does not  appear short of breath  Cardiovascular: No lower extremity edema, non tender, no erythema  Gait still minorly antalgic MSK:  Right ankle exam shows arthritic changes noted.  Patient was walking a little better though. Low back exam does have some loss of lordosis.  Patient has difficulty going from a seated to standing position.  Negative straight leg test but does have tightness noted.  Neurovascularly intact distally.  Procedure: Real-time Ultrasound Guided Injection of right sacroiliac joint Device: GE Logiq Q7 Ultrasound guided injection is preferred based studies that show increased duration, increased effect, greater accuracy, decreased procedural pain, increased response rate, and decreased cost with ultrasound guided versus blind injection.  Verbal informed consent obtained.  Time-out conducted.  Noted no overlying erythema, induration, or other signs of local infection.  Skin prepped in a sterile fashion.  Local anesthesia: Topical Ethyl chloride.  With sterile technique and under real time ultrasound guidance: With a 21-gauge 2 inch needle patient was injected with 0.5 cc of 0.5% Marcaine and 1 cc of Kenalog 40 mg/mL. Completed without difficulty  Pain immediately improved suggesting accurate placement of the medication.  Advised to call if fevers/chills, erythema, induration, drainage, or persistent bleeding.  Impression: Technically successful ultrasound guided injection.   Impression and Recommendations:     The above documentation has been reviewed and is accurate and complete Lyndal Pulley, DO

## 2021-06-19 ENCOUNTER — Ambulatory Visit: Payer: Self-pay

## 2021-06-19 ENCOUNTER — Other Ambulatory Visit: Payer: Self-pay

## 2021-06-19 ENCOUNTER — Ambulatory Visit (INDEPENDENT_AMBULATORY_CARE_PROVIDER_SITE_OTHER): Payer: No Typology Code available for payment source | Admitting: Family Medicine

## 2021-06-19 ENCOUNTER — Ambulatory Visit (INDEPENDENT_AMBULATORY_CARE_PROVIDER_SITE_OTHER): Payer: No Typology Code available for payment source

## 2021-06-19 VITALS — BP 118/64 | HR 70 | Ht 67.0 in | Wt 192.0 lb

## 2021-06-19 DIAGNOSIS — R102 Pelvic and perineal pain: Secondary | ICD-10-CM

## 2021-06-19 DIAGNOSIS — M19071 Primary osteoarthritis, right ankle and foot: Secondary | ICD-10-CM | POA: Diagnosis not present

## 2021-06-19 DIAGNOSIS — M25571 Pain in right ankle and joints of right foot: Secondary | ICD-10-CM

## 2021-06-19 DIAGNOSIS — M545 Low back pain, unspecified: Secondary | ICD-10-CM | POA: Diagnosis not present

## 2021-06-19 DIAGNOSIS — M533 Sacrococcygeal disorders, not elsewhere classified: Secondary | ICD-10-CM

## 2021-06-19 NOTE — Assessment & Plan Note (Signed)
Patient is doing better but will consider the PRP injections if necessary at follow-up

## 2021-06-19 NOTE — Assessment & Plan Note (Signed)
Patient had more tenderness noted on the right side of the sacroiliac joint.  Patient noted does have muscle tightness noted as well.  With history of cancer we will get x-rays to make sure there is no significant bony abnormality.  In addition to this though patient is on some new medications that could be contributing to more of the back pain.  Discussed with patient that he is recently been on tramadol and to increase this with the Tylenol when necessary.  We discussed also though to watch for any worsening back pain he does need to seek medical attention to rule out things such as acute renal failure and rhabdomyolysis.  Patient recently did have labs held on labs today but did have hematuria but patient has had this since the bladder cancer.  Warned patient again worsening pain to seek medical attention otherwise we will follow-up with me again in 4 to 5 weeks.

## 2021-06-19 NOTE — Patient Instructions (Addendum)
Xray today Injected R SI joint Exercises If increase in blood in urine, seek medical attention See me again in

## 2021-06-24 ENCOUNTER — Encounter: Payer: Self-pay | Admitting: Family Medicine

## 2021-06-25 ENCOUNTER — Other Ambulatory Visit: Payer: Self-pay

## 2021-06-25 ENCOUNTER — Ambulatory Visit (INDEPENDENT_AMBULATORY_CARE_PROVIDER_SITE_OTHER): Payer: No Typology Code available for payment source | Admitting: Sports Medicine

## 2021-06-25 VITALS — BP 124/80 | HR 72 | Ht 67.0 in | Wt 194.0 lb

## 2021-06-25 DIAGNOSIS — Z8551 Personal history of malignant neoplasm of bladder: Secondary | ICD-10-CM | POA: Diagnosis not present

## 2021-06-25 DIAGNOSIS — M21371 Foot drop, right foot: Secondary | ICD-10-CM

## 2021-06-25 DIAGNOSIS — M25551 Pain in right hip: Secondary | ICD-10-CM | POA: Diagnosis not present

## 2021-06-25 MED ORDER — METHYLPREDNISOLONE ACETATE 80 MG/ML IJ SUSP
80.0000 mg | Freq: Once | INTRAMUSCULAR | Status: AC
  Administered 2021-06-25: 80 mg via INTRAMUSCULAR

## 2021-06-25 MED ORDER — KETOROLAC TROMETHAMINE 60 MG/2ML IM SOLN
60.0000 mg | Freq: Once | INTRAMUSCULAR | Status: AC
  Administered 2021-06-25: 60 mg via INTRAMUSCULAR

## 2021-06-25 NOTE — Patient Instructions (Addendum)
Good to see you  Referral for MRI for pelvis  Continue tylenol and tramadol for pain control  Start Robaxin once at night  Be cautious walking and be aware robaxin and tramadol cause drowsiness  [facility] [phone] Please follow up after MRI to review results

## 2021-06-25 NOTE — Progress Notes (Addendum)
Charles Trujillo D.[place] Phone: [phone]   Assessment and Plan:     1. Right hip pain 2. History of bladder cancer 3. Right foot drop -Acute, uncertain prognosis, subsequent sports medicine visit - Unclear etiology of right foot drop and severe right hip pain. 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 Rule out Pelvic metastasis  - Continue Tylenol for daily pain relief and tramadol for breakthrough pain - Start previously prescribed Robaxin nightly for pain relief.  Advised patient to be cautious as muscle relaxer and tramadol may make patient drowsy.  Advised to have walker or cane next to bedside - Toradol 60 mg-Depo-Medrol 80 mg IM injection provided in clinic today for acute pain relief, oncology office note 06/06/2021   Pertinent previous records reviewed include x-ray pelvis 06/19/2021, x-ray lumbar spine 06/19/2021   Follow Up: After MRI to discuss results   Subjective:   I, Charles Trujillo, am serving as a Education administrator for Doctor Glennon Mac  Chief Complaint: hip pain   HPI:   06/26/2021 Severe right hip pain even with meds. No back pain. Need to see you ASAP please. Not able to go to work due to pain when walking.Having trouble lifting R foot. 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   Current Outpatient Medications:    amoxicillin (AMOXIL) 500 MG capsule, Take 4 capsules by mouth one hour prior to any dental procedures, Disp: 12 capsule, Rfl: 0   atorvastatin (LIPITOR) 20 MG tablet, Take 1 tablet (20 mg total) by mouth daily., Disp: 90 tablet, Rfl: 1   atorvastatin (LIPITOR) 40 MG tablet, TAKE 1 TABLET BY MOUTH DAILY., Disp: 90 tablet, Rfl: 3   blood glucose meter kit and supplies KIT, Dispense based on patient and insurance preference. Use up to four times daily as directed., Disp: 1 each, Rfl: 0   diltiazem (CARDIZEM CD) 120 MG 24 hr capsule, Take 1 capsule (120 mg total) by mouth daily., Disp: 90 capsule, Rfl: 1   diltiazem (CARDIZEM CD) 120 MG 24 hr capsule, TAKE 1 CAPSULE BY MOUTH ONCE A DAY, Disp: 90 capsule, Rfl: 3   glucose blood (ONE TOUCH TEST STRIPS) test strip, Check blood sugar twice daily, Disp: 100 each, Rfl: 11   HYDROmorphone (DILAUDID) 2 MG tablet, Take 1-2 tablets (2-4 mg total) by mouth every 6 (six) hours as needed for moderate pain or severe pain., Disp: 45 tablet, Rfl: 0   INV-pembrolizumab [id number] ([id number]) 100 MG/4ML SOLN, Inject into the vein., Disp: , Rfl:    lisinopril (ZESTRIL) 10 MG tablet, TAKE 1 TABLET BY MOUTH DAILY * NEEDS OFFICE VISIT, Disp: 90 tablet, Rfl: 1   metoprolol tartrate (LOPRESSOR) 50 MG tablet, TAKE 1 TABLET BY MOUTH 2 TIMES DAILY, Disp: 180 tablet, Rfl: 3   ONETOUCH DELICA LANCETS FINE MISC, Use qd,  Disp: 100 each, Rfl: 3   phenazopyridine (PYRIDIUM) 95 MG tablet, Take 1 tablet (95 mg total) by mouth 3 (three) times daily as needed for Pain., Disp: 10 tablet, Rfl: 0   SitaGLIPtin-MetFORMIN HCl (JANUMET XR) 50-500 MG TB24, Take 1 tablet by mouth daily., Disp: 30 tablet, Rfl: 2   nystatin-triamcinolone ointment (MYCOLOG), Apply 1 application topically 2 (two) times daily., Disp: 30 g, Rfl: 1   tamsulosin (FLOMAX) 0.4 MG CAPS capsule, Take 1 capsule (0.4 mg total) by mouth  daily., Disp: 30 capsule, Rfl: 0   trospium (SANCTURA) 20 MG tablet, TAKE 1 TABLET BY MOUTH 2 TIMES DAILY FOR 10 DAYS, Disp: 20 tablet, Rfl: 0   Objective:     Vitals:   06/25/21 1355  BP: 124/80  Pulse: 72  SpO2: 97%  Weight: 194 lb (88 kg)  Height: '5\' 7"'  (1.702 m)      Body mass index is 30.38 kg/m.    Physical Exam:    General: awake, alert, and oriented no acute distress, nontoxic Skin: no suspicious lesions or rashes Neuro:sensation intact distally with no dificits, normal muscle tone, no atrophy.  Right foot dorsiflexion 3/5, otherwise strength 5/5 in all tested lower ext groups Psych: normal mood and affect, speech clear  Right hip: No deformity, swelling or wasting ROM Fexion 90, ext 10, IR 30, ER 30 TTP gluteal musculature NTTP over the hip flexors, greater troch, si joint, lumbar spine Negative log roll with FROM Negative FABER Negative FADIR Negative Piriformis test No deformity of right fibular head  Gait slowed, favoring left leg, with right foot drop   Electronically signed by:  Charles Trujillo D.[facility] 2:31 PM 06/25/21

## 2021-06-26 ENCOUNTER — Encounter: Payer: Self-pay | Admitting: Sports Medicine

## 2021-06-29 ENCOUNTER — Encounter: Payer: Self-pay | Admitting: Family Medicine

## 2021-07-02 ENCOUNTER — Other Ambulatory Visit: Payer: Self-pay

## 2021-07-02 ENCOUNTER — Ambulatory Visit (INDEPENDENT_AMBULATORY_CARE_PROVIDER_SITE_OTHER): Payer: No Typology Code available for payment source

## 2021-07-02 DIAGNOSIS — Z8551 Personal history of malignant neoplasm of bladder: Secondary | ICD-10-CM | POA: Diagnosis not present

## 2021-07-02 DIAGNOSIS — M21371 Foot drop, right foot: Secondary | ICD-10-CM | POA: Diagnosis not present

## 2021-07-02 DIAGNOSIS — M25551 Pain in right hip: Secondary | ICD-10-CM | POA: Diagnosis not present

## 2021-07-02 MED ORDER — GADOBUTROL 1 MMOL/ML IV SOLN
9.0000 mL | Freq: Once | INTRAVENOUS | Status: AC | PRN
  Administered 2021-07-02: 12:00:00 9 mL via INTRAVENOUS

## 2021-07-03 ENCOUNTER — Other Ambulatory Visit: Payer: Self-pay | Admitting: Sports Medicine

## 2021-07-03 DIAGNOSIS — M545 Low back pain, unspecified: Secondary | ICD-10-CM

## 2021-07-04 ENCOUNTER — Encounter (HOSPITAL_COMMUNITY): Payer: Self-pay

## 2021-07-04 ENCOUNTER — Other Ambulatory Visit: Payer: Self-pay

## 2021-07-04 ENCOUNTER — Emergency Department (HOSPITAL_COMMUNITY): Payer: PRIVATE HEALTH INSURANCE

## 2021-07-04 ENCOUNTER — Emergency Department (HOSPITAL_COMMUNITY)
Admission: EM | Admit: 2021-07-04 | Discharge: 2021-07-04 | Disposition: A | Discharge status: Home / Self Care | Payer: PRIVATE HEALTH INSURANCE | Attending: Emergency Medicine | Admitting: Emergency Medicine

## 2021-07-04 DIAGNOSIS — Z79899 Other long term (current) drug therapy: Secondary | ICD-10-CM | POA: Insufficient documentation

## 2021-07-04 DIAGNOSIS — Z85828 Personal history of other malignant neoplasm of skin: Secondary | ICD-10-CM | POA: Insufficient documentation

## 2021-07-04 DIAGNOSIS — E119 Type 2 diabetes mellitus without complications: Secondary | ICD-10-CM | POA: Diagnosis not present

## 2021-07-04 DIAGNOSIS — F17290 Nicotine dependence, other tobacco product, uncomplicated: Secondary | ICD-10-CM | POA: Diagnosis not present

## 2021-07-04 DIAGNOSIS — Z7984 Long term (current) use of oral hypoglycemic drugs: Secondary | ICD-10-CM | POA: Insufficient documentation

## 2021-07-04 DIAGNOSIS — R4182 Altered mental status, unspecified: Secondary | ICD-10-CM | POA: Diagnosis present

## 2021-07-04 DIAGNOSIS — Z8551 Personal history of malignant neoplasm of bladder: Secondary | ICD-10-CM | POA: Diagnosis not present

## 2021-07-04 DIAGNOSIS — I10 Essential (primary) hypertension: Secondary | ICD-10-CM | POA: Insufficient documentation

## 2021-07-04 DIAGNOSIS — Z96653 Presence of artificial knee joint, bilateral: Secondary | ICD-10-CM | POA: Diagnosis not present

## 2021-07-04 DIAGNOSIS — R413 Other amnesia: Secondary | ICD-10-CM | POA: Diagnosis not present

## 2021-07-04 LAB — CBC WITH DIFFERENTIAL/PLATELET
Abs Immature Granulocytes: 0.04 10*3/uL (ref 0.00–0.07)
Basophils Absolute: 0.1 10*3/uL (ref 0.0–0.1)
Basophils Relative: 1 %
Eosinophils Absolute: 0 10*3/uL (ref 0.0–0.5)
Eosinophils Relative: 0 %
HCT: 49 % (ref 39.0–52.0)
Hemoglobin: 16.2 g/dL (ref 13.0–17.0)
Immature Granulocytes: 0 %
Lymphocytes Relative: 10 %
Lymphs Abs: 1.1 10*3/uL (ref 0.7–4.0)
MCH: 29.3 pg (ref 26.0–34.0)
MCHC: 33.1 g/dL (ref 30.0–36.0)
MCV: 88.8 fL (ref 80.0–100.0)
Monocytes Absolute: 0.9 10*3/uL (ref 0.1–1.0)
Monocytes Relative: 8 %
Neutro Abs: 8.7 10*3/uL — ABNORMAL HIGH (ref 1.7–7.7)
Neutrophils Relative %: 81 %
Platelets: 246 10*3/uL (ref 150–400)
RBC: 5.52 MIL/uL (ref 4.22–5.81)
RDW: 16.7 % — ABNORMAL HIGH (ref 11.5–15.5)
WBC: 10.8 10*3/uL — ABNORMAL HIGH (ref 4.0–10.5)
nRBC: 0 % (ref 0.0–0.2)

## 2021-07-04 LAB — COMPREHENSIVE METABOLIC PANEL
ALT: 42 U/L (ref 0–44)
AST: 37 U/L (ref 15–41)
Albumin: 4 g/dL (ref 3.5–5.0)
Alkaline Phosphatase: 58 U/L (ref 38–126)
Anion gap: 6 (ref 5–15)
BUN: 12 mg/dL (ref 8–23)
CO2: 24 mmol/L (ref 22–32)
Calcium: 8.5 mg/dL — ABNORMAL LOW (ref 8.9–10.3)
Chloride: 106 mmol/L (ref 98–111)
Creatinine, Ser: 0.94 mg/dL (ref 0.61–1.24)
GFR, Estimated: >60 mL/min (ref >60)
Glucose, Bld: 124 mg/dL — ABNORMAL HIGH (ref 70–99)
Potassium: 3.8 mmol/L (ref 3.5–5.1)
Sodium: 136 mmol/L (ref 135–145)
Total Bilirubin: 0.6 mg/dL (ref 0.3–1.2)
Total Protein: 7.3 g/dL (ref 6.5–8.1)

## 2021-07-04 LAB — URINALYSIS, ROUTINE W REFLEX MICROSCOPIC
Bacteria, UA: NONE SEEN
Bilirubin Urine: NEGATIVE
Glucose, UA: NEGATIVE mg/dL
Ketones, ur: 5 mg/dL — AB
Nitrite: NEGATIVE
Protein, ur: 100 mg/dL — AB
RBC / HPF: >50 RBC/hpf — ABNORMAL HIGH (ref 0–5)
Specific Gravity, Urine: 1.031 — ABNORMAL HIGH (ref 1.005–1.030)
pH: 5 (ref 5.0–8.0)

## 2021-07-04 MED ORDER — HYDROCODONE-ACETAMINOPHEN 5-325 MG PO TABS: 1.0000 | ORAL_TABLET | ORAL | 0 refills | Status: DC | PRN

## 2021-07-04 NOTE — ED Triage Notes (Signed)
Pts family member reports that when the pt woke up this morning he was acting normally, but upon her return home around 12pm the pt was confused and disoriented. He is slow to answer questions, but can answer them. A&O x3. Equal grips, strength, sensation, and without facial droop of slurred speech.

## 2021-07-04 NOTE — Discharge Instructions (Addendum)
It is possible that you had a seizure causing your memory loss today.  To complete the work-up for that we recommended brain MRI, and EEG.  These can be scheduled as an outpatient by your primary care doctor.  Stop taking tramadol, since that can cause seizures.  In the meantime, for pain, use Tylenol 650 mg every 4 hours as needed.  We sent a prescription for hydrocodone with acetaminophen, to your pharmacy.  Since you may have had a seizure, you cannot drive, operate machinery or bathing alone because of the potential for seizure causing harm to yourself or others.  Do not resume these activities until released to do so by a physician.

## 2021-07-04 NOTE — ED Provider Notes (Signed)
[facility] DEPT Provider Note   CSN: [id number] Arrival date & time: 07/04/21  1359     History Chief Complaint  Patient presents with   Altered Mental Status    Charles Trujillo is a 70 y.o. male.  HPI Patient is here for evaluation of confusion, which was noted by his wife when she got home from an errand.  Patient had been at his baseline prior to that.  He is usually functional, employed, and has normal mentation.  Recently he has been troubled by lower back and right hip pain.  This was evaluated with MR imaging, earlier this week.  He is taking tramadol, 4 times a day, for pain in the low back and right hip.  He does not have a known seizure history.  No recent trauma.  Patient's wife reports that his confusion is improving, gradually but still he has problems remembering some things.  His confusion is essentially difficulty remembering facts such as getting the MRI, 4 days ago.  There is been no recent trauma, fever, chills, nausea or vomiting.  There are no other known active modifying factors.  Past Medical History:  Diagnosis Date   Anxiety    Arthritis    bilateral knees   Depression    DM2 (diabetes mellitus, type 2) ([place])    Dysrhythmia     benign tachycardia, takes Cartia   Elevated LFTs    previously evaluated by Dr Sharlett Iles; h/o "fatty liver"   GERD (gastroesophageal reflux disease)    HLD (hyperlipidemia)    Hypertension    Inguinal hernia    right   Squamous cell carcinoma    top of head    Patient Active Problem List   Diagnosis Date Noted   Low back pain 06/19/2021   Malignant neoplasm of overlapping sites of bladder ([place]) 08/15/2020   Total knee replacement status, right 05/09/2020   Uncontrolled type 2 diabetes mellitus with hyperglycemia ([place]) 03/14/2020   Degenerative arthritis of right knee 03/09/2020   Arthritis of right ankle 03/09/2020   Pain in right ankle and joints of right foot 10/13/2019   Total knee  replacement status, left 05/18/2019   Osteoarthritis of left knee 05/04/2019   Unilateral primary osteoarthritis, left knee 04/20/2019   Hyperlipidemia associated with type 2 diabetes mellitus ([place]) 05/19/2018   Degenerative arthritis of knee, bilateral 06/20/2017   Left inguinal pain 11/06/2015   Dysuria 03/27/2015   Fatigue 03/27/2015   Benign paroxysmal positional vertigo 04/22/2014   Dizziness 04/22/2014   Primary localized osteoarthrosis, lower leg 09/15/2013   Obesity (BMI 30-39.9) 08/19/2013   Other and unspecified hyperlipidemia 02/19/2013   HLD (hyperlipidemia) 03/12/2012   Tachycardia 03/06/2012   Inguinal hernia unilateral, non-recurrent 08/02/2011   Inguinal hernia 06/26/2011   Preventative health care 02/01/2011   DM (diabetes mellitus) type II uncontrolled, periph vascular disorder 10/20/2009   CARPAL TUNNEL SYNDROME, BILATERAL 10/03/2009   ERECTILE DYSFUNCTION, ORGANIC, HX OF 02/06/2009   DEPRESSION 01/20/2008   Essential hypertension 01/20/2008   URI 06/25/2007   TRANSAMINASES, SERUM, ELEVATED 02/02/2007    Past Surgical History:  Procedure Laterality Date   ACHILLES TENDON REPAIR  1999   BLADDER SURGERY     CARPAL TUNNEL RELEASE Right 11/04/2014   Procedure: LIMITED OPEN RIGHT CARPAL TUNNEL RELEASE;  Surgeon: Roseanne Kaufman, MD;  Location: [facility];  Service: Orthopedics;  Laterality: Right;   CARPAL TUNNEL WITH CUBITAL TUNNEL Left 07/22/2014   Procedure: LEFT CARPAL TUNNEL RELEASE AND CUBITAL  TUNNEL RELEASE TRANSPOSITION;  Surgeon: Roseanne Kaufman, MD;  Location: [facility];  Service: Orthopedics;  Laterality: Left;   COLONOSCOPY     EYE SURGERY     laser eye surgery bilat    HERNIA REPAIR  07/2011   left side   INGUINAL HERNIA REPAIR  08/09/2011   Procedure: LAPAROSCOPIC INGUINAL HERNIA;  Surgeon: Joyice Faster. Cornett, MD;  Location: [facility];  Service: General;  Laterality: Right;   KNEE ARTHROSCOPY  1993   left knee   KNEE SURGERY      age 60- right   SQUAMOUS CELL CARCINOMA EXCISION     left knee   TOTAL KNEE ARTHROPLASTY Left 05/04/2019   Procedure: LEFT TOTAL KNEE ARTHROPLASTY;  Surgeon: Garald Balding, MD;  Location: [facility];  Service: Orthopedics;  Laterality: Left;   TOTAL KNEE ARTHROPLASTY Right 05/09/2020   Procedure: RIGHT TOTAL KNEE ARTHROPLASTY;  Surgeon: Garald Balding, MD;  Location: [facility];  Service: Orthopedics;  Laterality: Right;       Family History  Problem Relation Age of Onset   Diabetes Mother    Hypertension Mother    Heart disease Mother    Heart failure Mother    Stroke Father 4   Parkinsonism Father    Depression Brother    Alcohol abuse Brother    Cancer Maternal Grandmother        bone   Colon cancer Neg Hx    Colon polyps Neg Hx    Esophageal cancer Neg Hx    Rectal cancer Neg Hx    Stomach cancer Neg Hx     Social History   Tobacco Use   Smoking status: Some Days    Types: Cigars   Smokeless tobacco: Never   Tobacco comments:    smokes one every other day  Vaping Use   Vaping Use: Never used  Substance Use Topics   Alcohol use: Yes    Alcohol/week: 3.0 - 4.0 standard drinks    Types: 3 - 4 Cans of beer per week    Comment: social   Drug use: No    Home Medications Prior to Admission medications   Medication Sig Start Date End Date Taking? Authorizing Provider  amoxicillin (AMOXIL) 500 MG capsule Take 4 capsules by mouth one hour prior to any dental procedures 03/08/21  Yes Garald Balding, MD  HYDROcodone-acetaminophen (NORCO) 5-325 MG tablet Take 1 tablet by mouth every 4 (four) hours as needed for moderate pain or severe pain. 07/04/21  Yes Daleen Bo, MD  atorvastatin (LIPITOR) 20 MG tablet Take 1 tablet (20 mg total) by mouth daily. 03/14/20   Roma Schanz R, DO  atorvastatin (LIPITOR) 40 MG tablet TAKE 1 TABLET BY MOUTH DAILY. 09/01/20 09/01/21  Reginia Forts, FNP  blood glucose meter kit and supplies KIT Dispense based on patient  and insurance preference. Use up to four times daily as directed. 02/23/21   [name], DO  diltiazem (CARDIZEM CD) 120 MG 24 hr capsule Take 1 capsule (120 mg total) by mouth daily. 03/29/20   Roma Schanz R, DO  diltiazem (CARDIZEM CD) 120 MG 24 hr capsule TAKE 1 CAPSULE BY MOUTH ONCE A DAY 09/29/20 09/29/21  Maia Plan Prak, FNP  glucose blood (ONE TOUCH TEST STRIPS) test strip Check blood sugar twice daily 03/25/17   Carollee Herter, Alferd Apa, DO  INV-pembrolizumab [phone]) 100 MG/4ML SOLN Inject into the vein.    [provider]  lisinopril (ZESTRIL) 10 MG tablet TAKE 1 TABLET BY MOUTH DAILY * NEEDS OFFICE VISIT 08/31/20 08/31/21  Carollee Herter, Kendrick Fries R, DO  metoprolol tartrate (LOPRESSOR) 50 MG tablet TAKE 1 TABLET BY MOUTH 2 TIMES DAILY 09/01/20 09/01/21  Reginia Forts, FNP  nystatin-triamcinolone ointment (MYCOLOG) Apply 1 application topically 2 (two) times daily. 11/17/19   Gatha Mayer, MD  [facility] DELICA LANCETS FINE MISC Use qd 03/25/17   Carollee Herter, Alferd Apa, DO  phenazopyridine (PYRIDIUM) 95 MG tablet Take 1 tablet (95 mg total) by mouth 3 (three) times daily as needed for Pain. 12/07/20     SitaGLIPtin-MetFORMIN HCl (JANUMET XR) 50-500 MG TB24 Take 1 tablet by mouth daily. 05/29/21   [name], DO  tamsulosin (FLOMAX) 0.4 MG CAPS capsule Take 1 capsule (0.4 mg total) by mouth daily. 07/25/20   Palumbo, April, MD  trospium (SANCTURA) 20 MG tablet TAKE 1 TABLET BY MOUTH 2 TIMES DAILY FOR 10 DAYS 08/03/20 08/03/21  Rosario Jacks, MD    Allergies    Meth-hyo-m bl-na phos-ph sal and Codeine  Review of Systems   Review of Systems  All other systems reviewed and are negative.  Physical Exam Updated Vital Signs BP (!) 167/88    Pulse 76    Temp 98.2 F (36.8 C) (Oral)    Resp 14    Ht '5\' 7"'  (1.702 m)    Wt 88 kg    SpO2 96%    BMI 30.38 kg/m   Physical Exam Vitals and nursing note reviewed.  Constitutional:      General: He is not in  acute distress.    Appearance: He is well-developed. He is not ill-appearing, toxic-appearing or diaphoretic.  HENT:     Head: Normocephalic and atraumatic.     Right Ear: External ear normal.     Left Ear: External ear normal.  Eyes:     Conjunctiva/sclera: Conjunctivae normal.     Pupils: Pupils are equal, round, and reactive to light.  Neck:     Trachea: Phonation normal.  Cardiovascular:     Rate and Rhythm: Normal rate and regular rhythm.     Heart sounds: Normal heart sounds.  Pulmonary:     Effort: Pulmonary effort is normal.     Breath sounds: Normal breath sounds.  Abdominal:     Palpations: Abdomen is soft.     Tenderness: There is no abdominal tenderness.  Musculoskeletal:        General: Normal range of motion.     Cervical back: Normal range of motion and neck supple.  Skin:    General: Skin is warm and dry.  Neurological:     Mental Status: He is alert and oriented to person, place, and time.     Cranial Nerves: No cranial nerve deficit.     Sensory: No sensory deficit.     Motor: No abnormal muscle tone.     Coordination: Coordination normal.     Comments: No dysarthria, aphasia or nystagmus.  No pronator drift.  No ataxia.  Psychiatric:        Mood and Affect: Mood normal.        Behavior: Behavior normal.        Thought Content: Thought content normal.        Judgment: Judgment normal.    ED Results / Procedures / Treatments   Labs (all labs ordered are listed, but only abnormal results are displayed) Labs Reviewed  COMPREHENSIVE METABOLIC PANEL -  Abnormal; Notable for the following components:      Result Value   Glucose, Bld 124 (*)    Calcium 8.5 (*)    All other components within normal limits  CBC WITH DIFFERENTIAL/PLATELET - Abnormal; Notable for the following components:   WBC 10.8 (*)    RDW 16.7 (*)    Neutro Abs 8.7 (*)    All other components within normal limits  URINALYSIS, ROUTINE W REFLEX MICROSCOPIC    EKG None  Radiology CT  Head Wo Contrast  Result Date: 07/04/2021 CLINICAL DATA:  Mental status change. EXAM: CT HEAD WITHOUT CONTRAST TECHNIQUE: Contiguous axial images were obtained from the base of the skull through the vertex without intravenous contrast. COMPARISON:  None. FINDINGS: Brain: No acute intracranial hemorrhage. No focal mass lesion. No CT evidence of acute infarction. No midline shift or mass effect. No hydrocephalus. Basilar cisterns are patent. There are periventricular and subcortical white matter hypodensities. Generalized cortical atrophy. Vascular: No hyperdense vessel or unexpected calcification. Skull: Normal. Negative for fracture or focal lesion. Sinuses/Orbits: Paranasal sinuses and mastoid air cells are clear. Orbits are clear. Other: None. IMPRESSION: 1. No acute intracranial findings. 2. Mild atrophy and white matter microvascular disease. Electronically Signed   By: Suzy Bouchard M.D.   On: 07/04/2021 16:15    Procedures Procedures   Medications Ordered in ED Medications - No data to display  ED Course  I have reviewed the triage vital signs and the nursing notes.  Pertinent labs & imaging results that were available during my care of the patient were reviewed by me and considered in my medical decision making (see chart for details).    MDM Rules/Calculators/A&P                          Patient Vitals for the past 24 hrs:  BP Temp Temp src Pulse Resp SpO2 Height Weight  07/04/21 1630 (!) 167/88 -- -- 76 14 96 % -- --  07/04/21 1600 (!) 163/105 -- -- 78 (!) 21 97 % -- --  07/04/21 1545 (!) 162/98 -- -- 79 (!) 9 98 % -- --  07/04/21 1515 (!) 143/93 -- -- 84 15 97 % -- --  07/04/21 1433 (!) 163/92 98.2 F (36.8 C) Oral (!) 112 18 98 % '5\' 7"'  (1.702 m) 88 kg  07/04/21 1412 (!) 162/105 97.7 F (36.5 C) Oral (!) 125 18 95 % -- --    5:03 PM Reevaluation with update and discussion. After initial assessment and treatment, an updated evaluation reveals no seizure in ED.  Patient's  wife reports that his memory loss is gradually improving but still not resolved.  Offered patient to do a brain MRI here he prefers to have done as an outpatient.  To complete his work-up, he requires brain MRI and EEG.  He will call his PCP to arrange for that.  He understands that he should stop taking tramadol.  He states he can use Tylenol until he gets a prescription sent to him from his pharmacy.  All findings discussed and questions answered. Daleen Bo   Medical Decision Making:  This patient is presenting for evaluation of loss, which does require a range of treatment options, and is a complaint that involves a moderate risk of morbidity and mortality. The differential diagnoses include transient global amnesia, CVA, metabolic disorder. I decided to review old records, and in summary patchy memory loss onset today.  He has a history  of nonmetastatic bladder cancer.  Had a pertinent history includes diabetes, hyperlipidemia, depression, and pertinent treatment with narcotic medications.  No prior history of seizures I obtained additional historical information from wife at bedside.  Clinical Laboratory Tests Ordered, included CBC, Metabolic panel, and Urinalysis. Review indicates normal except blood in urine with RBCs. Radiologic Tests Ordered, included CT head.  I independently Visualized: Radiographic images, which show no acute abnormalities  Cardiac Monitor Tracing which shows normal sinus rhythm    Critical Interventions-clinical evaluation, radiography, laboratory testing, observation and reassessment  After These Interventions, the Patient was reevaluated and was found with gradually improving neurologic status.  Patient is verbal has relatively intact memory and has nontoxic appearance.  He does not have any symptoms of acute infections including urine, and respiratory.  He has never had seizures before.  He is currently taking tramadol for pain which can potentiate seizures.  He  needs further work-up which can be done as an outpatient by his primary care provider.  Patient is agreeable to this management plan.  CRITICAL CARE-no Performed by: Daleen Bo  Nursing Notes Reviewed/ Care Coordinated Applicable Imaging Reviewed Interpretation of Laboratory Data incorporated into ED treatment  The patient appears reasonably screened and/or stabilized for discharge and I doubt any other medical condition or other [facility] requiring further screening, evaluation, or treatment in the ED at this time prior to discharge.  Plan: Home Medications-continue usual except stop tramadol, prescribing Norco for pain; Home Treatments-rest, no driving; return here if the recommended treatment, does not improve the symptoms; Recommended follow up-PCP for further management.  Consider neurology referral.        Final Clinical Impression(s) / ED Diagnoses Final diagnoses:  Memory loss    Rx / DC Orders ED Discharge Orders          Ordered    HYDROcodone-acetaminophen (NORCO) 5-325 MG tablet  Every 4 hours PRN        07/04/21 1700             Daleen Bo, MD 07/06/21 [phone]

## 2021-07-06 ENCOUNTER — Encounter: Payer: Self-pay | Admitting: Sports Medicine

## 2021-07-10 ENCOUNTER — Ambulatory Visit: Payer: No Typology Code available for payment source | Admitting: Medical

## 2021-07-10 ENCOUNTER — Encounter: Payer: Self-pay | Admitting: Medical

## 2021-07-10 VITALS — BP 130/70 | HR 99 | Temp 97.9°F | Resp 18 | Ht 67.0 in | Wt 188.8 lb

## 2021-07-10 DIAGNOSIS — R5383 Other fatigue: Secondary | ICD-10-CM | POA: Diagnosis not present

## 2021-07-10 DIAGNOSIS — R350 Frequency of micturition: Secondary | ICD-10-CM

## 2021-07-10 DIAGNOSIS — R4182 Altered mental status, unspecified: Secondary | ICD-10-CM

## 2021-07-10 DIAGNOSIS — R801 Persistent proteinuria, unspecified: Secondary | ICD-10-CM

## 2021-07-10 DIAGNOSIS — E559 Vitamin D deficiency, unspecified: Secondary | ICD-10-CM | POA: Diagnosis not present

## 2021-07-10 DIAGNOSIS — R410 Disorientation, unspecified: Secondary | ICD-10-CM

## 2021-07-10 DIAGNOSIS — R413 Other amnesia: Secondary | ICD-10-CM | POA: Diagnosis not present

## 2021-07-10 DIAGNOSIS — E538 Deficiency of other specified B group vitamins: Secondary | ICD-10-CM | POA: Diagnosis not present

## 2021-07-10 LAB — POC URINALSYSI DIPSTICK (AUTOMATED)
Glucose, UA: NEGATIVE
Leukocytes, UA: NEGATIVE
Nitrite, UA: NEGATIVE
Protein, UA: POSITIVE — AB
Spec Grav, UA: >=1.03 — AB (ref 1.010–1.025)
Urobilinogen, UA: 0.2 E.U./dL
pH, UA: 5 (ref 5.0–8.0)

## 2021-07-10 LAB — VITAMIN D 25 HYDROXY (VIT D DEFICIENCY, FRACTURES): VITD: 39.13 ng/mL (ref 30.00–100.00)

## 2021-07-10 LAB — PSA: PSA: 0.19 ng/mL (ref 0.10–4.00)

## 2021-07-10 LAB — VITAMIN B12: Vitamin B-12: 260 pg/mL (ref 211–911)

## 2021-07-10 NOTE — Patient Instructions (Addendum)
Altered mental status, disorientation and memory loss for approximate 4-hour on the day you were evaluated in the emergency department.  You had been on tramadol at that time and this may have played a role in the event.  Emergency department thinks work-up for potential seizure indicated.  I think it is worthwhile to investigate EEG and MRI of the head.  Decided to go ahead and refer to neurologist.  Presently have normal neurologic exam and Mini-Mental status score of 30.  Pending referral to neurologist if he has any further events as before please let me know.  In that event could try to expedite neurology referral.  If you have any dramatic events as previously then recommend be seen in the emergency department as they could do MRI stat.  Note for recurrent events larger hospitals typically would have MRI available.  Med centers do not always have MRI availability.  For frequent urination decided to get urine culture and PSA.  For fatigue decided to add B12, vitamin D and B1 level.  Since you do have persistent protein in the urine I do want you to mention this to your oncologist.  I think it would be worthwhile to get routine nephrology follow-up as you do have history of acute renal failure event in the past.  Recently kidney function looked good.  Follow-up in 1 month or sooner/as needed.

## 2021-07-10 NOTE — Addendum Note (Signed)
Addended by: Anabel Halon on: 07/10/2021 11:58 AM   Modules accepted: Orders

## 2021-07-10 NOTE — Progress Notes (Signed)
Subjective:    Patient ID: Charles Trujillo, male    DOB: December 14, 1950, 70 y.o.   MRN: [id number]  HPI Pt seen for follow up in the ED.  Seen on Arrival date & time: 07/04/21 Below in " hpi and A/P from the ED.   "Patient is here for evaluation of confusion, which was noted by his wife when she got home from an errand.  Patient had been at his baseline prior to that.  He is usually functional, employed, and has normal mentation.  Recently he has been troubled by lower back and right hip pain.  This was evaluated with MR imaging, earlier this week.  He is taking tramadol, 4 times a day, for pain in the low back and right hip.  He does not have a known seizure history.  No recent trauma.  Patient's wife reports that his confusion is improving, gradually but still he has problems remembering some things.  His confusion is essentially difficulty remembering facts such as getting the MRI, 4 days ago.  There is been no recent trauma, fever, chills, nausea or vomiting.  There are no other known active modifying factors."  Neuro in ED.  "Neurological:     Mental Status: He is alert and oriented to person, place, and time.     Cranial Nerves: No cranial nerve deficit.     Sensory: No sensory deficit.     Motor: No abnormal muscle tone.     Coordination: Coordination normal.     Comments: No dysarthria, aphasia or nystagmus.  No pronator drift.  No ataxia. "  "CT head. IMPRESSION: 1. No acute intracranial findings. 2. Mild atrophy and white matter microvascular disease."  "5:03 PM Reevaluation with update and discussion. After initial assessment and treatment, an updated evaluation reveals no seizure in ED.  Patient's wife reports that his memory loss is gradually improving but still not resolved.  Offered patient to do a brain MRI here he prefers to have done as an outpatient.  To complete his work-up, he requires brain MRI and EEG.  He will call his PCP to arrange for that.  He understands  that he should stop taking tramadol.  He states he can use Tylenol until he gets a prescription sent to him from his pharmacy.  All findings discussed and questions answered. Daleen Bo    Medical Decision Making:  This patient is presenting for evaluation of loss, which does require a range of treatment options, and is a complaint that involves a moderate risk of morbidity and mortality. The differential diagnoses include transient global amnesia, CVA, metabolic disorder. I decided to review old records, and in summary patchy memory loss onset today.  He has a history of nonmetastatic bladder cancer.  Had a pertinent history includes diabetes, hyperlipidemia, depression, and pertinent treatment with narcotic medications.  No prior history of seizures I obtained additional historical information from wife at bedside.   Clinical Laboratory Tests Ordered, included CBC, Metabolic panel, and Urinalysis. Review indicates normal except blood in urine with RBCs. Radiologic Tests Ordered, included CT head.  I independently Visualized: Radiographic images, which show no acute abnormalities   Cardiac Monitor Tracing which shows normal sinus rhythm       Critical Interventions-clinical evaluation, radiography, laboratory testing, observation and reassessment   After These Interventions, the Patient was reevaluated and was found with gradually improving neurologic status.  Patient is verbal has relatively intact memory and has nontoxic appearance.  He does not have any  of acute infections including urine, and respiratory.  He has never had seizures before.  He is currently taking tramadol for pain which can potentiate seizures.  He needs further work-up which can be done as an outpatient by his primary care provider.  Patient is agreeable to this management plan." ° ° °MMSE- 30. ° °Wife summarizes he appeared confused on day of ED visit. Was disoriented. He had forgotten on sons recent accident. He  had forgotten about recent mri of low back. He had forgotten he had coffee that day.  ° °Pt had been on tramadol for 2 weeks prior to event.  ° °6 days has passed since the ED visit.  ° ° °Pt was on tramadol. He stopped this and is now on hydrocodone.  ° °Mri- Limited view of this region on the sagittal images suggests likely °impingement at L4-5. ° °Over last week he feel normal. Oriented. He has been working full time at [employer]. He helps patients get medicaid. Which pt describes as can be complicated.  ° ° °Pt has hx of blood in urine. He has been followed by urologist. On review some protein in his urine as well.  ° °Some frequency of urination.  ° ° ° ° ° ° °Review of Systems  °Constitutional:  Negative for chills, fatigue and fever.  °HENT:  Negative for congestion and drooling.   °Respiratory:  Negative for cough, chest tightness, shortness of breath and wheezing.   °Cardiovascular:  Negative for chest pain and palpitations.  °Gastrointestinal:  Negative for abdominal pain, blood in stool, diarrhea and rectal pain.  °Genitourinary:  Negative for dysuria.  °Musculoskeletal:  Negative for back pain.  °Neurological:  Negative for dizziness, seizures, syncope, speech difficulty, weakness and headaches.  °     4 hour space of time in day went to ED that remembers nothing.  ° ° °Past Medical History:  °Diagnosis Date  ° Anxiety   ° Arthritis   ° bilateral knees  ° Depression   ° DM2 (diabetes mellitus, type 2) (HCC)   ° Dysrhythmia   °  benign tachycardia, takes Cartia  ° Elevated LFTs   ° previously evaluated by Dr Patterson; h/o "fatty liver"  ° GERD (gastroesophageal reflux disease)   ° HLD (hyperlipidemia)   ° Hypertension   ° Inguinal hernia   ° right  ° Squamous cell carcinoma   ° top of head  ° °  °Social History  ° °Socioeconomic History  ° Marital status: Married  °  Spouse name: Not on file  ° Number of children: 1  ° Years of education: Not on file  ° Highest education level: Not on file  °Occupational  History  ° 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  °  Employer: [employer]  °Tobacco Use  ° Smoking status: Some Days  °  Types: Cigars  ° Smokeless tobacco: Never  ° Tobacco comments:  °  smokes one every other day  °Vaping Use  ° Vaping Use: Never used  °Substance and Sexual Activity  ° Alcohol use: Yes  °  Alcohol/week: 3.0 - 4.0 standard drinks  °  Types: 3 - 4 Cans of beer per week  °  Comment: social  ° Drug use: No  ° Sexual activity: Yes  °  Partners: Female  °Other Topics Concern  ° Not on file  °Social History Narrative  ° Married to Jilda  ° exercise--- not regularly  ° Works at [employer]   is a Medicaid program specialist  ° Is a paralegal  ° Occasional smoker  ° °Social Determinants of Health  ° °Financial Resource Strain: Not on file  °Food Insecurity: Not on file  °Transportation Needs: Not on file  °Physical Activity: Not on file  °Stress: Not on file  °Social Connections: Not on file  °Intimate Partner Violence: Not on file  ° ° °Past Surgical History:  °Procedure Laterality Date  ° ACHILLES TENDON REPAIR  1999  ° BLADDER SURGERY    ° CARPAL TUNNEL RELEASE Right 11/04/2014  ° Procedure: LIMITED OPEN RIGHT CARPAL TUNNEL RELEASE;  Surgeon: Tremel Gramig, MD;  Location: [facility];  Service: Orthopedics;  Laterality: Right;  ° CARPAL TUNNEL WITH CUBITAL TUNNEL Left 07/22/2014  ° Procedure: LEFT CARPAL TUNNEL RELEASE AND CUBITAL TUNNEL RELEASE TRANSPOSITION;  Surgeon: Lenville Gramig, MD;  Location: [facility];  Service: Orthopedics;  Laterality: Left;  ° COLONOSCOPY    ° EYE SURGERY    ° laser eye surgery bilat   ° HERNIA REPAIR  07/2011  ° left side  ° INGUINAL HERNIA REPAIR  08/09/2011  ° Procedure: LAPAROSCOPIC INGUINAL HERNIA;  Surgeon: Thomas A. Cornett, MD;  Location: [facility];  Service: General;  Laterality: Right;  ° KNEE ARTHROSCOPY  1993  ° left knee  ° KNEE SURGERY    ° age 14- right  ° SQUAMOUS CELL CARCINOMA  EXCISION    ° left knee  ° TOTAL KNEE ARTHROPLASTY Left 05/04/2019  ° Procedure: LEFT TOTAL KNEE ARTHROPLASTY;  Surgeon: Whitfield, Peter W, MD;  Location: [facility];  Service: Orthopedics;  Laterality: Left;  ° TOTAL KNEE ARTHROPLASTY Right 05/09/2020  ° Procedure: RIGHT TOTAL KNEE ARTHROPLASTY;  Surgeon: Whitfield, Peter W, MD;  Location: [facility];  Service: Orthopedics;  Laterality: Right;  ° ° °Family History  °Problem Relation Age of Onset  ° Diabetes Mother   ° Hypertension Mother   ° Heart disease Mother   ° Heart failure Mother   ° Stroke Father 65  ° Parkinsonism Father   ° Depression Brother   ° Alcohol abuse Brother   ° Cancer Maternal Grandmother   °     bone  ° Colon cancer Neg Hx   ° Colon polyps Neg Hx   ° Esophageal cancer Neg Hx   ° Rectal cancer Neg Hx   ° Stomach cancer Neg Hx   ° ° °Allergies  °Allergen Reactions  ° Meth-Hyo-M Bl-Na Phos-Ph Sal Other (See Comments)  °  [facility] believes it may have contributed to renal failure accourding to his spouse who informed us at 17:17 01/19/2021  ° Codeine Nausea Only  ° ° °Current Outpatient Medications on File Prior to Visit  °Medication Sig Dispense Refill  ° amoxicillin (AMOXIL) 500 MG capsule Take 4 capsules by mouth one hour prior to any dental procedures 12 capsule 0  ° atorvastatin (LIPITOR) 40 MG tablet TAKE 1 TABLET BY MOUTH DAILY. 90 tablet 3  ° blood glucose meter kit and supplies KIT Dispense based on patient and insurance preference. Use up to four times daily as directed. 1 each 0  ° diltiazem (CARDIZEM CD) 120 MG 24 hr capsule TAKE 1 CAPSULE BY MOUTH ONCE A DAY 90 capsule 3  ° glucose blood (ONE TOUCH TEST STRIPS) test strip Check blood sugar twice daily 100 each 11  ° HYDROcodone-acetaminophen (NORCO) 5-325 MG tablet Take 1 tablet by mouth every 4 (four) hours as needed for moderate pain or severe pain. 20 tablet   0  ° INV-pembrolizumab [id number] ([id number]) 100 MG/4ML SOLN Inject into the vein.    ° lisinopril (ZESTRIL) 10 MG tablet TAKE 1 TABLET BY  MOUTH DAILY * NEEDS OFFICE VISIT 90 tablet 1  ° metoprolol tartrate (LOPRESSOR) 50 MG tablet TAKE 1 TABLET BY MOUTH 2 TIMES DAILY 180 tablet 3  ° ONETOUCH DELICA LANCETS FINE MISC Use qd 100 each 3  ° phenazopyridine (PYRIDIUM) 95 MG tablet Take 1 tablet (95 mg total) by mouth 3 (three) times daily as needed for Pain. 10 tablet 0  ° SitaGLIPtin-MetFORMIN HCl (JANUMET XR) 50-500 MG TB24 Take 1 tablet by mouth daily. 30 tablet 2  ° atorvastatin (LIPITOR) 20 MG tablet Take 1 tablet (20 mg total) by mouth daily. 90 tablet 1  ° diltiazem (CARDIZEM CD) 120 MG 24 hr capsule Take 1 capsule (120 mg total) by mouth daily. 90 capsule 1  ° nystatin-triamcinolone ointment (MYCOLOG) Apply 1 application topically 2 (two) times daily. 30 g 1  ° tamsulosin (FLOMAX) 0.4 MG CAPS capsule Take 1 capsule (0.4 mg total) by mouth daily. 30 capsule 0  ° trospium (SANCTURA) 20 MG tablet TAKE 1 TABLET BY MOUTH 2 TIMES DAILY FOR 10 DAYS 20 tablet 0  ° °No current facility-administered medications on file prior to visit.  ° ° °BP (!) 153/81    Pulse 99    Temp 97.9 °F (36.6 °C)    Resp 18    Ht 5' 7" (1.702 m)    Wt 188 lb 12.8 oz (85.6 kg)    SpO2 97%    BMI 29.57 kg/m²  °  °   °Objective:  ° Physical Exam ° °General °Mental Status- Alert. General Appearance- Not in acute distress.  ° °Skin °General: Color- Normal Color. Moisture- Normal Moisture. ° °Neck °Carotid Arteries- Normal color. Moisture- Normal Moisture. No carotid bruits. No JVD. ° °Chest and Lung Exam °Auscultation: °Breath Sounds:-Normal. ° °Cardiovascular °Auscultation:Rythm- Regular. °Murmurs & Other Heart Sounds:Auscultation of the heart reveals- No Murmurs. ° °Abdomen °Inspection:-Inspeection Normal. °Palpation/Percussion:Note:No mass. Palpation and Percussion of the abdomen reveal- Non Tender, Non Distended + BS, no rebound or guarding. ° ° °Neurologic °Cranial Nerve exam:- CN III-XII intact(No nystagmus), symmetric smile. °Drift Test:- No drift. °Romberg Exam:- Negative.   °Heal to Toe Gait exam:-difficult due to back pain and rt foot drop from back. °Finger to Nose:- Normal/Intact °Strength:- 5/5 equal and symmetric strength both upper and lower extremities.  ° ° °   °Assessment & Plan:  ° °Patient Instructions  °Altered mental status, disorientation and memory loss for approximate 4-hour.  On the day you were evaluated in the emergency department.  You had been on tramadol at that time and this may have played a role in the event.  Emergency department thinks work-up for potential seizure indicated.  I think it is worthwhile to investigate EEG and MRI of the head.  Decided to go ahead and refer to neurologist.  Presently have normal neurologic exam and Mini-Mental status score of 30.  Pending referral to neurologist if he has any further events as before please let me know.  In that event could try to expedite neurology referral.  If you have any dramatic events as previously then recommend be seen in the emergency department as they could do MRI stat.  Note for recurrent events larger hospitals typically would have MRI available.  Med centers do not always have MRI availability. ° °For frequent urination decided to get urine culture and PSA. ° °For fatigue decided   decided to add B12, vitamin D and B1 level.  Since you do have persistent protein in the urine I do want you to mention this to your oncologist.  I think it would be worthwhile to get routine nephrology follow-up as you do have history of acute renal failure event in the past.  Recently kidney function looked good.  Follow-up in 1 month or sooner/as needed.   Mackie Pai, PA-C   Time spent with patient today was 54  minutes which consisted of chart revdiew, discussing diagnosis, work up treatment and documentation.

## 2021-07-11 ENCOUNTER — Other Ambulatory Visit: Payer: Self-pay | Admitting: Sports Medicine

## 2021-07-11 ENCOUNTER — Encounter: Payer: Self-pay | Admitting: Medical

## 2021-07-11 DIAGNOSIS — M21371 Foot drop, right foot: Secondary | ICD-10-CM

## 2021-07-11 DIAGNOSIS — M5136 Other intervertebral disc degeneration, lumbar region: Secondary | ICD-10-CM

## 2021-07-11 DIAGNOSIS — M545 Low back pain, unspecified: Secondary | ICD-10-CM

## 2021-07-11 MED ORDER — "BD DISP NEEDLE 25G X 1"" MISC": 0 refills | Status: DC

## 2021-07-11 MED ORDER — "SYRINGE/NEEDLE (DISP) 23G X 1"" 3 ML MISC": 0 refills | Status: DC

## 2021-07-11 MED ORDER — CYANOCOBALAMIN 1000 MCG/ML IJ SOLN: INTRAMUSCULAR | 0 refills | Status: DC

## 2021-07-13 LAB — URINE CULTURE
MICRO NUMBER:: 12799465
SPECIMEN QUALITY:: ADEQUATE

## 2021-07-13 LAB — VITAMIN B1

## 2021-07-14 ENCOUNTER — Other Ambulatory Visit: Payer: No Typology Code available for payment source

## 2021-07-17 ENCOUNTER — Encounter: Payer: Self-pay | Admitting: Sports Medicine

## 2021-07-17 ENCOUNTER — Encounter: Payer: Self-pay | Admitting: Neurology

## 2021-07-17 ENCOUNTER — Ambulatory Visit (INDEPENDENT_AMBULATORY_CARE_PROVIDER_SITE_OTHER): Payer: PRIVATE HEALTH INSURANCE | Admitting: Neurology

## 2021-07-17 VITALS — BP 124/83 | HR 67 | Ht 67.0 in | Wt 184.5 lb

## 2021-07-17 DIAGNOSIS — R4182 Altered mental status, unspecified: Secondary | ICD-10-CM

## 2021-07-17 DIAGNOSIS — M5416 Radiculopathy, lumbar region: Secondary | ICD-10-CM

## 2021-07-17 NOTE — Patient Instructions (Addendum)
Continue current medications  Routine EEG  Recommend physical therapy for lumbar radiculopathy Return if worse

## 2021-07-17 NOTE — Progress Notes (Signed)
[facility]  PATIENT: Charles Trujillo DOB: Nov 21, 1950  REQUESTING CLINICIAN: Saguier, Percell Miller, PA-C HISTORY FROM: Patient and spouse  REASON FOR VISIT: Altered mental status   HISTORICAL  CHIEF COMPLAINT:  Chief Complaint  Patient presents with   New Patient (Initial Visit)    Rm 14. Accompanied by wife Charles Trujillo. NP internal referral for altered mental status, r/o seizure.    HISTORY OF PRESENT ILLNESS:  This is a 71 year old gentleman with past medical history of hypertension, hyperlipidemia, chronic back pain, lumbar radiculopathy who is presenting after 1 episode of confusion on December 21.  Patient reports that on that day he had a gap of about 3 hours where he could not remember.  He was taken to the ED had a Noncon head CT which was negative for any acute stroke.  At that time, he reported that he could not remember a MRI he had 4 days ago. He reports he never had an episode similar in the past and since the 21st he has not had any new episode.  He is back to his baseline, he can remember the MRI he had, but there is still a few hours of the morning of Dec 21 that he cannot recall.   Per wife she reports leaving the patient at home in his usual state of health when she returned she noted that patient was very confused and was not making any sense, no signs of trauma.  She got concerned and took him to the emergency department.  A few days prior, patient was taking tramadol and a muscle relaxant for chronic back pain.    OTHER MEDICAL CONDITIONS: Back pain, lumbar radiculopathy, HTN, HLD   REVIEW OF SYSTEMS: Full 14 system review of systems performed and negative with exception of: as noted in the HPI   ALLERGIES: Allergies  Allergen Reactions   Meth-Hyo-M Bl-Na Phos-Ph Sal Other (See Comments)    [place] believes it may have contributed to renal failure accourding to his spouse who informed us at 17:17 01/19/2021   Codeine Nausea Only    HOME  MEDICATIONS: Outpatient Medications Prior to Visit  Medication Sig Dispense Refill   amoxicillin (AMOXIL) 500 MG capsule Take 4 capsules by mouth one hour prior to any dental procedures 12 capsule 0   atorvastatin (LIPITOR) 40 MG tablet TAKE 1 TABLET BY MOUTH DAILY. 90 tablet 3   blood glucose meter kit and supplies KIT Dispense based on patient and insurance preference. Use up to four times daily as directed. 1 each 0   Cholecalciferol (VITAMIN D3) 50 MCG (2000 UT) capsule Take 2,000 Units by mouth daily.     cyanocobalamin (,VITAMIN B-12,) 1000 MCG/ML injection Inject 53m into muscle once weekly for 4 weeks, then 116minjected into muscle once monthly there after for 5 months. 10 mL 0   diltiazem (CARDIZEM CD) 120 MG 24 hr capsule TAKE 1 CAPSULE BY MOUTH ONCE A DAY 90 capsule 3   glucose blood (ONE TOUCH TEST STRIPS) test strip Check blood sugar twice daily 100 each 11   HYDROcodone-acetaminophen (NORCO) 5-325 MG tablet Take 1 tablet by mouth every 4 (four) hours as needed for moderate pain or severe pain. 20 tablet 0   INV-pembrolizumab [id number] ([id number]) 100 MG/4ML SOLN Inject into the vein.     metoprolol tartrate (LOPRESSOR) 50 MG tablet TAKE 1 TABLET BY MOUTH 2 TIMES DAILY 180 tablet 3   NEEDLE, DISP, 25 G (B-D DISP NEEDLE 25GX1") 25G X 1" MISC To use  w/ B12 injections 50 each 0   nystatin-triamcinolone ointment (MYCOLOG) Apply 1 application topically 2 (two) times daily. 30 g 1   ONETOUCH DELICA LANCETS FINE MISC Use qd 100 each 3   SitaGLIPtin-MetFORMIN HCl (JANUMET XR) 50-500 MG TB24 Take 1 tablet by mouth daily. 30 tablet 2   SYRINGE-NEEDLE, DISP, 3 ML 23G X 1" 3 ML MISC To inject B12 50 each 0   lisinopril (ZESTRIL) 10 MG tablet TAKE 1 TABLET BY MOUTH DAILY * NEEDS OFFICE VISIT (Patient not taking: Reported on 07/17/2021) 90 tablet 1   diltiazem (CARDIZEM CD) 120 MG 24 hr capsule Take 1 capsule (120 mg total) by mouth daily. 90 capsule 1   No facility-administered medications prior to  visit.    PAST MEDICAL HISTORY: Past Medical History:  Diagnosis Date   Anxiety    Arthritis    bilateral knees   Depression    DM2 (diabetes mellitus, type 2) (HCC)    Dysrhythmia     benign tachycardia, takes Cartia   Elevated LFTs    previously evaluated by Dr Sharlett Iles; h/o "fatty liver"   GERD (gastroesophageal reflux disease)    HLD (hyperlipidemia)    Hypertension    Inguinal hernia    right   Squamous cell carcinoma    top of head    PAST SURGICAL HISTORY: Past Surgical History:  Procedure Laterality Date   ACHILLES TENDON REPAIR  1999   BLADDER SURGERY     CARPAL TUNNEL RELEASE Right 11/04/2014   Procedure: LIMITED OPEN RIGHT CARPAL TUNNEL RELEASE;  Surgeon: Roseanne Kaufman, MD;  Location: [facility];  Service: Orthopedics;  Laterality: Right;   CARPAL TUNNEL WITH CUBITAL TUNNEL Left 07/22/2014   Procedure: LEFT CARPAL TUNNEL RELEASE AND CUBITAL TUNNEL RELEASE TRANSPOSITION;  Surgeon: Roseanne Kaufman, MD;  Location: [facility];  Service: Orthopedics;  Laterality: Left;   COLONOSCOPY     EYE SURGERY     laser eye surgery bilat    HERNIA REPAIR  07/2011   left side   INGUINAL HERNIA REPAIR  08/09/2011   Procedure: LAPAROSCOPIC INGUINAL HERNIA;  Surgeon: Joyice Faster. Cornett, MD;  Location: [facility];  Service: General;  Laterality: Right;   KNEE ARTHROSCOPY  1993   left knee   KNEE SURGERY     age 20- right   SQUAMOUS CELL CARCINOMA EXCISION     left knee   TOTAL KNEE ARTHROPLASTY Left 05/04/2019   Procedure: LEFT TOTAL KNEE ARTHROPLASTY;  Surgeon: Garald Balding, MD;  Location: [facility];  Service: Orthopedics;  Laterality: Left;   TOTAL KNEE ARTHROPLASTY Right 05/09/2020   Procedure: RIGHT TOTAL KNEE ARTHROPLASTY;  Surgeon: Garald Balding, MD;  Location: [facility];  Service: Orthopedics;  Laterality: Right;    FAMILY HISTORY: Family History  Problem Relation Age of Onset   Diabetes Mother    Hypertension Mother    Heart disease  Mother    Heart failure Mother    Stroke Father 43   Parkinsonism Father    Depression Brother    Alcohol abuse Brother    Cancer Maternal Grandmother        bone   Colon cancer Neg Hx    Colon polyps Neg Hx    Esophageal cancer Neg Hx    Rectal cancer Neg Hx    Stomach cancer Neg Hx     SOCIAL HISTORY: Social History   Socioeconomic History   Marital status: Married    Spouse name: Not on  file   Number of children: 1   Years of education: Not on file   Highest education level: Not on file  Occupational History   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    Employer: [employer]  Tobacco Use   Smoking status: Some Days    Types: Cigars   Smokeless tobacco: Never   Tobacco comments:    smokes one every other day  Vaping Use   Vaping Use: Never used  Substance and Sexual Activity   Alcohol use: Yes    Alcohol/week: 3.0 - 4.0 standard drinks    Types: 3 - 4 Cans of beer per week    Comment: social   Drug use: No   Sexual activity: Yes    Partners: Female  Other Topics Concern   Not on file  Social History Narrative   Married to [place]   exercise--- not regularly   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 Wt Readings from Last 3 Encounters:  07/17/21 184 lb 8 oz (83.7 kg)  07/10/21 188 lb 12.8 oz (85.6 kg)  07/04/21 194 lb (88 kg)   Patient is in no distress; well developed, nourished and groomed;  neck is supple  CARDIOVASCULAR: Examination of carotid arteries is normal; no carotid bruits Regular rate and rhythm, no murmurs Examination of peripheral vascular system by observation and palpation is normal  EYES: Pupils round and reactive to light, Visual fields full to confrontation, Extraocular movements intacts,   MUSCULOSKELETAL: Gait, strength, tone, movements noted in Neurologic exam below  NEUROLOGIC: MENTAL STATUS:  MMSE - Mini Mental State Exam 07/10/2021  Orientation to time 5  Orientation to Place 5  Registration 3  Attention/ Calculation 5  Recall 3  Language- name 2 objects 2  Language- repeat 1  Language- follow 3 step command 3  Language- read & follow direction 1  Write a sentence 1  Copy design 1  Total score 30   awake, alert, oriented to person, place and time recent and remote memory intact normal attention and concentration language fluent, comprehension intact, naming intact fund of knowledge appropriate  CRANIAL NERVE:  2nd, 3rd, 4th, 6th - pupils equal and reactive to light, visual fields full to confrontation, extraocular muscles intact, no nystagmus 5th - facial sensation symmetric 7th - facial strength symmetric 8th - hearing intact 9th - palate elevates symmetrically, uvula midline 11th - shoulder shrug symmetric 12th - tongue protrusion midline  MOTOR:  normal bulk and tone, full strength in the BUE, BLE  SENSORY:  normal and symmetric to light touch  COORDINATION:  finger-nose-finger, fine finger movements normal  REFLEXES:  deep tendon reflexes present and symmetric  GAIT/STATION:  Antalgic gait, walks with a cane      DIAGNOSTIC DATA (LABS, IMAGING, TESTING) - I reviewed patient records, labs, notes, testing and imaging myself where available.  Lab Results  Component Value Date   WBC 10.8 (H) 07/04/2021   HGB 16.2 07/04/2021   HCT 49.0 07/04/2021   MCV 88.8 07/04/2021   PLT 246 07/04/2021      Component  Value Date/Time   NA 136 07/04/2021 1613   K 3.8 07/04/2021 1613   CL 106 07/04/2021 1613   CO2 24 07/04/2021 1613   GLUCOSE 124 (H) 07/04/2021 1613   BUN 12 07/04/2021 1613   CREATININE 0.94 07/04/2021 1613   CREATININE 1.10 03/14/2020 0934   CALCIUM 8.5 (L) 07/04/2021 1613   PROT 7.3 07/04/2021 1613   ALBUMIN 4.0 07/04/2021 1613   AST 37 07/04/2021 1613   ALT 42 07/04/2021 1613   ALKPHOS 58 07/04/2021 1613   BILITOT 0.6 07/04/2021 1613   GFRNONAA >60 07/04/2021 1613   GFRNONAA 59 (L) 03/28/2016 0806   GFRAA 58 (L) 05/05/2019 0254   GFRAA 68 03/28/2016 0806   Lab Results  Component Value Date   CHOL 125 05/29/2021   HDL 48.00 05/29/2021   LDLCALC 60 05/29/2021   LDLDIRECT 143.5 02/01/2011   TRIG 85.0 05/29/2021   CHOLHDL 3 05/29/2021   Lab Results  Component Value Date   HGBA1C 6.8 (H) 05/29/2021   Lab Results  Component Value Date   VITAMINB12 260 07/10/2021   Lab Results  Component Value Date   TSH 1.94 05/29/2021    Head CT 12/21 1. No acute intracranial findings. 2. Mild atrophy and white matter microvascular disease.     ASSESSMENT AND PLAN  71 y.o. year old male with history of hypertension, hyperlipidemia, chronic back pain, lumbar radiculopathy who is presenting after 1 episode of confusion and amnesia.  Patient is now back to his baseline, but still report there is impaired at time that he cannot remember on December 21.  This was in the setting of taking muscle relaxant and tramadol for chronic back pain.  I explained to the patient that his transient amnesia might be secondary to the medication side effect but it is reasonable to obtain a routine EEG to rule out seizures.  He denies any previous history of stroke and his head CT that he had on December 21 does not show evidence of a previous stroke.  I will contact the patient to go over the MRI result otherwise he will need to follow-up with his PCP.  For his lumbar radiculopathy also recommended  physical therapy.  Patient reported he is following up with orthopedic and there is a plan to have a injection for the pain.  Return if worse.   1. Altered mental status, unspecified altered mental status type   2. Lumbar radiculopathy      Patient Instructions  Continue current medications  Routine EEG  Recommend physical therapy for lumbar radiculopathy Return if worse   Orders Placed This Encounter  Procedures   EEG adult    No orders of the defined types were placed in this encounter.   Return if symptoms worsen or fail to improve.    Alric Ran, MD 07/17/2021, 4:31 PM  [facility] [address] [place], [place] [address] [phone]

## 2021-07-19 ENCOUNTER — Ambulatory Visit: Payer: No Typology Code available for payment source | Admitting: Family Medicine

## 2021-07-19 ENCOUNTER — Encounter: Payer: Self-pay | Admitting: Sports Medicine

## 2021-07-19 ENCOUNTER — Ambulatory Visit
Admission: RE | Admit: 2021-07-19 | Discharge: 2021-07-19 | Disposition: A | Discharge status: Home / Self Care | Payer: Self-pay | Source: Ambulatory Visit | Attending: Sports Medicine | Admitting: Sports Medicine

## 2021-07-19 ENCOUNTER — Other Ambulatory Visit: Payer: Self-pay

## 2021-07-19 DIAGNOSIS — M545 Low back pain, unspecified: Secondary | ICD-10-CM

## 2021-07-19 MED ORDER — METHYLPREDNISOLONE ACETATE 40 MG/ML INJ SUSP (RADIOLOG
80.0000 mg | Freq: Once | INTRAMUSCULAR | Status: AC
  Administered 2021-07-19: 80 mg via EPIDURAL

## 2021-07-19 MED ORDER — IOPAMIDOL (ISOVUE-M 200) INJECTION 41%
1.0000 mL | Freq: Once | INTRAMUSCULAR | Status: AC
  Administered 2021-07-19: 1 mL via EPIDURAL

## 2021-07-19 NOTE — Discharge Instructions (Signed)

## 2021-07-23 ENCOUNTER — Other Ambulatory Visit: Payer: Self-pay

## 2021-07-23 ENCOUNTER — Ambulatory Visit (INDEPENDENT_AMBULATORY_CARE_PROVIDER_SITE_OTHER): Payer: PRIVATE HEALTH INSURANCE | Admitting: Neurology

## 2021-07-23 DIAGNOSIS — R410 Disorientation, unspecified: Secondary | ICD-10-CM

## 2021-07-23 DIAGNOSIS — R4182 Altered mental status, unspecified: Secondary | ICD-10-CM

## 2021-07-23 NOTE — Procedures (Signed)
° ° °  History:  61 yom with altered mental status   EEG classification: Awake and drowsy  Description of the recording: The background rhythms of this recording consists of a fairly well modulated medium amplitude alpha rhythm of 7-8 Hz that is reactive to eye opening and closure. As the record progresses, the patient appears to remain in the waking state throughout the recording. Photic stimulation was performed, did not show any abnormalities. Hyperventilation was also performed, did not show any abnormalities. Toward the end of the recording, the patient enters the drowsy state with slight symmetric slowing seen. The patient never enters stage II sleep. No abnormal epileptiform discharges seen during this recording. There was no focal slowing. EKG monitor shows no evidence of cardiac rhythm abnormalities with a heart rate of 72.  Impression: This is an essentially normal EEG recording in the waking and drowsy state. No evidence of interictal epileptiform discharges seen. A normal EEG does not exclude a diagnosis of epilepsy.    Alric Ran, MD [facility]

## 2021-07-25 ENCOUNTER — Encounter: Payer: Self-pay | Admitting: Family Medicine

## 2021-07-30 ENCOUNTER — Encounter: Payer: Self-pay | Admitting: Family Medicine

## 2021-08-06 ENCOUNTER — Encounter: Payer: Self-pay | Admitting: Family Medicine

## 2021-08-10 ENCOUNTER — Encounter: Payer: Self-pay | Admitting: Family Medicine

## 2021-08-10 ENCOUNTER — Ambulatory Visit: Payer: PRIVATE HEALTH INSURANCE | Admitting: Family Medicine

## 2021-08-10 VITALS — BP 130/80 | HR 84 | Temp 98.6°F | Resp 18 | Ht 67.0 in | Wt 186.2 lb

## 2021-08-10 DIAGNOSIS — Z8659 Personal history of other mental and behavioral disorders: Secondary | ICD-10-CM

## 2021-08-10 DIAGNOSIS — I10 Essential (primary) hypertension: Secondary | ICD-10-CM

## 2021-08-10 NOTE — Patient Instructions (Signed)

## 2021-08-10 NOTE — Assessment & Plan Note (Signed)
Probable etiology was taking tramadol and muscle relaxer together

## 2021-08-10 NOTE — Assessment & Plan Note (Signed)
Well controlled, no changes to meds. Encouraged heart healthy diet such as the DASH diet and exercise as tolerated.  Repeat bp came down

## 2021-08-10 NOTE — Progress Notes (Addendum)
Subjective:   By signing my name below, I, [name], attest that this documentation has been prepared under the direction and in the presence of [name], DO. 08/10/2021    Patient ID: Charles Trujillo, male    DOB: 1950-12-11, 71 y.o.   MRN: [id number]  Chief Complaint  Patient presents with   Memory Loss   Follow-up    HPI Patient is in today for an office visit and 1 month f/u on memory loss.  He was taking tramadol and a muscle relaxer together which might have caused a temporary memory loss. He has stopped taking tramadol. EEG done was non-impressive. He reports he is doing well now and had had no other episodes of memory loss.  He is still experiencing back pain and had received an injection which only worked for two days. He was referred to [facility] and will be seeing them soon. He might also start physical therapy soon.   He is doing well with his infusions and reports that the lymph node is shrinking.  His blood pressure is elevated at this visit. He is not currently taking 10 mg lisinopril. BP Readings from Last 3 Encounters:  08/10/21 130/80  07/19/21 (!) 165/89  07/17/21 124/83    Recheck- 130/80  Past Medical History:  Diagnosis Date   Anxiety    Arthritis    bilateral knees   Depression    DM2 (diabetes mellitus, type 2) (HCC)    Dysrhythmia     benign tachycardia, takes Cartia   Elevated LFTs    previously evaluated by Dr Sharlett Iles; h/o "fatty liver"   GERD (gastroesophageal reflux disease)    HLD (hyperlipidemia)    Hypertension    Inguinal hernia    right   Squamous cell carcinoma    top of head    Past Surgical History:  Procedure Laterality Date   ACHILLES TENDON REPAIR  1999   BLADDER SURGERY     CARPAL TUNNEL RELEASE Right 11/04/2014   Procedure: LIMITED OPEN RIGHT CARPAL TUNNEL RELEASE;  Surgeon: Roseanne Kaufman, MD;  Location: [place];  Service: Orthopedics;  Laterality: Right;   CARPAL TUNNEL WITH  CUBITAL TUNNEL Left 07/22/2014   Procedure: LEFT CARPAL TUNNEL RELEASE AND CUBITAL TUNNEL RELEASE TRANSPOSITION;  Surgeon: Roseanne Kaufman, MD;  Location: [place];  Service: Orthopedics;  Laterality: Left;   COLONOSCOPY     EYE SURGERY     laser eye surgery bilat    HERNIA REPAIR  07/2011   left side   INGUINAL HERNIA REPAIR  08/09/2011   Procedure: LAPAROSCOPIC INGUINAL HERNIA;  Surgeon: Joyice Faster. Cornett, MD;  Location: [facility];  Service: General;  Laterality: Right;   KNEE ARTHROSCOPY  1993   left knee   KNEE SURGERY     age 75- right   SQUAMOUS CELL CARCINOMA EXCISION     left knee   TOTAL KNEE ARTHROPLASTY Left 05/04/2019   Procedure: LEFT TOTAL KNEE ARTHROPLASTY;  Surgeon: Garald Balding, MD;  [facility];  Service: Orthopedics;  Laterality: Left;   TOTAL KNEE ARTHROPLASTY Right 05/09/2020   Procedure: RIGHT TOTAL KNEE ARTHROPLASTY;  Surgeon: Garald Balding, MD;  [facility];  Service: Orthopedics;  Laterality: Right;    Family History  Problem Relation Age of Onset   Diabetes Mother    Hypertension Mother    Heart disease Mother    Heart failure Mother    Stroke Father 61  Parkinsonism Father    Depression Brother    Alcohol abuse Brother    Cancer Maternal Grandmother        bone   Colon cancer Neg Hx    Colon polyps Neg Hx    Esophageal cancer Neg Hx    Rectal cancer Neg Hx    Stomach cancer Neg Hx     Social History   Socioeconomic History   Marital status: Married    Spouse name: Not on file   Number of children: 1   Years of education: Not on file   Highest education level: Not on file  Occupational History   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, fast food: [place]  Tobacco Use   Smoking status: Some Days    Types: Cigars   Smokeless tobacco: Never   Tobacco comments:    smokes one every other day  Vaping Use   Vaping Use: Never used  Substance and Sexual Activity    Alcohol use: Yes    Alcohol/week: 3.0 - 4.0 standard drinks    Types: 3 - 4 Cans of beer per week    Comment: social   Drug use: No   Sexual activity: Yes    Partners: Female  Other Topics Concern   Not on file  Social History Narrative   Married to [place]   exercise--- not regularly   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 90 tablet 3   blood glucose meter kit and supplies KIT Dispense based on patient and insurance preference. Use up to four times daily as directed. 1 each 0   Cholecalciferol (VITAMIN D3) 50 MCG (2000 UT) capsule Take 2,000 Units by mouth daily.     cyanocobalamin (,VITAMIN B-12,) 1000 MCG/ML injection Inject 11m into muscle once weekly for 4 weeks, then 172minjected into muscle once monthly there after for 5 months. 10 mL 0   diltiazem (CARDIZEM CD) 120 MG 24 hr capsule TAKE 1 CAPSULE BY MOUTH ONCE A DAY 90 capsule 3   glucose blood (ONE TOUCH TEST STRIPS) test strip Check blood sugar twice daily 100 each 11   HYDROcodone-acetaminophen (NORCO) 5-325 MG tablet Take 1 tablet by mouth every 4 (four) hours as needed for moderate pain or severe pain. 20 tablet 0   INV-pembrolizumab EA3191 (MK-3475) 100 MG/4ML SOLN Inject into the vein.     metoprolol tartrate (LOPRESSOR) 50 MG tablet TAKE 1 TABLET BY MOUTH 2 TIMES DAILY 180 tablet 3   NEEDLE, DISP, 25 G (B-D DISP NEEDLE 25GX1") 25G X 1"  MISC To use w/ B12 injections 50 each 0   nystatin-triamcinolone ointment (MYCOLOG) Apply 1 application topically 2 (two) times daily. 30 g 1   ONETOUCH DELICA LANCETS FINE MISC Use qd 100 each 3   SitaGLIPtin-MetFORMIN HCl (JANUMET XR) 50-500 MG TB24  Take 1 tablet by mouth daily. 30 tablet 2   SYRINGE-NEEDLE, DISP, 3 ML 23G X 1" 3 ML MISC To inject B12 50 each 0   lisinopril (ZESTRIL) 10 MG tablet TAKE 1 TABLET BY MOUTH DAILY * NEEDS OFFICE VISIT (Patient not taking: Reported on 07/17/2021) 90 tablet 1   No facility-administered medications prior to visit.    Allergies  Allergen Reactions   Meth-Hyo-M Bl-Na Phos-Ph Sal Other (See Comments)    [place] believes it may have contributed to renal failure accourding to his spouse who informed us at 17:17 01/19/2021   Codeine Nausea Only    Review of Systems  Constitutional:  Negative for fever.  HENT:  Negative for congestion, ear pain, hearing loss, sinus pain and sore throat.   Eyes:  Negative for blurred vision and pain.  Respiratory:  Negative for cough, sputum production, shortness of breath and wheezing.   Cardiovascular:  Negative for chest pain and palpitations.  Gastrointestinal:  Negative for blood in stool, constipation, diarrhea, nausea and vomiting.  Genitourinary:  Negative for dysuria, frequency, hematuria and urgency.  Musculoskeletal:  Positive for back pain. Negative for falls and myalgias.  Neurological:  Negative for dizziness, sensory change, loss of consciousness, weakness and headaches.  Endo/Heme/Allergies:  Negative for environmental allergies. Does not bruise/bleed easily.  Psychiatric/Behavioral:  Negative for depression and suicidal ideas. The patient is not nervous/anxious and does not have insomnia.       Objective:    Physical Exam Constitutional:      General: He is not in acute distress.    Appearance: Normal appearance. He is not ill-appearing.  HENT:     Head: Normocephalic and atraumatic.     Right  Ear: Tympanic membrane, ear canal and external ear normal.     Left Ear: Tympanic membrane, ear canal and external ear normal.  Eyes:     Pupils: Pupils are equal, round, and reactive to light.  Cardiovascular:     Rate and Rhythm: Normal rate and regular rhythm.     Pulses: Normal pulses.     Heart sounds: No murmur heard.   No gallop.  Pulmonary:     Effort: Pulmonary effort is normal. No respiratory distress.     Breath sounds: Normal breath sounds. No wheezing or rhonchi.  Abdominal:     General: Bowel sounds are normal. There is no distension.     Palpations: Abdomen is soft.     Tenderness: There is no abdominal tenderness. There is no guarding.     Hernia: No hernia is present.  Musculoskeletal:     Cervical back: Neck supple.  Lymphadenopathy:     Cervical: No cervical adenopathy.  Skin:    General: Skin is warm and dry.  Neurological:     Mental Status: He is alert and oriented to person, place, and time.    BP 130/80    Pulse 84    Temp 98.6 F (37 C) (Oral)    Resp 18    Ht 5' 7" (1.702 m)    Wt 186 lb 3.2 oz (84.5 kg)    SpO2 97%    BMI 29.16 kg/m  Wt Readings from Last 3 Encounters:  08/10/21 186 lb 3.2 oz (84.5 kg)  07/17/21 184 lb 8 oz (83.7 kg)  07/10/21 188 lb 12.8 oz (85.6 kg)    Diabetic Foot Exam - Simple   No data filed    Lab Results  Component Value Date   WBC 10.8 (H) 07/04/2021  HGB 16.2 07/04/2021   HCT 49.0 07/04/2021   PLT 246 07/04/2021   GLUCOSE 124 (H) 07/04/2021   CHOL 125 05/29/2021   TRIG 85.0 05/29/2021   HDL 48.00 05/29/2021   LDLDIRECT 143.5 02/01/2011   LDLCALC 60 05/29/2021   ALT 42 07/04/2021   AST 37 07/04/2021   NA 136 07/04/2021   K 3.8 07/04/2021   CL 106 07/04/2021   CREATININE 0.94 07/04/2021   BUN 12 07/04/2021   CO2 24 07/04/2021   TSH 1.94 05/29/2021   PSA 0.19 07/10/2021   INR 1.2 05/01/2020   HGBA1C 6.8 (H) 05/29/2021   MICROALBUR 29.4 (H) 08/31/2020    Lab Results  Component Value Date   TSH 1.94  05/29/2021   Lab Results  Component Value Date   WBC 10.8 (H) 07/04/2021   HGB 16.2 07/04/2021   HCT 49.0 07/04/2021   MCV 88.8 07/04/2021   PLT 246 07/04/2021   Lab Results  Component Value Date   NA 136 07/04/2021   K 3.8 07/04/2021   CO2 24 07/04/2021   GLUCOSE 124 (H) 07/04/2021   BUN 12 07/04/2021   CREATININE 0.94 07/04/2021   BILITOT 0.6 07/04/2021   ALKPHOS 58 07/04/2021   AST 37 07/04/2021   ALT 42 07/04/2021   PROT 7.3 07/04/2021   ALBUMIN 4.0 07/04/2021   CALCIUM 8.5 (L) 07/04/2021   ANIONGAP 6 07/04/2021   GFR 69.62 05/29/2021   Lab Results  Component Value Date   CHOL 125 05/29/2021   Lab Results  Component Value Date   HDL 48.00 05/29/2021   Lab Results  Component Value Date   LDLCALC 60 05/29/2021   Lab Results  Component Value Date   TRIG 85.0 05/29/2021   Lab Results  Component Value Date   CHOLHDL 3 05/29/2021   Lab Results  Component Value Date   HGBA1C 6.8 (H) 05/29/2021       Assessment & Plan:   Problem List Items Addressed This Visit       Unprioritized   Essential hypertension    Well controlled, no changes to meds. Encouraged heart healthy diet such as the DASH diet and exercise as tolerated.  Repeat bp came down       Mental status change resolved    Probable etiology was taking tramadol and muscle relaxer together        Other Visit Diagnoses     Primary hypertension    -  Primary        No orders of the defined types were placed in this encounter.   I,[name],acting as a Education administrator for [employer], DO.,have documented all relevant documentation on the behalf of [name], DO,as directed by  [name], DO while in the presence of [name], [place], DO., personally preformed the services described in this documentation.  All medical record entries made by the scribe were at my direction and in my presence.  I have reviewed the chart and  discharge instructions (if applicable) and agree that the record reflects my personal performance and is accurate and complete. 08/10/2021

## 2021-08-21 ENCOUNTER — Encounter: Payer: Self-pay | Admitting: Family Medicine

## 2021-08-21 ENCOUNTER — Telehealth: Payer: Self-pay

## 2021-08-21 NOTE — Telephone Encounter (Signed)
PA initiated via Covermymeds; KEY: [id number]. Awaiting determination.

## 2021-08-21 NOTE — Telephone Encounter (Signed)
PA approved.   Request Reference Number: [id number]. JANUMET XR TAB 50-500MG  is approved through 08/21/2022. Your patient may now fill this prescription and it will be covered.

## 2021-08-21 NOTE — Telephone Encounter (Signed)
Noted  

## 2021-08-28 ENCOUNTER — Ambulatory Visit: Payer: PRIVATE HEALTH INSURANCE | Admitting: Family Medicine

## 2021-08-31 NOTE — Progress Notes (Signed)
[name] Phone: [phone] Subjective:   [name], am serving as a scribe for Dr. Hulan Saas.  This visit occurred during the SARS-CoV-2 public health emergency.  Safety protocols were in place, including screening questions prior to the visit, additional usage of staff PPE, and extensive cleaning of exam room while observing appropriate contact time as indicated for disinfecting solutions.    I'm seeing this patient by the request  of:  [name], DO  CC: Foot drop follow-up  [id number]  06/25/2021 1. Right hip pain 2. History of bladder cancer 3. Right foot drop -Acute, uncertain prognosis, subsequent sports medicine visit - Unclear etiology of right foot drop and severe right hip pain. 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 Rule out Pelvic metastasis  - Continue Tylenol for daily pain relief and tramadol for breakthrough pain - Start previously prescribed Robaxin nightly for pain relief.  Advised patient to be cautious as muscle relaxer and tramadol may make patient drowsy.  Advised to have walker or cane next to bedside - Toradol 60 mg-Depo-Medrol 80 mg IM injection provided in clinic today for acute pain relief, oncology office note 06/06/2021   Pertinent previous records reviewed include x-ray pelvis 06/19/2021, x-ray lumbar spine 06/19/2021   Follow Up: After MRI to discuss results  Epi on 07/19/2021  Updated 09/04/2021 Charles Trujillo is a 71 y.o.  male coming in with complaint of R hip pain. Patient states that his hip is improving with time and physical therapy. Using 1/2 Tramdol which also helps.  Feels like the tramadol helps more than the OxyContin he was given to previously.  Feels like he is making progress but continues to have some discomfort and pain.      Past Medical History:  Diagnosis Date   Anxiety    Arthritis    bilateral knees   Depression    DM2 (diabetes mellitus, type 2) (HCC)    Dysrhythmia     benign tachycardia, takes Cartia   Elevated LFTs    previously evaluated by Dr Sharlett Iles; h/o "fatty liver"   GERD (gastroesophageal reflux disease)    HLD (hyperlipidemia)    Hypertension    Inguinal hernia    right   Squamous cell carcinoma    top of head   Past Surgical History:  Procedure Laterality Date   ACHILLES TENDON REPAIR  1999   BLADDER SURGERY     CARPAL TUNNEL RELEASE Right 11/04/2014   Procedure: LIMITED OPEN RIGHT CARPAL TUNNEL RELEASE;  Surgeon: Roseanne Kaufman, MD;  Location: [place];  Service: Orthopedics;  Laterality: Right;   CARPAL TUNNEL WITH CUBITAL TUNNEL Left 07/22/2014   Procedure: LEFT CARPAL TUNNEL RELEASE AND CUBITAL TUNNEL RELEASE TRANSPOSITION;  Surgeon: Roseanne Kaufman, MD;  Location: [facility];  Service: Orthopedics;  Laterality: Left;   COLONOSCOPY     EYE SURGERY     laser eye surgery bilat    HERNIA REPAIR  07/2011   left side   INGUINAL HERNIA REPAIR  08/09/2011   Procedure: LAPAROSCOPIC INGUINAL HERNIA;  Surgeon: Joyice Faster. Cornett, MD;  [facility];  Service: General;  Laterality: Right;   KNEE ARTHROSCOPY  1993   left knee   KNEE SURGERY     age 29- right   SQUAMOUS CELL CARCINOMA EXCISION     left knee   TOTAL KNEE ARTHROPLASTY Left 05/04/2019   Procedure: LEFT TOTAL KNEE ARTHROPLASTY;  Surgeon: Garald Balding, MD;  [facility];  Service: Orthopedics;  Laterality: Left;   TOTAL KNEE ARTHROPLASTY Right 05/09/2020    Procedure: RIGHT TOTAL KNEE ARTHROPLASTY;  Surgeon: Garald Balding, MD;  [facility];  Service: Orthopedics;  Laterality: Right;   Social History   Socioeconomic History   Marital status: Married    Spouse name: Not on file   Number of children: 1   Years of education: Not on file   Highest education level: Not on file  Occupational History   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, fast food: [employer]  Tobacco Use   Smoking status: Some Days    Types: Cigars   Smokeless tobacco: Never   Tobacco comments:    smokes one every other day  Vaping Use   Vaping Use: Never used  Substance and Sexual Activity   Alcohol use: Yes    Alcohol/week: 3.0 - 4.0 standard drinks    Types: 3 - 4 Cans of beer per week    Comment: social   Drug use: No   Sexual activity: Yes    Partners: Female  Other Topics Concern   Not on file  Social History Narrative   Married to [place]   exercise--- not regularly   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   Occasional smoker   Social Determinants of Radio broadcast assistant Strain: Not on file  Food Insecurity: Not on file  Transportation Needs: Not on file  Physical Activity: Not on file  Stress: Not on file  Social Connections: Not on file   Allergies  Allergen Reactions   Meth-Hyo-M Bl-Na Phos-Ph Sal Other (See Comments)    [place] believes it may have contributed to renal failure accourding to his spouse who informed us at 17:17 01/19/2021   Codeine Nausea Only   Family History  Problem Relation Age of Onset   Diabetes Mother    Hypertension Mother    Heart disease Mother    Heart failure Mother    Stroke Father 110   Parkinsonism Father    Depression Brother    Alcohol abuse Brother    Cancer Maternal Grandmother        bone   Colon cancer Neg Hx    Colon polyps Neg Hx    Esophageal cancer Neg Hx    Rectal cancer Neg Hx    Stomach  cancer Neg Hx     Current Outpatient Medications (Endocrine & Metabolic):    SitaGLIPtin-MetFORMIN HCl (JANUMET XR) 50-500 MG TB24, Take 1 tablet by mouth daily.  Current Outpatient Medications (Cardiovascular):    diltiazem (CARDIZEM CD) 120 MG 24 hr capsule, TAKE 1 CAPSULE BY MOUTH ONCE A DAY   atorvastatin (LIPITOR) 40 MG tablet, TAKE 1 TABLET BY MOUTH DAILY.   lisinopril (ZESTRIL) 10 MG tablet, TAKE 1 TABLET BY MOUTH DAILY * NEEDS OFFICE VISIT (Patient not taking: Reported on 07/17/2021)   metoprolol tartrate (LOPRESSOR) 50 MG tablet, TAKE 1 TABLET BY  MOUTH 2 TIMES DAILY   Current Outpatient Medications (Analgesics):    HYDROcodone-acetaminophen (NORCO) 5-325 MG tablet, Take 1 tablet by mouth every 4 (four) hours as needed for moderate pain or severe pain.   traMADol (ULTRAM) 50 MG tablet, Take 1 tablet (50 mg total) by mouth every 6 (six) hours as needed.  Current Outpatient Medications (Hematological):    cyanocobalamin (,VITAMIN B-12,) 1000 MCG/ML injection, Inject 46m into muscle once weekly for 4 weeks, then 184minjected into muscle once monthly there after for 5 months.  Current Outpatient Medications (Other):    amoxicillin (AMOXIL) 500 MG capsule, Take 4 capsules by mouth one hour prior to any dental procedures   blood glucose meter kit and supplies KIT, Dispense based on patient and insurance preference. Use up to four times daily as directed.   Cholecalciferol (VITAMIN D3) 50 MCG (2000 UT) capsule, Take 2,000 Units by mouth daily.   glucose blood (ONE TOUCH TEST STRIPS) test strip, Check blood sugar twice daily   INV-pembrolizumab EA3191 (MK-3475) 100 MG/4ML SOLN, Inject into the vein.   NEEDLE, DISP, 25 G (B-D DISP NEEDLE 25GX1") 25G X 1" MISC, To use w/ B12 injections   nystatin-triamcinolone ointment (MYCOLOG), Apply 1 application topically 2 (two) times daily.   ONETOUCH DELICA LANCETS FINE MISC, Use qd   SYRINGE-NEEDLE, DISP, 3 ML 23G X 1" 3 ML MISC, To inject  B12   Reviewed prior external information including notes and imaging from  primary care provider As well as notes that were available from care everywhere and other healthcare systems.  Past medical history, social, surgical and family history all reviewed in electronic medical record.  No pertanent information unless stated regarding to the chief complaint.   Review of Systems:  No headache, visual changes, nausea, vomiting, diarrhea, constipation, dizziness, abdominal pain, skin rash, fevers, chills, night sweats, weight loss, swollen lymph nodes, joint swelling, chest pain, shortness of breath, mood changes. POSITIVE muscle aches, body aches  Objective  Blood pressure (!) 142/80, pulse (!) 107, height '5\' 7"'  (1.702 m), weight 188 lb (85.3 kg), SpO2 98 %.   General: No apparent distress alert and oriented x3 mood and affect normal, dressed appropriately.  HEENT: Pupils equal, extraocular movements intact  Respiratory: Patient's speak in full sentences and does not appear short of breath  Cardiovascular: No lower extremity edema, non tender, no erythema  Gait mild antalgic gait Patient still has some weakness with dorsiflexion of foot with 3+ out of 5 strength. Patient's hip exam still has some mild decrease in internal and external range of motion but good flexion noted.  Mild tenderness to palpation in the greater trochanteric area in the posterior aspect of the sacrum and the spine.    Impression and Recommendations:    The above documentation has been reviewed and is accurate and complete ZaLyndal PulleyDO

## 2021-09-04 ENCOUNTER — Encounter: Payer: Self-pay | Admitting: Family Medicine

## 2021-09-04 ENCOUNTER — Ambulatory Visit (INDEPENDENT_AMBULATORY_CARE_PROVIDER_SITE_OTHER): Payer: PRIVATE HEALTH INSURANCE | Admitting: Family Medicine

## 2021-09-04 ENCOUNTER — Other Ambulatory Visit: Payer: Self-pay

## 2021-09-04 VITALS — BP 142/80 | HR 107 | Ht 67.0 in | Wt 188.0 lb

## 2021-09-04 DIAGNOSIS — M545 Low back pain, unspecified: Secondary | ICD-10-CM | POA: Diagnosis not present

## 2021-09-04 MED ORDER — TRAMADOL HCL 50 MG PO TABS
50.0000 mg | ORAL_TABLET | Freq: Four times a day (QID) | ORAL | 0 refills | Status: DC | PRN
Start: 2021-09-04 — End: 2021-10-02

## 2021-09-04 NOTE — Patient Instructions (Addendum)
MRI lumbar [id number] 1/2 Tramadol w tylenol Do not take oxycotin  See me in 4 weeks

## 2021-09-04 NOTE — Assessment & Plan Note (Signed)
Patient still unfortunately does have weakness with 3+ out of 5 strength with dorsiflexion of the foot.  Still concerned with this.  X-rays have shown an L4-L5 degenerative disc disease that I do think the nerve root impingement could be corresponding to this.  In addition with patient's past medical history for cancer I do think we do need an MRI and get it more urgently.  We will order this of the MRI of the lumbar spine to further evaluate.  Depending on findings this can change medical management.  For now patient will continue with the conservative therapy.  Follow-up again with me in 4 weeks otherwise.  Tramadol given for patient to try for any pain relief and discontinue the OxyContin that was given to him.

## 2021-09-25 ENCOUNTER — Ambulatory Visit
Admission: RE | Admit: 2021-09-25 | Discharge: 2021-09-25 | Disposition: A | Discharge status: Home / Self Care | Payer: PRIVATE HEALTH INSURANCE | Source: Ambulatory Visit | Attending: Family Medicine | Admitting: Family Medicine

## 2021-09-25 ENCOUNTER — Other Ambulatory Visit: Payer: Self-pay

## 2021-09-25 DIAGNOSIS — M545 Low back pain, unspecified: Secondary | ICD-10-CM

## 2021-09-30 ENCOUNTER — Other Ambulatory Visit: Payer: Self-pay | Admitting: Family Medicine

## 2021-09-30 ENCOUNTER — Encounter: Payer: Self-pay | Admitting: Family Medicine

## 2021-09-30 DIAGNOSIS — M5136 Other intervertebral disc degeneration, lumbar region: Secondary | ICD-10-CM

## 2021-09-30 DIAGNOSIS — M545 Low back pain, unspecified: Secondary | ICD-10-CM

## 2021-10-03 NOTE — Progress Notes (Signed)
?Charlann Boxer D.O. ?[facility] ?[place] ?Phone: [phone] ?Subjective:   ?I, Vilma Meckel, am serving as a Education administrator for Dr. Hulan Saas. ?This visit occurred during the SARS-CoV-2 public health emergency.  Safety protocols were in place, including screening questions prior to the visit, additional usage of staff PPE, and extensive cleaning of exam room while observing appropriate contact time as indicated for disinfecting solutions.  ? ?I'm seeing this patient by the request  of:  [name], DO ? ?CC: Low back pain ? ?[id number]  ?09/04/2021 ?Patient still unfortunately does have weakness with 3+ out of 5 strength with dorsiflexion of the foot.  Still concerned with this.  X-rays have shown an L4-L5 degenerative disc disease that I do think the nerve root impingement could be corresponding to this.  In addition with patient's past medical history for cancer I do think we do need an MRI and get it more urgently.  We will order this of the MRI of the lumbar spine to further evaluate.  Depending on findings this can change medical management.  For now patient will continue with the conservative therapy.  Follow-up again with me in 4 weeks otherwise.  Tramadol given for patient to try for any pain relief and discontinue the OxyContin that was given to him. ? ?Updated 10/04/2021 ?Charles Trujillo is a 71 y.o. male coming in with complaint of back pain. Feels like his back pain has gotten a little better. No other complaints. ? ?MRI IMPRESSION: ?1. 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 ?2. Multilevel neural foraminal stenosis, severe at L4-5. ?Patient was ordered an epidural but has not been scheduled yet. ?Medications include tramadol and patient does have other pain medications including hydrocodone from another provider. ?  ? ?Past Medical History:   ?Diagnosis Date  ? Anxiety   ? Arthritis   ? bilateral knees  ? Depression   ? DM2 (diabetes mellitus, type 2) ([place])   ? Dysrhythmia   ?  benign tachycardia, takes [place]  ? Elevated LFTs   ? previously evaluated by Dr Sharlett Iles; h/o "fatty liver"  ? GERD (gastroesophageal reflux disease)   ? HLD (hyperlipidemia)   ? Hypertension   ? Inguinal hernia   ? right  ? Squamous cell carcinoma   ? top of head  ? ?Past Surgical History:  ?Procedure Laterality Date  ? [place]  ? BLADDER SURGERY    ? CARPAL TUNNEL RELEASE Right 11/04/2014  ? Procedure: LIMITED OPEN RIGHT CARPAL TUNNEL RELEASE;  Surgeon: Roseanne Kaufman, MD;  Location: [facility];  Service: Orthopedics;  Laterality: Right;  ? CARPAL TUNNEL WITH CUBITAL TUNNEL Left 07/22/2014  ? Procedure: LEFT CARPAL TUNNEL RELEASE AND CUBITAL TUNNEL RELEASE TRANSPOSITION;  Surgeon: Roseanne Kaufman, MD;  Location: [facility];  Service: Orthopedics;  Laterality: Left;  ? COLONOSCOPY    ? EYE SURGERY    ? laser eye surgery bilat   ? HERNIA REPAIR  07/2011  ? left side  ? INGUINAL HERNIA REPAIR  08/09/2011  ? Procedure: LAPAROSCOPIC INGUINAL HERNIA;  Surgeon: Joyice Faster. Cornett, MD;  Location: [facility];  Service: General;  Laterality: Right;  ? KNEE ARTHROSCOPY  1993  ? left knee  ? KNEE SURGERY    ? age 87- right  ? SQUAMOUS CELL CARCINOMA EXCISION    ? left knee  ? TOTAL  KNEE ARTHROPLASTY Left 05/04/2019  ? Procedure: LEFT TOTAL KNEE ARTHROPLASTY;  Surgeon: Garald Balding, MD;  Location: [facility];  Service: Orthopedics;  Laterality: Left;  ? TOTAL KNEE ARTHROPLASTY Right 05/09/2020  ? Procedure: RIGHT TOTAL KNEE ARTHROPLASTY;  Surgeon: Garald Balding, MD;  Location: [facility];  Service: Orthopedics;  Laterality: Right;  ? ?Social History  ? ?Socioeconomic History  ? Marital status: Married  ?  Spouse name: Not on file  ? Number of children: 1  ? Years of education: Not on file  ? Highest education level: Not on file  ?Occupational  History  ? 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  Employer: [employer]  ?Tobacco Use  ? Smoking status: Some Days  ?  Types: Cigars  ? Smokeless tobacco: Never  ? Tobacco comments:  ?  smokes one every other day  ?Vaping Use  ? Vaping Use: Never used  ?Substance and Sexual Activity  ? Alcohol use: Yes  ?  Alcohol/week: 3.0 - 4.0 standard drinks  ?  Types: 3 - 4 Cans of beer per week  ?  Comment: social  ? Drug use: No  ? Sexual activity: Yes  ?  Partners: Female  ?Other Topics Concern  ? Not on file  ?Social History Narrative  ? Married to [place]  ? exercise--- not regularly  ? 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 Is a paralegal  ? Occasional smoker  ? ?Social Determinants of Health  ? ?Financial Resource Strain: Not on file  ?Food Insecurity: Not on file  ?Transportation Needs: Not on file  ?Physical Activity: Not on file  ?Stress: Not on file  ?Social Connections: Not on file  ? ?Allergies  ?Allergen Reactions  ? Meth-Hyo-M Bl-Na Phos-Ph Sal Other (See Comments)  ?  [facility] believes it may have contributed to renal failure accourding to his spouse who informed us at 17:17 01/19/2021  ? Codeine Nausea Only  ? ?Family History  ?Problem Relation Age of Onset  ? Diabetes Mother   ? Hypertension Mother   ? Heart disease Mother   ? Heart failure Mother   ? Stroke Father 61  ? Parkinsonism Father   ? Depression Brother   ? Alcohol abuse Brother   ? Cancer Maternal Grandmother   ?     bone  ? Colon cancer Neg Hx   ? Colon polyps Neg Hx   ? Esophageal cancer Neg Hx   ? Rectal cancer Neg Hx   ? Stomach cancer Neg Hx   ? ? ?Current Outpatient Medications (Endocrine & Metabolic):  ?  SitaGLIPtin-MetFORMIN HCl (JANUMET XR) 50-500 MG TB24, Take 1 tablet by mouth daily. ? ?Current Outpatient Medications (Cardiovascular):  ?  atorvastatin (LIPITOR) 40 MG tablet, TAKE 1 TABLET BY MOUTH DAILY. ?  diltiazem (CARDIZEM CD) 120 MG 24 hr capsule, TAKE 1 CAPSULE  BY MOUTH ONCE A DAY ?  lisinopril (ZESTRIL) 10 MG tablet, TAKE 1 TABLET BY MOUTH DAILY * NEEDS OFFICE VISIT (Patient not taking: Reported on 07/17/2021) ?  metoprolol tartrate (LOPRESSOR) 50 MG tablet, TAKE 1 TABLET BY MOUTH 2 TIMES DAILY ? ? ?Current Outpatient Medications (Analgesics):  ?  HYDROcodone-acetaminophen (NORCO) 5-325 MG tablet, Take 1 tablet by mouth every 4 (four) hours as needed for moderate pain or severe pain. ?  traMADol (ULTRAM) 50 MG tablet, Take 1 tablet (50 mg total) by mouth every 6 (six) hours as needed. ? ?Current Outpatient  Medications (Hematological):  ?  cyanocobalamin (,VITAMIN B-12,) 1000 MCG/ML injection, Inject 30m into muscle once weekly for 4 weeks, then 123minjected into muscle once monthly there after for 5 months. ? ?Current Outpatient Medications (Other):  ?  gabapentin (NEURONTIN) 100 MG capsule, Take 2 capsules (200 mg total) by mouth at bedtime. ?  amoxicillin (AMOXIL) 500 MG capsule, Take 4 capsules by mouth one hour prior to any dental procedures ?  blood glucose meter kit and supplies KIT, Dispense based on patient and insurance preference. Use up to four times daily as directed. ?  Cholecalciferol (VITAMIN D3) 50 MCG (2000 UT) capsule, Take 2,000 Units by mouth daily. ?  glucose blood (ONE TOUCH TEST STRIPS) test strip, Check blood sugar twice daily ?  INV-pembrolizumab [id number]) 100 MG/4ML SOLN, Inject into the vein. ?  NEEDLE, DISP, 25 G (B-D DISP NEEDLE 25GX1") 25G X 1" MISC, To use w/ B12 injections ?  nystatin-triamcinolone ointment (MYCOLOG), Apply 1 application topically 2 (two) times daily. ?  ONETOUCH DELICA LANCETS FINE MISC, Use qd ?  SYRINGE-NEEDLE, DISP, 3 ML 23G X 1" 3 ML MISC, To inject B12 ? ? ?Reviewed prior external information including notes and imaging from  ?primary care provider ?As well as notes that were available from care everywhere and other healthcare systems. ? ?Past medical history, social, surgical and family history all reviewed in  electronic medical record.  No pertanent information unless stated regarding to the chief complaint.  ? ?Review of Systems: ? No headache, visual changes, nausea, vomiting, diarrhea, constipation, dizziness,

## 2021-10-04 ENCOUNTER — Ambulatory Visit (INDEPENDENT_AMBULATORY_CARE_PROVIDER_SITE_OTHER): Payer: PRIVATE HEALTH INSURANCE | Admitting: Family Medicine

## 2021-10-04 ENCOUNTER — Other Ambulatory Visit: Payer: Self-pay

## 2021-10-04 DIAGNOSIS — M48062 Spinal stenosis, lumbar region with neurogenic claudication: Secondary | ICD-10-CM | POA: Diagnosis not present

## 2021-10-04 MED ORDER — GABAPENTIN 100 MG PO CAPS: 200.0000 mg | ORAL_CAPSULE | Freq: Every day | ORAL | 0 refills | Status: DC

## 2021-10-04 NOTE — Patient Instructions (Signed)
Gabapentin '200mg'$  ?Have many other possibilities in the long run ?Write [place] in 1 months to tell us how its going ?See you again in 2 months ?

## 2021-10-05 DIAGNOSIS — M48062 Spinal stenosis, lumbar region with neurogenic claudication: Secondary | ICD-10-CM | POA: Insufficient documentation

## 2021-10-05 NOTE — Assessment & Plan Note (Signed)
Patient does have spinal stenosis noted.  Discussed with patient at great length.  Due to the severity of the advanced spinal stenosis at 3 levels we did discuss the potential for epidurals.  Patient had one previously and did not feel it made any significant improvement.  Patient will consider it in the future though.  We also discussed the possibility of need for surgical intervention.  Patient is happy though that it does not show any significant metastasis of his bladder cancer.  Patient wants to continue with the conservative therapy.  Has been taking the tramadol on a fairly regular basis and this has been helpful.  We can refill if necessary discussed with patient to continue to work on the core strength and home exercises.  Did start on gabapentin for more of a neurologic complaints and see how patient responds.  Follow-up with me again in 2 months.  Total time with patient reviewing imaging, chart, outside records and talking to patient greater than 32 minutes  ?

## 2021-10-08 ENCOUNTER — Encounter: Payer: Self-pay | Admitting: Family Medicine

## 2021-10-22 ENCOUNTER — Other Ambulatory Visit: Payer: Self-pay | Admitting: Family Medicine

## 2021-10-22 MED ORDER — TRAMADOL HCL 50 MG PO TABS: 50.0000 mg | ORAL_TABLET | Freq: Four times a day (QID) | ORAL | 0 refills | Status: DC | PRN

## 2021-10-23 ENCOUNTER — Encounter: Payer: Self-pay | Admitting: Family Medicine

## 2021-10-23 MED ORDER — DILTIAZEM HCL ER COATED BEADS 120 MG PO CP24: ORAL_CAPSULE | Freq: Every day | ORAL | 1 refills | Status: DC

## 2021-11-01 LAB — HM DIABETES EYE EXAM

## 2021-11-05 ENCOUNTER — Other Ambulatory Visit: Payer: Self-pay | Admitting: Family Medicine

## 2021-11-05 ENCOUNTER — Encounter: Payer: Self-pay | Admitting: Family Medicine

## 2021-11-05 ENCOUNTER — Other Ambulatory Visit (HOSPITAL_COMMUNITY): Payer: Self-pay

## 2021-11-05 DIAGNOSIS — E1169 Type 2 diabetes mellitus with other specified complication: Secondary | ICD-10-CM

## 2021-11-05 MED ORDER — INSULIN PEN NEEDLE 31G X 5 MM MISC
0 refills | Status: DC
  Filled 2021-11-05: qty 100, 30d supply, fill #0

## 2021-11-05 MED ORDER — INSULIN ASPART 100 UNIT/ML IJ SOLN: INTRAMUSCULAR | 99 refills | Status: DC

## 2021-11-05 MED ORDER — INSULIN PEN NEEDLE 31G X 5 MM MISC: 0 refills | Status: DC

## 2021-11-05 MED ORDER — INSULIN ASPART 100 UNIT/ML IJ SOLN
INTRAMUSCULAR | 99 refills | Status: DC
  Filled 2021-11-05: qty 10, 25d supply, fill #0

## 2021-11-06 ENCOUNTER — Other Ambulatory Visit (HOSPITAL_COMMUNITY): Payer: Self-pay

## 2021-11-06 ENCOUNTER — Telehealth: Payer: Self-pay

## 2021-11-06 MED ORDER — INSULIN LISPRO (1 UNIT DIAL) 100 UNIT/ML (KWIKPEN)
PEN_INJECTOR | SUBCUTANEOUS | 99 refills | Status: DC
Start: 2021-11-06 — End: 2022-09-26
  Filled 2021-11-06: qty 12, 30d supply, fill #0
  Filled 2022-07-04 (×2): qty 12, 30d supply, fill #1

## 2021-11-06 MED ORDER — PREDNISONE 20 MG PO TABS
ORAL_TABLET | ORAL | 0 refills | Status: DC
  Filled 2021-11-06: qty 60, 15d supply, fill #0

## 2021-11-06 NOTE — Telephone Encounter (Signed)
Novolog Flexpen not covered by [employer], okay to change to Humalog?  ?

## 2021-11-06 NOTE — Telephone Encounter (Signed)
Humalog sent.

## 2021-11-09 ENCOUNTER — Encounter: Payer: Self-pay | Admitting: Family Medicine

## 2021-11-09 ENCOUNTER — Other Ambulatory Visit (HOSPITAL_COMMUNITY): Payer: Self-pay

## 2021-11-09 MED ORDER — ATORVASTATIN CALCIUM 40 MG PO TABS: ORAL_TABLET | Freq: Every day | ORAL | 1 refills | Status: DC

## 2021-11-15 ENCOUNTER — Encounter: Payer: Self-pay | Admitting: Family Medicine

## 2021-11-15 MED ORDER — GLUCOSE BLOOD VI STRP
ORAL_STRIP | 11 refills | Status: DC
  Filled 2022-07-02: qty 200, 100d supply, fill #0

## 2021-11-15 MED ORDER — ONETOUCH ULTRASOFT LANCETS MISC: 12 refills | Status: DC

## 2021-11-19 ENCOUNTER — Encounter: Payer: Self-pay | Admitting: Family Medicine

## 2021-11-27 ENCOUNTER — Encounter: Payer: Self-pay | Admitting: Family Medicine

## 2021-12-01 ENCOUNTER — Other Ambulatory Visit: Payer: Self-pay | Admitting: Family Medicine

## 2021-12-02 MED ORDER — TRAMADOL HCL 50 MG PO TABS: 50.0000 mg | ORAL_TABLET | Freq: Four times a day (QID) | ORAL | 0 refills | Status: DC | PRN

## 2021-12-04 ENCOUNTER — Other Ambulatory Visit: Payer: Self-pay | Admitting: Family Medicine

## 2021-12-04 DIAGNOSIS — E1165 Type 2 diabetes mellitus with hyperglycemia: Secondary | ICD-10-CM

## 2021-12-04 NOTE — Progress Notes (Unsigned)
[facility] [address] [place] [address] Phone: [phone] Subjective:   Charles Trujillo, am serving as a scribe for Dr. Antoine Primas.  I'm seeing this patient by the request  of:  Donato Schultz, DO  CC: Back pain follow-up  GOY:[id number]  10/04/2021 Patient does have spinal stenosis noted.  Discussed with patient at great length.  Due to the severity of the advanced spinal stenosis at 3 levels we did discuss the potential for epidurals.  Patient had one previously and did not feel it made any significant improvement.  Patient will consider it in the future though.  We also discussed the possibility of need for surgical intervention.  Patient is happy though that it does not show any significant metastasis of his bladder cancer.  Patient wants to continue with the conservative therapy.  Has been taking the tramadol on a fairly regular basis and this has been helpful.  We can refill if necessary discussed with patient to continue to work on the core strength and home exercises.  Did start on gabapentin for more of a neurologic complaints and see how patient responds.  Follow-up with me again in 2 months.  Total time with patient reviewing imaging, chart, outside records and talking to patient greater than 32 minutes   Update 12/06/2021 Charles Trujillo is a 71 y.o. male coming in with complaint of lumbar radiculopathy. Patient states has been getting better. Good for a couple of months. But recently has been aggravated getting pool together for the summer. Epidural didn't help much but thinks its the gabapentin that's helping. Also takes Tylenol and half tramadol.        Past Medical History:  Diagnosis Date   Anxiety    Arthritis    bilateral knees   Depression    DM2 (diabetes mellitus, type 2) ([facility])    Dysrhythmia     benign tachycardia, takes [place]   Elevated LFTs    previously evaluated by Dr Jarold Motto; h/o "fatty liver"   GERD  (gastroesophageal reflux disease)    HLD (hyperlipidemia)    Hypertension    Inguinal hernia    right   Squamous cell carcinoma    top of head   Past Surgical History:  Procedure Laterality Date   ACHILLES TENDON REPAIR  1999   BLADDER SURGERY     CARPAL TUNNEL RELEASE Right 11/04/2014   Procedure: LIMITED OPEN RIGHT CARPAL TUNNEL RELEASE;  Surgeon: Dominica Severin, MD;  Location: [facility];  Service: Orthopedics;  Laterality: Right;   CARPAL TUNNEL WITH CUBITAL TUNNEL Left 07/22/2014   Procedure: LEFT CARPAL TUNNEL RELEASE AND CUBITAL TUNNEL RELEASE TRANSPOSITION;  Surgeon: Dominica Severin, MD;  Location: [facility];  Service: Orthopedics;  Laterality: Left;   COLONOSCOPY     EYE SURGERY     laser eye surgery bilat    HERNIA REPAIR  07/2011   left side   INGUINAL HERNIA REPAIR  08/09/2011   Procedure: LAPAROSCOPIC INGUINAL HERNIA;  Surgeon: Clovis Pu. Cornett, MD;  Location: [facility];  Service: General;  Laterality: Right;   KNEE ARTHROSCOPY  1993   left knee   KNEE SURGERY     age 23- right   SQUAMOUS CELL CARCINOMA EXCISION     left knee   TOTAL KNEE ARTHROPLASTY Left 05/04/2019   Procedure: LEFT TOTAL KNEE ARTHROPLASTY;  Surgeon: Valeria Batman, MD;  Location: [facility];  Service: Orthopedics;  Laterality: Left;  TOTAL KNEE ARTHROPLASTY Right 05/09/2020   Procedure: RIGHT TOTAL KNEE ARTHROPLASTY;  Surgeon: Garald Balding, MD;  Location: [facility];  Service: Orthopedics;  Laterality: Right;   Social History   Socioeconomic History   Marital status: Married    Spouse name: Not on file   Number of children: 1   Years of education: Not on file   Highest education level: Not on file  Occupational History   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, fast food: [employer]  Tobacco Use   Smoking status: Some Days    Types: Cigars   Smokeless tobacco: Never   Tobacco comments:    smokes one every other day   Vaping Use   Vaping Use: Never used  Substance and Sexual Activity   Alcohol use: Yes    Alcohol/week: 3.0 - 4.0 standard drinks    Types: 3 - 4 Cans of beer per week    Comment: social   Drug use: No   Sexual activity: Yes    Partners: Female  Other Topics Concern   Not on file  Social History Narrative   Married to [place]   exercise--- not regularly   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   Is a Radio broadcast assistant   Occasional smoker   Social Determinants of Radio broadcast assistant Strain: Not on file  Food Insecurity: Not on file  Transportation Needs: Not on file  Physical Activity: Not on file  Stress: Not on file  Social Connections: Not on file   Allergies  Allergen Reactions   Meth-Hyo-M Bl-Na Phos-Ph Sal Other (See Comments)    [facility] believes it may have contributed to renal failure accourding to his spouse who informed us at 17:17 01/19/2021   Codeine Nausea Only   Family History  Problem Relation Age of Onset   Diabetes Mother    Hypertension Mother    Heart disease Mother    Heart failure Mother    Stroke Father 25   Parkinsonism Father    Depression Brother    Alcohol abuse Brother    Cancer Maternal Grandmother        bone   Colon cancer Neg Hx    Colon polyps Neg Hx    Esophageal cancer Neg Hx    Rectal cancer Neg Hx    Stomach cancer Neg Hx     Current Outpatient Medications (Endocrine & Metabolic):    insulin lispro (HUMALOG KWIKPEN) 100 UNIT/ML KwikPen, Inject under the skin to max of 40 units per sliding scale   JANUMET XR 50-500 MG TB24, Take 1 tablet by mouth daily.   predniSONE (DELTASONE) 20 MG tablet, Take 4 tablets by mouth daily until instructed to taper by your oncologist  Current Outpatient Medications (Cardiovascular):    atorvastatin (LIPITOR) 40 MG tablet, TAKE 1 TABLET BY MOUTH DAILY.   diltiazem (CARDIZEM CD) 120 MG 24 hr capsule, TAKE 1 CAPSULE BY MOUTH ONCE A DAY   lisinopril (ZESTRIL) 10 MG  tablet, TAKE 1 TABLET BY MOUTH DAILY * NEEDS OFFICE VISIT (Patient not taking: Reported on 07/17/2021)   metoprolol tartrate (LOPRESSOR) 50 MG tablet, TAKE 1 TABLET BY MOUTH 2 TIMES DAILY   Current Outpatient Medications (Analgesics):    HYDROcodone-acetaminophen (NORCO) 5-325 MG tablet, Take 1 tablet by mouth every 4 (four) hours as needed for moderate pain or severe pain.   traMADol (ULTRAM) 50 MG tablet, Take 1 tablet (50 mg total) by  mouth every 6 (six) hours as needed.  Current Outpatient Medications (Hematological):    cyanocobalamin (,VITAMIN B-12,) 1000 MCG/ML injection, Inject 96m into muscle once weekly for 4 weeks, then 165minjected into muscle once monthly there after for 5 months.  Current Outpatient Medications (Other):    amoxicillin (AMOXIL) 500 MG capsule, Take 4 capsules by mouth one hour prior to any dental procedures   blood glucose meter kit and supplies KIT, Dispense based on patient and insurance preference. Use up to four times daily as directed.   Cholecalciferol (VITAMIN D3) 50 MCG (2000 UT) capsule, Take 2,000 Units by mouth daily.   gabapentin (NEURONTIN) 100 MG capsule, Take 2 capsules (200 mg total) by mouth at bedtime.   glucose blood test strip, Check blood sugars twice daily   Insulin Pen Needle 31G X 5 MM MISC, As directed   INV-pembrolizumab [id number] ([id number]) 100 MG/4ML SOLN, Inject into the vein.   Lancets (ONETOUCH ULTRASOFT) lancets, Check blood sugars twice daily   NEEDLE, DISP, 25 G (B-D DISP NEEDLE 25GX1") 25G X 1" MISC, To use w/ B12 injections   nystatin-triamcinolone ointment (MYCOLOG), Apply 1 application topically 2 (two) times daily.   SYRINGE-NEEDLE, DISP, 3 ML 23G X 1" 3 ML MISC, To inject B12   Reviewed prior external information including notes and imaging from  primary care provider As well as notes that were available from care everywhere and other healthcare systems.  Reviewing patient's note while patient undergoes chemotherapy patient  did have a rash and has been on chronic prednisone at this time.  Past medical history, social, surgical and family history all reviewed in electronic medical record.  No pertanent information unless stated regarding to the chief complaint.   Review of Systems:  No headache, visual changes, nausea, vomiting, diarrhea, constipation, dizziness, abdominal pain, skin rash, fevers, chills, night sweats, weight loss, swollen lymph nodes, joint swelling, chest pain, shortness of breath, mood changes. POSITIVE muscle aches, body aches  Objective  Blood pressure (!) 140/96, pulse 75, height _0  (1.702 m), weight 191 lb (86.6 kg), SpO2 96 %.   General: No apparent distress alert and oriented x3 mood and affect normal, dressed appropriately.  HEENT: Pupils equal, extraocular movements intact  Respiratory: Patient's speak in full sentences and does not appear short of breath  Cardiovascular: No lower extremity edema, non tender, no erythema  Gait normal with good balance and coordination.  MSK: Low back exam does have some loss of lordosis.  Sitting comfortably though.  Neurovascular intact distally.  No significant weakness at the moment.  Still has limited range of motion in all. Impression and Recommendations:    The above documentation has been reviewed and is accurate and complete Charles PulleyDO

## 2021-12-06 ENCOUNTER — Ambulatory Visit (INDEPENDENT_AMBULATORY_CARE_PROVIDER_SITE_OTHER): Payer: PRIVATE HEALTH INSURANCE | Admitting: Family Medicine

## 2021-12-06 DIAGNOSIS — M48062 Spinal stenosis, lumbar region with neurogenic claudication: Secondary | ICD-10-CM | POA: Diagnosis not present

## 2021-12-06 NOTE — Patient Instructions (Signed)
Always a pleasure Tylenol 325-'500mg'$  3 times a day Use half tramadol only when needed Ask Oncologist about bridging the prednisone with DHEA '50mg'$  daily for 4 weeks D like the idea of flex working at home See you again in 4 months

## 2021-12-06 NOTE — Assessment & Plan Note (Signed)
Patient does have severe spinal stenosis but is doing well with the conservative therapy at this time.  Patient did add the gabapentin and will do have room to increase if necessary.  Patient has had only 1 exacerbation since we have seen him.  Has been taking the tramadol but will start to try to decrease this and we discussed taking the Tylenol on a more regular basis.  Patient has had hydrocodone but has not been taking this at all at the moment.  Discussed icing regimen and home exercises otherwise.  Follow-up with me again in 3 to 4 months.  Total time reviewing patient's chart and discussing with patient 32 minutes

## 2021-12-11 ENCOUNTER — Encounter: Payer: Self-pay | Admitting: Family Medicine

## 2021-12-11 ENCOUNTER — Ambulatory Visit: Payer: PRIVATE HEALTH INSURANCE | Admitting: Family Medicine

## 2021-12-11 VITALS — BP 124/70 | HR 79 | Temp 98.1°F | Resp 18 | Ht 67.0 in | Wt 193.4 lb

## 2021-12-11 DIAGNOSIS — I10 Essential (primary) hypertension: Secondary | ICD-10-CM

## 2021-12-11 DIAGNOSIS — M48061 Spinal stenosis, lumbar region without neurogenic claudication: Secondary | ICD-10-CM

## 2021-12-11 DIAGNOSIS — E1165 Type 2 diabetes mellitus with hyperglycemia: Secondary | ICD-10-CM

## 2021-12-11 DIAGNOSIS — E785 Hyperlipidemia, unspecified: Secondary | ICD-10-CM

## 2021-12-11 DIAGNOSIS — C678 Malignant neoplasm of overlapping sites of bladder: Secondary | ICD-10-CM

## 2021-12-11 DIAGNOSIS — E1169 Type 2 diabetes mellitus with other specified complication: Secondary | ICD-10-CM | POA: Diagnosis not present

## 2021-12-11 NOTE — Assessment & Plan Note (Signed)
Well controlled, no changes to meds. Encouraged heart healthy diet such as the DASH diet and exercise as tolerated.  °

## 2021-12-11 NOTE — Assessment & Plan Note (Signed)
Lab Results  Component Value Date   HGBA1C 6.8 (H) 05/29/2021   Pt just finished pred taper

## 2021-12-11 NOTE — Patient Instructions (Signed)

## 2021-12-11 NOTE — Progress Notes (Signed)
Subjective:   By signing my name below, I, [name], attest that this documentation has been prepared under the direction and in the presence of [name], DO. 12/11/2021    Patient ID: Charles Trujillo, male    DOB: [birth year], 71 y.o.   MRN: [id number]  Chief Complaint  Patient presents with   Hypertension   Follow-up    Hypertension Pertinent negatives include no blurred vision, chest pain, headaches, malaise/fatigue, palpitations or shortness of breath.  Patient is in today for a follow up visit.   He reports not having chemotherapy in the past month due to his painful rash on both his feet. He continues seeing his oncologist regularly.  He reports his blood sugars have measured elevated due to taking prednisone previously. He is not taking it at this time. He continues taking Janumet XR and reports no new issues while taking it. He notes his dosage was reduced while in the hospital due to his blood sugar reading low.  Lab Results  Component Value Date   HGBA1C 6.8 (H) 05/29/2021   He continues taking 50 mg metoprolol tartrate daily PO, 120 mg diltiazem daily PO and reports no new issues while taking it. His blood pressure is doing well during this visit. He denies having any swelling in his ankles.  BP Readings from Last 3 Encounters:  12/11/21 124/70  12/06/21 (!) 140/96  10/04/21 124/70   Pulse Readings from Last 3 Encounters:  12/11/21 79  12/06/21 75  10/04/21 82   He continues taking hydrocodone PRN, gabapentin PRN, tramadol PRN and reports no new issues while taking them. He also continues taking Amoxicillin PRN and notes he only takes it before his dental appointments.    Past Medical History:  Diagnosis Date   Anxiety    Arthritis    bilateral knees   Depression    DM2 (diabetes mellitus, type 2) (HCC)    Dysrhythmia     benign tachycardia, takes Cartia   Elevated LFTs    previously evaluated by Dr Sharlett Iles; h/o "fatty liver"   GERD  (gastroesophageal reflux disease)    HLD (hyperlipidemia)    Hypertension    Inguinal hernia    right   Squamous cell carcinoma    top of head    Past Surgical History:  Procedure Laterality Date   ACHILLES TENDON REPAIR  1999   BLADDER SURGERY     CARPAL TUNNEL RELEASE Right 11/04/2014   Procedure: LIMITED OPEN RIGHT CARPAL TUNNEL RELEASE;  Surgeon: Roseanne Kaufman, MD;  Location: [facility];  Service: Orthopedics;  Laterality: Right;   CARPAL TUNNEL WITH CUBITAL TUNNEL Left 07/22/2014   Procedure: LEFT CARPAL TUNNEL RELEASE AND CUBITAL TUNNEL RELEASE TRANSPOSITION;  Surgeon: Roseanne Kaufman, MD;  Location: [facility];  Service: Orthopedics;  Laterality: Left;   COLONOSCOPY     EYE SURGERY     laser eye surgery bilat    HERNIA REPAIR  07/2011   left side   INGUINAL HERNIA REPAIR  08/09/2011   Procedure: LAPAROSCOPIC INGUINAL HERNIA;  Surgeon: Joyice Faster. Cornett, MD;  Location: [facility];  Service: General;  Laterality: Right;   KNEE ARTHROSCOPY  1993   left knee   KNEE SURGERY     age 62- right   SQUAMOUS CELL CARCINOMA EXCISION     left knee   TOTAL KNEE ARTHROPLASTY Left 05/04/2019   Procedure: LEFT TOTAL KNEE ARTHROPLASTY;  Surgeon: Garald Balding, MD;  Location: [facility];  Service: Orthopedics;  Laterality: Left;   TOTAL KNEE ARTHROPLASTY Right 05/09/2020   Procedure: RIGHT TOTAL KNEE ARTHROPLASTY;  Surgeon: Garald Balding, MD;  Location: [facility];  Service: Orthopedics;  Laterality: Right;    Family History  Problem Relation Age of Onset   Diabetes Mother    Hypertension Mother    Heart disease Mother    Heart failure Mother    Stroke Father 37   Parkinsonism Father    Depression Brother    Alcohol abuse Brother    Cancer Maternal Grandmother        bone   Colon cancer Neg Hx    Colon polyps Neg Hx    Esophageal cancer Neg Hx    Rectal cancer Neg Hx    Stomach cancer Neg Hx     Social History   Socioeconomic History   Marital  status: Married    Spouse name: Not on file   Number of children: 1   Years of education: Not on file   Highest education level: Not on file  Occupational History   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, fast food: [employer]  Tobacco Use   Smoking status: Some Days    Types: Cigars   Smokeless tobacco: Never   Tobacco comments:    smokes one every other day  Vaping Use   Vaping Use: Never used  Substance and Sexual Activity   Alcohol use: Yes    Alcohol/week: 3.0 - 4.0 standard drinks    Types: 3 - 4 Cans of beer per week    Comment: social   Drug use: No   Sexual activity: Yes    Partners: Female  Other Topics Concern   Not on file  Social History Narrative   Married to [place]   exercise--- not regularly   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 90 tablet 1   blood glucose meter kit and supplies KIT Dispense based on patient and insurance preference. Use up to four times daily as directed. 1 each 0   Cholecalciferol (VITAMIN D3) 50 MCG (2000 UT) capsule Take 2,000 Units by mouth daily.     cyanocobalamin (,VITAMIN B-12,) 1000 MCG/ML injection Inject 66m into muscle once weekly for 4 weeks, then 175minjected into muscle once monthly there after for 5 months. 10 mL 0   diltiazem (CARDIZEM CD) 120 MG 24 hr capsule TAKE 1 CAPSULE BY MOUTH ONCE A DAY 90  capsule 1   gabapentin (NEURONTIN) 100 MG capsule Take 2 capsules (200 mg total) by mouth at bedtime. 180 capsule 0   glucose blood test strip Check blood sugars twice daily 200 each 12   HYDROcodone-acetaminophen (NORCO) 5-325 MG tablet Take 1 tablet by mouth every 4 (four) hours as needed for moderate pain or severe pain. 20 tablet 0  insulin lispro (HUMALOG KWIKPEN) 100 UNIT/ML KwikPen Inject under the skin to max of 40 units per sliding scale 15 mL PRN   Insulin Pen Needle 31G X 5 MM MISC As directed 100 each 0   INV-pembrolizumab EA3191 (MK-3475) 100 MG/4ML SOLN Inject into the vein.     JANUMET XR 50-500 MG TB24 Take 1 tablet by mouth daily. 30 tablet 2   Lancets (ONETOUCH ULTRASOFT) lancets Check blood sugars twice daily 200 each 12   NEEDLE, DISP, 25 G (B-D DISP NEEDLE 25GX1") 25G X 1" MISC To use w/ B12 injections 50 each 0   nystatin-triamcinolone ointment (MYCOLOG) Apply 1 application topically 2 (two) times daily. 30 g 1   predniSONE (DELTASONE) 20 MG tablet Take 4 tablets by mouth daily until instructed to taper by your oncologist 60 tablet 0   SYRINGE-NEEDLE, DISP, 3 ML 23G X 1" 3 ML MISC To inject B12 50 each 0   traMADol (ULTRAM) 50 MG tablet Take 1 tablet (50 mg total) by mouth every 6 (six) hours as needed. 20 tablet 0   metoprolol tartrate (LOPRESSOR) 50 MG tablet TAKE 1 TABLET BY MOUTH 2 TIMES DAILY 180 tablet 3   lisinopril (ZESTRIL) 10 MG tablet TAKE 1 TABLET BY MOUTH DAILY * NEEDS OFFICE VISIT (Patient not taking: Reported on 07/17/2021) 90 tablet 1   No facility-administered medications prior to visit.    Allergies  Allergen Reactions   Meth-Hyo-M Bl-Na Phos-Ph Sal Other (See Comments)    [place] believes it may have contributed to renal failure accourding to his spouse who informed us at 17:17 01/19/2021   Codeine Nausea Only    Review of Systems  Constitutional:  Negative for fever and malaise/fatigue.  HENT:  Negative for congestion.   Eyes:  Negative for  blurred vision.  Respiratory:  Negative for cough and shortness of breath.   Cardiovascular:  Negative for chest pain, palpitations and leg swelling.  Gastrointestinal:  Negative for vomiting.  Musculoskeletal:  Negative for back pain.  Skin:  Negative for rash.  Neurological:  Negative for loss of consciousness and headaches.      Objective:    Physical Exam Vitals and nursing note reviewed.  Constitutional:      General: He is not in acute distress.    Appearance: Normal appearance. He is not ill-appearing.  HENT:     Head: Normocephalic and atraumatic.     Right Ear: External ear normal.     Left Ear: External ear normal.  Eyes:     Extraocular Movements: Extraocular movements intact.     Pupils: Pupils are equal, round, and reactive to light.  Cardiovascular:     Rate and Rhythm: Normal rate and regular rhythm.     Heart sounds: Normal heart sounds. No murmur heard.   No gallop.  Pulmonary:     Effort: Pulmonary effort is normal. No respiratory distress.     Breath sounds: Normal breath sounds. No wheezing or rales.  Skin:    General: Skin is warm and dry.  Neurological:     Mental Status: He is alert and oriented to person, place, and time.  Psychiatric:        Judgment: Judgment normal.    BP 124/70 (BP Location: Left Arm, Patient Position: Sitting, Cuff Size: Normal)   Pulse 79   Temp 98.1 F (36.7 C) (Oral)   Resp 18   Ht '5\' 7"'  (1.702 m)   Wt 193 lb 6.4 oz (87.7 kg)   SpO2  97%   BMI 30.29 kg/m  Wt Readings from Last 3 Encounters:  12/11/21 193 lb 6.4 oz (87.7 kg)  12/06/21 191 lb (86.6 kg)  10/04/21 194 lb (88 kg)    Diabetic Foot Exam - Simple   No data filed    Lab Results  Component Value Date   WBC 10.8 (H) 07/04/2021   HGB 16.2 07/04/2021   HCT 49.0 07/04/2021   PLT 246 07/04/2021   GLUCOSE 124 (H) 07/04/2021   CHOL 125 05/29/2021   TRIG 85.0 05/29/2021   HDL 48.00 05/29/2021   LDLDIRECT 143.5 02/01/2011   LDLCALC 60 05/29/2021   ALT  42 07/04/2021   AST 37 07/04/2021   NA 136 07/04/2021   K 3.8 07/04/2021   CL 106 07/04/2021   CREATININE 0.94 07/04/2021   BUN 12 07/04/2021   CO2 24 07/04/2021   TSH 1.94 05/29/2021   PSA 0.19 07/10/2021   INR 1.2 05/01/2020   HGBA1C 6.8 (H) 05/29/2021   MICROALBUR 29.4 (H) 08/31/2020    Lab Results  Component Value Date   TSH 1.94 05/29/2021   Lab Results  Component Value Date   WBC 10.8 (H) 07/04/2021   HGB 16.2 07/04/2021   HCT 49.0 07/04/2021   MCV 88.8 07/04/2021   PLT 246 07/04/2021   Lab Results  Component Value Date   NA 136 07/04/2021   K 3.8 07/04/2021   CO2 24 07/04/2021   GLUCOSE 124 (H) 07/04/2021   BUN 12 07/04/2021   CREATININE 0.94 07/04/2021   BILITOT 0.6 07/04/2021   ALKPHOS 58 07/04/2021   AST 37 07/04/2021   ALT 42 07/04/2021   PROT 7.3 07/04/2021   ALBUMIN 4.0 07/04/2021   CALCIUM 8.5 (L) 07/04/2021   ANIONGAP 6 07/04/2021   GFR 69.62 05/29/2021   Lab Results  Component Value Date   CHOL 125 05/29/2021   Lab Results  Component Value Date   HDL 48.00 05/29/2021   Lab Results  Component Value Date   LDLCALC 60 05/29/2021   Lab Results  Component Value Date   TRIG 85.0 05/29/2021   Lab Results  Component Value Date   CHOLHDL 3 05/29/2021   Lab Results  Component Value Date   HGBA1C 6.8 (H) 05/29/2021       Assessment & Plan:   Problem List Items Addressed This Visit       Unprioritized   Hyperlipidemia associated with type 2 diabetes mellitus ([place])   Relevant Orders   Lipid panel   Hemoglobin A1c   Comprehensive metabolic panel   Comprehensive metabolic panel   Hemoglobin A1c   Lipid panel   Uncontrolled type 2 diabetes mellitus with hyperglycemia ([place])    Lab Results  Component Value Date   HGBA1C 6.8 (H) 05/29/2021  Pt just finished pred taper       Malignant neoplasm of overlapping sites of bladder ([place])    Per onc        Essential hypertension    Well controlled, no changes to meds. Encouraged  heart healthy diet such as the DASH diet and exercise as tolerated.        Other Visit Diagnoses     Type 2 diabetes mellitus with hyperglycemia, without long-term current use of insulin (HCC)    -  Primary   Relevant Orders   Lipid panel   Hemoglobin A1c   Comprehensive metabolic panel   Comprehensive metabolic panel   Hemoglobin A1c   Lipid panel   Primary hypertension  Relevant Orders   Lipid panel   Hemoglobin A1c   Comprehensive metabolic panel   Comprehensive metabolic panel   Hemoglobin A1c   Lipid panel   Spinal stenosis of lumbar region, unspecified whether neurogenic claudication present            No orders of the defined types were placed in this encounter.   IAnn Held, DO, personally preformed the services described in this documentation.  All medical record entries made by the scribe were at my direction and in my presence.  I have reviewed the chart and discharge instructions (if applicable) and agree that the record reflects my personal performance and is accurate and complete. 12/11/2021   I,[name],acting as a scribe for [name], DO.,have documented all relevant documentation on the behalf of [name], DO,as directed by  [name], DO while in the presence of [name], DO.   [name], DO

## 2021-12-11 NOTE — Assessment & Plan Note (Signed)
Per onc  

## 2021-12-30 ENCOUNTER — Encounter: Payer: Self-pay | Admitting: Family Medicine

## 2022-01-01 MED ORDER — METOPROLOL TARTRATE 50 MG PO TABS: ORAL_TABLET | Freq: Two times a day (BID) | ORAL | 1 refills | Status: DC

## 2022-01-02 ENCOUNTER — Other Ambulatory Visit: Payer: Self-pay | Admitting: Family Medicine

## 2022-01-11 ENCOUNTER — Other Ambulatory Visit: Payer: PRIVATE HEALTH INSURANCE

## 2022-01-11 ENCOUNTER — Encounter: Payer: Self-pay | Admitting: Family Medicine

## 2022-01-15 ENCOUNTER — Encounter: Payer: Self-pay | Admitting: Family Medicine

## 2022-01-16 ENCOUNTER — Encounter: Payer: Self-pay | Admitting: Family Medicine

## 2022-01-16 NOTE — Telephone Encounter (Signed)
Looks like there is future orders in. Should he come in just for a A1C?

## 2022-01-18 ENCOUNTER — Other Ambulatory Visit (INDEPENDENT_AMBULATORY_CARE_PROVIDER_SITE_OTHER): Payer: PRIVATE HEALTH INSURANCE

## 2022-01-18 DIAGNOSIS — E1165 Type 2 diabetes mellitus with hyperglycemia: Secondary | ICD-10-CM

## 2022-01-18 DIAGNOSIS — I10 Essential (primary) hypertension: Secondary | ICD-10-CM

## 2022-01-18 DIAGNOSIS — E1169 Type 2 diabetes mellitus with other specified complication: Secondary | ICD-10-CM

## 2022-01-18 NOTE — Addendum Note (Signed)
Addended by: Kelle Darting A on: 01/18/2022 03:02 PM   Modules accepted: Orders

## 2022-01-19 LAB — HEMOGLOBIN A1C
Hgb A1c MFr Bld: 7.3 % of total Hgb — ABNORMAL HIGH (ref <5.7)
Mean Plasma Glucose: 163 mg/dL
eAG (mmol/L): 9 mmol/L

## 2022-01-19 LAB — LIPID PANEL
Cholesterol: 143 mg/dL (ref <200)
HDL: 64 mg/dL (ref >=40)
LDL Cholesterol (Calc): 58 mg/dL (calc)
Non-HDL Cholesterol (Calc): 79 mg/dL (calc) (ref <130)
Total CHOL/HDL Ratio: 2.2 (calc) (ref <5.0)
Triglycerides: 120 mg/dL (ref <150)

## 2022-01-31 ENCOUNTER — Other Ambulatory Visit: Payer: Self-pay | Admitting: Family Medicine

## 2022-01-31 DIAGNOSIS — I10 Essential (primary) hypertension: Secondary | ICD-10-CM

## 2022-01-31 DIAGNOSIS — E1165 Type 2 diabetes mellitus with hyperglycemia: Secondary | ICD-10-CM

## 2022-01-31 DIAGNOSIS — E1169 Type 2 diabetes mellitus with other specified complication: Secondary | ICD-10-CM

## 2022-02-01 ENCOUNTER — Other Ambulatory Visit: Payer: Self-pay

## 2022-02-01 MED ORDER — SITAGLIP PHOS-METFORMIN HCL ER 100-1000 MG PO TB24: 1.0000 | ORAL_TABLET | Freq: Every day | ORAL | 1 refills | Status: DC

## 2022-02-28 ENCOUNTER — Other Ambulatory Visit: Payer: PRIVATE HEALTH INSURANCE

## 2022-04-01 ENCOUNTER — Other Ambulatory Visit: Payer: Self-pay | Admitting: Family Medicine

## 2022-04-03 ENCOUNTER — Encounter: Payer: Self-pay | Admitting: Family Medicine

## 2022-04-07 ENCOUNTER — Encounter: Payer: Self-pay | Admitting: Family Medicine

## 2022-04-09 NOTE — Progress Notes (Unsigned)
[name] [place] Phone: [phone] Subjective:   [name], am serving as a scribe for Dr. Hulan Saas.  I'm seeing this patient by the request  of:  [name], DO  CC: Right ankle pain follow-up  [id number]  Charles Trujillo is a 71 y.o. male coming in with complaint of ankle pain. Here for PRP. Patient states that his R ankle is painful over lateral aspect. Constant pain. Feels like he inverts ankle when walking to alleviate his pain. Uses Tylenol.       Past Medical History:  Diagnosis Date   Anxiety    Arthritis    bilateral knees   Depression    DM2 (diabetes mellitus, type 2) (HCC)    Dysrhythmia     benign tachycardia, takes Cartia   Elevated LFTs    previously evaluated by Dr Sharlett Iles; h/o "fatty liver"   GERD (gastroesophageal reflux disease)    HLD (hyperlipidemia)    Hypertension    Inguinal hernia    right   Squamous cell carcinoma    top of head   Past Surgical History:  Procedure Laterality Date   ACHILLES TENDON REPAIR  1999   BLADDER SURGERY     CARPAL TUNNEL RELEASE Right 11/04/2014   Procedure: LIMITED OPEN RIGHT CARPAL TUNNEL RELEASE;  Surgeon: Roseanne Kaufman, MD;  Location: [facility];  Service: Orthopedics;  Laterality: Right;   CARPAL TUNNEL WITH CUBITAL TUNNEL Left 07/22/2014   Procedure: LEFT CARPAL TUNNEL RELEASE AND CUBITAL TUNNEL RELEASE TRANSPOSITION;  Surgeon: Roseanne Kaufman, MD;  Location: [facility];  Service: Orthopedics;  Laterality: Left;   COLONOSCOPY     EYE SURGERY     laser eye surgery bilat    HERNIA REPAIR  07/2011   left side   INGUINAL HERNIA REPAIR  08/09/2011   Procedure: LAPAROSCOPIC INGUINAL HERNIA;  Surgeon: Joyice Faster. Cornett, MD;  Location: [facility];  Service: General;  Laterality: Right;   KNEE ARTHROSCOPY  1993   left knee   KNEE SURGERY     age 64- right   SQUAMOUS CELL CARCINOMA EXCISION      left knee   TOTAL KNEE ARTHROPLASTY Left 05/04/2019   Procedure: LEFT TOTAL KNEE ARTHROPLASTY;  Surgeon: Garald Balding, MD;  Location: [facility];  Service: Orthopedics;  Laterality: Left;   TOTAL KNEE ARTHROPLASTY Right 05/09/2020   Procedure: RIGHT TOTAL KNEE ARTHROPLASTY;  Surgeon: Garald Balding, MD;  Location[facility];  Service: Orthopedics;  Laterality: Right;   Social History   Socioeconomic History   Marital status: Married    Spouse name: Not on file   Number of children: 1   Years of education: Not on file   Highest education level: Not on file  Occupational History   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, fast food: [employer]  Tobacco Use   Smoking status: Some Days    Types: Cigars   Smokeless tobacco: Never   Tobacco comments:    smokes one every other day  Vaping Use   Vaping Use: Never used  Substance and Sexual Activity   Alcohol use: Yes    Alcohol/week: 3.0 - 4.0 standard drinks of alcohol    Types: 3 - 4 Cans of beer per week    Comment: social   Drug use: No   Sexual activity: Yes    Partners: Female  Other Topics  Concern   Not on file  Social History Narrative   Married to [place]   exercise--- not regularly   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   Occasional smoker   Social Determinants of Radio broadcast assistant Strain: Not on file  Food Insecurity: Not on file  Transportation Needs: Not on file  Physical Activity: Not on file  Stress: Not on file  Social Connections: Not on file   Allergies  Allergen Reactions   Meth-Hyo-M Bl-Na Phos-Ph Sal Other (See Comments)    [place] believes it may have contributed to renal failure accourding to his spouse who informed us at 17:17 01/19/2021   Codeine Nausea Only   Family History  Problem Relation Age of Onset   Diabetes Mother    Hypertension Mother    Heart disease Mother    Heart failure Mother     Stroke Father 37   Parkinsonism Father    Depression Brother    Alcohol abuse Brother    Cancer Maternal Grandmother        bone   Colon cancer Neg Hx    Colon polyps Neg Hx    Esophageal cancer Neg Hx    Rectal cancer Neg Hx    Stomach cancer Neg Hx        Objective  Blood pressure 122/76, pulse 88, height '5\' 7"'$  (1.702 m), weight 192 lb (87.1 kg), SpO2 97 %.   General: No apparent distress alert and oriented x3 mood and affect normal, dressed appropriately.  HEENT: Pupils equal, extraocular movements intact  Respiratory: Patient's speak in full sentences and does not appear short of breath  Cardiovascular: No lower extremity edema, non tender, no erythema  Antalgic gait noted.  Right ankle does have arthritic changes noted.   Procedure: Real-time Ultrasound Guided Injection of right anterior ankle Device: GE Logiq Q7 Ultrasound guided injection is preferred based studies that show increased duration, increased effect, greater accuracy, decreased procedural pain, increased response rate, and decreased cost with ultrasound guided versus blind injection.  Verbal informed consent obtained.  Time-out conducted.  Noted no overlying erythema, induration, or other signs of local infection.  Skin prepped in a sterile fashion.  Local anesthesia: Topical Ethyl chloride.  With sterile technique and under real time ultrasound guidance: With a 21-gauge 2 inch needle injected into the anterior lateral ankle joint with a total of 0.5 cc of 0.5% Marcaine and then injected with 3.5 cc of PRP. Completed without difficulty  Pain immediately improved suggesting accurate placement of the medication.  Advised to call if fevers/chills, erythema, induration, drainage, or persistent bleeding.  Impression: Technically successful ultrasound guided injection.   Impression and Recommendations:     The above documentation has been reviewed and is accurate and complete Lyndal Pulley, DO

## 2022-04-11 ENCOUNTER — Ambulatory Visit (INDEPENDENT_AMBULATORY_CARE_PROVIDER_SITE_OTHER): Payer: Self-pay | Admitting: Family Medicine

## 2022-04-11 ENCOUNTER — Encounter: Payer: Self-pay | Admitting: Family Medicine

## 2022-04-11 DIAGNOSIS — M19071 Primary osteoarthritis, right ankle and foot: Secondary | ICD-10-CM

## 2022-04-11 NOTE — Assessment & Plan Note (Signed)
Patient was given PRP today.  Post PRP instructions given.  Discussed icing regimen and home exercises otherwise.  Increase activity slowly.  We will follow-up again in 6 to 8 weeks.

## 2022-04-11 NOTE — Patient Instructions (Signed)
No ice or IBU for 3 days Heat and Tylenol are ok See me again in 6 weeks 

## 2022-04-15 ENCOUNTER — Encounter: Payer: Self-pay | Admitting: Family Medicine

## 2022-04-18 ENCOUNTER — Ambulatory Visit: Payer: PRIVATE HEALTH INSURANCE | Admitting: Family Medicine

## 2022-05-03 ENCOUNTER — Other Ambulatory Visit: Payer: Self-pay | Admitting: Family Medicine

## 2022-05-04 ENCOUNTER — Other Ambulatory Visit: Payer: Self-pay | Admitting: Family Medicine

## 2022-05-30 ENCOUNTER — Other Ambulatory Visit: Payer: Self-pay | Admitting: Orthopaedic Surgery

## 2022-05-30 ENCOUNTER — Ambulatory Visit: Payer: PRIVATE HEALTH INSURANCE | Admitting: Family Medicine

## 2022-06-14 NOTE — Progress Notes (Unsigned)
[place] Phone: [phone] Subjective:   Charles Trujillo, am serving as a scribe for Dr. Hulan Saas.  I'm seeing this patient by the request  of:  [name], DO  CC: Right ankle pain follow-up  [id number]  04/11/2022 Patient was given PRP today.  Post PRP instructions given.  Discussed icing regimen and home exercises otherwise.  Increase activity slowly.  We will follow-up again in 6 to 8 weeks.   Update 06/17/2022 Charles Trujillo is a 71 y.o. male coming in with complaint of R ankle. PRP last visit. Patient states that he is doing well. Pain subsided since injection.        Past Medical History:  Diagnosis Date   Anxiety    Arthritis    bilateral knees   Depression    DM2 (diabetes mellitus, type 2) (HCC)    Dysrhythmia     benign tachycardia, takes Cartia   Elevated LFTs    previously evaluated by Dr Sharlett Iles; h/o "fatty liver"   GERD (gastroesophageal reflux disease)    HLD (hyperlipidemia)    Hypertension    Inguinal hernia    right   Squamous cell carcinoma    top of head   Past Surgical History:  Procedure Laterality Date   ACHILLES TENDON REPAIR  1999   BLADDER SURGERY     CARPAL TUNNEL RELEASE Right 11/04/2014   Procedure: LIMITED OPEN RIGHT CARPAL TUNNEL RELEASE;  Surgeon: Roseanne Kaufman, MD;  Location: [place];  Service: Orthopedics;  Laterality: Right;   CARPAL TUNNEL WITH CUBITAL TUNNEL Left 07/22/2014   Procedure: LEFT CARPAL TUNNEL RELEASE AND CUBITAL TUNNEL RELEASE TRANSPOSITION;  Surgeon: Roseanne Kaufman, MD;  Location: [facility];  Service: Orthopedics;  Laterality: Left;   COLONOSCOPY     EYE SURGERY     laser eye surgery bilat    HERNIA REPAIR  07/2011   left side   INGUINAL HERNIA REPAIR  08/09/2011   Procedure: LAPAROSCOPIC INGUINAL HERNIA;  Surgeon: Joyice Faster. Cornett, MD;  Location: [facility];  Service: General;   Laterality: Right;   KNEE ARTHROSCOPY  1993   left knee   KNEE SURGERY     age 40- right   SQUAMOUS CELL CARCINOMA EXCISION     left knee   TOTAL KNEE ARTHROPLASTY Left 05/04/2019   Procedure: LEFT TOTAL KNEE ARTHROPLASTY;  Surgeon: Garald Balding, MD;  Location[facility];  Service: Orthopedics;  Laterality: Left;   TOTAL KNEE ARTHROPLASTY Right 05/09/2020   Procedure: RIGHT TOTAL KNEE ARTHROPLASTY;  Surgeon: Garald Balding, MD;  [facility];  Service: Orthopedics;  Laterality: Right;   Social History   Socioeconomic History   Marital status: Married    Spouse name: Not on file   Number of children: 1   Years of education: Not on file   Highest education level: Not on file  Occupational History   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, fast food: [employer]  Tobacco Use   Smoking status: Some Days    Types: Cigars   Smokeless tobacco: Never   Tobacco comments:    smokes one every other day  Vaping Use   Vaping Use: Never used  Substance and Sexual Activity   Alcohol use: Yes    Alcohol/week: 3.0 - 4.0 standard drinks of alcohol    Types: 3 - 4 Cans of beer per week  Comment: social   Drug use: Trujillo   Sexual activity: Yes    Partners: Female  Other Topics Concern   Not on file  Social History Narrative   Married to [place]   exercise--- not regularly   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   Is a Radio broadcast assistant   Occasional smoker   Social Determinants of Radio broadcast assistant Strain: Not on file  Food Insecurity: Not on file  Transportation Needs: Not on file  Physical Activity: Not on file  Stress: Not on file  Social Connections: Not on file   Allergies  Allergen Reactions   Meth-Hyo-M Bl-Na Phos-Ph Sal Other (See Comments)    [place] believes it may have contributed to renal failure accourding to his spouse who informed us at 17:17 01/19/2021   Codeine Nausea Only   Family  History  Problem Relation Age of Onset   Diabetes Mother    Hypertension Mother    Heart disease Mother    Heart failure Mother    Stroke Father 63   Parkinsonism Father    Depression Brother    Alcohol abuse Brother    Cancer Maternal Grandmother        bone   Colon cancer Neg Hx    Colon polyps Neg Hx    Esophageal cancer Neg Hx    Rectal cancer Neg Hx    Stomach cancer Neg Hx     Current Outpatient Medications (Endocrine & Metabolic):    insulin lispro (HUMALOG KWIKPEN) 100 UNIT/ML KwikPen, Inject under the skin to max of 40 units per sliding scale   predniSONE (DELTASONE) 20 MG tablet, Take 4 tablets by mouth daily until instructed to taper by your oncologist   SitaGLIPtin-MetFORMIN HCl [phone] MG TB24, Take 1 tablet by mouth daily.  Current Outpatient Medications (Cardiovascular):    atorvastatin (LIPITOR) 40 MG tablet, TAKE 1 TABLET BY MOUTH DAILY.   diltiazem (CARDIZEM CD) 120 MG 24 hr capsule, take 1 capsule by mouth once daily   metoprolol tartrate (LOPRESSOR) 50 MG tablet, TAKE 1 TABLET BY MOUTH 2 TIMES DAILY   Current Outpatient Medications (Analgesics):    HYDROcodone-acetaminophen (NORCO) 5-325 MG tablet, Take 1 tablet by mouth every 4 (four) hours as needed for moderate pain or severe pain.   traMADol (ULTRAM) 50 MG tablet, Take 1 tablet (50 mg total) by mouth every 6 (six) hours as needed.  Current Outpatient Medications (Hematological):    cyanocobalamin (,VITAMIN B-12,) 1000 MCG/ML injection, Inject 70m into muscle once weekly for 4 weeks, then 194minjected into muscle once monthly there after for 5 months.  Current Outpatient Medications (Other):    amoxicillin (AMOXIL) 500 MG capsule, Take 4 capsules by mouth one hour prior to any dental procedures   blood glucose meter kit and supplies KIT, Dispense based on patient and insurance preference. Use up to four times daily as directed.   Cholecalciferol (VITAMIN D3) 50 MCG (2000 UT) capsule, Take 2,000 Units by  mouth daily.   gabapentin (NEURONTIN) 100 MG capsule, Take 2 capsules (200 mg total) by mouth at bedtime.   glucose blood test strip, Check blood sugars twice daily   Insulin Pen Needle 31G X 5 MM MISC, As directed   INV-pembrolizumab EA3191 (MK-3475) 100 MG/4ML SOLN, Inject into the vein.   Lancets (ONETOUCH ULTRASOFT) lancets, Check blood sugars twice daily   NEEDLE, DISP, 25 G (B-D DISP NEEDLE 25GX1") 25G X 1" MISC, To use w/ B12 injections  nystatin-triamcinolone ointment (MYCOLOG), Apply 1 application topically 2 (two) times daily.   SYRINGE-NEEDLE, DISP, 3 ML 23G X 1" 3 ML MISC, To inject B12   Reviewed prior external information including notes and imaging from  primary care provider As well as notes that were available from care everywhere and other healthcare systems.  Past medical history, social, surgical and family history all reviewed in electronic medical record.  Trujillo pertanent information unless stated regarding to the chief complaint.   Review of Systems:  Trujillo headache, visual changes, nausea, vomiting, diarrhea, constipation, dizziness, abdominal pain, skin rash, fevers, chills, night sweats, weight loss, swollen lymph nodes, body aches, joint swelling, chest pain, shortness of breath, mood changes. POSITIVE muscle aches  Objective  Blood pressure 128/78, pulse 82, height _0  (1.702 m), weight 187 lb (84.8 kg), SpO2 97 %.   General: Trujillo apparent distress alert and oriented x3 mood and affect normal, dressed appropriately.  HEENT: Pupils equal, extraocular movements intact  Respiratory: Patient's speak in full sentences and does not appear short of breath  Cardiovascular: Trujillo lower extremity edema, non tender, Trujillo erythema  Patient's right ankle does have some limited range of motion and crepitus noted.   Limited muscular skeletal ultrasound was performed and interpreted by Hulan Saas, M  Patient does have still narrowing of the lateral and medial ankle mortise noted.   Trujillo significant hypoechoic changes Elysia significant improvement from previous exam. Impression: Interval improvement of the ankle effusion   Impression and Recommendations:    The above documentation has been reviewed and is accurate and complete Lyndal Pulley, DO

## 2022-06-17 ENCOUNTER — Encounter: Payer: Self-pay | Admitting: Family Medicine

## 2022-06-17 ENCOUNTER — Ambulatory Visit: Payer: Self-pay

## 2022-06-17 ENCOUNTER — Ambulatory Visit (INDEPENDENT_AMBULATORY_CARE_PROVIDER_SITE_OTHER): Payer: PRIVATE HEALTH INSURANCE | Admitting: Family Medicine

## 2022-06-17 VITALS — BP 128/78 | HR 82 | Ht 67.0 in | Wt 187.0 lb

## 2022-06-17 DIAGNOSIS — M19071 Primary osteoarthritis, right ankle and foot: Secondary | ICD-10-CM | POA: Diagnosis not present

## 2022-06-17 DIAGNOSIS — M25571 Pain in right ankle and joints of right foot: Secondary | ICD-10-CM | POA: Diagnosis not present

## 2022-06-17 DIAGNOSIS — G8929 Other chronic pain: Secondary | ICD-10-CM

## 2022-06-17 NOTE — Assessment & Plan Note (Signed)
Significant arthritis of the ankle.  The patient is doing significantly better without very significant hypoechoic changes.  Do feel that the PRP has made excellent improvement.  Patient is no longer working as he undergoes a another round of chemotherapy of this bladder cancer.  Patient will continue to try to wear good shoes and bracing as needed.  He can follow-up with me as needed with his other medical problems.  Total time discussing with patient, reviewing imaging and reviewing his outside notes 33 minutes

## 2022-06-17 NOTE — Patient Instructions (Signed)
Good to see you   Follow up in  

## 2022-07-02 ENCOUNTER — Other Ambulatory Visit (HOSPITAL_COMMUNITY): Payer: Self-pay

## 2022-07-02 MED ORDER — INSULIN ASPART 100 UNIT/ML FLEXPEN
40.0000 [IU] | PEN_INJECTOR | SUBCUTANEOUS | 99 refills | Status: DC
  Filled 2022-07-02: qty 15, 30d supply, fill #0

## 2022-07-03 ENCOUNTER — Encounter: Payer: Self-pay | Admitting: Family Medicine

## 2022-07-03 ENCOUNTER — Other Ambulatory Visit (HOSPITAL_COMMUNITY): Payer: Self-pay

## 2022-07-03 MED ORDER — DILTIAZEM HCL ER COATED BEADS 120 MG PO CP24
120.0000 mg | ORAL_CAPSULE | Freq: Every day | ORAL | 1 refills | Status: DC
  Filled 2022-07-30: qty 90, 90d supply, fill #0

## 2022-07-03 MED ORDER — TECHLITE PEN NEEDLES 31G X 8 MM MISC: 0 refills | Status: DC

## 2022-07-03 MED ORDER — TRIAMCINOLONE ACETONIDE 0.1 % EX CREA: TOPICAL_CREAM | CUTANEOUS | 1 refills | Status: DC

## 2022-07-04 ENCOUNTER — Other Ambulatory Visit (HOSPITAL_COMMUNITY): Payer: Self-pay

## 2022-07-04 ENCOUNTER — Other Ambulatory Visit: Payer: Self-pay

## 2022-07-04 MED ORDER — GABAPENTIN 100 MG PO CAPS
200.0000 mg | ORAL_CAPSULE | Freq: Every day | ORAL | 0 refills | Status: DC
  Filled 2022-07-04: qty 60, 30d supply, fill #0

## 2022-07-05 ENCOUNTER — Other Ambulatory Visit (HOSPITAL_COMMUNITY): Payer: Self-pay

## 2022-07-05 ENCOUNTER — Other Ambulatory Visit: Payer: Self-pay | Admitting: Family Medicine

## 2022-07-12 ENCOUNTER — Other Ambulatory Visit (HOSPITAL_BASED_OUTPATIENT_CLINIC_OR_DEPARTMENT_OTHER): Payer: Self-pay

## 2022-07-12 MED ORDER — COMIRNATY 30 MCG/0.3ML IM SUSY
PREFILLED_SYRINGE | INTRAMUSCULAR | 0 refills | Status: DC
  Filled 2022-07-12: qty 0.3, 1d supply, fill #0

## 2022-07-18 ENCOUNTER — Encounter: Payer: Self-pay | Admitting: Family Medicine

## 2022-07-18 MED ORDER — METOPROLOL TARTRATE 50 MG PO TABS
50.0000 mg | ORAL_TABLET | Freq: Two times a day (BID) | ORAL | 1 refills | Status: DC
  Filled 2022-07-26 – 2023-01-15 (×2): qty 180, 90d supply, fill #0

## 2022-07-22 ENCOUNTER — Other Ambulatory Visit (HOSPITAL_COMMUNITY): Payer: Self-pay

## 2022-07-22 MED ORDER — NYSTATIN 100000 UNIT/ML MT SUSP
OROMUCOSAL | 1 refills | Status: DC
  Filled 2022-07-22: qty 720, 6d supply, fill #0

## 2022-07-23 ENCOUNTER — Other Ambulatory Visit (HOSPITAL_COMMUNITY): Payer: Self-pay

## 2022-07-24 ENCOUNTER — Other Ambulatory Visit (HOSPITAL_COMMUNITY): Payer: Self-pay

## 2022-07-26 ENCOUNTER — Other Ambulatory Visit (HOSPITAL_COMMUNITY): Payer: Self-pay

## 2022-07-26 MED FILL — Gabapentin Cap 100 MG: ORAL | 90 days supply | Qty: 180 | Fill #0 | Status: AC

## 2022-07-30 ENCOUNTER — Other Ambulatory Visit: Payer: Self-pay

## 2022-07-30 ENCOUNTER — Other Ambulatory Visit (HOSPITAL_COMMUNITY): Payer: Self-pay

## 2022-07-30 MED FILL — Atorvastatin Calcium Tab 40 MG (Base Equivalent): ORAL | 90 days supply | Qty: 90 | Fill #0 | Status: AC

## 2022-07-31 ENCOUNTER — Other Ambulatory Visit (HOSPITAL_COMMUNITY): Payer: Self-pay

## 2022-08-27 ENCOUNTER — Encounter: Payer: Self-pay | Admitting: Family Medicine

## 2022-08-27 ENCOUNTER — Ambulatory Visit (INDEPENDENT_AMBULATORY_CARE_PROVIDER_SITE_OTHER): Payer: Medicare Other | Admitting: Family Medicine

## 2022-08-27 VITALS — BP 138/88 | HR 70 | Temp 97.5°F | Resp 16 | Ht 67.0 in | Wt 193.4 lb

## 2022-08-27 DIAGNOSIS — I10 Essential (primary) hypertension: Secondary | ICD-10-CM

## 2022-08-27 DIAGNOSIS — C775 Secondary and unspecified malignant neoplasm of intrapelvic lymph nodes: Secondary | ICD-10-CM

## 2022-08-27 DIAGNOSIS — E785 Hyperlipidemia, unspecified: Secondary | ICD-10-CM

## 2022-08-27 DIAGNOSIS — E1165 Type 2 diabetes mellitus with hyperglycemia: Secondary | ICD-10-CM

## 2022-08-27 DIAGNOSIS — E1169 Type 2 diabetes mellitus with other specified complication: Secondary | ICD-10-CM | POA: Diagnosis not present

## 2022-08-27 DIAGNOSIS — C679 Malignant neoplasm of bladder, unspecified: Secondary | ICD-10-CM | POA: Diagnosis not present

## 2022-08-27 LAB — MICROALBUMIN / CREATININE URINE RATIO
Creatinine,U: 213.6 mg/dL
Microalb Creat Ratio: 0.9 mg/g (ref 0.0–30.0)
Microalb, Ur: 2 mg/dL — ABNORMAL HIGH (ref 0.0–1.9)

## 2022-08-27 LAB — LIPID PANEL
Cholesterol: 143 mg/dL (ref 0–200)
HDL: 60.9 mg/dL (ref >39.00)
LDL Cholesterol: 63 mg/dL (ref 0–99)
NonHDL: 82.38
Total CHOL/HDL Ratio: 2
Triglycerides: 99 mg/dL (ref 0.0–149.0)
VLDL: 19.8 mg/dL (ref 0.0–40.0)

## 2022-08-27 LAB — HEMOGLOBIN A1C: Hgb A1c MFr Bld: 6.6 % — ABNORMAL HIGH (ref 4.6–6.5)

## 2022-08-27 NOTE — Assessment & Plan Note (Signed)
hgba1c to be checked minimize simple carbs. Increase exercise as tolerated. Continue current meds 

## 2022-08-27 NOTE — Assessment & Plan Note (Signed)
Well controlled, no changes to meds. Encouraged heart healthy diet such as the DASH diet and exercise as tolerated.  

## 2022-08-27 NOTE — Progress Notes (Addendum)
Subjective:   By signing my name below, I, [name], attest that this documentation has been prepared under the direction and in the presence of [name], DO. 08/27/2022.    Patient ID: Charles Trujillo, male    DOB: [birth year], 72 y.o.   MRN: [id number]  Chief Complaint  Patient presents with   Diabetes   Follow-up    HPI Patient is in today for an office visit. He is f/u on dm, chol and bp. He has labs being done prior to chemo tx tomorrow. Pt overall is doing well-- no pain despite metastasis of the bladder cancer ----  records, imaging from [facility] reviewed in care everywhere. Pt does not want to know about prognosis. Pt states his sugars are running high but he is on prednisone prior to each treatment.     Denies having any fever, new muscle pain, joint pain, new moles, congestion, sinus pain, sore throat, chest pain, palpitations, cough, SOB, wheezing, n/v/d, constipation, blood in stool, dysuria, frequency, hematuria, at this time.  Past Medical History:  Diagnosis Date   Anxiety    Arthritis    bilateral knees   Depression    DM2 (diabetes mellitus, type 2) (HCC)    Dysrhythmia     benign tachycardia, takes Cartia   Elevated LFTs    previously evaluated by Dr Sharlett Iles; h/o "fatty liver"   GERD (gastroesophageal reflux disease)    HLD (hyperlipidemia)    Hypertension    Inguinal hernia    right   Squamous cell carcinoma    top of head    Past Surgical History:  Procedure Laterality Date   ACHILLES TENDON REPAIR  1999   BLADDER SURGERY     CARPAL TUNNEL RELEASE Right 11/04/2014   Procedure: LIMITED OPEN RIGHT CARPAL TUNNEL RELEASE;  Surgeon: Roseanne Kaufman, MD;  Location: [place];  Service: Orthopedics;  Laterality: Right;   CARPAL TUNNEL WITH CUBITAL TUNNEL Left 07/22/2014   Procedure: LEFT CARPAL TUNNEL RELEASE AND CUBITAL TUNNEL RELEASE TRANSPOSITION;  Surgeon: Roseanne Kaufman, MD;  Location: [place];   Service: Orthopedics;  Laterality: Left;   COLONOSCOPY     EYE SURGERY     laser eye surgery bilat    HERNIA REPAIR  07/2011   left side   INGUINAL HERNIA REPAIR  08/09/2011   Procedure: LAPAROSCOPIC INGUINAL HERNIA;  Surgeon: Joyice Faster. Cornett, MD;  Location[facility];  Service: General;  Laterality: Right;   KNEE ARTHROSCOPY  1993   left knee   KNEE SURGERY     age 16- right   SQUAMOUS CELL CARCINOMA EXCISION     left knee   TOTAL KNEE ARTHROPLASTY Left 05/04/2019   Procedure: LEFT TOTAL KNEE ARTHROPLASTY;  Surgeon: Garald Balding, MD;  [facility];  Service: Orthopedics;  Laterality: Left;   TOTAL KNEE ARTHROPLASTY Right 05/09/2020   Procedure: RIGHT TOTAL KNEE ARTHROPLASTY;  Surgeon: Garald Balding, MD;  [facility];  Service: Orthopedics;  Laterality: Right;    Family History  Problem Relation Age of Onset   Diabetes Mother    Hypertension Mother    Heart disease Mother    Heart failure Mother    Stroke Father 66   Parkinsonism Father    Depression Brother    Alcohol abuse Brother    Cancer Maternal Grandmother        bone   Colon cancer Neg Hx    Colon polyps Neg Hx  Esophageal cancer Neg Hx    Rectal cancer Neg Hx    Stomach cancer Neg Hx     Social History   Socioeconomic History   Marital status: Married    Spouse name: Not on file   Number of children: 1   Years of education: Not on file   Highest education level: Not on file  Occupational History   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, fast food: [employer]  Tobacco Use   Smoking status: Some Days    Types: Cigars   Smokeless tobacco: Never   Tobacco comments:    smokes one every other day  Vaping Use   Vaping Use: Never used  Substance and Sexual Activity   Alcohol use: Yes    Alcohol/week: 3.0 - 4.0 standard drinks of alcohol    Types: 3 - 4 Cans of beer per week    Comment: social   Drug use: No   Sexual activity: Yes    Partners:  Female  Other Topics Concern   Not on file  Social History Narrative   Married to [place]   exercise--- not regularly   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 90 tablet 0   blood glucose meter kit and supplies KIT Dispense based on patient and insurance preference. Use up to four times daily as directed. 1 each 0   Cholecalciferol (VITAMIN D3) 50 MCG (2000 UT) capsule Take 2,000 Units by mouth daily.     cyanocobalamin (VITAMIN B12) 1000 MCG/ML injection Inject 20m into muscle once weekly for 4 weeks, then 1102minjected into muscle once monthly there after for 5 months. 10 mL 0   diltiazem (CARDIZEM CD) 120 MG 24 hr capsule take 1 capsule by mouth once daily 90 capsule 1   diltiazem (CARDIZEM CD) 120 MG 24 hr capsule Take 1 capsule (120 mg total) by mouth daily. 90 capsule 1   gabapentin (NEURONTIN) 100 MG capsule Take 2 capsules (200 mg total) by mouth at bedtime. 180 capsule 0   glucose blood test strip Check blood sugars twice daily 200 each 11   HYDROcodone-acetaminophen (NORCO) 5-325 MG tablet Take 1 tablet by mouth every 4 (four) hours as needed for moderate pain or severe pain. 20 tablet 0   insulin aspart (NOVOLOG) 100 UNIT/ML FlexPen Inject 40 Units into the skin daily as directed per sliding scale 10 mL 99   insulin lispro (HUMALOG KWIKPEN) 100 UNIT/ML KwikPen Inject under the skin to max  of 40 units per sliding scale 15 mL PRN   Insulin Pen Needle (TECHLITE PEN NEEDLES) 31G X 8 MM MISC Use as directed 100 each 0   Insulin Pen Needle 31G X 5 MM MISC As directed 100 each 0   INV-pembrolizumab EA3191 (MK-3475) 100 MG/4ML SOLN Inject into the vein.  Lancets (ONETOUCH ULTRASOFT) lancets Check blood sugars twice daily 200 each 12   magic mouthwash (nystatin, diphenhydrAMINE, alum & mag hydroxide) suspension mixture Swish and spit 30 mls by mouth 4 times a day 720 mL 1   metoprolol tartrate (LOPRESSOR) 50 MG tablet TAKE 1 TABLET BY MOUTH 2 TIMES DAILY 180 tablet 1   NEEDLE, DISP, 25 G (B-D DISP NEEDLE 25GX1") 25G X 1" MISC To use w/ B12 injections 50 each 0   nystatin-triamcinolone ointment (MYCOLOG) Apply 1 application topically 2 (two) times daily. 30 g 1   predniSONE (DELTASONE) 20 MG tablet Take 4 tablets by mouth daily until instructed to taper by your oncologist 60 tablet 0   SitaGLIPtin-MetFORMIN HCl [phone] MG TB24 Take 1 tablet by mouth daily. 90 tablet 1   SYRINGE-NEEDLE, DISP, 3 ML 23G X 1" 3 ML MISC To inject B12 50 each 0   traMADol (ULTRAM) 50 MG tablet Take 1 tablet (50 mg total) by mouth every 6 (six) hours as needed. 20 tablet 0   triamcinolone cream (KENALOG) 0.1 % Apply thin layer to affected area 2 times a day as needed 454 g 1   COVID-19 mRNA vaccine 2023-2024 (COMIRNATY) syringe Inject into the muscle. (Patient not taking: Reported on 08/27/2022) 0.3 mL 0   No facility-administered medications prior to visit.    Allergies  Allergen Reactions   Meth-Hyo-M Bl-Na Phos-Ph Sal Other (See Comments)    [place] believes it may have contributed to renal failure accourding to his spouse who informed us at 17:17 01/19/2021   Codeine Nausea Only    Review of Systems  Constitutional:  Positive for malaise/fatigue. Negative for fever.  HENT:  Negative for congestion, sinus pain and sore throat.   Eyes:  Negative for blurred vision.  Respiratory:  Negative for cough,  shortness of breath and wheezing.   Cardiovascular:  Negative for chest pain, palpitations and leg swelling.  Gastrointestinal:  Negative for abdominal pain, blood in stool, constipation, diarrhea, nausea and vomiting.  Genitourinary:  Negative for dysuria, frequency and hematuria.  Musculoskeletal:  Negative for back pain, falls, joint pain and myalgias.  Skin:  Negative for rash.  Neurological:  Negative for dizziness, loss of consciousness and headaches.  Endo/Heme/Allergies:  Negative for environmental allergies.  Psychiatric/Behavioral:  Negative for depression. The patient is not nervous/anxious.        Objective:    Physical Exam Vitals and nursing note reviewed.  Constitutional:      General: He is not in acute distress.    Appearance: Normal appearance. He is not ill-appearing.  HENT:     Head: Normocephalic and atraumatic.     Right Ear: Tympanic membrane, ear canal and external ear normal.     Left Ear: Tympanic membrane, ear canal and external ear normal.  Eyes:     Extraocular Movements: Extraocular movements intact.     Pupils: Pupils are equal, round, and reactive to light.  Cardiovascular:     Rate and Rhythm: Normal rate and regular rhythm.     Heart sounds: Normal heart sounds. No murmur heard.    No gallop.  Pulmonary:     Effort: Pulmonary effort is normal. No respiratory distress.     Breath sounds: Normal breath sounds. No wheezing or rales.  Skin:    General: Skin is warm and dry.  Neurological:     General: No focal deficit present.     Mental Status: He is alert and oriented to person, place, and time.  Psychiatric:        Mood and Affect: Mood normal.        Behavior: Behavior normal.     BP 138/88 (BP Location: Left Arm, Patient Position: Sitting, Cuff Size: Normal)   Pulse 70   Temp (!) 97.5 F (36.4 C) (Oral)   Resp 16   Ht 5' 7"$  (1.702 m)   Wt 193 lb 6.4 oz (87.7 kg)   SpO2 97%   BMI 30.29 kg/m  Wt Readings from Last 3 Encounters:   08/27/22 193 lb 6.4 oz (87.7 kg)  06/17/22 187 lb (84.8 kg)  04/11/22 192 lb (87.1 kg)    Diabetic Foot Exam - Simple   No data filed    Lab Results  Component Value Date   WBC 10.8 (H) 07/04/2021   HGB 16.2 07/04/2021   HCT 49.0 07/04/2021   PLT 246 07/04/2021   GLUCOSE 124 (H) 07/04/2021   CHOL 143 01/18/2022   TRIG 120 01/18/2022   HDL 64 01/18/2022   LDLDIRECT 143.5 02/01/2011   LDLCALC 58 01/18/2022   ALT 42 07/04/2021   AST 37 07/04/2021   NA 136 07/04/2021   K 3.8 07/04/2021   CL 106 07/04/2021   CREATININE 0.94 07/04/2021   BUN 12 07/04/2021   CO2 24 07/04/2021   TSH 1.94 05/29/2021   PSA 0.19 07/10/2021   INR 1.2 05/01/2020   HGBA1C 7.3 (H) 01/18/2022   MICROALBUR 29.4 (H) 08/31/2020    Lab Results  Component Value Date   TSH 1.94 05/29/2021   Lab Results  Component Value Date   WBC 10.8 (H) 07/04/2021   HGB 16.2 07/04/2021   HCT 49.0 07/04/2021   MCV 88.8 07/04/2021   PLT 246 07/04/2021   Lab Results  Component Value Date   NA 136 07/04/2021   K 3.8 07/04/2021   CO2 24 07/04/2021   GLUCOSE 124 (H) 07/04/2021   BUN 12 07/04/2021   CREATININE 0.94 07/04/2021   BILITOT 0.6 07/04/2021   ALKPHOS 58 07/04/2021   AST 37 07/04/2021   ALT 42 07/04/2021   PROT 7.3 07/04/2021   ALBUMIN 4.0 07/04/2021   CALCIUM 8.5 (L) 07/04/2021   ANIONGAP 6 07/04/2021   GFR 69.62 05/29/2021   Lab Results  Component Value Date   CHOL 143 01/18/2022   Lab Results  Component Value Date   HDL 64 01/18/2022   Lab Results  Component Value Date   LDLCALC 58 01/18/2022   Lab Results  Component Value Date   TRIG 120 01/18/2022   Lab Results  Component Value Date   CHOLHDL 2.2 01/18/2022   Lab Results  Component Value Date   HGBA1C 7.3 (H) 01/18/2022       Assessment & Plan:   Problem List Items Addressed This Visit       Unprioritized   Type 2 diabetes mellitus with hyperglycemia, without long-term current use of insulin ([place]) - Primary     hgba1c to be checked  minimize simple carbs. Increase exercise as tolerated. Continue current meds       Relevant Orders   Hemoglobin A1c   Lipid panel   Microalbumin / creatinine urine ratio   Hyperlipidemia associated with type 2 diabetes mellitus (HCC)    Encourage heart healthy diet such as MIND or DASH diet, increase exercise, avoid trans fats, simple carbohydrates and processed foods, consider a krill or fish or flaxseed oil cap daily.        Relevant Orders   Hemoglobin  A1c   Lipid panel   Microalbumin / creatinine urine ratio   Essential hypertension    Well controlled, no changes to meds. Encouraged heart healthy diet such as the DASH diet and exercise as tolerated.        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   IAnn Held, DO, personally preformed the services described in this documentation.  All medical record entries made by the scribe were at my direction and in my presence.  I have reviewed the chart and discharge instructions (if applicable) and agree that the record reflects my personal performance and is accurate and complete. 08/27/2022.  I,Mathew Stumpf,acting as a Education administrator for [employer], DO.,have documented all relevant documentation on the behalf of [name], DO,as directed by  [name], DO while in the presence of [name], DO.   [name], DO

## 2022-08-27 NOTE — Patient Instructions (Signed)

## 2022-08-27 NOTE — Assessment & Plan Note (Signed)
Encourage heart healthy diet such as MIND or DASH diet, increase exercise, avoid trans fats, simple carbohydrates and processed foods, consider a krill or fish or flaxseed oil cap daily.  °

## 2022-08-27 NOTE — Assessment & Plan Note (Signed)
Per oncology at [place]

## 2022-08-28 ENCOUNTER — Encounter: Payer: Self-pay | Admitting: Family Medicine

## 2022-08-29 ENCOUNTER — Other Ambulatory Visit: Payer: Self-pay | Admitting: Family Medicine

## 2022-08-29 ENCOUNTER — Other Ambulatory Visit (HOSPITAL_COMMUNITY): Payer: Self-pay

## 2022-08-29 ENCOUNTER — Encounter: Payer: Self-pay | Admitting: Family Medicine

## 2022-08-29 DIAGNOSIS — F418 Other specified anxiety disorders: Secondary | ICD-10-CM

## 2022-08-29 MED ORDER — ONDANSETRON HCL 8 MG PO TABS
8.0000 mg | ORAL_TABLET | Freq: Three times a day (TID) | ORAL | 3 refills | Status: DC | PRN
  Filled 2022-08-29 (×2): qty 60, 20d supply, fill #0

## 2022-08-29 MED ORDER — DULOXETINE HCL 30 MG PO CPEP: 30.0000 mg | ORAL_CAPSULE | Freq: Every day | ORAL | 3 refills | Status: DC

## 2022-08-30 ENCOUNTER — Other Ambulatory Visit (HOSPITAL_COMMUNITY): Payer: Self-pay

## 2022-09-02 ENCOUNTER — Other Ambulatory Visit (HOSPITAL_COMMUNITY): Payer: Self-pay

## 2022-09-02 MED ORDER — DULOXETINE HCL 30 MG PO CPEP
30.0000 mg | ORAL_CAPSULE | Freq: Every day | ORAL | 3 refills | Status: DC
  Filled 2022-09-02 – 2022-09-04 (×2): qty 30, 30d supply, fill #0

## 2022-09-03 ENCOUNTER — Other Ambulatory Visit (HOSPITAL_COMMUNITY): Payer: Self-pay

## 2022-09-04 ENCOUNTER — Other Ambulatory Visit: Payer: Self-pay

## 2022-09-04 ENCOUNTER — Other Ambulatory Visit (HOSPITAL_COMMUNITY): Payer: Self-pay

## 2022-09-05 ENCOUNTER — Other Ambulatory Visit: Payer: Self-pay

## 2022-09-13 ENCOUNTER — Other Ambulatory Visit: Payer: Self-pay | Admitting: Family Medicine

## 2022-09-13 ENCOUNTER — Encounter: Payer: Self-pay | Admitting: Family Medicine

## 2022-09-13 MED ORDER — DULOXETINE HCL 20 MG PO CPEP: 20.0000 mg | ORAL_CAPSULE | Freq: Every day | ORAL | 3 refills | Status: DC

## 2022-09-20 ENCOUNTER — Other Ambulatory Visit: Payer: Self-pay

## 2022-09-20 ENCOUNTER — Other Ambulatory Visit: Payer: Self-pay | Admitting: Family Medicine

## 2022-09-20 ENCOUNTER — Other Ambulatory Visit (HOSPITAL_COMMUNITY): Payer: Self-pay

## 2022-09-20 ENCOUNTER — Encounter: Payer: Self-pay | Admitting: Family Medicine

## 2022-09-20 ENCOUNTER — Encounter: Payer: Self-pay | Admitting: Pharmacist

## 2022-09-20 DIAGNOSIS — E1165 Type 2 diabetes mellitus with hyperglycemia: Secondary | ICD-10-CM

## 2022-09-20 MED ORDER — SITAGLIP PHOS-METFORMIN HCL ER 100-1000 MG PO TB24
1.0000 | ORAL_TABLET | Freq: Every day | ORAL | 1 refills | Status: DC
  Filled 2022-09-20 – 2022-09-26 (×5): qty 90, 90d supply, fill #0
  Filled 2022-12-28: qty 90, 90d supply, fill #1

## 2022-09-20 MED ORDER — METFORMIN HCL 1000 MG PO TABS
1000.0000 mg | ORAL_TABLET | Freq: Two times a day (BID) | ORAL | 3 refills | Status: DC
  Filled 2022-09-20: qty 180, 90d supply, fill #0

## 2022-09-20 MED ORDER — EMPAGLIFLOZIN 10 MG PO TABS
10.0000 mg | ORAL_TABLET | Freq: Every day | ORAL | 2 refills | Status: DC
  Filled 2022-09-20: qty 30, 30d supply, fill #0

## 2022-09-20 NOTE — Telephone Encounter (Signed)
Please advise in regard to [place].

## 2022-09-21 ENCOUNTER — Other Ambulatory Visit (HOSPITAL_COMMUNITY): Payer: Self-pay

## 2022-09-23 ENCOUNTER — Other Ambulatory Visit: Payer: Self-pay

## 2022-09-23 ENCOUNTER — Other Ambulatory Visit (HOSPITAL_COMMUNITY): Payer: Self-pay

## 2022-09-24 ENCOUNTER — Other Ambulatory Visit: Payer: Self-pay | Admitting: Family Medicine

## 2022-09-24 DIAGNOSIS — E1165 Type 2 diabetes mellitus with hyperglycemia: Secondary | ICD-10-CM

## 2022-09-26 ENCOUNTER — Other Ambulatory Visit: Payer: Self-pay | Admitting: Family Medicine

## 2022-09-26 ENCOUNTER — Encounter: Payer: Self-pay | Admitting: Pharmacist

## 2022-09-26 ENCOUNTER — Other Ambulatory Visit (HOSPITAL_COMMUNITY): Payer: Self-pay

## 2022-09-26 ENCOUNTER — Encounter (HOSPITAL_COMMUNITY): Payer: Self-pay

## 2022-09-26 ENCOUNTER — Telehealth: Payer: Self-pay | Admitting: Pharmacist

## 2022-09-26 ENCOUNTER — Other Ambulatory Visit: Payer: BLUE CROSS/BLUE SHIELD | Admitting: Pharmacist

## 2022-09-26 DIAGNOSIS — E1165 Type 2 diabetes mellitus with hyperglycemia: Secondary | ICD-10-CM

## 2022-09-26 MED ORDER — DAPAGLIFLOZIN PROPANEDIOL 10 MG PO TABS
10.0000 mg | ORAL_TABLET | Freq: Every day | ORAL | 0 refills | Status: DC
  Filled 2022-09-26: qty 90, 90d supply, fill #0
  Filled 2022-09-26: qty 30, 30d supply, fill #0

## 2022-09-26 MED ORDER — SITAGLIPTIN PHOSPHATE 100 MG PO TABS
100.0000 mg | ORAL_TABLET | Freq: Every day | ORAL | 1 refills | Status: DC
  Filled 2022-09-26: qty 90, 90d supply, fill #0

## 2022-09-26 NOTE — Progress Notes (Signed)
09/26/2022 Name: Charles Trujillo MRN: [id number] DOB: 12/17/1950  No chief complaint on file.   Charles Trujillo is a 72 y.o. year old male who presented for a telephone visit.   They were referred to the pharmacist by their PCP for assistance in managing medication access.   Subjective:   Medication Access/Adherence  Current Pharmacy:  [facility] [place] [address] Phone: [phone] Fax: [phone]   Patient reports affordability concerns with their medications: Yes  Patient reports access/transportation concerns to their pharmacy: No  Patient reports adherence concerns with their medications:  No      Diabetes:  Patient was taking Janumet 100/1000 ER daily with good results. However the last time he requested a refill cost was over $300. Provider sent in [place] but it too was over $300. Provider forward request to assist with medication cost.   In 2024 patient retired from [employer] and insurance changed to Medicare with [employer].    Objective:  Lab Results  Component Value Date   HGBA1C 6.6 (H) 08/27/2022    Lab Results  Component Value Date   CREATININE 0.94 07/04/2021   BUN 12 07/04/2021   NA 136 07/04/2021   K 3.8 07/04/2021   CL 106 07/04/2021   CO2 24 07/04/2021    Lab Results  Component Value Date   CHOL 143 08/27/2022   HDL 60.90 08/27/2022   LDLCALC 63 08/27/2022   LDLDIRECT 143.5 02/01/2011   TRIG 99.0 08/27/2022   CHOLHDL 2 08/27/2022    Medications Reviewed Today     Reviewed by Cherre Robins, RPH-CPP (Pharmacist) on 09/26/22 at 35  Med List Status: <None>   Medication Order Taking? Sig Documenting Provider Last Dose Status Informant  amoxicillin (AMOXIL) 500 MG capsule [id number]  Take 4 capsules by mouth one hour prior to any dental procedures Garald Balding, MD  Active   atorvastatin (LIPITOR) 40 MG  tablet [id number]  TAKE 1 TABLET BY MOUTH DAILY. Roma Schanz R, DO  Active   blood glucose meter kit and supplies KIT [id number]  Dispense based on patient and insurance preference. Use up to four times daily as directed. [name], DO  Active   Cholecalciferol (VITAMIN D3) 50 MCG (2000 UT) capsule [id number]  Take 2,000 Units by mouth daily. [provider]  Active Spouse/Significant Other  cyanocobalamin (VITAMIN B12) 1000 MCG/ML injection [id number]  Inject 18m into muscle once weekly for 4 weeks, then 149minjected into muscle once monthly there after for 5 months. Saguier, EdPercell MillerPA-C  Active   diltiazem (CARDIZEM CD) 120 MG 24 hr capsule [id number] 1 capsule (120 mg total) by mouth daily.   Active   DULoxetine (CYMBALTA) 20 MG capsule [id number] 1 capsule (20 mg total) by mouth daily. LoCarollee HerterYvAlferd ApaDO  Active   empagliflozin (JARDIANCE) 10 MG TABS tablet [id number] Take 1 tablet (10 mg total) by mouth daily before breakfast.  Patient not taking: Reported on 09/26/2022   LoAnn HeldDO Not Taking Active   gabapentin (NEURONTIN) 100 MG capsule [id number] 2 capsules (200 mg total) by mouth at bedtime. SmLyndal PulleyDO  Active   glucose blood test strip [id number] blood sugars twice daily LoCarollee HerterYvAlferd ApaDO  Active   HYDROcodone-acetaminophen ([facility]5-325 MG tablet [id number] 1 tablet  by mouth every 4 (four) hours as needed for moderate pain or severe pain. Daleen Bo, MD  Active   Insulin Pen Needle 31G X 5 MM MISC [id number]  As directed [name], DO  Active   INV-pembrolizumab [id number] [phone]) 100 MG/4ML SOLN [id number]  Inject into the vein. [provider]  Active   Lancets [facility] ULTRASOFT) lancets [id number]  Check blood sugars twice daily Carollee Herter, Yvonne R, DO  Active   metFORMIN (GLUCOPHAGE) 1000 MG tablet [id number]  Take 1 tablet (1,000 mg total) by mouth 2 (two) times daily  with a meal. Carollee Herter, Alferd Apa, DO  Active   metoprolol tartrate (LOPRESSOR) 50 MG tablet [id number] Yes TAKE 1 TABLET BY MOUTH 2 TIMES DAILY Roma Schanz R, DO Taking Active   NEEDLE, DISP, 25 G (B-D DISP NEEDLE 25GX1") 25G X 1" MISC [id number]  To use w/ B12 injections Saguier, Iris Pert  Active   nystatin-triamcinolone ointment [facility]) [id number]  Apply 1 application topically 2 (two) times daily. Gatha Mayer, MD  Active Self  ondansetron [facility]) 8 MG tablet [id number]  Take 1 tablet (8 mg total) by mouth every 8 (eight) hours as needed for nausea.   Active   predniSONE (DELTASONE) 20 MG tablet [id number]  Take 4 tablets by mouth daily until instructed to taper by your oncologist   Active   SitaGLIPtin-MetFORMIN HCl [phone] MG TB24 [id number] No Take 1 tablet by mouth daily.  Patient not taking: Reported on 09/26/2022   [name], DO Not Taking Active   SYRINGE-NEEDLE, DISP, 3 ML 23G X 1" 3 ML MISC [id number]  To inject B12 Saguier, Iris Pert  Active   traMADol (ULTRAM) 50 MG tablet [id number]  Take 1 tablet (50 mg total) by mouth every 6 (six) hours as needed. Hulan Saas M, DO  Active   triamcinolone cream (KENALOG) 0.1 % [id number]  Apply thin layer to affected area 2 times a day as needed   Active               Assessment/Plan:   Diabetes:Currently controlled with Janumet but cost was high for first fill in 2023.  - Reviewed patient's 2024 Part D Plan with [employer] (Plus plan [phone])  Patient's plan has $200 deductible to meet for tier 3 or 4 medications Januvia, Janumet, Jardiance, Farxiga and generic Wilder Glade are all tier 3 For initial fill cost will be $328 for 90 days or $233 for 30 days. Provided patient with cost information and he will contact [facility] to let them know if he prefers 90 or 30 days supply. After initial fill 30 days will be $47 and 90 days will be $141.  Reviewed patients meds on Medicare.gov - expect he will reach  Medicare coverage gap in December 2024. Cost of Januvia will then be about $141 for 30 days. Benefits will start over in January 2025.  - Assessed for medication assistance program but household income did not meet medication assistance program criteria for 2024 for Januvia, Jardiance or Farxiga.  - Continue metformin '1000mg'$  twice a day - add Januvia '100mg'$  once daily - Recommend to check glucose once a day - contact office if blood glucose > 200 or < 70    Cherre Robins, PharmD Clinical Pharmacist [place] [facility]

## 2022-09-26 NOTE — Telephone Encounter (Signed)
Patient reports high cost for Janumet, Januvia or Jardiance. It looks like he has [employer] Part D plan but I cannot determine exactly which plan until I have further information from patient.  He does have deductible to meet but his specific plan will determine if it is $200, $280 or $545.  Unable to reach patient by LM on VM with my CB# [phone] or [phone]

## 2022-09-27 ENCOUNTER — Other Ambulatory Visit: Payer: Self-pay

## 2022-10-12 ENCOUNTER — Encounter: Payer: Self-pay | Admitting: Family Medicine

## 2022-10-25 ENCOUNTER — Other Ambulatory Visit: Payer: Self-pay | Admitting: Family Medicine

## 2022-10-25 ENCOUNTER — Other Ambulatory Visit: Payer: Self-pay

## 2022-10-25 ENCOUNTER — Other Ambulatory Visit (HOSPITAL_COMMUNITY): Payer: Self-pay

## 2022-10-25 MED ORDER — GABAPENTIN 100 MG PO CAPS
200.0000 mg | ORAL_CAPSULE | Freq: Every day | ORAL | 0 refills | Status: DC
  Filled 2022-10-25: qty 180, 90d supply, fill #0

## 2022-11-10 ENCOUNTER — Other Ambulatory Visit: Payer: Self-pay | Admitting: Family Medicine

## 2022-11-10 ENCOUNTER — Other Ambulatory Visit (HOSPITAL_COMMUNITY): Payer: Self-pay

## 2022-11-11 ENCOUNTER — Other Ambulatory Visit (HOSPITAL_COMMUNITY): Payer: Self-pay

## 2022-11-11 ENCOUNTER — Other Ambulatory Visit: Payer: Self-pay

## 2022-11-11 MED ORDER — ATORVASTATIN CALCIUM 40 MG PO TABS
40.0000 mg | ORAL_TABLET | Freq: Every day | ORAL | 1 refills | Status: DC
  Filled 2022-11-11: qty 90, 90d supply, fill #0

## 2022-11-11 MED FILL — Diltiazem HCl Coated Beads Cap ER 24HR 120 MG: ORAL | 90 days supply | Qty: 90 | Fill #0 | Status: AC

## 2022-11-20 ENCOUNTER — Other Ambulatory Visit (HOSPITAL_COMMUNITY): Payer: Self-pay

## 2022-12-03 ENCOUNTER — Encounter: Payer: Self-pay | Admitting: Family Medicine

## 2022-12-03 ENCOUNTER — Other Ambulatory Visit (HOSPITAL_COMMUNITY): Payer: Self-pay

## 2022-12-03 MED ORDER — AMOXICILLIN 500 MG PO CAPS
ORAL_CAPSULE | ORAL | 0 refills | Status: DC
  Filled 2022-12-03: qty 12, 3d supply, fill #0

## 2022-12-04 ENCOUNTER — Other Ambulatory Visit: Payer: Self-pay

## 2022-12-10 ENCOUNTER — Encounter: Payer: Self-pay | Admitting: Family Medicine

## 2022-12-11 ENCOUNTER — Other Ambulatory Visit: Payer: Self-pay

## 2022-12-16 NOTE — Progress Notes (Unsigned)
[facility] [address] [place] [address] Phone: [phone] Subjective:   Charles Trujillo, am serving as a scribe for Dr. Antoine Primas.  I'm seeing this patient by the request  of:  Donato Schultz, DO  CC: ankle arthritis follow up   [id number]  06/18/2023 Significant arthritis of the ankle. The patient is doing significantly better without very significant hypoechoic changes. Do feel that the PRP has made excellent improvement. Patient is no longer working as he undergoes a another round of chemotherapy of this bladder cancer. Patient will continue to try to wear good shoes and bracing as needed. He can follow-up with me as needed with his other medical problems. Total time discussing with patient, reviewing imaging and reviewing his outside notes 33 minutes   Updated 12/17/2022 Charles Trujillo is a 72 y.o. male coming in with complaint of ankle pain. Here for PRP. Pain worse after being on his feet.    Patient is still undergoing treatment for bladder cancer and unfortunately last CT abdomen and pelvis shows no significant response to the immunotherapy.    Past Medical History:  Diagnosis Date   Anxiety    Arthritis    bilateral knees   Depression    DM2 (diabetes mellitus, type 2) (HCC)    Dysrhythmia     benign tachycardia, takes Cartia   Elevated LFTs    previously evaluated by Dr Jarold Motto; h/o "fatty liver"   GERD (gastroesophageal reflux disease)    HLD (hyperlipidemia)    Hypertension    Inguinal hernia    right   Squamous cell carcinoma    top of head   Past Surgical History:  Procedure Laterality Date   ACHILLES TENDON REPAIR  1999   BLADDER SURGERY     CARPAL TUNNEL RELEASE Right 11/04/2014   Procedure: LIMITED OPEN RIGHT CARPAL TUNNEL RELEASE;  Surgeon: Dominica Severin, MD;  Location: [facility];  Service: Orthopedics;  Laterality: Right;   CARPAL TUNNEL WITH CUBITAL TUNNEL Left 07/22/2014    Procedure: LEFT CARPAL TUNNEL RELEASE AND CUBITAL TUNNEL RELEASE TRANSPOSITION;  Surgeon: Dominica Severin, MD;  Location:  SURGERY CENTER;  Service: Orthopedics;  Laterality: Left;   COLONOSCOPY     EYE SURGERY     laser eye surgery bilat    HERNIA REPAIR  07/2011   left side   INGUINAL HERNIA REPAIR  08/09/2011   Procedure: LAPAROSCOPIC INGUINAL HERNIA;  Surgeon: Clovis Pu. Cornett, MD;  Location: [facility];  Service: General;  Laterality: Right;   KNEE ARTHROSCOPY  1993   left knee   KNEE SURGERY     age 29- right   SQUAMOUS CELL CARCINOMA EXCISION     left knee   TOTAL KNEE ARTHROPLASTY Left 05/04/2019   Procedure: LEFT TOTAL KNEE ARTHROPLASTY;  Surgeon: Valeria Batman, MD;  Location: [facility];  Service: Orthopedics;  Laterality: Left;   TOTAL KNEE ARTHROPLASTY Right 05/09/2020   Procedure: RIGHT TOTAL KNEE ARTHROPLASTY;  Surgeon: Valeria Batman, MD;  Location: [facility];  Service: Orthopedics;  Laterality: Right;   Social History   Socioeconomic History   Marital status: Married    Spouse name: Not on file   Number of children: 1   Years of education: Not on file   Highest education level: Not on file  Occupational History   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    Employer: [employer]  Tobacco Use   Smoking  status: Some Days    Types: Cigars   Smokeless tobacco: Never   Tobacco comments:    smokes one every other day  Vaping Use   Vaping Use: Never used  Substance and Sexual Activity   Alcohol use: Yes    Alcohol/week: 3.0 - 4.0 standard drinks of alcohol    Types: 3 - 4 Cans of beer per week    Comment: social   Drug use: No   Sexual activity: Yes    Partners: Female  Other Topics Concern   Not on file  Social History Narrative   Married to [place]   exercise--- not regularly   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   Occasional smoker   Social  Determinants of Corporate investment banker Strain: Not on file  Food Insecurity: Not on file  Transportation Needs: Not on file  Physical Activity: Not on file  Stress: Not on file  Social Connections: Not on file   Allergies  Allergen Reactions   Meth-Hyo-M Bl-Na Phos-Ph Sal Other (See Comments)    [facility] believes it may have contributed to renal failure accourding to his spouse who informed us at 17:17 01/19/2021   Codeine Nausea Only   Family History  Problem Relation Age of Onset   Diabetes Mother    Hypertension Mother    Heart disease Mother    Heart failure Mother    Stroke Father 35   Parkinsonism Father    Depression Brother    Alcohol abuse Brother    Cancer Maternal Grandmother        bone   Colon cancer Neg Hx    Colon polyps Neg Hx    Esophageal cancer Neg Hx    Rectal cancer Neg Hx    Stomach cancer Neg Hx     Current Outpatient Medications (Endocrine & Metabolic):    metFORMIN (GLUCOPHAGE) 1000 MG tablet, Take 1 tablet (1,000 mg total) by mouth 2 (two) times daily with a meal.   predniSONE (DELTASONE) 20 MG tablet, Take 4 tablets by mouth daily until instructed to taper by your oncologist   SitaGLIPtin-MetFORMIN HCl [phone] MG TB24, Take 1 tablet by mouth daily.   sitaGLIPtin (JANUVIA) 100 MG tablet, Take 1 tablet (100 mg total) by mouth daily.  Current Outpatient Medications (Cardiovascular):    atorvastatin (LIPITOR) 40 MG tablet, Take 1 tablet (40 mg total) by mouth daily.   diltiazem (CARDIZEM CD) 120 MG 24 hr capsule, Take 1 capsule (120 mg total) by mouth daily.   metoprolol tartrate (LOPRESSOR) 50 MG tablet, TAKE 1 TABLET BY MOUTH 2 TIMES DAILY   Current Outpatient Medications (Analgesics):    HYDROcodone-acetaminophen (NORCO) 5-325 MG tablet, Take 1 tablet by mouth every 4 (four) hours as needed for moderate pain or severe pain.   traMADol (ULTRAM) 50 MG tablet, Take 1 tablet (50 mg total) by mouth every 6 (six) hours as needed.  Current  Outpatient Medications (Hematological):    cyanocobalamin (VITAMIN B12) 1000 MCG/ML injection, Inject 1mL into muscle once weekly for 4 weeks, then 1mL injected into muscle once monthly there after for 5 months.  Current Outpatient Medications (Other):    amoxicillin (AMOXIL) 500 MG capsule, Take 4 capsules by mouth 1 hour prior to dental procedures   blood glucose meter kit and supplies KIT, Dispense based on patient and insurance preference. Use up to four times daily as directed.   Cholecalciferol (VITAMIN D3) 50 MCG (  2000 UT) capsule, Take 2,000 Units by mouth daily.   DULoxetine (CYMBALTA) 20 MG capsule, Take 1 capsule (20 mg total) by mouth daily.   gabapentin (NEURONTIN) 100 MG capsule, Take 2 capsules (200 mg total) by mouth at bedtime.   glucose blood test strip, Check blood sugars twice daily   Insulin Pen Needle 31G X 5 MM MISC, As directed   INV-pembrolizumab [id number] ([id number]) 100 MG/4ML SOLN, Inject into the vein.   Lancets (ONETOUCH ULTRASOFT) lancets, Check blood sugars twice daily   NEEDLE, DISP, 25 G (B-D DISP NEEDLE 25GX1") 25G X 1" MISC, To use w/ B12 injections   nystatin-triamcinolone ointment (MYCOLOG), Apply 1 application topically 2 (two) times daily.   ondansetron (ZOFRAN) 8 MG tablet, Take 1 tablet (8 mg total) by mouth every 8 (eight) hours as needed for nausea.   SYRINGE-NEEDLE, DISP, 3 ML 23G X 1" 3 ML MISC, To inject B12   triamcinolone cream (KENALOG) 0.1 %, Apply thin layer to affected area 2 times a day as needed     Objective  Blood pressure 132/80, pulse 100, height 5\' 7"  (1.702 m), weight 193 lb (87.5 kg), SpO2 98 %.   General: No apparent distress alert and oriented x3 mood and affect normal, dressed appropriately.  Antalgic gait noted  Procedure: Real-time Ultrasound Guided Injection of right ankle Device: GE Logiq Q7 Ultrasound guided injection is preferred based studies that show increased duration, increased effect, greater accuracy, decreased  procedural pain, increased response rate, and decreased cost with ultrasound guided versus blind injection.  Verbal informed consent obtained.  Time-out conducted.  Noted no overlying erythema, induration, or other signs of local infection.  Skin prepped in a sterile fashion.  Local anesthesia: Topical Ethyl chloride.  With sterile technique and under real time ultrasound guidance: With a 21-gauge 2 inch needle injected into the anterior lateral aspect of the ankle mortise with a total of 0.5 cc of 0.5% Marcaine and then injected with 4 cc of PRP. Completed without difficulty  Advised to call if fevers/chills, erythema, induration, drainage, or persistent bleeding.  Impression: Technically successful ultrasound guided injection.    Impression and Recommendations:    The above documentation has been reviewed and is accurate and complete Judi Saa, DO

## 2022-12-17 ENCOUNTER — Other Ambulatory Visit: Payer: Self-pay

## 2022-12-17 ENCOUNTER — Ambulatory Visit (INDEPENDENT_AMBULATORY_CARE_PROVIDER_SITE_OTHER): Payer: Self-pay | Admitting: Family Medicine

## 2022-12-17 VITALS — BP 132/80 | HR 100 | Ht 67.0 in | Wt 193.0 lb

## 2022-12-17 DIAGNOSIS — G8929 Other chronic pain: Secondary | ICD-10-CM

## 2022-12-17 DIAGNOSIS — M25571 Pain in right ankle and joints of right foot: Secondary | ICD-10-CM

## 2022-12-17 DIAGNOSIS — M19071 Primary osteoarthritis, right ankle and foot: Secondary | ICD-10-CM

## 2022-12-17 NOTE — Patient Instructions (Signed)
No ice or IBU for 3 days Heat and Tylenol are ok See me again in 6 weeks 

## 2022-12-17 NOTE — Assessment & Plan Note (Signed)
Repeat PRP done again today.  Responded well to the injection.  Discussed PRP and post PRP protocol.  Discussed icing regimen and home exercises long-term, discussed which activities to do and which ones to avoid.  Increase activity slowly.  Follow-up again in 6 to 8 weeks

## 2022-12-28 ENCOUNTER — Encounter (HOSPITAL_COMMUNITY): Payer: Self-pay

## 2022-12-28 ENCOUNTER — Other Ambulatory Visit (HOSPITAL_COMMUNITY): Payer: Self-pay

## 2022-12-31 ENCOUNTER — Other Ambulatory Visit (HOSPITAL_COMMUNITY): Payer: Self-pay

## 2023-01-01 ENCOUNTER — Other Ambulatory Visit (HOSPITAL_COMMUNITY): Payer: Self-pay

## 2023-01-01 ENCOUNTER — Encounter: Payer: Self-pay | Admitting: Pharmacist

## 2023-01-01 ENCOUNTER — Other Ambulatory Visit: Payer: Self-pay

## 2023-01-15 ENCOUNTER — Other Ambulatory Visit (HOSPITAL_COMMUNITY): Payer: Self-pay

## 2023-01-15 ENCOUNTER — Other Ambulatory Visit: Payer: Self-pay

## 2023-01-24 ENCOUNTER — Other Ambulatory Visit: Payer: Self-pay

## 2023-01-24 ENCOUNTER — Other Ambulatory Visit: Payer: Self-pay | Admitting: Family Medicine

## 2023-01-24 MED ORDER — GABAPENTIN 100 MG PO CAPS
200.0000 mg | ORAL_CAPSULE | Freq: Every day | ORAL | 0 refills | Status: DC
  Filled 2023-01-24: qty 180, 90d supply, fill #0

## 2023-01-24 NOTE — Progress Notes (Signed)
[facility] [address] [place] [address] Phone: [phone] Subjective:   Charles Trujillo, am serving as a scribe for Dr. Antoine Trujillo.  I'm seeing this patient by the request  of:  Charles Schultz, DO  CC:   [id number]  12/17/2022 Repeat PRP done again today. Responded well to the injection. Discussed PRP and post PRP protocol. Discussed icing regimen and home exercises long-term, discussed which activities to do and which ones to avoid. Increase activity slowly. Follow-up again in 6 to 8 weeks   Updated 01/30/2023 Charles Trujillo is a 72 y.o. male coming in with complaint of R ankle pain. PRP follow up. Patient states that he is doing a lot better.  Doing much better with this but is having more low back pain.  Patient states that it is fairly severe.  Sometimes can wake him up at night.  No radiation down the legs.        Past Medical History:  Diagnosis Date   Anxiety    Arthritis    bilateral knees   Depression    DM2 (diabetes mellitus, type 2) (HCC)    Dysrhythmia     benign tachycardia, takes Cartia   Elevated LFTs    previously evaluated by Dr Jarold Motto; h/o "fatty liver"   GERD (gastroesophageal reflux disease)    HLD (hyperlipidemia)    Hypertension    Inguinal hernia    right   Squamous cell carcinoma    top of head   Past Surgical History:  Procedure Laterality Date   ACHILLES TENDON REPAIR  1999   BLADDER SURGERY     CARPAL TUNNEL RELEASE Right 11/04/2014   Procedure: LIMITED OPEN RIGHT CARPAL TUNNEL RELEASE;  Surgeon: Dominica Severin, MD;  Location: [facility];  Service: Orthopedics;  Laterality: Right;   CARPAL TUNNEL WITH CUBITAL TUNNEL Left 07/22/2014   Procedure: LEFT CARPAL TUNNEL RELEASE AND CUBITAL TUNNEL RELEASE TRANSPOSITION;  Surgeon: Dominica Severin, MD;  Location: [facility];  Service: Orthopedics;  Laterality: Left;   COLONOSCOPY     EYE SURGERY     laser eye  surgery bilat    HERNIA REPAIR  07/2011   left side   INGUINAL HERNIA REPAIR  08/09/2011   Procedure: LAPAROSCOPIC INGUINAL HERNIA;  Surgeon: Clovis Pu. Cornett, MD;  Location: [facility];  Service: General;  Laterality: Right;   KNEE ARTHROSCOPY  1993   left knee   KNEE SURGERY     age 55- right   SQUAMOUS CELL CARCINOMA EXCISION     left knee   TOTAL KNEE ARTHROPLASTY Left 05/04/2019   Procedure: LEFT TOTAL KNEE ARTHROPLASTY;  Surgeon: Valeria Batman, MD;  Location: [facility];  Service: Orthopedics;  Laterality: Left;   TOTAL KNEE ARTHROPLASTY Right 05/09/2020   Procedure: RIGHT TOTAL KNEE ARTHROPLASTY;  Surgeon: Valeria Batman, MD;  Location: [facility];  Service: Orthopedics;  Laterality: Right;   Social History   Socioeconomic History   Marital status: Married    Spouse name: Not on file   Number of children: 1   Years of education: Not on file   Highest education level: Not on file  Occupational History   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   Occasional smoker   Social Determinants of Corporate investment banker Strain: Not on file  Food Insecurity: Not on file  Transportation Needs: Not on file  Physical Activity: Not on file  Stress: Not on file  Social Connections: Not on file   Allergies   Allergen Reactions   Meth-Hyo-M Bl-Na Phos-Ph Sal Other (See Comments)    [facility] believes it may have contributed to renal failure accourding to his spouse who informed us at 17:17 01/19/2021   Codeine Nausea Only   Family History  Problem Relation Age of Onset   Diabetes Mother    Hypertension Mother    Heart disease Mother    Heart failure Mother    Stroke Father 77   Parkinsonism Father    Depression Brother    Alcohol abuse Brother    Cancer Maternal Grandmother        bone   Colon cancer Neg Hx    Colon polyps Neg Hx    Esophageal cancer Neg Hx    Rectal cancer Neg Hx    Stomach cancer Neg Hx     Current Outpatient Medications (Endocrine & Metabolic):    metFORMIN (GLUCOPHAGE) 1000 MG tablet, Take 1 tablet (1,000 mg total) by mouth 2 (two) times daily with a meal.   predniSONE (DELTASONE) 20 MG tablet, Take 4 tablets by mouth daily until instructed to taper by your oncologist   SitaGLIPtin-MetFORMIN HCl [id number] MG TB24, Take 1 tablet by mouth daily.   sitaGLIPtin (JANUVIA) 100 MG tablet, Take 1 tablet (100 mg total) by mouth daily.  Current Outpatient Medications (Cardiovascular):    atorvastatin (LIPITOR) 40 MG tablet, Take 1 tablet (40 mg total) by mouth daily.   diltiazem (CARDIZEM CD) 120 MG 24 hr capsule, Take 1 capsule (120 mg total) by mouth daily.   metoprolol tartrate (LOPRESSOR) 50 MG tablet, Take 1 tablet (50 mg total) by mouth 2 (two) times daily.   Current Outpatient Medications (Analgesics):    HYDROcodone-acetaminophen (NORCO) 5-325 MG tablet, Take 1 tablet by mouth every 4 (four) hours as needed for moderate pain or severe pain.   traMADol (ULTRAM) 50 MG tablet, Take 1 tablet (50 mg total) by mouth every 6 (six) hours as needed.  Current Outpatient Medications (Hematological):    cyanocobalamin (VITAMIN B12) 1000 MCG/ML injection, Inject 1mL into muscle once weekly for 4 weeks, then 1mL injected into muscle once monthly there after for 5  months.  Current Outpatient Medications (Other):    amoxicillin (AMOXIL) 500 MG capsule, Take 4 capsules by mouth 1 hour prior to dental procedures   blood glucose meter kit and supplies KIT, Dispense based on patient and insurance preference. Use up to four times daily as directed.   Cholecalciferol (VITAMIN D3) 50 MCG (2000 UT) capsule, Take 2,000 Units by mouth daily.   DULoxetine (CYMBALTA) 20 MG capsule, Take 1 capsule (20 mg total)  by mouth daily.   gabapentin (NEURONTIN) 100 MG capsule, Take 2 capsules (200 mg total) by mouth at bedtime.   glucose blood test strip, Check blood sugars twice daily   Insulin Pen Needle 31G X 5 MM MISC, As directed   INV-pembrolizumab [id number] ([id number]) 100 MG/4ML SOLN, Inject into the vein.   Lancets (ONETOUCH ULTRASOFT) lancets, Check blood sugars twice daily   NEEDLE, DISP, 25 G (B-D DISP NEEDLE 25GX1") 25G X 1" MISC, To use w/ B12 injections   nystatin-triamcinolone ointment (MYCOLOG), Apply 1 application topically 2 (two) times daily.   ondansetron (ZOFRAN) 8 MG tablet, Take 1 tablet (8 mg total) by mouth every 8 (eight) hours as needed for nausea.   SYRINGE-NEEDLE, DISP, 3 ML 23G X 1" 3 ML MISC, To inject B12   triamcinolone cream (KENALOG) 0.1 %, Apply thin layer to affected area 2 times a day as needed   Reviewed prior external information including notes and imaging from  primary care provider As well as notes that were available from care everywhere and other healthcare systems.  Past medical history, social, surgical and family history all reviewed in electronic medical record.  No pertanent information unless stated regarding to the chief complaint.   Review of Systems:  No headache, visual changes, nausea, vomiting, diarrhea, constipation, dizziness, abdominal pain, skin rash, fevers, chills, night sweats, weight loss, swollen lymph nodes, body aches, joint swelling, chest pain, shortness of breath, mood changes. POSITIVE muscle  aches  Objective  Blood pressure 124/84, pulse 76, height 5\' 7"  (1.702 m), weight 194 lb (88 kg), SpO2 97%.   General: No apparent distress alert and oriented x3 mood and affect normal, dressed appropriately.  HEENT: Pupils equal, extraocular movements intact  Respiratory: Patient's speak in full sentences and does not appear short of breath  Cardiovascular: No lower extremity edema, non tender, no erythema  Right ankle exam shows significant decrease in the swelling that was noted previously.  Patient still has some tenderness noted of the ankle.  Still has some limited range of motion. Low back exam tender to palpation over the ASIS left sacroiliac joint again.  Positive FABER test noted on the left side.  Negative straight leg test noted.    Impression and Recommendations:     The above documentation has been reviewed and is accurate and complete Judi Saa, DO

## 2023-01-30 ENCOUNTER — Other Ambulatory Visit: Payer: Self-pay

## 2023-01-30 ENCOUNTER — Encounter: Payer: Self-pay | Admitting: Family Medicine

## 2023-01-30 ENCOUNTER — Ambulatory Visit: Payer: Medicare Other | Admitting: Family Medicine

## 2023-01-30 VITALS — BP 124/84 | HR 76 | Ht 67.0 in | Wt 194.0 lb

## 2023-01-30 DIAGNOSIS — M19071 Primary osteoarthritis, right ankle and foot: Secondary | ICD-10-CM | POA: Diagnosis not present

## 2023-01-30 DIAGNOSIS — M25571 Pain in right ankle and joints of right foot: Secondary | ICD-10-CM | POA: Diagnosis not present

## 2023-01-30 DIAGNOSIS — G8929 Other chronic pain: Secondary | ICD-10-CM

## 2023-01-30 DIAGNOSIS — M545 Low back pain, unspecified: Secondary | ICD-10-CM | POA: Diagnosis not present

## 2023-01-30 MED ORDER — KETOROLAC TROMETHAMINE 30 MG/ML IJ SOLN
30.0000 mg | Freq: Once | INTRAMUSCULAR | Status: AC
Start: 2023-01-30 — End: 2023-01-30
  Administered 2023-01-30: 30 mg via INTRAMUSCULAR

## 2023-01-30 MED ORDER — METHYLPREDNISOLONE ACETATE 40 MG/ML IJ SUSP
40.0000 mg | Freq: Once | INTRAMUSCULAR | Status: AC
Start: 2023-01-30 — End: 2023-01-30
  Administered 2023-01-30: 40 mg via INTRAMUSCULAR

## 2023-01-30 NOTE — Patient Instructions (Signed)
Lumbar xray Injections in backside MD [facility] We are here if you need [place]

## 2023-01-30 NOTE — Assessment & Plan Note (Signed)
Improvement noted after the PRP.  Discussed icing regimen and home exercises.  Discussed that we can repeat the PRP if necessary.  Hopeful that patient will will get significant amount of improvement again.  Did get nearly 9 months of improvement before needing a recurrent injection.

## 2023-01-30 NOTE — Assessment & Plan Note (Signed)
Has had difficulty previously.  Has responded to the sacroiliac joint.  Patient though does have active cancer and do want to consider further evaluation with x-rays to make sure there is no new bony abnormalities that could be contributing.  Patient given Toradol and Depo-Medrol injection today.  He was doing some increasing activity recently that could have caused some exacerbation.  Follow-up with me again in 4 to 6 weeks otherwise.  Patient knows if worsening pain to seek medical attention immediately

## 2023-02-05 ENCOUNTER — Other Ambulatory Visit (HOSPITAL_COMMUNITY): Payer: Self-pay

## 2023-02-05 MED ORDER — DEXAMETHASONE 4 MG PO TABS
4.0000 mg | ORAL_TABLET | Freq: Every day | ORAL | 0 refills | Status: DC
  Filled 2023-02-05: qty 4, 4d supply, fill #0

## 2023-02-11 ENCOUNTER — Emergency Department (HOSPITAL_COMMUNITY): Payer: Medicare Other

## 2023-02-11 ENCOUNTER — Other Ambulatory Visit: Payer: Self-pay

## 2023-02-11 ENCOUNTER — Inpatient Hospital Stay (HOSPITAL_COMMUNITY): Payer: Medicare Other

## 2023-02-11 ENCOUNTER — Other Ambulatory Visit (HOSPITAL_COMMUNITY): Payer: Self-pay

## 2023-02-11 ENCOUNTER — Encounter (HOSPITAL_COMMUNITY): Payer: Self-pay

## 2023-02-11 ENCOUNTER — Inpatient Hospital Stay (HOSPITAL_COMMUNITY)
Admission: EM | Admit: 2023-02-11 | Discharge: 2023-02-16 | DRG: 100 | Disposition: A | Discharge status: Hospice | Payer: Medicare Other | Attending: Internal Medicine | Admitting: Internal Medicine

## 2023-02-11 DIAGNOSIS — R569 Unspecified convulsions: Secondary | ICD-10-CM | POA: Diagnosis not present

## 2023-02-11 DIAGNOSIS — E11649 Type 2 diabetes mellitus with hypoglycemia without coma: Secondary | ICD-10-CM | POA: Diagnosis present

## 2023-02-11 DIAGNOSIS — G40901 Epilepsy, unspecified, not intractable, with status epilepticus: Secondary | ICD-10-CM

## 2023-02-11 DIAGNOSIS — Z885 Allergy status to narcotic agent status: Secondary | ICD-10-CM

## 2023-02-11 DIAGNOSIS — J9601 Acute respiratory failure with hypoxia: Secondary | ICD-10-CM | POA: Diagnosis present

## 2023-02-11 DIAGNOSIS — C7949 Secondary malignant neoplasm of other parts of nervous system: Secondary | ICD-10-CM | POA: Diagnosis present

## 2023-02-11 DIAGNOSIS — Z515 Encounter for palliative care: Secondary | ICD-10-CM | POA: Diagnosis not present

## 2023-02-11 DIAGNOSIS — G131 Other systemic atrophy primarily affecting central nervous system in neoplastic disease: Secondary | ICD-10-CM | POA: Diagnosis present

## 2023-02-11 DIAGNOSIS — E785 Hyperlipidemia, unspecified: Secondary | ICD-10-CM | POA: Diagnosis present

## 2023-02-11 DIAGNOSIS — R000 Tachycardia, unspecified: Secondary | ICD-10-CM | POA: Diagnosis present

## 2023-02-11 DIAGNOSIS — G40401 Other generalized epilepsy and epileptic syndromes, not intractable, with status epilepticus: Principal | ICD-10-CM | POA: Diagnosis present

## 2023-02-11 DIAGNOSIS — D72829 Elevated white blood cell count, unspecified: Secondary | ICD-10-CM | POA: Diagnosis present

## 2023-02-11 DIAGNOSIS — G936 Cerebral edema: Secondary | ICD-10-CM | POA: Diagnosis present

## 2023-02-11 DIAGNOSIS — K219 Gastro-esophageal reflux disease without esophagitis: Secondary | ICD-10-CM | POA: Diagnosis present

## 2023-02-11 DIAGNOSIS — Z96653 Presence of artificial knee joint, bilateral: Secondary | ICD-10-CM | POA: Diagnosis present

## 2023-02-11 DIAGNOSIS — Z7189 Other specified counseling: Secondary | ICD-10-CM | POA: Diagnosis not present

## 2023-02-11 DIAGNOSIS — Z833 Family history of diabetes mellitus: Secondary | ICD-10-CM

## 2023-02-11 DIAGNOSIS — Z8249 Family history of ischemic heart disease and other diseases of the circulatory system: Secondary | ICD-10-CM

## 2023-02-11 DIAGNOSIS — Z818 Family history of other mental and behavioral disorders: Secondary | ICD-10-CM

## 2023-02-11 DIAGNOSIS — Z66 Do not resuscitate: Secondary | ICD-10-CM | POA: Diagnosis not present

## 2023-02-11 DIAGNOSIS — R34 Anuria and oliguria: Secondary | ICD-10-CM | POA: Diagnosis not present

## 2023-02-11 DIAGNOSIS — F32A Depression, unspecified: Secondary | ICD-10-CM | POA: Diagnosis present

## 2023-02-11 DIAGNOSIS — Z79899 Other long term (current) drug therapy: Secondary | ICD-10-CM | POA: Diagnosis not present

## 2023-02-11 DIAGNOSIS — M1990 Unspecified osteoarthritis, unspecified site: Secondary | ICD-10-CM | POA: Diagnosis present

## 2023-02-11 DIAGNOSIS — R4182 Altered mental status, unspecified: Secondary | ICD-10-CM | POA: Diagnosis not present

## 2023-02-11 DIAGNOSIS — C7931 Secondary malignant neoplasm of brain: Secondary | ICD-10-CM | POA: Diagnosis present

## 2023-02-11 DIAGNOSIS — N179 Acute kidney failure, unspecified: Secondary | ICD-10-CM | POA: Diagnosis present

## 2023-02-11 DIAGNOSIS — R590 Localized enlarged lymph nodes: Secondary | ICD-10-CM | POA: Diagnosis present

## 2023-02-11 DIAGNOSIS — F17290 Nicotine dependence, other tobacco product, uncomplicated: Secondary | ICD-10-CM | POA: Diagnosis present

## 2023-02-11 DIAGNOSIS — E876 Hypokalemia: Secondary | ICD-10-CM | POA: Diagnosis present

## 2023-02-11 DIAGNOSIS — I10 Essential (primary) hypertension: Secondary | ICD-10-CM | POA: Diagnosis present

## 2023-02-11 DIAGNOSIS — Z794 Long term (current) use of insulin: Secondary | ICD-10-CM | POA: Diagnosis not present

## 2023-02-11 DIAGNOSIS — K760 Fatty (change of) liver, not elsewhere classified: Secondary | ICD-10-CM | POA: Diagnosis present

## 2023-02-11 DIAGNOSIS — C772 Secondary and unspecified malignant neoplasm of intra-abdominal lymph nodes: Secondary | ICD-10-CM | POA: Diagnosis present

## 2023-02-11 DIAGNOSIS — C679 Malignant neoplasm of bladder, unspecified: Secondary | ICD-10-CM | POA: Diagnosis not present

## 2023-02-11 DIAGNOSIS — Z7984 Long term (current) use of oral hypoglycemic drugs: Secondary | ICD-10-CM

## 2023-02-11 DIAGNOSIS — F419 Anxiety disorder, unspecified: Secondary | ICD-10-CM | POA: Diagnosis present

## 2023-02-11 DIAGNOSIS — Z888 Allergy status to other drugs, medicaments and biological substances status: Secondary | ICD-10-CM

## 2023-02-11 DIAGNOSIS — Z7952 Long term (current) use of systemic steroids: Secondary | ICD-10-CM

## 2023-02-11 DIAGNOSIS — Z79620 Long term (current) use of immunosuppressive biologic: Secondary | ICD-10-CM

## 2023-02-11 LAB — COMPREHENSIVE METABOLIC PANEL
ALT: 42 U/L (ref 0–44)
AST: 56 U/L — ABNORMAL HIGH (ref 15–41)
Albumin: 3.9 g/dL (ref 3.5–5.0)
Alkaline Phosphatase: 61 U/L (ref 38–126)
Anion gap: 24 — ABNORMAL HIGH (ref 5–15)
BUN: 16 mg/dL (ref 8–23)
CO2: 14 mmol/L — ABNORMAL LOW (ref 22–32)
Calcium: 9 mg/dL (ref 8.9–10.3)
Chloride: 98 mmol/L (ref 98–111)
Creatinine, Ser: 1.46 mg/dL — ABNORMAL HIGH (ref 0.61–1.24)
GFR, Estimated: 50 mL/min — ABNORMAL LOW (ref >60)
Glucose, Bld: 171 mg/dL — ABNORMAL HIGH (ref 70–99)
Potassium: 3.4 mmol/L — ABNORMAL LOW (ref 3.5–5.1)
Sodium: 136 mmol/L (ref 135–145)
Total Bilirubin: 0.7 mg/dL (ref 0.3–1.2)
Total Protein: 7.3 g/dL (ref 6.5–8.1)

## 2023-02-11 LAB — I-STAT CHEM 8, ED
BUN: 16 mg/dL (ref 8–23)
Calcium, Ion: 1.05 mmol/L — ABNORMAL LOW (ref 1.15–1.40)
Chloride: 100 mmol/L (ref 98–111)
Creatinine, Ser: 1.2 mg/dL (ref 0.61–1.24)
Glucose, Bld: 162 mg/dL — ABNORMAL HIGH (ref 70–99)
HCT: 47 % (ref 39.0–52.0)
Hemoglobin: 16 g/dL (ref 13.0–17.0)
Potassium: 3.4 mmol/L — ABNORMAL LOW (ref 3.5–5.1)
Sodium: 135 mmol/L (ref 135–145)
TCO2: 14 mmol/L — ABNORMAL LOW (ref 22–32)

## 2023-02-11 LAB — PROTIME-INR
INR: 1.3 — ABNORMAL HIGH (ref 0.8–1.2)
Prothrombin Time: 16.3 seconds — ABNORMAL HIGH (ref 11.4–15.2)

## 2023-02-11 LAB — I-STAT ARTERIAL BLOOD GAS, ED
Acid-base deficit: 3 mmol/L — ABNORMAL HIGH (ref 0.0–2.0)
Bicarbonate: 21.1 mmol/L (ref 20.0–28.0)
Calcium, Ion: 1.16 mmol/L (ref 1.15–1.40)
HCT: 41 % (ref 39.0–52.0)
Hemoglobin: 13.9 g/dL (ref 13.0–17.0)
O2 Saturation: 100 %
Patient temperature: 98.2
Potassium: 3.5 mmol/L (ref 3.5–5.1)
Sodium: 135 mmol/L (ref 135–145)
TCO2: 22 mmol/L (ref 22–32)
pCO2 arterial: 34.2 mmHg (ref 32–48)
pH, Arterial: 7.397 (ref 7.35–7.45)
pO2, Arterial: 396 mmHg — ABNORMAL HIGH (ref 83–108)

## 2023-02-11 LAB — DIFFERENTIAL
Abs Immature Granulocytes: 0 10*3/uL (ref 0.00–0.07)
Basophils Absolute: 0 10*3/uL (ref 0.0–0.1)
Basophils Relative: 0 %
Eosinophils Absolute: 0.2 10*3/uL (ref 0.0–0.5)
Eosinophils Relative: 1 %
Lymphocytes Relative: 29 %
Lymphs Abs: 5.2 10*3/uL — ABNORMAL HIGH (ref 0.7–4.0)
Monocytes Absolute: 1.6 10*3/uL — ABNORMAL HIGH (ref 0.1–1.0)
Monocytes Relative: 9 %
Neutro Abs: 10.9 10*3/uL — ABNORMAL HIGH (ref 1.7–7.7)
Neutrophils Relative %: 61 %
nRBC: 0 /100 WBC

## 2023-02-11 LAB — URINALYSIS, ROUTINE W REFLEX MICROSCOPIC
Bilirubin Urine: NEGATIVE
Glucose, UA: NEGATIVE mg/dL
Ketones, ur: NEGATIVE mg/dL
Leukocytes,Ua: NEGATIVE
Nitrite: NEGATIVE
Protein, ur: 30 mg/dL — AB
Specific Gravity, Urine: 1.039 — ABNORMAL HIGH (ref 1.005–1.030)
pH: 5 (ref 5.0–8.0)

## 2023-02-11 LAB — ETHANOL: Alcohol, Ethyl (B): <10 mg/dL (ref <10)

## 2023-02-11 LAB — CBC
HCT: 48.1 % (ref 39.0–52.0)
Hemoglobin: 14.3 g/dL (ref 13.0–17.0)
MCH: 28.7 pg (ref 26.0–34.0)
MCHC: 29.7 g/dL — ABNORMAL LOW (ref 30.0–36.0)
MCV: 96.6 fL (ref 80.0–100.0)
Platelets: 321 10*3/uL (ref 150–400)
RBC: 4.98 MIL/uL (ref 4.22–5.81)
RDW: 14 % (ref 11.5–15.5)
WBC: 17.8 10*3/uL — ABNORMAL HIGH (ref 4.0–10.5)
nRBC: 0 % (ref 0.0–0.2)

## 2023-02-11 LAB — RAPID URINE DRUG SCREEN, HOSP PERFORMED
Amphetamines: NOT DETECTED
Barbiturates: NOT DETECTED
Benzodiazepines: POSITIVE — AB
Cocaine: NOT DETECTED
Opiates: NOT DETECTED
Tetrahydrocannabinol: NOT DETECTED

## 2023-02-11 LAB — APTT: aPTT: 22 seconds — ABNORMAL LOW (ref 24–36)

## 2023-02-11 LAB — CBG MONITORING, ED: Glucose-Capillary: 160 mg/dL — ABNORMAL HIGH (ref 70–99)

## 2023-02-11 LAB — MRSA NEXT GEN BY PCR, NASAL: MRSA by PCR Next Gen: NOT DETECTED

## 2023-02-11 MED ORDER — PROPOFOL 1000 MG/100ML IV EMUL
INTRAVENOUS | Status: AC
  Administered 2023-02-11: 31.0078 ug/kg/min via INTRAVENOUS
  Filled 2023-02-11: qty 100

## 2023-02-11 MED ORDER — LEVETIRACETAM IN NACL 1000 MG/100ML IV SOLN
1000.0000 mg | Freq: Once | INTRAVENOUS | Status: AC
  Administered 2023-02-11: 1000 mg via INTRAVENOUS

## 2023-02-11 MED ORDER — CHLORHEXIDINE GLUCONATE CLOTH 2 % EX PADS
6.0000 | MEDICATED_PAD | Freq: Every day | CUTANEOUS | Status: DC
  Administered 2023-02-11 – 2023-02-16 (×6): 6 via TOPICAL

## 2023-02-11 MED ORDER — FENTANYL CITRATE PF 50 MCG/ML IJ SOSY
25.0000 ug | PREFILLED_SYRINGE | INTRAMUSCULAR | Status: AC | PRN
  Administered 2023-02-15: 25 ug via INTRAVENOUS
  Administered 2023-02-16 (×2): 12.5 ug via INTRAVENOUS
  Administered 2023-02-16: 25 ug via INTRAVENOUS
  Filled 2023-02-11 (×3): qty 1

## 2023-02-11 MED ORDER — ETOMIDATE 2 MG/ML IV SOLN
INTRAVENOUS | Status: AC
  Filled 2023-02-11: qty 20

## 2023-02-11 MED ORDER — GADOBUTROL 1 MMOL/ML IV SOLN
8.5000 mL | Freq: Once | INTRAVENOUS | Status: AC | PRN
  Administered 2023-02-11: 8.5 mL via INTRAVENOUS

## 2023-02-11 MED ORDER — POLYETHYLENE GLYCOL 3350 17 G PO PACK: 17.0000 g | PACK | Freq: Every day | ORAL | Status: DC | PRN

## 2023-02-11 MED ORDER — ROCURONIUM BROMIDE 10 MG/ML (PF) SYRINGE
PREFILLED_SYRINGE | INTRAVENOUS | Status: AC
  Filled 2023-02-11: qty 10

## 2023-02-11 MED ORDER — SODIUM CHLORIDE 0.9 % IV SOLN: 2000.0000 mg | Freq: Once | INTRAVENOUS | Status: DC

## 2023-02-11 MED ORDER — FENTANYL CITRATE PF 50 MCG/ML IJ SOSY
25.0000 ug | PREFILLED_SYRINGE | INTRAMUSCULAR | Status: DC | PRN
  Administered 2023-02-12 – 2023-02-16 (×10): 50 ug via INTRAVENOUS
  Administered 2023-02-16 (×2): 25 ug via INTRAVENOUS
  Filled 2023-02-11: qty 2
  Filled 2023-02-11 (×9): qty 1

## 2023-02-11 MED ORDER — ORAL CARE MOUTH RINSE: 15.0000 mL | OROMUCOSAL | Status: DC | PRN

## 2023-02-11 MED ORDER — LEVETIRACETAM IN NACL 1000 MG/100ML IV SOLN: 1000.0000 mg | Freq: Once | INTRAVENOUS | Status: AC

## 2023-02-11 MED ORDER — MIDAZOLAM HCL 2 MG/2ML IJ SOLN
6.0000 mg | Freq: Once | INTRAMUSCULAR | Status: AC
  Administered 2023-02-11: 6 mg via INTRAVENOUS
  Filled 2023-02-11: qty 6

## 2023-02-11 MED ORDER — LEVETIRACETAM IN NACL 500 MG/100ML IV SOLN: 500.0000 mg | Freq: Two times a day (BID) | INTRAVENOUS | Status: DC

## 2023-02-11 MED ORDER — SUCCINYLCHOLINE CHLORIDE 200 MG/10ML IV SOSY
PREFILLED_SYRINGE | INTRAVENOUS | Status: AC
  Filled 2023-02-11: qty 10

## 2023-02-11 MED ORDER — ROCURONIUM BROMIDE 50 MG/5ML IV SOLN
INTRAVENOUS | Status: AC | PRN
  Administered 2023-02-11: 100 mg via INTRAVENOUS

## 2023-02-11 MED ORDER — HEPARIN SODIUM (PORCINE) 5000 UNIT/ML IJ SOLN
5000.0000 [IU] | Freq: Three times a day (TID) | INTRAMUSCULAR | Status: DC
  Administered 2023-02-11 – 2023-02-16 (×14): 5000 [IU] via SUBCUTANEOUS
  Filled 2023-02-11 (×14): qty 1

## 2023-02-11 MED ORDER — POTASSIUM CHLORIDE 10 MEQ/100ML IV SOLN
10.0000 meq | INTRAVENOUS | Status: AC
  Administered 2023-02-11 (×3): 10 meq via INTRAVENOUS
  Filled 2023-02-11 (×3): qty 100

## 2023-02-11 MED ORDER — FAMOTIDINE IN NACL 20-0.9 MG/50ML-% IV SOLN
20.0000 mg | Freq: Two times a day (BID) | INTRAVENOUS | Status: DC
  Administered 2023-02-11 – 2023-02-15 (×9): 20 mg via INTRAVENOUS
  Filled 2023-02-11 (×9): qty 50

## 2023-02-11 MED ORDER — ORAL CARE MOUTH RINSE
15.0000 mL | OROMUCOSAL | Status: DC
  Administered 2023-02-11 – 2023-02-16 (×57): 15 mL via OROMUCOSAL

## 2023-02-11 MED ORDER — LACTATED RINGERS IV SOLN: INTRAVENOUS | Status: AC

## 2023-02-11 MED ORDER — IOHEXOL 350 MG/ML SOLN
75.0000 mL | Freq: Once | INTRAVENOUS | Status: AC | PRN
  Administered 2023-02-11: 75 mL via INTRAVENOUS

## 2023-02-11 MED ORDER — ETOMIDATE 2 MG/ML IV SOLN
INTRAVENOUS | Status: AC | PRN
  Administered 2023-02-11: 20 mg via INTRAVENOUS

## 2023-02-11 MED ORDER — PROPOFOL 1000 MG/100ML IV EMUL
5.0000 ug/kg/min | INTRAVENOUS | Status: DC
  Administered 2023-02-11: 40 ug/kg/min via INTRAVENOUS
  Administered 2023-02-11: 50 ug/kg/min via INTRAVENOUS
  Administered 2023-02-12 (×2): 20 ug/kg/min via INTRAVENOUS
  Administered 2023-02-12 (×2): 40 ug/kg/min via INTRAVENOUS
  Administered 2023-02-13: 50 ug/kg/min via INTRAVENOUS
  Administered 2023-02-13: 20 ug/kg/min via INTRAVENOUS
  Administered 2023-02-13 (×2): 25 ug/kg/min via INTRAVENOUS
  Administered 2023-02-13 – 2023-02-14 (×2): 50 ug/kg/min via INTRAVENOUS
  Administered 2023-02-14: 20 ug/kg/min via INTRAVENOUS
  Administered 2023-02-14: 50 ug/kg/min via INTRAVENOUS
  Administered 2023-02-15: 15 ug/kg/min via INTRAVENOUS
  Filled 2023-02-11 (×15): qty 100

## 2023-02-11 MED ORDER — DEXAMETHASONE 4 MG PO TABS
ORAL_TABLET | ORAL | 0 refills | Status: DC
  Filled 2023-02-11: qty 4, 4d supply, fill #0

## 2023-02-11 MED ORDER — SODIUM CHLORIDE 0.9 % IV SOLN
INTRAVENOUS | Status: AC | PRN
Start: 2023-02-11 — End: 2023-02-11
  Administered 2023-02-11: 1000 mL via INTRAVENOUS

## 2023-02-11 MED ORDER — LEVETIRACETAM IN NACL 1000 MG/100ML IV SOLN
INTRAVENOUS | Status: AC
  Administered 2023-02-11: 1000 mg via INTRAVENOUS
  Filled 2023-02-11: qty 200

## 2023-02-11 MED ORDER — DOCUSATE SODIUM 50 MG/5ML PO LIQD: 100.0000 mg | Freq: Two times a day (BID) | ORAL | Status: DC | PRN

## 2023-02-11 MED ORDER — LEVETIRACETAM IN NACL 1000 MG/100ML IV SOLN
1000.0000 mg | Freq: Two times a day (BID) | INTRAVENOUS | Status: DC
  Administered 2023-02-12: 1000 mg via INTRAVENOUS
  Filled 2023-02-11: qty 100

## 2023-02-11 NOTE — ED Notes (Addendum)
ED TO INPATIENT HANDOFF REPORT  ED Nurse Name and Phone #: Denton Ar RN  S Name/Age/Gender Charles Trujillo 72 y.o. male Room/Bed: 032C/032C  Code Status   Code Status: DNR  Home/SNF/Other Pt came from home UTA orientation d/t pt sedated and intubated Is this baseline? No   Triage Complete: Triage complete  Chief Complaint AMS (altered mental status) [R41.82]  Triage Note Pt arrived via EMS from home as CODE STROKE LKW 1330. EMS reports pt's wife found pt on the floor having a grand mal sz. He was then postictal upon EMS arrival. Pt had witnessed focal sz with facial features. After sz, EMS reports Pt had R gaze, R side flaccid, garbled speech. Pt then had another grand mal sz. EMS gave total of 2.5mg  Versed. Initial BP 200/102, HR 80's, 98% on 4L, CBG 251. Last BP prior to arrival 130/76. EMS also reports posterior hematoma. 20g R hand and 18g L AC.   Allergies Allergies  Allergen Reactions   Meth-Hyo-M Bl-Na Phos-Ph Sal Other (See Comments)    [facility] believes it may have contributed to renal failure accourding to his spouse who informed us at 17:17 01/19/2021   Codeine Nausea Only    Level of Care/Admitting Diagnosis ED Disposition     ED Disposition  Admit   Condition  --   Comment  Hospital Area: [facility] [[id number]]  Level of Care: ICU [6]  May admit patient to [facility] or [facility] if equivalent level of care is available:: Yes  Covid Evaluation: Asymptomatic - no recent exposure (last 10 days) testing not required  Diagnosis: AMS (altered mental status) [[id number]]  Admitting Physician: Lorin Glass [[id number]]  Attending Physician: Lorin Glass [[id number]]  Certification:: I certify this patient will need inpatient services for at least 2 midnights  Estimated Length of Stay: 5          B Medical/Surgery History Past Medical History:  Diagnosis Date   Anxiety    Arthritis    bilateral knees   Depression    DM2 (diabetes  mellitus, type 2) (HCC)    Dysrhythmia     benign tachycardia, takes Cartia   Elevated LFTs    previously evaluated by Dr Jarold Motto; h/o "fatty liver"   GERD (gastroesophageal reflux disease)    HLD (hyperlipidemia)    Hypertension    Inguinal hernia    right   Squamous cell carcinoma    top of head   Past Surgical History:  Procedure Laterality Date   ACHILLES TENDON REPAIR  1999   BLADDER SURGERY     CARPAL TUNNEL RELEASE Right 11/04/2014   Procedure: LIMITED OPEN RIGHT CARPAL TUNNEL RELEASE;  Surgeon: Dominica Severin, MD;  Location: [facility];  Service: Orthopedics;  Laterality: Right;   CARPAL TUNNEL WITH CUBITAL TUNNEL Left 07/22/2014   Procedure: LEFT CARPAL TUNNEL RELEASE AND CUBITAL TUNNEL RELEASE TRANSPOSITION;  Surgeon: Dominica Severin, MD;  Location: [facility];  Service: Orthopedics;  Laterality: Left;   COLONOSCOPY     EYE SURGERY     laser eye surgery bilat    HERNIA REPAIR  07/2011   left side   INGUINAL HERNIA REPAIR  08/09/2011   Procedure: LAPAROSCOPIC INGUINAL HERNIA;  Surgeon: Clovis Pu. Cornett, MD;  Location: [facility];  Service: General;  Laterality: Right;   KNEE ARTHROSCOPY  1993   left knee   KNEE SURGERY     age 65- right   SQUAMOUS CELL CARCINOMA EXCISION  left knee   TOTAL KNEE ARTHROPLASTY Left 05/04/2019   Procedure: LEFT TOTAL KNEE ARTHROPLASTY;  Surgeon: Valeria Batman, MD;  Location: [facility];  Service: Orthopedics;  Laterality: Left;   TOTAL KNEE ARTHROPLASTY Right 05/09/2020   Procedure: RIGHT TOTAL KNEE ARTHROPLASTY;  Surgeon: Valeria Batman, MD;  Location: [facility];  Service: Orthopedics;  Laterality: Right;     A IV Location/Drains/Wounds Patient Lines/Drains/Airways Status     Active Line/Drains/Airways     Name Placement date Placement time Site Days   Peripheral IV 02/11/23 18 G Left Antecubital 02/11/23  1430  Antecubital  less than 1   Peripheral IV 02/11/23 20 G Posterior;Right Hand 02/11/23  1457   Hand  less than 1   NG/OG Vented/Dual Lumen 18 Fr. Oral 02/11/23  1459  Oral  less than 1   Urethral Catheter Denton Ar RN Temperature probe 16 Fr. 02/11/23  1540  Temperature probe  less than 1   Airway 8 mm 02/11/23  1450  -- less than 1   Incision (Closed) 05/09/20 Knee Right 05/09/20  0941  -- 1008            Intake/Output Last 24 hours  Intake/Output Summary (Last 24 hours) at 02/11/2023 1623 Last data filed at 02/11/2023 1604 Gross per 24 hour  Intake 1013.86 ml  Output --  Net 1013.86 ml    Labs/Imaging Results for orders placed or performed during the hospital encounter of 02/11/23 (from the past 48 hour(s))  Ethanol     Status: None   Collection Time: 02/11/23  2:30 PM  Result Value Ref Range   Alcohol, Ethyl (B) <10 <10 mg/dL    Comment: (NOTE) Lowest detectable limit for serum alcohol is 10 mg/dL.  For medical purposes only. Performed at [facility], [address] [place], [place] [address]   Protime-INR     Status: Abnormal   Collection Time: 02/11/23  2:30 PM  Result Value Ref Range   Prothrombin Time 16.3 (H) 11.4 - 15.2 seconds   INR 1.3 (H) 0.8 - 1.2    Comment: (NOTE) INR goal varies based on device and disease states. Performed at [facility], [address], [place], [place] [address]   APTT     Status: Abnormal   Collection Time: 02/11/23  2:30 PM  Result Value Ref Range   aPTT 22 (L) 24 - 36 seconds    Comment: Performed at [facility], [address] [place], [place] [address]  CBC     Status: Abnormal   Collection Time: 02/11/23  2:30 PM  Result Value Ref Range   WBC 17.8 (H) 4.0 - 10.5 K/uL   RBC 4.98 4.22 - 5.81 MIL/uL   Hemoglobin 14.3 13.0 - 17.0 g/dL   HCT 91.4 78.2 - 95.6 %   MCV 96.6 80.0 - 100.0 fL   MCH 28.7 26.0 - 34.0 pg   MCHC 29.7 (L) 30.0 - 36.0 g/dL   RDW 21.3 08.6 - 57.8 %   Platelets 321 150 - 400 K/uL   nRBC 0.0 0.0 - 0.2 %    Comment: Performed at [facility], [address]  [place], [place] [address]  Differential     Status: Abnormal   Collection Time: 02/11/23  2:30 PM  Result Value Ref Range   Neutrophils Relative % 61 %   Neutro Abs 10.9 (H) 1.7 - 7.7 K/uL   Lymphocytes Relative 29 %   Lymphs  Abs 5.2 (H) 0.7 - 4.0 K/uL   Monocytes Relative 9 %   Monocytes Absolute 1.6 (H) 0.1 - 1.0 K/uL   Eosinophils Relative 1 %   Eosinophils Absolute 0.2 0.0 - 0.5 K/uL   Basophils Relative 0 %   Basophils Absolute 0.0 0.0 - 0.1 K/uL   nRBC 0 0 /100 WBC   Abs Immature Granulocytes 0.00 0.00 - 0.07 K/uL   Acanthocytes PRESENT    Tear Drop Cells PRESENT     Comment: Performed at [facility], [address] [place], [place] [address]  Comprehensive metabolic panel     Status: Abnormal   Collection Time: 02/11/23  2:30 PM  Result Value Ref Range   Sodium 136 135 - 145 mmol/L   Potassium 3.4 (L) 3.5 - 5.1 mmol/L   Chloride 98 98 - 111 mmol/L   CO2 14 (L) 22 - 32 mmol/L   Glucose, Bld 171 (H) 70 - 99 mg/dL    Comment: Glucose reference range applies only to samples taken after fasting for at least 8 hours.   BUN 16 8 - 23 mg/dL   Creatinine, Ser 2.13 (H) 0.61 - 1.24 mg/dL   Calcium 9.0 8.9 - 08.6 mg/dL   Total Protein 7.3 6.5 - 8.1 g/dL   Albumin 3.9 3.5 - 5.0 g/dL   AST 56 (H) 15 - 41 U/L   ALT 42 0 - 44 U/L   Alkaline Phosphatase 61 38 - 126 U/L   Total Bilirubin 0.7 0.3 - 1.2 mg/dL   GFR, Estimated 50 (L) >60 mL/min    Comment: (NOTE) Calculated using the CKD-EPI Creatinine Equation (2021)    Anion gap 24 (H) 5 - 15    Comment: ELECTROLYTES REPEATED TO VERIFY Performed at [facility], [address], [place], [place] [address]   CBG monitoring, ED     Status: Abnormal   Collection Time: 02/11/23  2:31 PM  Result Value Ref Range   Glucose-Capillary 160 (H) 70 - 99 mg/dL    Comment: Glucose reference range applies only to samples taken after fasting for at least 8 hours.  I-stat chem 8, ED     Status: Abnormal   Collection Time: 02/11/23  2:39  PM  Result Value Ref Range   Sodium 135 135 - 145 mmol/L   Potassium 3.4 (L) 3.5 - 5.1 mmol/L   Chloride 100 98 - 111 mmol/L   BUN 16 8 - 23 mg/dL   Creatinine, Ser 9.62 0.61 - 1.24 mg/dL   Glucose, Bld 952 (H) 70 - 99 mg/dL    Comment: Glucose reference range applies only to samples taken after fasting for at least 8 hours.   Calcium, Ion 1.05 (L) 1.15 - 1.40 mmol/L   TCO2 14 (L) 22 - 32 mmol/L   Hemoglobin 16.0 13.0 - 17.0 g/dL   HCT 84.1 32.4 - 40.1 %  I-Stat arterial blood gas, ED     Status: Abnormal   Collection Time: 02/11/23  3:48 PM  Result Value Ref Range   pH, Arterial 7.397 7.35 - 7.45   pCO2 arterial 34.2 32 - 48 mmHg   pO2, Arterial 396 (H) 83 - 108 mmHg   Bicarbonate 21.1 20.0 - 28.0 mmol/L   TCO2 22 22 - 32 mmol/L   O2 Saturation 100 %   Acid-base deficit 3.0 (H) 0.0 - 2.0 mmol/L   Sodium 135 135 - 145 mmol/L   Potassium 3.5 3.5 - 5.1 mmol/L   Calcium,  Ion 1.16 1.15 - 1.40 mmol/L   HCT 41.0 39.0 - 52.0 %   Hemoglobin 13.9 13.0 - 17.0 g/dL   Patient temperature 09.8 F    Collection site RADIAL, ALLEN'S TEST ACCEPTABLE    Drawn by RT    Sample type ARTERIAL    CT ANGIO HEAD NECK W WO CM (CODE STROKE)  Result Date: 02/11/2023 CLINICAL DATA:  Code stroke EXAM: CT ANGIOGRAPHY HEAD AND NECK WITH AND WITHOUT CONTRAST TECHNIQUE: Multidetector CT imaging of the head and neck was performed using the standard protocol during bolus administration of intravenous contrast. Multiplanar CT image reconstructions and MIPs were obtained to evaluate the vascular anatomy. Carotid stenosis measurements (when applicable) are obtained utilizing NASCET criteria, using the distal internal carotid diameter as the denominator. RADIATION DOSE REDUCTION: This exam was performed according to the departmental dose-optimization program which includes automated exposure control, adjustment of the mA and/or kV according to patient size and/or use of iterative reconstruction technique. CONTRAST:  75mL  OMNIPAQUE IOHEXOL 350 MG/ML SOLN COMPARISON:  Same-day noncontrast CT head FINDINGS: CTA NECK FINDINGS Aortic arch: There is a minimal calcified plaque in the imaged aortic arch. The origins of the major branch vessels are patent. The subclavian arteries are patent to the level imaged. Right carotid system: The right common carotid artery is patent. There is mixed plaque at the bifurcation and in the proximal internal carotid artery resulting in up to approximately 50% stenosis the distal internal carotid artery is tortuous but widely patent. The external carotid artery is patent. There is no evidence of dissection or aneurysm. Left carotid system: The left common, internal, and external carotid arteries are patent, with mild plaque at the bifurcation but no hemodynamically significant stenosis or occlusion. There is no evidence of dissection or aneurysm. Vertebral arteries: The right V1 and proximal V2 segments are patent. There is occlusion of the vertebral artery throughout the mid and distal V2 segment with reconstitution of flow in the V3 segment. The left vertebral artery is patent throughout. Skeleton: There is advanced degenerative change throughout the cervical spine. There is no visible canal hematoma. There is no acute osseous abnormality or suspicious osseous lesion. Other neck: The endotracheal tube tip terminates in the midthoracic trachea. There is left supraclavicular lymphadenopathy with largest conglomerate measuring up to 1.6 cm x 3.3 cm. Upper chest: The imaged lung apices are clear. Review of the MIP images confirms the above findings CTA HEAD FINDINGS Anterior circulation: There is calcified plaque in the intracranial ICAs without greater than mild stenosis. The bilateral MCAs are patent, without proximal stenosis or occlusion. The bilateral ACAS are patent, without proximal stenosis or occlusion. The anterior communicating artery is not definitely seen. There is ill-defined cortical  hyperdensity over the operculum on the arterial phase images which is more conspicuously within the sulci of the frontal and temporal lobes on the more delayed phase images, most suspicious for contrast staining. There is no aneurysm or AVM. Posterior circulation: The bilateral V4 segments are patent, left dominant. The basilar artery is patent. The PICA origins are not well seen. The other major cerebellar arteries appear patent. The bilateral PCAs are patent, without proximal stenosis or occlusion. The posterior communicating arteries are not definitely seen. There is no aneurysm or AVM. Venous sinuses: Patent. Anatomic variants: None. Review of the MIP images confirms the above findings IMPRESSION: 1. Sulcal hyperdensity on the noncontrast study and conspicuous postcontrast enhancement within the sulci of the right frontal and temporal lobes on the delayed phase  images. Given right parietal scalp hematoma. Findings are favored to reflect subarachnoid hemorrhage and contrast staining with underlying parenchymal edema, with subacute infarct considered less likely. 2. Occluded right V2 segment with reconstitution of flow in the V3 segment. Acute dissection is not excluded. 3. Mixed plaque at the right carotid bifurcation resulting in approximately 50% stenosis. 4. No other hemodynamically significant stenosis or occlusion in the vasculature of the head or neck. 5. Left supraclavicular lymphadenopathy is suspicious for malignancy. Consider imaging of the chest/abdomen/pelvis when clinically appropriate. The acute findings called by telephone at the time of interpretation on 02/11/2023 at 3:42 pm to provider Dr Amada Jupiter, who verbally acknowledged these results. Electronically Signed   By: Lesia Hausen M.D.   On: 02/11/2023 15:55   DG Chest Portable 1 View  Result Date: 02/11/2023 CLINICAL DATA:  tube placement EXAM: PORTABLE CHEST 1 VIEW COMPARISON:  05/01/2020. FINDINGS: Low lung volume. There are patchy  atelectatic changes at the left lung base. No mass or consolidation. Bilateral lung fields are otherwise clear. Bilateral costophrenic angles are clear. Normal cardio-mediastinal silhouette. No acute osseous abnormalities. The soft tissues are within normal limits. Tube/lines: An enteric tube extends below diaphragm, tip and sideport overlying the stomach. Endotracheal tube tip is 1.4 cm from the carina. This could be pulled back approximately 2 cm, if clinically desired. IMPRESSION: 1. Low lung volume with left basilar atelectasis. 2. Endotracheal tube tip 1.4 cm from the carina. This could be pulled back approximately 2 cm, if clinically desired. 3. Enteric tube in satisfactory position. Electronically Signed   By: Jules Schick M.D.   On: 02/11/2023 15:38   CT HEAD CODE STROKE WO CONTRAST  Result Date: 02/11/2023 CLINICAL DATA:  Code stroke.  Neuro deficit, acute, stroke suspected EXAM: CT HEAD WITHOUT CONTRAST TECHNIQUE: Contiguous axial images were obtained from the base of the skull through the vertex without intravenous contrast. RADIATION DOSE REDUCTION: This exam was performed according to the departmental dose-optimization program which includes automated exposure control, adjustment of the mA and/or kV according to patient size and/or use of iterative reconstruction technique. COMPARISON:  CT head 07/04/2021. FINDINGS: Brain: Abnormal edema in the right frontal lobe with thickened and dense adjacent cortex. (for example series 2, image 23). No convincing evidence of acute infarct on motion limited assessment. No midline shift or hydrocephalus. Vascular: No hyperdense vessel identified. Skull: No acute fracture. Sinuses/Orbits: Clear sinuses.  No acute orbital finding. Other: No mastoid effusions. IMPRESSION: Abnormal edema in the right frontal lobe with somewhat thickened and dense adjacent cortex. This could potentially be due to acute/subacute infarct with petechial hemorrhage or underlying lesion  (such as a metastasis in this patient with reported history of bladder cancer). Recommend MRI with contrast to better evaluate. Code stroke imaging results were communicated on 02/11/2023 at 2:52 pm to provider Dr. Amada Jupiter via telephone, who verbally acknowledged these results. Electronically Signed   By: Feliberto Harts M.D.   On: 02/11/2023 15:02    Pending Labs Unresulted Labs (From admission, onward)     Start     Ordered   02/12/23 0500  CBC  Tomorrow morning,   R        02/11/23 1600   02/12/23 0500  Basic metabolic panel  Tomorrow morning,   R        02/11/23 1600   02/12/23 0500  Magnesium  Tomorrow morning,   R        02/11/23 1600   02/12/23 0500  Phosphorus  Tomorrow morning,  R        02/11/23 1600   02/11/23 1600  Blood gas, arterial  Once,   R        02/11/23 1502   02/11/23 1558  CBC  (heparin)  Once,   R       Comments: Baseline for heparin therapy IF NOT ALREADY DRAWN.  Notify MD if PLT < 100 K.    02/11/23 1600   02/11/23 1558  Creatinine, serum  (heparin)  Once,   R       Comments: Baseline for heparin therapy IF NOT ALREADY DRAWN.    02/11/23 1600   02/11/23 1434  Urine rapid drug screen (hosp performed)  Once,   STAT        02/11/23 1434   02/11/23 1434  Urinalysis, Routine w reflex microscopic -Urine, Clean Catch  Once,   URGENT       Question:  Specimen Source  Answer:  Urine, Clean Catch   02/11/23 1434            Vitals/Pain Today's Vitals   02/11/23 1552 02/11/23 1553 02/11/23 1600 02/11/23 1615  BP:  117/83 107/76 (!) 157/96  Pulse: 78  77 76  Resp: 18  18 18   Temp: 98.5 F (36.9 C)  98.6 F (37 C) 98.6 F (37 C)  TempSrc:      SpO2: 100%  98% 99%  Weight: 86 kg     Height: 5\' 7"  (1.702 m)       Isolation Precautions No active isolations  Medications Medications  succinylcholine (ANECTINE) 200 MG/10ML syringe (  Not Given 02/11/23 1528)  propofol (DIPRIVAN) 1000 MG/100ML infusion (35 mcg/kg/min  88 kg Intravenous Infusion  Verify 02/11/23 1604)  docusate (COLACE) 50 MG/5ML liquid 100 mg (has no administration in time range)  polyethylene glycol (MIRALAX / GLYCOLAX) packet 17 g (has no administration in time range)  heparin injection 5,000 Units (has no administration in time range)  famotidine (PEPCID) IVPB 20 mg premix (has no administration in time range)  etomidate (AMIDATE) injection ( Intravenous Canceled Entry 02/11/23 1500)  rocuronium (ZEMURON) injection ( Intravenous Canceled Entry 02/11/23 1500)  0.9 %  sodium chloride infusion (0 mLs Intravenous Stopped 02/11/23 1604)  levETIRAcetam (KEPPRA) IVPB 1000 mg/100 mL premix (0 mg Intravenous Stopped 02/11/23 1510)    Followed by  levETIRAcetam (KEPPRA) IVPB 1000 mg/100 mL premix (0 mg Intravenous Stopped 02/11/23 1510)  iohexol (OMNIPAQUE) 350 MG/ML injection 75 mL (75 mLs Intravenous Contrast Given 02/11/23 1513)    Mobility Intubated, prior to arrival pt was walking independently     Focused Assessments Neuro Assessment Handoff:  Swallow screen pass? No  Cardiac Rhythm: Sinus tachycardia NIH Stroke Scale  Dizziness Present: No Headache Present: No Interval: Initial Level of Consciousness (1a.)   : Not alert, requires repeated stimulation to attend, or is obtunded and requires strong or painful stimulation to make movements (not stereotyped) LOC Questions (1b. )   : Answers neither question correctly LOC Commands (1c. )   : Performs neither task correctly Best Gaze (2. )  : Normal Visual (3. )  : Bilateral hemianopia (blind including cortical blindness) Facial Palsy (4. )    : Minor paralysis Motor Arm, Left (5a. )   : No effort against gravity Motor Arm, Right (5b. ) : Some effort against gravity Motor Leg, Left (6a. )  : No effort against gravity Motor Leg, Right (6b. ) : Some effort against gravity Limb Ataxia (7. ): Absent Sensory (  8. )  : Normal, no sensory loss Best Language (9. )  : Mute, global aphasia Dysarthria (10. ): Severe  dysarthria, patient's speech is so slurred as to be unintelligible in the absence of or out of proportion to any dysphasia, or is mute/anarthric Extinction/Inattention (11.)   : No Abnormality Complete NIHSS TOTAL: 25 Last date known well: 02/11/23 Last time known well: 1330 Neuro Assessment: Exceptions to WDL Neuro Checks:   Initial (02/11/23 1445)  Has TPA been given? No If patient is a Neuro Trauma and patient is going to OR before floor call report to 4N Charge nurse: [phone] or [phone]   R Recommendations: See Admitting Provider Note  Report given to:   Additional Notes: Pt is chemo pt for bladder cancer. Please read triage note for further detail about why pt is here. Wife is at bedside and is calm and cooperative. Pt intubated, temp foley in place, Propofol infusing for sedation and currently set to . EEG at bedside and hooking pt up to EEG leads.  Next NIHSS due at 1700 per Stroke RN

## 2023-02-11 NOTE — Code Documentation (Signed)
Stroke Response Nurse Documentation Code Documentation  Charles Trujillo is a 72 y.o. male arriving to [facility]  via [facility] on 7/30 with past medical hx of hld, diabetes, bladder cancer. On No antithrombotic. Code stroke was activated by EMS.   Patient from home where he was LKW at 1330 at which time the patient was noted to fall out of the dining room chair and have a grand mal seizure. Pt post ictal after that and with EMS had one more focal seizure and had another grand mal seizure. EMS reported R sided weakness, R gaze, and garbled speech after same. Patient somnolent on arrival to ED.   Stroke team at the bedside on patient arrival. Labs drawn and patient cleared for CT by Dr. Dalene Seltzer. Patient to CT with team. NIHSS 25, see documentation for details and code stroke times. Patient taken back to room in between CT and CTA to intubate for airway protection. Patient with decreased LOC, disoriented, not following commands, bilateral hemianopia, right facial droop, bilateral arm weakness, bilateral leg weakness, Global aphasia , and dysarthria  on exam. The following imaging was completed:  CT Head and CTA. Patient is not a candidate for IV Thrombolytic due to provider discretion. Patient is not not a candidate for IR due to no LVO on imaging per MD.   Care Plan: q2 hr neuro checks, EEG.   Bedside handoff with ED RN Irving Burton.    Pearlie Oyster  Stroke Response RN

## 2023-02-11 NOTE — Consult Note (Addendum)
Neurology Consultation  Reason for Consult: Code stroke Referring Physician: Dr. Dalene Seltzer  CC: right gaze and seizures  History is obtained from:EMS and medical record   HPI: Charles Trujillo is a 72 y.o. male with past medical history of HTN, HLD, DM, GERD, anxiety and depression, bladder cancer who presents to [facility] ED via EMS after experiencing a grand mal seizure at home per EMS. LKW 1330. Wife was with him, left the room and came back and found him having a seizure on the floor. EMS called and activated a code stroke. Initially on EMS evaluation he was hypertensive at 200/100's, BG 251. Per EMS en route to hospital he had a focal seizure involving his mouth and than had a grand mal seizure and was given 2.5 mg versed.   CT head Abnormal edema in the right frontal lobe with somewhat thickened and dense adjacent cortex. This could potentially be due to acute/subacute infarct with petechial hemorrhage or underlying lesion.  Patient required intubation after CT head and was brought back to ED room to be intubated by ED. He was also given 2g IV Keppra and STAT EEG ordered. CTA head and neck with no LVO.   LKW: 1330 IV thrombolysis given?: no, no acute stroke  EVT: No LVO Premorbid modified Rankin scale (mRS):  1-No significant post stroke disability and can perform usual duties with stroke symptoms  ROS:  Unable to obtain due to altered mental status.   Past Medical History:  Diagnosis Date   Anxiety    Arthritis    bilateral knees   Depression    DM2 (diabetes mellitus, type 2) (HCC)    Dysrhythmia     benign tachycardia, takes Cartia   Elevated LFTs    previously evaluated by Dr Jarold Motto; h/o "fatty liver"   GERD (gastroesophageal reflux disease)    HLD (hyperlipidemia)    Hypertension    Inguinal hernia    right   Squamous cell carcinoma    top of head     Family History  Problem Relation Age of Onset   Diabetes Mother    Hypertension Mother    Heart disease Mother     Heart failure Mother    Stroke Father 58   Parkinsonism Father    Depression Brother    Alcohol abuse Brother    Cancer Maternal Grandmother        bone   Colon cancer Neg Hx    Colon polyps Neg Hx    Esophageal cancer Neg Hx    Rectal cancer Neg Hx    Stomach cancer Neg Hx      Social History:   reports that he has been smoking cigars. He has never used smokeless tobacco. He reports current alcohol use of about 3.0 - 4.0 standard drinks of alcohol per week. He reports that he does not use drugs.  Medications  Current Facility-Administered Medications:    0.9 %  sodium chloride infusion, , Intravenous, Code/Trauma/Sedation Continuous Med, Alvira Monday, MD, Last Rate: 999 mL/hr at 02/11/23 1456, 1,000 mL at 02/11/23 1456   etomidate (AMIDATE) 2 MG/ML injection, , , ,    etomidate (AMIDATE) injection, , Intravenous, Code/Trauma/Sedation Med, Alvira Monday, MD, 20 mg at 02/11/23 1447   levETIRAcetam (KEPPRA) 1000 MG/100ML IVPB, , , ,    levETIRAcetam (KEPPRA) 2,000 mg in sodium chloride 0.9 % 250 mL IVPB, 2,000 mg, Intravenous, Once, Artis Flock, Kalman Drape, NP   rocuronium (ZEMURON) injection, , Intravenous, Code/Trauma/Sedation Med, Alvira Monday, MD, 100  mg at 02/11/23 1448   rocuronium bromide 100 MG/10ML SOSY, , , ,    succinylcholine (ANECTINE) 200 MG/10ML syringe, , , ,   Current Outpatient Medications:    amoxicillin (AMOXIL) 500 MG capsule, Take 4 capsules by mouth 1 hour prior to dental procedures, Disp: 12 capsule, Rfl: 0   atorvastatin (LIPITOR) 40 MG tablet, Take 1 tablet (40 mg total) by mouth daily., Disp: 90 tablet, Rfl: 1   blood glucose meter kit and supplies KIT, Dispense based on patient and insurance preference. Use up to four times daily as directed., Disp: 1 each, Rfl: 0   Cholecalciferol (VITAMIN D3) 50 MCG (2000 UT) capsule, Take 2,000 Units by mouth daily., Disp: , Rfl:    cyanocobalamin (VITAMIN B12) 1000 MCG/ML injection, Inject 1mL into muscle once  weekly for 4 weeks, then 1mL injected into muscle once monthly there after for 5 months., Disp: 10 mL, Rfl: 0   dexamethasone (DECADRON) 4 MG tablet, Take 1 tablet (4 mg total) by mouth daily with breakfast for 4 days., Disp: 4 tablet, Rfl: 0   diltiazem (CARDIZEM CD) 120 MG 24 hr capsule, Take 1 capsule (120 mg total) by mouth daily., Disp: 90 capsule, Rfl: 1   DULoxetine (CYMBALTA) 20 MG capsule, Take 1 capsule (20 mg total) by mouth daily., Disp: 90 capsule, Rfl: 3   gabapentin (NEURONTIN) 100 MG capsule, Take 2 capsules (200 mg total) by mouth at bedtime., Disp: 180 capsule, Rfl: 0   glucose blood test strip, Check blood sugars twice daily, Disp: 200 each, Rfl: 11   HYDROcodone-acetaminophen (NORCO) 5-325 MG tablet, Take 1 tablet by mouth every 4 (four) hours as needed for moderate pain or severe pain., Disp: 20 tablet, Rfl: 0   Insulin Pen Needle 31G X 5 MM MISC, As directed, Disp: 100 each, Rfl: 0   INV-pembrolizumab EA3191 (MK-3475) 100 MG/4ML SOLN, Inject into the vein., Disp: , Rfl:    Lancets (ONETOUCH ULTRASOFT) lancets, Check blood sugars twice daily, Disp: 200 each, Rfl: 12   metFORMIN (GLUCOPHAGE) 1000 MG tablet, Take 1 tablet (1,000 mg total) by mouth 2 (two) times daily with a meal., Disp: 180 tablet, Rfl: 3   metoprolol tartrate (LOPRESSOR) 50 MG tablet, Take 1 tablet (50 mg total) by mouth 2 (two) times daily., Disp: 180 tablet, Rfl: 1   NEEDLE, DISP, 25 G (B-D DISP NEEDLE 25GX1") 25G X 1" MISC, To use w/ B12 injections, Disp: 50 each, Rfl: 0   nystatin-triamcinolone ointment (MYCOLOG), Apply 1 application topically 2 (two) times daily., Disp: 30 g, Rfl: 1   ondansetron (ZOFRAN) 8 MG tablet, Take 1 tablet (8 mg total) by mouth every 8 (eight) hours as needed for nausea., Disp: 60 tablet, Rfl: 3   predniSONE (DELTASONE) 20 MG tablet, Take 4 tablets by mouth daily until instructed to taper by your oncologist, Disp: 60 tablet, Rfl: 0   sitaGLIPtin (JANUVIA) 100 MG tablet, Take 1 tablet  (100 mg total) by mouth daily., Disp: 90 tablet, Rfl: 1   SitaGLIPtin-MetFORMIN HCl 902 809 7975 MG TB24, Take 1 tablet by mouth daily., Disp: 90 tablet, Rfl: 1   SYRINGE-NEEDLE, DISP, 3 ML 23G X 1" 3 ML MISC, To inject B12, Disp: 50 each, Rfl: 0   traMADol (ULTRAM) 50 MG tablet, Take 1 tablet (50 mg total) by mouth every 6 (six) hours as needed., Disp: 20 tablet, Rfl: 0   triamcinolone cream (KENALOG) 0.1 %, Apply thin layer to affected area 2 times a day as needed, Disp: 454  g, Rfl: 1   Exam: Current vital signs: BP (!) 142/98   Pulse 95   Resp (!) 100   SpO2 100%  Vital signs in last 24 hours: Pulse Rate:  [31-95] 95 (07/30 1449) Resp:  [100] 100 (07/30 1449) BP: (120-142)/(95-104) 142/98 (07/30 1457) SpO2:  [75 %-100 %] 100 % (07/30 1448) FiO2 (%):  [100 %] 100 % (07/30 1457)  GENERAL: critically ill male  HEENT: - Normocephalic and atraumatic, dry mm, in c-collar  LUNGS - Clear to auscultation bilaterally with no wheezes CV - S1S2 RRR, no m/r/g, equal pulses bilaterally. ABDOMEN - Soft, nontender, nondistended with normoactive BS Ext: warm, well perfused, intact peripheral pulses, no edema  NEURO:  Mental Status: in c-collar. Eyes closed. Does not open eyes to voice or noxious stimuli. Nonverbal, not following commands.  Eyes are midline, I am unable to get him to move his eyes due to c-collar in place. Language: speech nonverbal.   Motor: left arm and leg with some horizontal movements, right arm localizes to pain, withdrawal in the right leg Tone: is normal and bulk is normal Sensation- decreased on left Coordination: Unable to assess  Gait- deferred  NIHSS 1a Level of Conscious.: 2 1b LOC Questions: 2 1c LOC Commands: 2 2 Best Gaze: 0 3 Visual: 3 4 Facial Palsy: 1 5a Motor Arm - left: 3 5b Motor Arm - Right: 2 6a Motor Leg - Left: 3 6b Motor Leg - Right:  7 Limb Ataxia: 0 8 Sensory: 2 9 Best Language: 3 10 Dysarthria: 2 11 Extinct. and Inatten.: 0 TOTAL:  25   Labs I have reviewed labs in epic and the results pertinent to this consultation are:  CBC    Component Value Date/Time   WBC 17.8 (H) 02/11/2023 1430   RBC 4.98 02/11/2023 1430   HGB 16.0 02/11/2023 1439   HCT 47.0 02/11/2023 1439   PLT 321 02/11/2023 1430   MCV 96.6 02/11/2023 1430   MCH 28.7 02/11/2023 1430   MCHC 29.7 (L) 02/11/2023 1430   RDW 14.0 02/11/2023 1430   LYMPHSABS 1.1 07/04/2021 1613   MONOABS 0.9 07/04/2021 1613   EOSABS 0.0 07/04/2021 1613   BASOSABS 0.1 07/04/2021 1613    CMP     Component Value Date/Time   NA 135 02/11/2023 1439   K 3.4 (L) 02/11/2023 1439   CL 100 02/11/2023 1439   CO2 24 07/04/2021 1613   GLUCOSE 162 (H) 02/11/2023 1439   BUN 16 02/11/2023 1439   CREATININE 1.20 02/11/2023 1439   CREATININE 1.10 03/14/2020 0934   CALCIUM 8.5 (L) 07/04/2021 1613   PROT 7.3 07/04/2021 1613   ALBUMIN 4.0 07/04/2021 1613   AST 37 07/04/2021 1613   ALT 42 07/04/2021 1613   ALKPHOS 58 07/04/2021 1613   BILITOT 0.6 07/04/2021 1613   GFRNONAA >60 07/04/2021 1613   GFRNONAA 59 (L) 03/28/2016 0806   GFRAA 58 (L) 05/05/2019 0254   GFRAA 68 03/28/2016 0806    Lipid Panel     Component Value Date/Time   CHOL 143 08/27/2022 1201   TRIG 99.0 08/27/2022 1201   HDL 60.90 08/27/2022 1201   CHOLHDL 2 08/27/2022 1201   VLDL 19.8 08/27/2022 1201   LDLCALC 63 08/27/2022 1201   LDLCALC 58 01/18/2022 1505   LDLDIRECT 143.5 02/01/2011 1028    Lab Results  Component Value Date   HGBA1C 6.6 (H) 08/27/2022      Imaging I have reviewed the images obtained:  CT-head-Abnormal edema in  the right frontal lobe with somewhat thickened and dense adjacent cortex. This could potentially be due to acute/subacute infarct with petechial hemorrhage or underlying lesion  CTA head and neck  -Sulcal hyperdensity on the noncontrast study and conspicuous postcontrast enhancement within the sulci of the right frontal and temporal lobes on the delayed phase images.  Given right parietal scalp hematoma. Findings are favored to reflect subarachnoid hemorrhage and contrast staining with underlying parenchymal edema, with subacute infarct considered less likely.  -Occluded right V2 segment with reconstitution of flow in the V3 segment. Acute dissection is not excluded. -Mixed plaque at the right carotid bifurcation resulting in approximately 50% stenosis.  Assessment:  72 y.o. male with past medical history of HTN, HLD, DM, GERD, anxiety and depression, bladder cancer who presents to [facility] ED via EMS after experiencing a grand mal seizure at home per EMS. LKW 1330. Wife was with him, left the room and came back and found him having a seizure on the floor. EMS called and activated a code stroke.  Recommendations: - Keppra 2gm IV x 1 now - LTM EEG - MRI brain w/wo to evaluate for acute process when able  -Keppra 1 g twice daily  Gevena Mart DNP, ACNPC-AG  [facility]  Was present for the entirety of the evaluation and management reflected in the above note.  He had new onset seizures today.  He has evidence of trauma both with scalp hematoma as well as possibly cerebral contusion, but it is unclear whether his fall precipitated his seizure or whether seizure caused him to fall.  He has been complaining of being off balance for a few days, and it is possible that he fell and then seized.   He has an abnormal head CT, most likely possibility would be a contusion, however subacute stroke would also be a consideration.  I would favor getting an MRI of his brain.   His EEG shows significant irritability consistent with structural lesion and he will need continued antiepileptic therapy.  Continue LTM EEG, MRI brain, neurology to follow.  This patient is critically ill and at significant risk of neurological worsening, death and care requires constant monitoring of vital signs, hemodynamics,respiratory and cardiac monitoring, neurological assessment,  discussion with family, other specialists and medical decision making of high complexity. I spent 60 minutes of neurocritical care time  in the care of  this patient. This was time spent independent of any time provided by nurse practitioner or PA.  Ritta Slot, MD [facility] [phone]  If 7pm- 7am, please page neurology on call as listed in AMION. 02/11/2023  7:33 PM

## 2023-02-11 NOTE — Progress Notes (Signed)
Patient taken to MRI from 4N18 then back to 4N18 on ventilator. VSS no adverse events noted at this time. Patient suctioned prior to and after trip.

## 2023-02-11 NOTE — Progress Notes (Signed)
Pt transported to and from CT without event.  

## 2023-02-11 NOTE — ED Notes (Signed)
Pt placed on transport monitor. Pt being transported to CT 2

## 2023-02-11 NOTE — Procedures (Signed)
[facility] Consult Services  Stat EEG Report  Video Performed: Performed   Demographics: Patient Name:   Charles Trujillo, Charles Trujillo  Date of Birth:   10-02-1950  Identification Number:   MRN - [id number]  Study Times:  Study Start Time:   02/11/2023 16:42:00 Study End Time:   02/11/2023 17:42:00  Duration:   60 minutes  Indication(s): Seizures  Technical Summary:  This EEG was performed utilizing standard International 10-20 System of electrode placement. One channel electrocardiogram was monitored. Data were obtained and interpreted utilizing referential montage recording, with reformatting to longitudinal, transverse bipolar, and referential montages as necessary for interpretation.  State(s):      Coma (not medically induced)   Activation Procedures: Hyperventilation: Not performed Photic Stimulation: Not performed   EEG Description:  Background: A posterior dominant rhythm (PDR) could not be identified. The background consisted predominantly of diffuse polymorphic delta and theta slowing (2-7 Hz). There was additional focal right frontal slowing seen. The background was not reactive to stimulation. There were frequent right frontal sharp waves seen.    Classification: abnormal due to:    1. Diffuse delta and theta slowing.  2. Additional focal right frontal slowing  3. Absence of sleep structures.  4. Frequent right frontal sharp waves seen.  Impression:  This is an abnormal EEG. Generalized theta and delta slowing and absence of sleep structures, as seen in this EEG, are consistent with diffuse cortical dysfunction and severe non-specific encephalopathy. Additional focal theta and delta slowing was seen in the right hemisphere, frontal region, which is consistent with an additional area of neuronal dysfunction and/or age-indeterminate injury and/or lesion in that region. Frequent sharp waves in the right frontal region were seen, which  is consistent with an area of focal epileptic potential in that region. No seizures were seen during the period of time covered by this EEG.        Murvin Natal, MD  ABPN Board Certified: Neurology  [facility] Board Certified: Clinical Neurophysiology  [facility] Board Certified: Epilepsy          [facility] For Inpatient follow-up with [facility] physician please call [facility] [phone]. This is not an outpatient service. Post hospital discharge, please contact hospital directly.   Please call or reconsult our service if there are any clinical or diagnostic changes.

## 2023-02-11 NOTE — Progress Notes (Incomplete)
02/11/2023 Seen for encephalopathy culminating in resp failure. Found by wife on floor having seizures with abnormal neuro exams between Trauma scan neg Did not wake up in ER so intubated On exam, sedated, pupils equal and brisk Lungs clear, normal lung compliance on vent  ABG hyperoxic K slightly low WBC slightly up ETT a little low on CXR Continue vent support while seizure workup ongoing Currently out of options for his metastatic bladder cancer but disease burden low (See Dr. Clarene Duke note 01/29/23 in care everywhere) Patient is DNR  My cc time 15 mins

## 2023-02-11 NOTE — Progress Notes (Signed)
LTM EEG hooked up and running - no initial skin breakdown - push button tested - [facility] monitoring.  

## 2023-02-11 NOTE — ED Triage Notes (Signed)
Pt arrived via EMS from home as CODE STROKE LKW 1330. EMS reports pt's wife found pt on the floor having a grand mal sz. He was then postictal upon EMS arrival. Pt had witnessed focal sz with facial features. After sz, EMS reports Pt had R gaze, R side flaccid, garbled speech. Pt then had another grand mal sz. EMS gave total of 2.5mg  Versed. Initial BP 200/102, HR 80's, 98% on 4L, CBG 251. Last BP prior to arrival 130/76. EMS also reports posterior hematoma. 20g R hand and 18g L AC.

## 2023-02-11 NOTE — H&P (Signed)
NAME:  Charles Trujillo, MRN:  [id number], DOB:  06/03/1951, LOS: 0 ADMISSION DATE:  02/11/2023, CONSULTATION DATE:  02/11/23  REFERRING MD:  ED, CHIEF COMPLAINT:  AMS   History of Present Illness:  Charles Trujillo is a 72 year old male with past medical history notable for hypertension, hyperlipidemia, DM, GERD, anxiety and depression, bladder cancer.  Patient presented to [facility] emergency department today 02/11/2023 via EMS after experiencing a grand mal seizure at home per EMS.  Last known well was at 1:30 PM today.  Wife is with him and left the room and when she came back she found him having a seizure on the floor.  She had called EMS and they activated a code stroke at that time.  Initially on EMS evaluation patient was found hypertensive 200s/100s and hypoglycemic to 51.  Per EMS en route to hospital he had a focal seizure involving his mouth and then had a grand mal seizure he was given 2.5 mg Versed.  In the emergency department CT head showed to be abnormal edema in the right frontal lobe with thickened and dense adjacent cortex.  Could potentially be due to acute/subacute infarct with petechial hemorrhage or underlying lesion.  Patient required intubation after CT for airway protection.  Also was given 2 g IV Keppra and stat EEG ordered.  CTA head and neck with no LVO.  PCCM was consulted at that time for admission  Pertinent  Medical History  hypertension, hyperlipidemia, DM, GERD, anxiety and depression, bladder cancer  Significant Hospital Events: Including procedures, antibiotic start and stop dates in addition to other pertinent events   7/30- admitted to ICU, grand mal seizure and CT head with ? subacute/acute infarct. Intubated for airway protection. MRI pending  Interim History / Subjective:  Intubated in ED  Objective   Blood pressure 107/76, pulse 77, temperature 98.6 F (37 C), resp. rate 18, height 5\' 7"  (1.702 m), weight 86 kg, SpO2 98%.    Vent Mode:  PRVC FiO2 (%):  [40 %-100 %] 40 % Set Rate:  [18 bmp] 18 bmp Vt Set:  [520 mL] 520 mL PEEP:  [5 cmH20] 5 cmH20   Intake/Output Summary (Last 24 hours) at 02/11/2023 1610 Last data filed at 02/11/2023 1604 Gross per 24 hour  Intake 1013.86 ml  Output --  Net 1013.86 ml   Filed Weights   02/11/23 1552  Weight: 86 kg    Examination: Physical Exam HENT:     Mouth/Throat:     Comments: ETT in place Eyes:     Pupils: Pupils are equal, round, and reactive to light.  Cardiovascular:     Rate and Rhythm: Normal rate and regular rhythm.     Pulses: Normal pulses.     Heart sounds: Normal heart sounds.  Pulmonary:     Effort: Pulmonary effort is normal.     Breath sounds: Normal breath sounds.  Abdominal:     General: Bowel sounds are normal.     Palpations: Abdomen is soft.  Musculoskeletal:     Right lower leg: No edema.     Left lower leg: No edema.  Skin:    General: Skin is warm.     Capillary Refill: Capillary refill takes less than 2 seconds.  Neurological:     Mental Status: Mental status is at baseline.     Comments: Not following commands, does not withdraw to noxious stimuli, however just intubated and on propofol at time or presentation  Psychiatric:  Mood and Affect: Mood normal.      Resolved Hospital Problem list     Assessment & Plan:    Status Epilepticus Pt notably without previous history of seizures.  -Keppra 2g IV given in ED- continue with Keppra 500mg  BID IV- defer further AED management to neurology -STAT EEG ordered -Neuro following, appreciate recommendations   ?Acute CVA CT code stroke showed abnormal edema in R frontal lobe with thickened and dense adjacent cortex- could reflect acute/subacute infarct with petechial hemorrhage or underlying lesion such as metastatic disease secondary to bladder cancer. EMS stated pt L side was flaccid on arrival -MRI pending w/wo contrast to evaluate for acute process when able -HGB A1c, fasting  lipid panel pending -Echocardiogram pending -Neuro following  -eventually pt will benefit from PT, OT, ST when appropriate   Acute hypoxic respiratory failure Intubated for airway protection post status epilepticus -Maintain full vent support with SAT/SBT as tolerated -titrate Vent setting to maintain SpO2 greater than or equal to 90%. -HOB elevated 30 degrees. -Plateau pressures less than 30 cm H20.  -Follow chest x-ray, ABG prn.   -Bronchial hygiene and RT/bronchodilator protocol. -Goal RASS 0 to -1, fentanyl PRN and propofol ordered for analgesia per PAD protocol   Bladder cancer with concern for metastatic disease  Pt notably follows with Dr Thana Ates of oncology with [facility]- last seen in office 6/19. CT chest ABD Pelvis showed enlarging retroperitoneal lymphadenopathy concerning for metastatic disease. At that time pt was recommended hospice. Per chart review he was in agreement with hospice however was looking for a second opinion.  -DNR per discussion with pt wife -MRI brain pending to rule out mets to brain vs acute CVA as above   AKI Hypokalemia Baseline serum creatinine appears to be around 0.9 to 1.0. arrived to hospital with cr 1.46. likely prerenal in setting of status epilepticus vs contrast induced for CT. Serum K+ 3.4 -replaced with IV potassium and recheck K+ with AM labs -avoid nephrotoxic agents -renally dose medications -ensure renal perfusion -IVF for hydration- LR @ 99ml/hr for 16 hours -continue to monitor RF with AM labs    Best Practice (right click and "Reselect all SmartList Selections" daily)   Diet/type: NPO DVT prophylaxis: prophylactic heparin  GI prophylaxis: H2B Lines: N/A Foley:  N/A Code Status:  DNR Last date of multidisciplinary goals of care discussion [02/11/23 ]  Labs   CBC: Recent Labs  Lab 02/11/23 1430 02/11/23 1439 02/11/23 1548  WBC 17.8*  --   --   NEUTROABS 10.9*  --   --   HGB 14.3 16.0 13.9  HCT 48.1  47.0 41.0  MCV 96.6  --   --   PLT 321  --   --     Basic Metabolic Panel: Recent Labs  Lab 02/11/23 1430 02/11/23 1439 02/11/23 1548  NA 136 135 135  K 3.4* 3.4* 3.5  CL 98 100  --   CO2 14*  --   --   GLUCOSE 171* 162*  --   BUN 16 16  --   CREATININE 1.46* 1.20  --   CALCIUM 9.0  --   --    GFR: Estimated Creatinine Clearance: 57.5 mL/min (by C-G formula based on SCr of 1.2 mg/dL). Recent Labs  Lab 02/11/23 1430  WBC 17.8*    Liver Function Tests: Recent Labs  Lab 02/11/23 1430  AST 56*  ALT 42  ALKPHOS 61  BILITOT 0.7  PROT 7.3  ALBUMIN 3.9  No results for input(s): "LIPASE", "AMYLASE" in the last 168 hours. No results for input(s): "AMMONIA" in the last 168 hours.  ABG    Component Value Date/Time   PHART 7.397 02/11/2023 1548   PCO2ART 34.2 02/11/2023 1548   PO2ART 396 (H) 02/11/2023 1548   HCO3 21.1 02/11/2023 1548   TCO2 22 02/11/2023 1548   ACIDBASEDEF 3.0 (H) 02/11/2023 1548   O2SAT 100 02/11/2023 1548     Coagulation Profile: Recent Labs  Lab 02/11/23 1430  INR 1.3*    Cardiac Enzymes: No results for input(s): "CKTOTAL", "CKMB", "CKMBINDEX", "TROPONINI" in the last 168 hours.  HbA1C: Hgb A1c MFr Bld  Date/Time Value Ref Range Status  08/27/2022 12:01 PM 6.6 (H) 4.6 - 6.5 % Final    Comment:    Glycemic Control Guidelines for People with Diabetes:Non Diabetic:  <6%Goal of Therapy: <7%Additional Action Suggested:  >8%   01/18/2022 03:05 PM 7.3 (H) <5.7 % of total Hgb Final    Comment:    For someone without known diabetes, a hemoglobin A1c value of 6.5% or greater indicates that they may have  diabetes and this should be confirmed with a follow-up  test. . For someone with known diabetes, a value <7% indicates  that their diabetes is well controlled and a value  greater than or equal to 7% indicates suboptimal  control. A1c targets should be individualized based on  duration of diabetes, age, comorbid conditions, and  other  considerations. . Currently, no consensus exists regarding use of hemoglobin A1c for diagnosis of diabetes for children. .     CBG: Recent Labs  Lab 02/11/23 1431  GLUCAP 160*    Review of Systems:   Negative except as listed in HPI and POC.  Past Medical History:  He,  has a past medical history of Anxiety, Arthritis, Depression, DM2 (diabetes mellitus, type 2) (HCC), Dysrhythmia, Elevated LFTs, GERD (gastroesophageal reflux disease), HLD (hyperlipidemia), Hypertension, Inguinal hernia, and Squamous cell carcinoma.   Surgical History:   Past Surgical History:  Procedure Laterality Date   ACHILLES TENDON REPAIR  1999   BLADDER SURGERY     CARPAL TUNNEL RELEASE Right 11/04/2014   Procedure: LIMITED OPEN RIGHT CARPAL TUNNEL RELEASE;  Surgeon: Dominica Severin, MD;  Location: [facility];  Service: Orthopedics;  Laterality: Right;   CARPAL TUNNEL WITH CUBITAL TUNNEL Left 07/22/2014   Procedure: LEFT CARPAL TUNNEL RELEASE AND CUBITAL TUNNEL RELEASE TRANSPOSITION;  Surgeon: Dominica Severin, MD;  Location: [facility];  Service: Orthopedics;  Laterality: Left;   COLONOSCOPY     EYE SURGERY     laser eye surgery bilat    HERNIA REPAIR  07/2011   left side   INGUINAL HERNIA REPAIR  08/09/2011   Procedure: LAPAROSCOPIC INGUINAL HERNIA;  Surgeon: Clovis Pu. Cornett, MD;  Location: [facility];  Service: General;  Laterality: Right;   KNEE ARTHROSCOPY  1993   left knee   KNEE SURGERY     age 55- right   SQUAMOUS CELL CARCINOMA EXCISION     left knee   TOTAL KNEE ARTHROPLASTY Left 05/04/2019   Procedure: LEFT TOTAL KNEE ARTHROPLASTY;  Surgeon: Valeria Batman, MD;  Location: [facility];  Service: Orthopedics;  Laterality: Left;   TOTAL KNEE ARTHROPLASTY Right 05/09/2020   Procedure: RIGHT TOTAL KNEE ARTHROPLASTY;  Surgeon: Valeria Batman, MD;  Location: [facility];  Service: Orthopedics;  Laterality: Right;     Social History:   reports that he has been smoking  cigars. He has never used smokeless tobacco. He reports current alcohol use of about 3.0 - 4.0 standard drinks of alcohol per week. He reports that he does not use drugs.   Family History:  His family history includes Alcohol abuse in his brother; Cancer in his maternal grandmother; Depression in his brother; Diabetes in his mother; Heart disease in his mother; Heart failure in his mother; Hypertension in his mother; Parkinsonism in his father; Stroke (age of onset: 19) in his father. There is no history of Colon cancer, Colon polyps, Esophageal cancer, Rectal cancer, or Stomach cancer.   Allergies Allergies  Allergen Reactions   Meth-Hyo-M Bl-Na Phos-Ph Sal Other (See Comments)    [facility] believes it may have contributed to renal failure accourding to his spouse who informed us at 17:17 01/19/2021   Codeine Nausea Only     Home Medications  Prior to Admission medications   Medication Sig Start Date End Date Taking? Authorizing Provider  amoxicillin (AMOXIL) 500 MG capsule Take 4 capsules by mouth 1 hour prior to dental procedures 12/03/22   Zola Button, Grayling Congress, DO  atorvastatin (LIPITOR) 40 MG tablet Take 1 tablet (40 mg total) by mouth daily. 11/11/22   Seabron Spates R, DO  blood glucose meter kit and supplies KIT Dispense based on patient and insurance preference. Use up to four times daily as directed. 02/23/21   Donato Schultz, DO  Cholecalciferol (VITAMIN D3) 50 MCG (2000 UT) capsule Take 2,000 Units by mouth daily.    [provider]  cyanocobalamin (VITAMIN B12) 1000 MCG/ML injection Inject 1mL into muscle once weekly for 4 weeks, then 1mL injected into muscle once monthly there after for 5 months. 07/11/21   Saguier, Ramon Dredge, PA-C  dexamethasone (DECADRON) 4 MG tablet Take 1 tablet (4 mg total) by mouth daily with breakfast for 4 days. 02/10/23     diltiazem (CARDIZEM CD) 120 MG 24 hr capsule Take 1 capsule (120 mg total) by mouth daily. 11/11/22   Donato Schultz, DO  DULoxetine (CYMBALTA) 20 MG capsule Take 1 capsule (20 mg total) by mouth daily. 09/13/22   Donato Schultz, DO  gabapentin (NEURONTIN) 100 MG capsule Take 2 capsules (200 mg total) by mouth at bedtime. 01/24/23   Judi Saa, DO  glucose blood test strip Check blood sugars twice daily 11/15/21   Zola Button, Grayling Congress, DO  HYDROcodone-acetaminophen (NORCO) 5-325 MG tablet Take 1 tablet by mouth every 4 (four) hours as needed for moderate pain or severe pain. 07/04/21   Mancel Bale, MD  Insulin Pen Needle 31G X 5 MM MISC As directed 11/05/21   Zola Button, Grayling Congress, DO  INV-pembrolizumab [phone]) 100 MG/4ML SOLN Inject into the vein.    [provider]  Lancets [facility] ULTRASOFT) lancets Check blood sugars twice daily 11/15/21   Zola Button, Grayling Congress, DO  metFORMIN (GLUCOPHAGE) 1000 MG tablet Take 1 tablet (1,000 mg total) by mouth 2 (two) times daily with a meal. 09/20/22   Zola Button, Grayling Congress, DO  metoprolol tartrate (LOPRESSOR) 50 MG tablet Take 1 tablet (50 mg total) by mouth 2 (two) times daily. 07/18/22 07/18/23  Donato Schultz, DO  NEEDLE, DISP, 25 G (B-D DISP NEEDLE 25GX1") 25G X 1" MISC To use w/ B12 injections 07/11/21   Saguier, Ramon Dredge, PA-C  nystatin-triamcinolone ointment [facility]) Apply 1 application topically 2 (two) times daily. 11/17/19   Iva Boop, MD  ondansetron [facility]) 8  MG tablet Take 1 tablet (8 mg total) by mouth every 8 (eight) hours as needed for nausea. 08/29/22     predniSONE (DELTASONE) 20 MG tablet Take 4 tablets by mouth daily until instructed to taper by your oncologist 11/05/21     sitaGLIPtin (JANUVIA) 100 MG tablet Take 1 tablet (100 mg total) by mouth daily. 09/26/22   Seabron Spates R, DO  SitaGLIPtin-MetFORMIN HCl [phone] MG TB24 Take 1 tablet by mouth daily. 09/20/22   Seabron Spates R, DO  SYRINGE-NEEDLE, DISP, 3 ML 23G X 1" 3 ML MISC To inject B12 07/11/21   Saguier, Ramon Dredge, PA-C  traMADol (ULTRAM) 50 MG tablet  Take 1 tablet (50 mg total) by mouth every 6 (six) hours as needed. 12/02/21   Judi Saa, DO  triamcinolone cream (KENALOG) 0.1 % Apply thin layer to affected area 2 times a day as needed 10/30/21        Critical care time: 35 minutes    Janeece Riggers, AGACNP-BC [facility] For pager details, please see AMION or use EPIC chat After 1900, please call [facility] ([place]) for cross coverage needs 02/11/2023 4:10 PM

## 2023-02-11 NOTE — ED Notes (Signed)
Unable to obtain axillary temp at this time

## 2023-02-11 NOTE — Code Documentation (Signed)
Pt on 15L ambu bag. Time out performed

## 2023-02-11 NOTE — Progress Notes (Signed)
Patient transported from ED to room 4N18, on ventilator, without complications.

## 2023-02-11 NOTE — Progress Notes (Signed)
Responded to support pt. That experienced Code Stroke.  Venida Jarvis, [place], [facility], Pager [phone]

## 2023-02-12 DIAGNOSIS — G40901 Epilepsy, unspecified, not intractable, with status epilepticus: Secondary | ICD-10-CM

## 2023-02-12 DIAGNOSIS — R404 Transient alteration of awareness: Secondary | ICD-10-CM

## 2023-02-12 MED ORDER — ACETAMINOPHEN 325 MG PO TABS
650.0000 mg | ORAL_TABLET | Freq: Once | ORAL | Status: AC
  Administered 2023-02-12: 650 mg
  Filled 2023-02-12: qty 2

## 2023-02-12 MED ORDER — DEXAMETHASONE SODIUM PHOSPHATE 10 MG/ML IJ SOLN
10.0000 mg | INTRAMUSCULAR | Status: AC
  Administered 2023-02-12 – 2023-02-14 (×3): 10 mg via INTRAVENOUS
  Filled 2023-02-12 (×3): qty 1

## 2023-02-12 MED ORDER — MAGNESIUM SULFATE 2 GM/50ML IV SOLN
2.0000 g | Freq: Once | INTRAVENOUS | Status: AC
  Administered 2023-02-12: 2 g via INTRAVENOUS
  Filled 2023-02-12: qty 50

## 2023-02-12 MED ORDER — LEVETIRACETAM IN NACL 1500 MG/100ML IV SOLN
1500.0000 mg | Freq: Two times a day (BID) | INTRAVENOUS | Status: DC
  Administered 2023-02-12 – 2023-02-16 (×9): 1500 mg via INTRAVENOUS
  Filled 2023-02-12 (×9): qty 100

## 2023-02-12 MED ORDER — LEVETIRACETAM IN NACL 1000 MG/100ML IV SOLN
1000.0000 mg | Freq: Once | INTRAVENOUS | Status: AC
  Administered 2023-02-12: 1000 mg via INTRAVENOUS
  Filled 2023-02-12: qty 100

## 2023-02-12 NOTE — ED Provider Notes (Signed)
[facility] NEURO/TRAUMA/SURGICAL ICU Provider Note   CSN: [id number] Arrival date & time: 02/11/23  1430     History  Chief Complaint  Patient presents with   Code Stroke    Charles Trujillo is a 72 y.o. male.  HPI     72yo male with history of depression, DM2, htn, hlpd, bladder cancer who presents as a CODE STROKE with new onset seizures.  Wife states he has fatigue, has been out of treatment options for his bladder cancer.  No recent fever, chills, cough, chest pain, dyspnea, headaches. Seemed to be in a normal state of health, last saw him 20 minutes before, was working in the other room and heard a thud and found him on the floor with seizure.  EMS was called, initially they report he was sleepy but alert, following commands, had left sided weakness, gaze deviation.  CODE stroke called then he had another focal seizure followed by grand mal seizure and given 2.5mg  versed.    Past Medical History:  Diagnosis Date   Anxiety    Arthritis    bilateral knees   Depression    DM2 (diabetes mellitus, type 2) (HCC)    Dysrhythmia     benign tachycardia, takes Cartia   Elevated LFTs    previously evaluated by Dr Jarold Motto; h/o "fatty liver"   GERD (gastroesophageal reflux disease)    HLD (hyperlipidemia)    Hypertension    Inguinal hernia    right   Squamous cell carcinoma    top of head    Home Medications Prior to Admission medications   Medication Sig Start Date End Date Taking? Authorizing Provider  acetaminophen (TYLENOL) 650 MG CR tablet Take 650 mg by mouth 2 (two) times daily.   Yes [provider]  amoxicillin (AMOXIL) 500 MG capsule Take 4 capsules by mouth 1 hour prior to dental procedures 12/03/22  Yes Seabron Spates R, DO  atorvastatin (LIPITOR) 40 MG tablet Take 1 tablet (40 mg total) by mouth daily. 11/11/22  Yes Seabron Spates R, DO  Cholecalciferol (VITAMIN D-3 PO) Take 1 capsule by mouth daily.   Yes [provider]   diltiazem (CARDIZEM CD) 120 MG 24 hr capsule Take 1 capsule (120 mg total) by mouth daily. 11/11/22  Yes Seabron Spates R, DO  DULoxetine (CYMBALTA) 20 MG capsule Take 1 capsule (20 mg total) by mouth daily. 09/13/22  Yes Seabron Spates R, DO  gabapentin (NEURONTIN) 100 MG capsule Take 2 capsules (200 mg total) by mouth at bedtime. 01/24/23  Yes Judi Saa, DO  metoprolol tartrate (LOPRESSOR) 50 MG tablet Take 1 tablet (50 mg total) by mouth 2 (two) times daily. 07/18/22 07/18/23 Yes Lowne Florina Ou R, DO  ondansetron (ZOFRAN) 8 MG tablet Take 1 tablet (8 mg total) by mouth every 8 (eight) hours as needed for nausea. 08/29/22  Yes   SitaGLIPtin-MetFORMIN HCl [phone] MG TB24 Take 1 tablet by mouth daily. 09/20/22  Yes Seabron Spates R, DO  dexamethasone (DECADRON) 4 MG tablet Take 1 tablet (4 mg total) by mouth daily with breakfast for 4 days. Patient not taking: Reported on 02/12/2023 02/10/23         Allergies    Meth-hyo-m bl-na phos-ph sal and Codeine    Review of Systems   Review of Systems  Physical Exam Updated Vital Signs BP 131/76   Pulse 79   Temp 99 F (37.2 C)   Resp 18   Ht 5'  7" (1.702 m)   Wt 86 kg   SpO2 97%   BMI 29.69 kg/m  Physical Exam Vitals and nursing note reviewed.  Constitutional:      General: He is in acute distress.     Appearance: He is well-developed. He is ill-appearing. He is not toxic-appearing or diaphoretic.  HENT:     Head: Normocephalic.     Comments: Large right posterior hematoma  Eyes:     Conjunctiva/sclera: Conjunctivae normal.  Cardiovascular:     Rate and Rhythm: Normal rate and regular rhythm.     Pulses: Normal pulses.     Heart sounds: Normal heart sounds. No murmur heard.    No friction rub. No gallop.  Pulmonary:     Effort: Pulmonary effort is normal. No respiratory distress.     Breath sounds: Rhonchi present. No wheezing or rales.  Abdominal:     General: There is no distension.     Palpations: Abdomen  is soft.     Tenderness: There is no abdominal tenderness. There is no guarding.  Musculoskeletal:        General: No deformity or signs of injury.     Cervical back: Normal range of motion. No rigidity.  Skin:    General: Skin is warm and dry.     Coloration: Skin is not jaundiced or pale.  Neurological:     Mental Status: He is lethargic.     GCS: GCS eye subscore is 2. GCS verbal subscore is 1. GCS motor subscore is 5.     ED Results / Procedures / Treatments   Labs (all labs ordered are listed, but only abnormal results are displayed) Labs Reviewed  PROTIME-INR - Abnormal; Notable for the following components:      Result Value   Prothrombin Time 16.3 (*)    INR 1.3 (*)    All other components within normal limits  APTT - Abnormal; Notable for the following components:   aPTT 22 (*)    All other components within normal limits  CBC - Abnormal; Notable for the following components:   WBC 17.8 (*)    MCHC 29.7 (*)    All other components within normal limits  DIFFERENTIAL - Abnormal; Notable for the following components:   Neutro Abs 10.9 (*)    Lymphs Abs 5.2 (*)    Monocytes Absolute 1.6 (*)    All other components within normal limits  COMPREHENSIVE METABOLIC PANEL - Abnormal; Notable for the following components:   Potassium 3.4 (*)    CO2 14 (*)    Glucose, Bld 171 (*)    Creatinine, Ser 1.46 (*)    AST 56 (*)    GFR, Estimated 50 (*)    Anion gap 24 (*)    All other components within normal limits  RAPID URINE DRUG SCREEN, HOSP PERFORMED - Abnormal; Notable for the following components:   Benzodiazepines POSITIVE (*)    All other components within normal limits  URINALYSIS, ROUTINE W REFLEX MICROSCOPIC - Abnormal; Notable for the following components:   Specific Gravity, Urine 1.039 (*)    Hgb urine dipstick SMALL (*)    Protein, ur 30 (*)    Bacteria, UA RARE (*)    All other components within normal limits  BASIC METABOLIC PANEL - Abnormal; Notable for  the following components:   Sodium 134 (*)    CO2 20 (*)    Glucose, Bld 108 (*)    Calcium 8.5 (*)    All  other components within normal limits  CBG MONITORING, ED - Abnormal; Notable for the following components:   Glucose-Capillary 160 (*)    All other components within normal limits  I-STAT CHEM 8, ED - Abnormal; Notable for the following components:   Potassium 3.4 (*)    Glucose, Bld 162 (*)    Calcium, Ion 1.05 (*)    TCO2 14 (*)    All other components within normal limits  I-STAT ARTERIAL BLOOD GAS, ED - Abnormal; Notable for the following components:   pO2, Arterial 396 (*)    Acid-base deficit 3.0 (*)    All other components within normal limits  MRSA NEXT GEN BY PCR, NASAL  ETHANOL  CBC  MAGNESIUM  PHOSPHORUS  BLOOD GAS, ARTERIAL    EKG EKG Interpretation Date/Time:  Tuesday February 11 2023 15:53:48 EDT Ventricular Rate:  79 PR Interval:  168 QRS Duration:  86 QT Interval:  377 QTC Calculation: 433 R Axis:   52  Text Interpretation: Sinus rhythm Low voltage, precordial leads RSR' in V1 or V2, right VCD or RVH Although rate has decreased No significant change was found Confirmed by Elayne Snare ([id number]) on 02/12/2023 8:31:38 AM  Radiology Overnight EEG with video  Result Date: 02/12/2023 Reece Levy, MD     02/12/2023  8:08 PM [facility] Long-term EEG Report Video Performed: Performed Demographics: Patient Name:   Diamantina Providence Date of Birth:   September 26, 1950 Identification Number:   MRN - [id number] Study Times: Study Start Time:   02/12/2023 02:00:00 Study End Time:   02/12/2023 14:00:00 Indication(s): Seizures, Encephalopathy Medication: Keppra, fentanyl Technical Summary: This EEG was performed utilizing standard International 10-20 System of electrode placement. One channel electrocardiogram was monitored. Data were obtained and interpreted utilizing referential montage recording, with reformatting to  longitudinal, transverse bipolar, and referential montages as necessary for interpretation. State(s):      Medically Induced Coma Activation Procedures: Hyperventilation: Not performed Photic Stimulation: Not performed EEG Description: EEG background showed an abasent of posterior dominant alpha rhythm. There was background slowing in delta and theta range.  There was a continuous focal slowing with polymorphic delta in the right frontal region.  Frequent spikes and polyspikes were seen waxing and waning with frequency up to 2.0Hz  over the right frontal region. There was no clear EEG evolution to electrographic seizure.  There was no clinical event or button-pushed event noted.  Physiologic sleep stage II sleep spindle and vertex waves were not recorded.  Impression: This is an abnormal EEG showing:  1. Potential epileptogenic focus in the right frontal region in the category of ictal-interictal continuum with high risk of progressing into electrographic seizure  2. Focal slowing suggestive of underlying structural abnormality and/or cortical dysfunction in the right frontal region  3. Severe diffuse encephalopathy  The study is ongoing. This is unchanged from prior 12-hr record. Dr Judithann Sheen Chitravas This long-term video EEG is performed and recorded for review by an interpreting physician periodically and is not live continuous monitoring. Reviewing physicians have the ability to access live data at any time during the study if requested. Studies must be automatically or manually uploaded by the performing hospital every 12 hours for review. If the patient experiences clinical changes that may be related to seizure activity, immediately contact the [facility]) 24 hours a day, 7 days a week for a stat EEG review/interpretation. [facility] Contact Number: [phone]. [facility] For Inpatient follow-up with [facility] physician please call [facility] [phone]. This  is not an outpatient service. Post hospital discharge, please contact hospital directly. Please call or reconsult our service if there are any clinical or diagnostic changes.  MR BRAIN W WO CONTRAST  Result Date: 02/12/2023 CLINICAL DATA:  Initial evaluation for new onset seizure. EXAM: MRI HEAD WITHOUT AND WITH CONTRAST TECHNIQUE: Multiplanar, multiecho pulse sequences of the brain and surrounding structures were obtained without and with intravenous contrast. CONTRAST:  8.51mL GADAVIST GADOBUTROL 1 MMOL/ML IV SOLN COMPARISON:  Prior CT from earlier the same day. FINDINGS: Brain: Cerebral volume within normal limits for age. Mild chronic microvascular ischemic disease noted involving the periventricular white matter. No evidence for acute or subacute ischemia. No areas of chronic cortical infarction. No significant acute or chronic intracranial blood products. Abnormal leptomeningeal thickening and enhancement seen involving the anterior right frontal and temporal lobes. Extension to partially involve the right sylvian fissure. Associated gyral swelling and edema within the underlying brain parenchyma, corresponding with abnormality on prior CT. Multiple additional scattered subcentimeter enhancing lesions are seen involving the right greater than left cerebral hemispheres, several of which are punctate in nature. The largest of these additional lesions measures 7 mm at the right precentral gyrus (series 22, image 46). Additionally, there is abnormal leptomeningeal enhancement surrounding the brainstem, with extension to involve the left greater than right cerebellum at the level of the CP angle cisterns. Involvement of the IAC's bilaterally noted. Few additional subcentimeter parenchymal lesion seen within the cerebellum, largest of which measures 7 mm at the peripheral right cerebellum (series 22, image 15). 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 No significant associated hemorrhage. No other significant edema other than the aforementioned changes at the right frontotemporal region. No other mass lesion or midline shift. No hydrocephalus or extra-axial collection. Pituitary gland suprasellar region within normal limits. No other intrinsic temporal lobe abnormality. Vascular: Irregular flow void within the intradural right V4 segment, consistent with slow flow as seen on prior CTA. Major intracranial vascular flow voids are otherwise maintained. Skull and upper cervical spine: Craniocervical junction within normal limits. Bone marrow signal intensity within normal limits. No visible focal marrow replacing lesion. Evolving right parieto-occipital scalp contusion noted. Sinuses/Orbits: Globes and orbital soft tissues within normal limits. Trace layering secretions noted within the right maxillary sinus. Mastoid air cells are largely clear. Patient is intubated. Other: None. IMPRESSION: 1. Abnormal leptomeningeal thickening and enhancement involving the anterior right frontotemporal region with associated gyral swelling and edema within the underlying brain parenchyma, corresponding with abnormality on prior CT. Additional leptomeningeal enhancement about the brainstem and cerebellum, with multiple additional scattered subcentimeter enhancing parenchymal lesions involving the right greater than left cerebral hemispheres and cerebellum. 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 2. No other acute intracranial abnormality. 3. Evolving right parieto-occipital scalp contusion. Electronically Signed   By: Rise Mu M.D.   On: 02/12/2023 05:04   CT C-SPINE NO CHARGE  Result Date: 02/11/2023 CLINICAL DATA:  Code stroke. EXAM: CT CERVICAL SPINE WITHOUT CONTRAST TECHNIQUE: Multidetector CT imaging of the cervical spine was performed without intravenous contrast. Multiplanar CT  image reconstructions were also generated. RADIATION DOSE REDUCTION: This exam was performed according to the departmental dose-optimization program which includes automated exposure control, adjustment of the mA and/or kV according to patient size and/or use of iterative reconstruction technique. COMPARISON:  None Available. FINDINGS: Alignment: No traumatic malalignment.  Mild anterolisthesis at C2-3. Skull base and vertebrae: No acute fracture. No primary bone lesion or  focal pathologic process. Soft tissues and spinal canal: No prevertebral fluid or swelling. No visible canal hematoma. Disc levels: Generalized disc collapse with endplate and facet spurring. Generalized foraminal narrowing. Upper chest: Clear apical lungs. IMPRESSION: No evidence of acute injury to the cervical spine. Electronically Signed   By: Tiburcio Pea M.D.   On: 02/11/2023 17:05   CT ANGIO HEAD NECK W WO CM (CODE STROKE)  Result Date: 02/11/2023 CLINICAL DATA:  Code stroke EXAM: CT ANGIOGRAPHY HEAD AND NECK WITH AND WITHOUT CONTRAST TECHNIQUE: Multidetector CT imaging of the head and neck was performed using the standard protocol during bolus administration of intravenous contrast. Multiplanar CT image reconstructions and MIPs were obtained to evaluate the vascular anatomy. Carotid stenosis measurements (when applicable) are obtained utilizing NASCET criteria, using the distal internal carotid diameter as the denominator. RADIATION DOSE REDUCTION: This exam was performed according to the departmental dose-optimization program which includes automated exposure control, adjustment of the mA and/or kV according to patient size and/or use of iterative reconstruction technique. CONTRAST:  75mL OMNIPAQUE IOHEXOL 350 MG/ML SOLN COMPARISON:  Same-day noncontrast CT head FINDINGS: CTA NECK FINDINGS Aortic arch: There is a minimal calcified plaque in the imaged aortic arch. The origins of the major branch vessels are patent. The subclavian  arteries are patent to the level imaged. Right carotid system: The right common carotid artery is patent. There is mixed plaque at the bifurcation and in the proximal internal carotid artery resulting in up to approximately 50% stenosis the distal internal carotid artery is tortuous but widely patent. The external carotid artery is patent. There is no evidence of dissection or aneurysm. Left carotid system: The left common, internal, and external carotid arteries are patent, with mild plaque at the bifurcation but no hemodynamically significant stenosis or occlusion. There is no evidence of dissection or aneurysm. Vertebral arteries: The right V1 and proximal V2 segments are patent. There is occlusion of the vertebral artery throughout the mid and distal V2 segment with reconstitution of flow in the V3 segment. The left vertebral artery is patent throughout. Skeleton: There is advanced degenerative change throughout the cervical spine. There is no visible canal hematoma. There is no acute osseous abnormality or suspicious osseous lesion. Other neck: The endotracheal tube tip terminates in the midthoracic trachea. There is left supraclavicular lymphadenopathy with largest conglomerate measuring up to 1.6 cm x 3.3 cm. Upper chest: The imaged lung apices are clear. Review of the MIP images confirms the above findings CTA HEAD FINDINGS Anterior circulation: There is calcified plaque in the intracranial ICAs without greater than mild stenosis. The bilateral MCAs are patent, without proximal stenosis or occlusion. The bilateral ACAS are patent, without proximal stenosis or occlusion. The anterior communicating artery is not definitely seen. There is ill-defined cortical hyperdensity over the operculum on the arterial phase images which is more conspicuously within the sulci of the frontal and temporal lobes on the more delayed phase images, most suspicious for contrast staining. There is no aneurysm or AVM. Posterior  circulation: The bilateral V4 segments are patent, left dominant. The basilar artery is patent. The PICA origins are not well seen. The other major cerebellar arteries appear patent. The bilateral PCAs are patent, without proximal stenosis or occlusion. The posterior communicating arteries are not definitely seen. There is no aneurysm or AVM. Venous sinuses: Patent. Anatomic variants: None. Review of the MIP images confirms the above findings IMPRESSION: 1. Sulcal hyperdensity on the noncontrast study and conspicuous postcontrast enhancement within the sulci of the  right frontal and temporal lobes on the delayed phase images. Given right parietal scalp hematoma. Findings are favored to reflect subarachnoid hemorrhage and contrast staining with underlying parenchymal edema, with subacute infarct considered less likely. 2. Occluded right V2 segment with reconstitution of flow in the V3 segment. Acute dissection is not excluded. 3. Mixed plaque at the right carotid bifurcation resulting in approximately 50% stenosis. 4. No other hemodynamically significant stenosis or occlusion in the vasculature of the head or neck. 5. Left supraclavicular lymphadenopathy is suspicious for malignancy. Consider imaging of the chest/abdomen/pelvis when clinically appropriate. The acute findings called by telephone at the time of interpretation on 02/11/2023 at 3:42 pm to provider Dr Amada Jupiter, who verbally acknowledged these results. Electronically Signed   By: Lesia Hausen M.D.   On: 02/11/2023 15:55   DG Chest Portable 1 View  Result Date: 02/11/2023 CLINICAL DATA:  tube placement EXAM: PORTABLE CHEST 1 VIEW COMPARISON:  05/01/2020. FINDINGS: Low lung volume. There are patchy atelectatic changes at the left lung base. No mass or consolidation. Bilateral lung fields are otherwise clear. Bilateral costophrenic angles are clear. Normal cardio-mediastinal silhouette. No acute osseous abnormalities. The soft tissues are within normal  limits. Tube/lines: An enteric tube extends below diaphragm, tip and sideport overlying the stomach. Endotracheal tube tip is 1.4 cm from the carina. This could be pulled back approximately 2 cm, if clinically desired. IMPRESSION: 1. Low lung volume with left basilar atelectasis. 2. Endotracheal tube tip 1.4 cm from the carina. This could be pulled back approximately 2 cm, if clinically desired. 3. Enteric tube in satisfactory position. Electronically Signed   By: Jules Schick M.D.   On: 02/11/2023 15:38   CT HEAD CODE STROKE WO CONTRAST  Result Date: 02/11/2023 CLINICAL DATA:  Code stroke.  Neuro deficit, acute, stroke suspected EXAM: CT HEAD WITHOUT CONTRAST TECHNIQUE: Contiguous axial images were obtained from the base of the skull through the vertex without intravenous contrast. RADIATION DOSE REDUCTION: This exam was performed according to the departmental dose-optimization program which includes automated exposure control, adjustment of the mA and/or kV according to patient size and/or use of iterative reconstruction technique. COMPARISON:  CT head 07/04/2021. FINDINGS: Brain: Abnormal edema in the right frontal lobe with thickened and dense adjacent cortex. (for example series 2, image 23). No convincing evidence of acute infarct on motion limited assessment. No midline shift or hydrocephalus. Vascular: No hyperdense vessel identified. Skull: No acute fracture. Sinuses/Orbits: Clear sinuses.  No acute orbital finding. Other: No mastoid effusions. IMPRESSION: Abnormal edema in the right frontal lobe with somewhat thickened and dense adjacent cortex. This could potentially be due to acute/subacute infarct with petechial hemorrhage or underlying lesion (such as a metastasis in this patient with reported history of bladder cancer). Recommend MRI with contrast to better evaluate. Code stroke imaging results were communicated on 02/11/2023 at 2:52 pm to provider Dr. Amada Jupiter via telephone, who verbally  acknowledged these results. Electronically Signed   By: Feliberto Harts M.D.   On: 02/11/2023 15:02    Procedures .Critical Care  Performed by: Alvira Monday, MD Authorized by: Alvira Monday, MD   Critical care provider statement:    Critical care time (minutes):  30   Critical care was time spent personally by me on the following activities:  Development of treatment plan with patient or surrogate, discussions with consultants, evaluation of patient's response to treatment, examination of patient, ordering and review of laboratory studies, ordering and review of radiographic studies, ordering and performing treatments and interventions,  pulse oximetry, re-evaluation of patient's condition and review of old charts Procedure Name: Intubation Date/Time: 02/12/2023 11:10 PM  Performed by: Alvira Monday, MDPre-anesthesia Checklist: Patient identified, Patient being monitored, Emergency Drugs available, Timeout performed and Suction available Oxygen Delivery Method: Non-rebreather mask Preoxygenation: Pre-oxygenation with 100% oxygen Induction Type: Rapid sequence Ventilation: Mask ventilation without difficulty Number of attempts: 1 Placement Confirmation: ETT inserted through vocal cords under direct vision, CO2 detector and Breath sounds checked- equal and bilateral        Medications Ordered in ED Medications  succinylcholine (ANECTINE) 200 MG/10ML syringe (  Not Given 02/11/23 1528)  propofol (DIPRIVAN) 1000 MG/100ML infusion (20 mcg/kg/min  88 kg Intravenous Infusion Verify 02/12/23 2000)  docusate (COLACE) 50 MG/5ML liquid 100 mg (has no administration in time range)  polyethylene glycol (MIRALAX / GLYCOLAX) packet 17 g (has no administration in time range)  heparin injection 5,000 Units (5,000 Units Subcutaneous Given 02/12/23 2110)  famotidine (PEPCID) IVPB 20 mg premix (20 mg Intravenous New Bag/Given 02/12/23 2108)  lactated ringers infusion (0 mLs Intravenous Stopped  02/12/23 1113)  Chlorhexidine Gluconate Cloth 2 % PADS 6 each (6 each Topical Given 02/12/23 0611)  Oral care mouth rinse (15 mLs Mouth Rinse Given 02/12/23 2159)  Oral care mouth rinse (has no administration in time range)  fentaNYL (SUBLIMAZE) injection 25 mcg (has no administration in time range)  fentaNYL (SUBLIMAZE) injection 25-100 mcg (50 mcg Intravenous Given 02/12/23 2156)  levETIRAcetam (KEPPRA) IVPB 1500 mg/ 100 mL premix (0 mg Intravenous Stopped 02/12/23 1427)  dexamethasone (DECADRON) injection 10 mg (10 mg Intravenous Given 02/12/23 1037)  etomidate (AMIDATE) injection ( Intravenous Canceled Entry 02/11/23 1500)  rocuronium (ZEMURON) injection ( Intravenous Canceled Entry 02/11/23 1500)  0.9 %  sodium chloride infusion (0 mLs Intravenous Stopped 02/11/23 1604)  levETIRAcetam (KEPPRA) IVPB 1000 mg/100 mL premix (0 mg Intravenous Stopped 02/11/23 1510)    Followed by  levETIRAcetam (KEPPRA) IVPB 1000 mg/100 mL premix (0 mg Intravenous Stopped 02/11/23 1510)  iohexol (OMNIPAQUE) 350 MG/ML injection 75 mL (75 mLs Intravenous Contrast Given 02/11/23 1513)  potassium chloride 10 mEq in 100 mL IVPB (10 mEq Intravenous New Bag/Given 02/11/23 2032)  midazolam (VERSED) injection 6 mg (6 mg Intravenous Given 02/11/23 2210)  gadobutrol (GADAVIST) 1 MMOL/ML injection 8.5 mL (8.5 mLs Intravenous Contrast Given 02/11/23 2311)  magnesium sulfate IVPB 2 g 50 mL (0 g Intravenous Stopped 02/12/23 0750)  levETIRAcetam (KEPPRA) IVPB 1000 mg/100 mL premix (0 mg Intravenous Stopped 02/12/23 1003)  acetaminophen (TYLENOL) tablet 650 mg (650 mg Per Tube Given 02/12/23 1410)    ED Course/ Medical Decision Making/ A&P                                   71yo male with history of depression, DM2, htn, hlpd, bladder cancer who presents as a CODE STROKE with new onset seizures.  Glucose WNL with EMS.  GCS around 8, taken emergently to CT as I attempted to call wife regarding goals of care in setting of cancer history,  thought that altered mental status post-ictal/post versed and may improve and also to evaluate for possible ICH in discussion of goals of care.  CT head completed and without ICH, mental status not improved.  Attempted to call wife and unable to reach her, taken back from CT to room and intubated for airway protection.    XR with ETT on lower side, pulled back one cm.  Started on propool.  Neurology ordred keppra .   Taken back to CT for CTA, with CTA results reviewed by Neurology.  CT CSpine no charge ordered.  DDx continues to include seizure with intracranial metastases, CVA, ICH, electrolyte abnormalities, cardiac arrhythmias.  Labs completed and personally evaluated and interpreted by me show leukocytosis, no anemia, no clinically significant abnormalities on electrolytes.   Will admit to ICU for continued care.          Final Clinical Impression(s) / ED Diagnoses Final diagnoses:  Seizure (HCC)  Altered mental status, unspecified altered mental status type    Rx / DC Orders ED Discharge Orders     None         Alvira Monday, MD 02/12/23 2312

## 2023-02-12 NOTE — Progress Notes (Signed)
Palliative:  Chart reviewed. Evaluated patient at bedside, discussed care with RN.  Wife not at bedside during my eval - we spoke via phone. She prefers meeting tomorrow AM. We scheduled meeting 8/1 at 0900.  Gerlean Ren, DNP, AGNP-C Palliative Medicine Team Team Phone # [phone]  Pager # [phone]  NO CHARGE

## 2023-02-12 NOTE — Procedures (Signed)
[facility]  Long-term EEG Report  Video Performed: Performed   Demographics: Patient Name:   Charles Trujillo, Charles Trujillo  Date of Birth:   [birth year]  Identification Number:   MRN - [id number]  Study Times:  Study Start Time:   02/11/2023 18:00:00 Study End Time:   02/12/2023 02:00:00  Indication(s): Seizures  Medication:  Keppra, propofol  Technical Summary:  This EEG was performed utilizing standard International 10-20 System of electrode placement. One channel electrocardiogram was monitored. Data were obtained and interpreted utilizing referential montage recording, with reformatting to longitudinal, transverse bipolar, and referential montages as necessary for interpretation.  State(s):      Medically Induced Coma   Activation Procedures: Hyperventilation: Not performed Photic Stimulation: Not performed   EEG Description:  EEG background showed an absent of wakeful posterior dominant alpha rhythm. The background was continuous, low amplitude delta slowing with anterior alpha and theta noted.  Frequent right frontal spikes/sharply contour waves were seen waxing and waning up to 2.0 Hz throughout the record concerning for an interictal-ictal continuum. There was no clear or persistent EEG evolution to electrographic seizure.  No clinical seizure was seen in the video.  There was no clinical event or button-pushed event noted.  Physiologic sleep stage II sleep spindle and vertex waves were not recorded.  Intermittent electrode and movement artifacts were noted.  Impression:  This is an abnormal EEG showing:  1. Potential epileptogenic focus in the right frontal region and at high risk of seizure or focal status epilepticus.  2. Focal slowing suggestive of underlying structural abnormality and/or cortical dysfunction in the right frontal region.  3. Severe diffuse encephalopathy  There is no electrographic seizures or  evidence of status epilepticus present but at high risk.  The study is ongoing.    Dr Judithann Sheen Jeffrie Stander    This long-term video EEG is performed and recorded for review by an interpreting physician periodically and is not live continuous monitoring. Reviewing physicians have the ability to access live data at any time during the study if requested. Studies must be automatically or manually uploaded by the performing hospital every 12 hours for review. If the patient experiences clinical changes that may be related to seizure activity, immediately contact the [facility]) 24 hours a day, 7 days a week for a stat EEG review/interpretation. [facility] Contact Number: [phone].  Results conveyed to Dr. Levon Hedger Elita Quick, RN at 02/12/2023 08:55:31  [facility] For Inpatient follow-up with [facility] physician please call [facility] [phone]. This is not an outpatient service. Post hospital discharge, please contact hospital directly.   Please call or reconsult our service if there are any clinical or diagnostic changes.

## 2023-02-12 NOTE — Progress Notes (Signed)
   02/12/23 0742  Daily Weaning Assessment  Daily Assessment of Readiness to Wean Wean protocol criteria met (SBT performed)  Reason not met Apnea  SBT Method CPAP 5 cm H20 and PS 5 cm H20  Weaning Start Time [phone]  Patient response Failed SBT terminated   RT placed Pt on wean but did not tolerate due to apnea. Pt placed back on full support for now.

## 2023-02-12 NOTE — Procedures (Signed)
[facility]  Long-term EEG Report  Video Performed: Performed   Demographics: Patient Name:   Charles Trujillo, Charles Trujillo  Date of Birth:   10/05/1950  Identification Number:   MRN - [id number]  Study Times:  Study Start Time:   02/12/2023 02:00:00 Study End Time:   02/12/2023 14:00:00  Indication(s): Seizures, Encephalopathy  Medication:  Keppra, fentanyl  Technical Summary:  This EEG was performed utilizing standard International 10-20 System of electrode placement. One channel electrocardiogram was monitored. Data were obtained and interpreted utilizing referential montage recording, with reformatting to longitudinal, transverse bipolar, and referential montages as necessary for interpretation.  State(s):      Medically Induced Coma   Activation Procedures: Hyperventilation: Not performed Photic Stimulation: Not performed   EEG Description:  EEG background showed an abasent of posterior dominant alpha rhythm. There was background slowing in delta and theta range.  There was a continuous focal slowing with polymorphic delta in the right frontal region.  Frequent spikes and polyspikes were seen waxing and waning with frequency up to 2.0Hz  over the right frontal region. There was no clear EEG evolution to electrographic seizure.  There was no clinical event or button-pushed event noted.  Physiologic sleep stage II sleep spindle and vertex waves were not recorded.    Impression:  This is an abnormal EEG showing:  1. Potential epileptogenic focus in the right frontal region in the category of ictal-interictal continuum with high risk of progressing into electrographic seizure  2. Focal slowing suggestive of underlying structural abnormality and/or cortical dysfunction in the right frontal region  3. Severe diffuse encephalopathy  The study is ongoing. This is unchanged from prior 12-hr record.   Dr Judithann Sheen  Yuriana Gaal    This long-term video EEG is performed and recorded for review by an interpreting physician periodically and is not live continuous monitoring. Reviewing physicians have the ability to access live data at any time during the study if requested. Studies must be automatically or manually uploaded by the performing hospital every 12 hours for review. If the patient experiences clinical changes that may be related to seizure activity, immediately contact the [facility]) 24 hours a day, 7 days a week for a stat EEG review/interpretation. [facility] Contact Number: [phone].  [facility] For Inpatient follow-up with [facility] physician please call [facility] [phone]. This is not an outpatient service. Post hospital discharge, please contact hospital directly.   Please call or reconsult our service if there are any clinical or diagnostic changes.

## 2023-02-12 NOTE — Progress Notes (Signed)
NAME:  Charles Trujillo, MRN:  [id number], DOB:  1950/08/29, LOS: 1 ADMISSION DATE:  02/11/2023, CONSULTATION DATE:  02/12/23  REFERRING MD:  ED, CHIEF COMPLAINT:  AMS   History of Present Illness:  Charles Trujillo is a 72 year old male with past medical history notable for hypertension, hyperlipidemia, DM, GERD, anxiety and depression, bladder cancer.  Patient presented to [facility] emergency department today 02/11/2023 via EMS after experiencing a grand mal seizure at home per EMS.  Last known well was at 1:30 PM today.  Wife is with him and left the room and when she came back she found him having a seizure on the floor.  She had called EMS and they activated a code stroke at that time.  Initially on EMS evaluation patient was found hypertensive 200s/100s and hypoglycemic to 51.  Per EMS en route to hospital he had a focal seizure involving his mouth and then had a grand mal seizure he was given 2.5 mg Versed.  In the emergency department CT head showed to be abnormal edema in the right frontal lobe with thickened and dense adjacent cortex.  Could potentially be due to acute/subacute infarct with petechial hemorrhage or underlying lesion.  Patient required intubation after CT for airway protection.  Also was given 2 g IV Keppra and stat EEG ordered.  CTA head and neck with no LVO.  PCCM was consulted at that time for admission  Pertinent  Medical History  hypertension, hyperlipidemia, DM, GERD, anxiety and depression, bladder cancer  Significant Hospital Events: Including procedures, antibiotic start and stop dates in addition to other pertinent events   7/30- admitted to ICU, grand mal seizure and CT head with ? subacute/acute infarct. Intubated for airway protection. 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 rate 18, height 5\' 7"  (1.702 m), weight 86 kg, SpO2 99%.    Vent Mode: PRVC FiO2 (%):  [40 %-100 %] 40 % Set Rate:  [18 bmp] 18 bmp Vt Set:  [520 mL] 520 mL PEEP:  [5 cmH20] 5 cmH20 Plateau Pressure:  [14 cmH20-15 cmH20] 15 cmH20   Intake/Output Summary (Last 24 hours) at 02/12/2023 0835 Last data filed at 02/12/2023 0600 Gross per 24 hour  Intake 2539.52 ml  Output 1410 ml  Net 1129.52 ml   Filed Weights   02/11/23 1552  Weight: 86 kg    Examination: Appearance -chronically ill-appearing ENMT -moist oral mucosa, endotracheal tube in place Neck -neck collar in place Respiratory -fair air entry, clear breath sounds  CV -S1-S2 appreciated GI -bowel sounds appreciated Skin -skin is warm and dry Neuro -unresponsive    Resolved Hospital Problem list     Assessment & Plan:   Status Epilepticus -Without prior history of seizures -On continuous EEG monitoring -Discussed with epileptologist, neurologist -Dose of Keppra will be increased -He continues to have frequent discharges, this area of discharges is related to with metastatic disease is -Appreciate neurology help with management  Acute hypoxemic respiratory failure -Continue vent management -Not a candidate for weaning at present -Continue bronchial hygiene -Agitation management with fentanyl and propofol -Elevate head of bed -Plateau pressure less than 30  History of bladder cancer with metastatic disease -Recent CT had shown retroperitoneal adenopathy -Now MRI of the head also showing metastatic disease  Acute kidney injury -Avoid nephrotoxic medications -Maintain renal perfusion  Discussed with spouse at bedside Updated daughter about MRI findings Discussed with neurology   Best Practice (right click and "Reselect all SmartList Selections" daily)   Diet/type: NPO DVT prophylaxis: prophylactic heparin  GI prophylaxis: H2B Lines: N/A Foley:  N/A Code Status:  DNR Last date of  multidisciplinary goals of care discussion [02/12/23 ]  Labs   CBC: Recent Labs  Lab 02/11/23 1430 02/11/23 1439 02/11/23 1548 02/12/23 0542  WBC 17.8*  --   --  8.7  NEUTROABS 10.9*  --   --   --   HGB 14.3 16.0 13.9 13.2  HCT 48.1 47.0 41.0 39.3  MCV 96.6  --   --  87.5  PLT 321  --   --  176    Basic Metabolic Panel: Recent Labs  Lab 02/11/23 1430 02/11/23 1439 02/11/23 1548 02/12/23 0542  NA 136 135 135 134*  K 3.4* 3.4* 3.5 4.9  CL 98 100  --  103  CO2 14*  --   --  20*  GLUCOSE 171* 162*  --  108*  BUN 16 16  --  12  CREATININE 1.46* 1.20  --  0.92  CALCIUM 9.0  --   --  8.5*  MG  --   --   --  1.7  PHOS  --   --   --  3.2   GFR: Estimated Creatinine Clearance: 75 mL/min (by C-G formula based on SCr of 0.92 mg/dL). Recent Labs  Lab 02/11/23 1430 02/12/23 0542  WBC 17.8* 8.7    Liver Function Tests: Recent Labs  Lab 02/11/23 1430  AST 56*  ALT 42  ALKPHOS 61  BILITOT 0.7  PROT 7.3  ALBUMIN 3.9   No results for input(s): "LIPASE", "AMYLASE" in the last 168 hours. No results for input(s): "AMMONIA" in the last 168 hours.  ABG    Component Value Date/Time   PHART 7.397 02/11/2023 1548   PCO2ART 34.2 02/11/2023 1548   PO2ART 396 (H) 02/11/2023 1548   HCO3 21.1 02/11/2023 1548   TCO2 22 02/11/2023 1548   ACIDBASEDEF 3.0 (H) 02/11/2023 1548   O2SAT 100 02/11/2023 1548     Coagulation Profile: Recent Labs  Lab 02/11/23 1430  INR 1.3*    Cardiac Enzymes: No results for input(s): "CKTOTAL", "CKMB", "CKMBINDEX", "TROPONINI" in the last 168 hours.  HbA1C: Hgb A1c MFr Bld  Date/Time Value Ref Range Status  08/27/2022 12:01 PM 6.6 (H) 4.6 - 6.5 % Final    Comment:    Glycemic Control Guidelines for People with Diabetes:Non Diabetic:  <6%Goal of Therapy: <7%Additional Action Suggested:  >8%   01/18/2022 03:05 PM 7.3 (H) <5.7 % of total Hgb Final    Comment:    For someone without known diabetes, a hemoglobin A1c value of 6.5% or  greater indicates that they may have  diabetes and this should be confirmed with a follow-up  test. . For someone with known diabetes, a value <7% indicates  that their diabetes is well controlled and a value  greater than or equal to 7% indicates suboptimal  control. A1c targets should be individualized based on  duration of diabetes, age, comorbid conditions, and  other considerations. . Currently, no consensus exists regarding use of hemoglobin A1c for diagnosis of diabetes for children. .     CBG: Recent Labs  Lab 02/11/23 1431  GLUCAP 160*    Review of Systems:   Negative except as listed in HPI and POC.  Past Medical History:  He,  has a past medical history of Anxiety, Arthritis, Depression, DM2 (diabetes mellitus, type 2) (HCC), Dysrhythmia, Elevated LFTs, GERD (gastroesophageal reflux disease), HLD (hyperlipidemia), Hypertension, Inguinal hernia, and Squamous cell carcinoma.   Surgical History:   Past Surgical History:  Procedure Laterality Date   ACHILLES TENDON REPAIR  1999   BLADDER SURGERY     CARPAL TUNNEL RELEASE Right 11/04/2014   Procedure: LIMITED OPEN RIGHT CARPAL TUNNEL RELEASE;  Surgeon: Dominica Severin, MD;  Location: [facility];  Service: Orthopedics;  Laterality: Right;   CARPAL TUNNEL WITH CUBITAL TUNNEL Left 07/22/2014   Procedure: LEFT CARPAL TUNNEL RELEASE AND CUBITAL TUNNEL RELEASE TRANSPOSITION;  Surgeon: Dominica Severin, MD;  Location: [facility];  Service: Orthopedics;  Laterality: Left;   COLONOSCOPY     EYE SURGERY     laser eye surgery bilat    HERNIA REPAIR  07/2011   left side   INGUINAL HERNIA REPAIR  08/09/2011   Procedure: LAPAROSCOPIC INGUINAL HERNIA;  Surgeon: Clovis Pu. Cornett, MD;  Location: [facility];  Service: General;  Laterality: Right;   KNEE ARTHROSCOPY  1993   left knee   KNEE SURGERY     age 32- right   SQUAMOUS CELL CARCINOMA EXCISION     left knee   TOTAL KNEE ARTHROPLASTY Left 05/04/2019    Procedure: LEFT TOTAL KNEE ARTHROPLASTY;  Surgeon: Valeria Batman, MD;  Location: [facility];  Service: Orthopedics;  Laterality: Left;   TOTAL KNEE ARTHROPLASTY Right 05/09/2020   Procedure: RIGHT TOTAL KNEE ARTHROPLASTY;  Surgeon: Valeria Batman, MD;  Location: [facility];  Service: Orthopedics;  Laterality: Right;     Social History:   reports that he has been smoking cigars. He has never used smokeless tobacco. He reports current alcohol use of about 3.0 - 4.0 standard drinks of alcohol per week. He reports that he does not use drugs.   Family History:  His family history includes Alcohol abuse in his brother; Cancer in his maternal grandmother; Depression in his brother; Diabetes in his mother; Heart disease in his mother; Heart failure in his mother; Hypertension in his mother; Parkinsonism in his father; Stroke (age of onset: 54) in his father. There is no history of Colon cancer, Colon polyps, Esophageal cancer, Rectal cancer, or Stomach cancer.   Allergies Allergies  Allergen Reactions   Meth-Hyo-M Bl-Na Phos-Ph Sal Other (See Comments)    [facility] believes it may have contributed to renal failure accourding to his spouse who informed us at 17:17 01/19/2021   Codeine Nausea Only    The patient is critically ill with multiple organ systems failure and requires high complexity decision making for assessment and support, frequent evaluation and titration of therapies, application of advanced monitoring technologies and extensive interpretation of multiple databases. Critical Care Time devoted to patient care services described in this note independent of APP/resident time (if applicable)  is 33 minutes.  33 Virl Diamond MD [facility] Personal pager: See Amion If unanswered, please page CCM On-call: #[phone]

## 2023-02-12 NOTE — Progress Notes (Signed)
Subjective: Continues to have frequent discharges with overriding fast activity on the ictal-interictal continuum. Very focal in the area of his met.   He was unfortunately found to have leptomeningeal spread as well.   Exam: Vitals:   02/12/23 0800 02/12/23 0900  BP: 116/79 119/82  Pulse: 72 78  Resp: 18 18  Temp: 99.7 F (37.6 C) 99.7 F (37.6 C)  SpO2: 100% 99%   Gen: In bed, intubated  Neuro: MS: does not open eyes or follow commands.  ZO:XWRUE, eyes midline Motor: withdraws to noxious stimulation R > L  Sensory:as above.  Pertinent Labs: Cr 0.92 Mg is borderline.   Impression: 72 yo M with newly diagnosed cerebral metastasis with leptomeningeal spread. He has a highly concerning ictal-interictal continuum EEG pattern which I would favor leaning towards ictal and would favor more aggressive treatment. Given how focal it is, I think it would be helpful to pause sedation and assess mental status prior to aggressively chasing it.   There is some edema which might be contributing to irritability.   Recommendations: 1) Increase keppra to 1500mg  BID and give 1g x 1 2) If no improvement, would likely use fosphenytoin 20mg /kg as next line.  3) If mental status is poor, will also perform ativan trial.  4) Especially given underlying poor prognosis and focal nature of his discharges, would not favor progressing to burst suppression, etc, even it it reveals itself to be an ictal pattern.  5) neurology to follow.   This patient is critically ill and at significant risk of neurological worsening, death and care requires constant monitoring of vital signs, hemodynamics,respiratory and cardiac monitoring, neurological assessment, discussion with family, other specialists and medical decision making of high complexity. I spent 45 minutes of neurocritical care time  in the care of  this patient. This was time spent independent of any time provided by nurse practitioner or PA.  Ritta Slot, MD [facility] [phone]  If 7pm- 7am, please page neurology on call as listed in AMION. 02/12/2023  10:02 AM

## 2023-02-12 NOTE — Progress Notes (Signed)
LTM maint complete - no skin breakdown under: Fp1 Fp2  Serviced A1 Fp1 [facility] monitored, Event button test confirmed by [facility].

## 2023-02-12 NOTE — Progress Notes (Signed)
[facility] ADULT ICU REPLACEMENT PROTOCOL   The patient does apply for the [facility] Adult ICU Electrolyte Replacment Protocol based on the criteria listed below:   1.Exclusion criteria: TCTS, ECMO, Dialysis, and Myasthenia Gravis patients 2. Is GFR >/= 30 ml/min? Yes.    Patient's GFR today is >60 3. Is SCr </= 2? Yes.   Patient's SCr is 0.92 mg/dL 4. Did SCr increase >/= 0.5 in 24 hours? No. 5.Pt's weight >40kg  Yes.   6. Abnormal electrolyte(s): Mag 1.7  7. Electrolytes replaced per protocol 8.  Call MD STAT for K+ </= 2.5, Phos </= 1, or Mag </= 1 Physician:  [facility] 02/12/2023 6:24 AM

## 2023-02-13 DIAGNOSIS — R4182 Altered mental status, unspecified: Secondary | ICD-10-CM | POA: Diagnosis not present

## 2023-02-13 DIAGNOSIS — R569 Unspecified convulsions: Secondary | ICD-10-CM | POA: Diagnosis not present

## 2023-02-13 DIAGNOSIS — Z515 Encounter for palliative care: Secondary | ICD-10-CM

## 2023-02-13 DIAGNOSIS — Z66 Do not resuscitate: Secondary | ICD-10-CM

## 2023-02-13 DIAGNOSIS — C679 Malignant neoplasm of bladder, unspecified: Secondary | ICD-10-CM

## 2023-02-13 DIAGNOSIS — Z7189 Other specified counseling: Secondary | ICD-10-CM | POA: Diagnosis not present

## 2023-02-13 DIAGNOSIS — G40901 Epilepsy, unspecified, not intractable, with status epilepticus: Secondary | ICD-10-CM | POA: Diagnosis not present

## 2023-02-13 LAB — GLUCOSE, CAPILLARY
Glucose-Capillary: 170 mg/dL — ABNORMAL HIGH (ref 70–99)
Glucose-Capillary: 190 mg/dL — ABNORMAL HIGH (ref 70–99)
Glucose-Capillary: 195 mg/dL — ABNORMAL HIGH (ref 70–99)
Glucose-Capillary: 215 mg/dL — ABNORMAL HIGH (ref 70–99)

## 2023-02-13 LAB — MAGNESIUM: Magnesium: 1.8 mg/dL (ref 1.7–2.4)

## 2023-02-13 LAB — PHOSPHORUS: Phosphorus: 3.7 mg/dL (ref 2.5–4.6)

## 2023-02-13 LAB — PHENYTOIN LEVEL, TOTAL: Phenytoin Lvl: 19.5 ug/mL (ref 10.0–20.0)

## 2023-02-13 MED ORDER — SODIUM CHLORIDE 0.9 % IV SOLN
1500.0000 mg | Freq: Once | INTRAVENOUS | Status: AC
  Administered 2023-02-13: 1500 mg via INTRAVENOUS
  Filled 2023-02-13: qty 20

## 2023-02-13 MED ORDER — LORAZEPAM 2 MG/ML IJ SOLN
4.0000 mg | INTRAMUSCULAR | Status: AC
  Administered 2023-02-13: 4 mg via INTRAVENOUS
  Filled 2023-02-13: qty 2

## 2023-02-13 MED ORDER — MAGNESIUM SULFATE 2 GM/50ML IV SOLN
2.0000 g | Freq: Once | INTRAVENOUS | Status: AC
  Administered 2023-02-13: 2 g via INTRAVENOUS
  Filled 2023-02-13: qty 50

## 2023-02-13 MED ORDER — SODIUM CHLORIDE 0.9 % IV BOLUS
500.0000 mL | Freq: Once | INTRAVENOUS | Status: AC
  Administered 2023-02-13: 500 mL via INTRAVENOUS

## 2023-02-13 MED ORDER — LACOSAMIDE 50 MG PO TABS
200.0000 mg | ORAL_TABLET | Freq: Two times a day (BID) | ORAL | Status: DC
  Administered 2023-02-13: 200 mg
  Filled 2023-02-13: qty 4

## 2023-02-13 MED ORDER — LORAZEPAM 2 MG/ML IJ SOLN
4.0000 mg | Freq: Once | INTRAMUSCULAR | Status: AC
  Administered 2023-02-13: 4 mg via INTRAVENOUS
  Filled 2023-02-13: qty 2

## 2023-02-13 MED ORDER — ARTIFICIAL TEARS OPHTHALMIC OINT
TOPICAL_OINTMENT | Freq: Two times a day (BID) | OPHTHALMIC | Status: DC | PRN
  Filled 2023-02-13: qty 3.5

## 2023-02-13 MED ORDER — PIVOT 1.5 CAL PO LIQD
1000.0000 mL | ORAL | Status: DC
  Administered 2023-02-13 – 2023-02-14 (×2): 1000 mL

## 2023-02-13 MED ORDER — SODIUM CHLORIDE 0.9 % IV SOLN
400.0000 mg | Freq: Once | INTRAVENOUS | Status: AC
  Administered 2023-02-13: 400 mg via INTRAVENOUS
  Filled 2023-02-13: qty 40

## 2023-02-13 MED ORDER — SODIUM CHLORIDE 0.9 % IV SOLN: INTRAVENOUS | Status: AC

## 2023-02-13 MED ORDER — INSULIN ASPART 100 UNIT/ML IJ SOLN
0.0000 [IU] | INTRAMUSCULAR | Status: DC
  Administered 2023-02-13: 3 [IU] via SUBCUTANEOUS
  Administered 2023-02-14: 5 [IU] via SUBCUTANEOUS
  Administered 2023-02-14 (×2): 3 [IU] via SUBCUTANEOUS

## 2023-02-13 MED ORDER — BETHANECHOL CHLORIDE 10 MG PO TABS
10.0000 mg | ORAL_TABLET | Freq: Three times a day (TID) | ORAL | Status: AC
  Administered 2023-02-13 – 2023-02-15 (×9): 10 mg
  Filled 2023-02-13 (×8): qty 1

## 2023-02-13 MED ORDER — PHENYTOIN SODIUM 50 MG/ML IJ SOLN
100.0000 mg | Freq: Three times a day (TID) | INTRAMUSCULAR | Status: DC
  Administered 2023-02-13 – 2023-02-16 (×10): 100 mg via INTRAVENOUS
  Filled 2023-02-13 (×10): qty 2

## 2023-02-13 MED ORDER — PROSOURCE TF20 ENFIT COMPATIBL EN LIQD
60.0000 mL | Freq: Every day | ENTERAL | Status: DC
  Administered 2023-02-13 – 2023-02-15 (×3): 60 mL
  Filled 2023-02-13 (×3): qty 60

## 2023-02-13 NOTE — TOC CM/SW Note (Signed)
Transition of Care [facility]) - Inpatient Brief Assessment   Patient Details  Name: Charles Trujillo MRN: [id number] Date of Birth: 04-08-1951  Transition of Care [facility]) CM/SW Contact:    Mearl Latin, LCSW Phone Number: 02/13/2023, 9:34 AM   Clinical Narrative: Patient admitted from home with his wife and newly diagnosed cerebral metastasis. No current TOC needs identified at this time but patient is currently intubated so please place consult as needs arise.    Transition of Care Asessment: Insurance and Status: Insurance coverage has been reviewed Patient has primary care physician: Yes Home environment has been reviewed: From home Prior level of function:: Independent Prior/Current Home Services: No current home services Social Determinants of Health Reivew: SDOH reviewed no interventions necessary Readmission risk has been reviewed: Yes Transition of care needs: no transition of care needs at this time

## 2023-02-13 NOTE — Progress Notes (Signed)
Performed maintenance on Fp1.  All under 10.  No visible skin breakdown.

## 2023-02-13 NOTE — Progress Notes (Signed)
eLink Physician-Brief Progress Note Patient Name: Charles Trujillo DOB: Mar 28, 1951 MRN: [id number]   Date of Service  02/13/2023  HPI/Events of Note  Patient with oliguria since maintenance IV fluids were discontinued.  eICU Interventions  NS at 75 ml / hour ordered.        Thomasene Lot Jhostin Epps 02/13/2023, 4:44 AM

## 2023-02-13 NOTE — Progress Notes (Signed)
eLink Physician-Brief Progress Note Patient Name: Charles Trujillo DOB: February 27, 1951 MRN: [id number]   Date of Service  02/13/2023  HPI/Events of Note  Mg = 1.8 @ 1648. Not replaced  eICU Interventions  Magnesium sulfate 1 g IVPB ordered     Intervention Category Intermediate Interventions: Electrolyte abnormality - evaluation and management  Darl Pikes 02/13/2023, 10:36 PM

## 2023-02-13 NOTE — Progress Notes (Signed)
NAME:  Charles Trujillo, MRN:  [id number], DOB:  1951-01-10, LOS: 2 ADMISSION DATE:  02/11/2023, CONSULTATION DATE:  02/13/23  REFERRING MD:  ED, CHIEF COMPLAINT:  AMS   History of Present Illness:  Charles Trujillo is a 72 year old male with past medical history notable for hypertension, hyperlipidemia, DM, GERD, anxiety and depression, bladder cancer.  Patient presented to [facility] emergency department today 02/11/2023 via EMS after experiencing a grand mal seizure at home per EMS.  Last known well was at 1:30 PM today.  Wife is with him and left the room and when she came back she found him having a seizure on the floor.  She had called EMS and they activated a code stroke at that time.  Initially on EMS evaluation patient was found hypertensive 200s/100s and hypoglycemic to 51.  Per EMS en route to hospital he had a focal seizure involving his mouth and then had a grand mal seizure he was given 2.5 mg Versed.  In the emergency department CT head showed to be abnormal edema in the right frontal lobe with thickened and dense adjacent cortex.  Could potentially be due to acute/subacute infarct with petechial hemorrhage or underlying lesion.  Patient required intubation after CT for airway protection.  Also was given 2 g IV Keppra and stat EEG ordered.  CTA head and neck with no LVO.  PCCM was consulted at that time for admission  Pertinent  Medical History  hypertension, hyperlipidemia, DM, GERD, anxiety and depression, bladder cancer  Significant Hospital Events: Including procedures, antibiotic start and stop dates in addition to other pertinent events   7/30- admitted to ICU, grand mal seizure and CT head with ? subacute/acute infarct. Intubated for airway protection. MRI pending 7/31-MRI showing leptomeningeal spread of carcinomatosis 8/1-continuous EEG monitoring showing seizures  Interim History / Subjective:  Remains intubated No overnight events  Evidence of  seizures  Objective   Blood pressure 129/71, pulse 69, temperature 97.9 F (36.6 C), temperature source Bladder, resp. rate 18, height 5\' 7"  (1.702 m), weight 88.9 kg, SpO2 98%.    Vent Mode: PRVC FiO2 (%):  [40 %] 40 % Set Rate:  [18 bmp] 18 bmp Vt Set:  [520 mL] 520 mL PEEP:  [5 cmH20] 5 cmH20 Plateau Pressure:  [13 cmH20-16 cmH20] 16 cmH20   Intake/Output Summary (Last 24 hours) at 02/13/2023 1116 Last data filed at 02/13/2023 0800 Gross per 24 hour  Intake 726.08 ml  Output 1080 ml  Net -353.92 ml   Filed Weights   02/11/23 1552 02/13/23 0500  Weight: 86 kg 88.9 kg    Examination: Appearance -critically ill-appearing ENMT -endotracheal tube in place Neck -neck collar in place Respiratory -clear breath sounds CV -S1-S2 appreciated GI -bowel sounds appreciated Skin -skin is warm and dry Neuro -unresponsive    Resolved Hospital Problem list     Assessment & Plan:   Status epilepticus -Remains on continuous EEG monitoring -Discussed with epileptologist, neurologist -On higher dose of Keppra -Benzodiazepines been added -Fosphenytoin added -Appreciate neurology's assistance with management -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  Acute hypoxemic respiratory failure -Continue mechanical ventilation -Target TVol 6-8cc/kgIBW -Target Plateau Pressure < 30cm H20 -Target driving pressure less than 15 cm of water -Target PaO2 55-65: titrate PEEP/FiO2 per protocol -As long as PaO2 to FiO2 ratio is less than 1:150 position in prone position for 16 hours a day -Ventilator associated pneumonia prevention protocol  History of bladder cancer with metastatic disease -Recent CT with  retroperitoneal adenopathy -Now MRI with leptomeningeal metastases  Acute kidney injury -Continue to maintain renal perfusion  He does have a c-collar in place -No evidence of fracture in his cervical spine -If able to clarify diet does not have any pain or  discomfort around his neck with movement, the collar can be removed  Discussed with spouse at bedside Discussed with neurology  Best Practice (right click and "Reselect all SmartList Selections" daily)   Diet/type: tubefeeds DVT prophylaxis: prophylactic heparin  GI prophylaxis: H2B Lines: N/A Foley:  Yes, and it is still needed Code Status:  DNR Last date of multidisciplinary goals of care discussion [02/13/23 ]  Labs   CBC: Recent Labs  Lab 02/11/23 1430 02/11/23 1439 02/11/23 1548 02/12/23 0542  WBC 17.8*  --   --  8.7  NEUTROABS 10.9*  --   --   --   HGB 14.3 16.0 13.9 13.2  HCT 48.1 47.0 41.0 39.3  MCV 96.6  --   --  87.5  PLT 321  --   --  176    Basic Metabolic Panel: Recent Labs  Lab 02/11/23 1430 02/11/23 1439 02/11/23 1548 02/12/23 0542  NA 136 135 135 134*  K 3.4* 3.4* 3.5 4.9  CL 98 100  --  103  CO2 14*  --   --  20*  GLUCOSE 171* 162*  --  108*  BUN 16 16  --  12  CREATININE 1.46* 1.20  --  0.92  CALCIUM 9.0  --   --  8.5*  MG  --   --   --  1.7  PHOS  --   --   --  3.2   GFR: Estimated Creatinine Clearance: 76.1 mL/min (by C-G formula based on SCr of 0.92 mg/dL). Recent Labs  Lab 02/11/23 1430 02/12/23 0542  WBC 17.8* 8.7    Liver Function Tests: Recent Labs  Lab 02/11/23 1430  AST 56*  ALT 42  ALKPHOS 61  BILITOT 0.7  PROT 7.3  ALBUMIN 3.9   No results for input(s): "LIPASE", "AMYLASE" in the last 168 hours. No results for input(s): "AMMONIA" in the last 168 hours.  ABG    Component Value Date/Time   PHART 7.397 02/11/2023 1548   PCO2ART 34.2 02/11/2023 1548   PO2ART 396 (H) 02/11/2023 1548   HCO3 21.1 02/11/2023 1548   TCO2 22 02/11/2023 1548   ACIDBASEDEF 3.0 (H) 02/11/2023 1548   O2SAT 100 02/11/2023 1548     Coagulation Profile: Recent Labs  Lab 02/11/23 1430  INR 1.3*    Cardiac Enzymes: No results for input(s): "CKTOTAL", "CKMB", "CKMBINDEX", "TROPONINI" in the last 168 hours.  HbA1C: Hgb A1c MFr Bld   Date/Time Value Ref Range Status  08/27/2022 12:01 PM 6.6 (H) 4.6 - 6.5 % Final    Comment:    Glycemic Control Guidelines for People with Diabetes:Non Diabetic:  <6%Goal of Therapy: <7%Additional Action Suggested:  >8%   01/18/2022 03:05 PM 7.3 (H) <5.7 % of total Hgb Final    Comment:    For someone without known diabetes, a hemoglobin A1c value of 6.5% or greater indicates that they may have  diabetes and this should be confirmed with a follow-up  test. . For someone with known diabetes, a value <7% indicates  that their diabetes is well controlled and a value  greater than or equal to 7% indicates suboptimal  control. A1c targets should be individualized based on  duration of diabetes, age, comorbid conditions, and  other considerations. . Currently, no consensus exists regarding use of hemoglobin A1c for diagnosis of diabetes for children. .     CBG: Recent Labs  Lab 02/11/23 1431  GLUCAP 160*    Review of Systems:   Negative except as listed in HPI and POC.  Past Medical History:  He,  has a past medical history of Anxiety, Arthritis, Depression, DM2 (diabetes mellitus, type 2) (HCC), Dysrhythmia, Elevated LFTs, GERD (gastroesophageal reflux disease), HLD (hyperlipidemia), Hypertension, Inguinal hernia, and Squamous cell carcinoma.   Surgical History:   Past Surgical History:  Procedure Laterality Date   ACHILLES TENDON REPAIR  1999   BLADDER SURGERY     CARPAL TUNNEL RELEASE Right 11/04/2014   Procedure: LIMITED OPEN RIGHT CARPAL TUNNEL RELEASE;  Surgeon: Dominica Severin, MD;  Location: [facility];  Service: Orthopedics;  Laterality: Right;   CARPAL TUNNEL WITH CUBITAL TUNNEL Left 07/22/2014   Procedure: LEFT CARPAL TUNNEL RELEASE AND CUBITAL TUNNEL RELEASE TRANSPOSITION;  Surgeon: Dominica Severin, MD;  Location: [facility];  Service: Orthopedics;  Laterality: Left;   COLONOSCOPY     EYE SURGERY     laser eye surgery bilat    HERNIA  REPAIR  07/2011   left side   INGUINAL HERNIA REPAIR  08/09/2011   Procedure: LAPAROSCOPIC INGUINAL HERNIA;  Surgeon: Clovis Pu. Cornett, MD;  Location: [facility];  Service: General;  Laterality: Right;   KNEE ARTHROSCOPY  1993   left knee   KNEE SURGERY     age 65- right   SQUAMOUS CELL CARCINOMA EXCISION     left knee   TOTAL KNEE ARTHROPLASTY Left 05/04/2019   Procedure: LEFT TOTAL KNEE ARTHROPLASTY;  Surgeon: Valeria Batman, MD;  Location: [facility];  Service: Orthopedics;  Laterality: Left;   TOTAL KNEE ARTHROPLASTY Right 05/09/2020   Procedure: RIGHT TOTAL KNEE ARTHROPLASTY;  Surgeon: Valeria Batman, MD;  Location: [facility];  Service: Orthopedics;  Laterality: Right;     Social History:   reports that he has been smoking cigars. He has never used smokeless tobacco. He reports current alcohol use of about 3.0 - 4.0 standard drinks of alcohol per week. He reports that he does not use drugs.   Family History:  His family history includes Alcohol abuse in his brother; Cancer in his maternal grandmother; Depression in his brother; Diabetes in his mother; Heart disease in his mother; Heart failure in his mother; Hypertension in his mother; Parkinsonism in his father; Stroke (age of onset: 34) in his father. There is no history of Colon cancer, Colon polyps, Esophageal cancer, Rectal cancer, or Stomach cancer.   Allergies Allergies  Allergen Reactions   Meth-Hyo-M Bl-Na Phos-Ph Sal Other (See Comments)    [facility] believes it may have contributed to renal failure accourding to his spouse who informed us at 17:17 01/19/2021   Codeine Nausea Only    The patient is critically ill with multiple organ systems failure and requires high complexity decision making for assessment and support, frequent evaluation and titration of therapies, application of advanced monitoring technologies and extensive interpretation of multiple databases. Critical Care Time devoted to patient care services described in  this note independent of APP/resident time (if applicable)  is 33 minutes.   Virl Diamond MD [facility] Personal pager: See Amion If unanswered, please page CCM On-call: #[phone]

## 2023-02-13 NOTE — Procedures (Signed)
[facility]  Long-term EEG Report  Video Performed: Performed   Demographics: Patient Name:   Charles Trujillo, Charles Trujillo  Date of Birth:   25-Jul-1950  Identification Number:   MRN - [id number]  Study Times:  Study Start Time:   02/12/2023 14:00:00 Study End Time:   02/13/2023 02:00:00  Indication(s): Seizures  Medication:  Keppra, propofol, fentanyl  Technical Summary:  This EEG was performed utilizing standard International 10-20 System of electrode placement. One channel electrocardiogram was monitored. Data were obtained and interpreted utilizing referential montage recording, with reformatting to longitudinal, transverse bipolar, and referential montages as necessary for interpretation.  State(s):      Medically Induced Coma   Activation Procedures: Hyperventilation: Not performed Photic Stimulation: Not performed   EEG Description:  EEG background showed an absent of wakeful posterior dominant alpha background rhythm. The background was continuous diffuse delta more than theta slow waves seen.  There was a superimposed continuous focal slowing with polymorphic delta over the right frontal region.  Lateralized periodic discharges (LPDs) with spikes/polyspikes were seen in the right frontal region (F8-Fp2 electrodes) with frequency up 1.0-2.0 Hz. There were EEG evolution to electrographic seizures and gradually more often over this section of study.  The electrographic seizures were averaging once per hour with duration of 1.5 minute. No clinical seizure observed in video.  No button-pushed event noted.  Physiologic sleep stage II sleep spindle and vertex waves were not recorded.    Impression:  This is an abnormal EEG showing:  1. Focal electrographic seizures in the right frontal region and at risk of progression to focal status epilepticus.  2. Focal slowing suggestive of underlying structural abnormality and/or  cortical dysfunction in the right frontal region  3. Severe diffuse encephalopathy background.  The study is ongoing.    Dr Judithann Sheen Ameirah Khatoon    This long-term video EEG is performed and recorded for review by an interpreting physician periodically and is not live continuous monitoring. Reviewing physicians have the ability to access live data at any time during the study if requested. Studies must be automatically or manually uploaded by the performing hospital every 12 hours for review. If the patient experiences clinical changes that may be related to seizure activity, immediately contact the [facility]) 24 hours a day, 7 days a week for a stat EEG review/interpretation. [facility] Contact Number: [phone].  Results conveyed to Dr. Amada Jupiter, MD at 02/13/2023 09:00:48  [facility] For Inpatient follow-up with [facility] physician please call [facility] [phone]. This is not an outpatient service. Post hospital discharge, please contact hospital directly.   Please call or reconsult our service if there are any clinical or diagnostic changes.

## 2023-02-13 NOTE — Consult Note (Signed)
Consultation Note Date: 02/13/2023   Patient Name: Charles Trujillo  DOB: Jan 29, 1951  MRN: [id number]  Age / Sex: 72 y.o., male  PCP: Zola Button, Grayling Congress, DO Referring Physician: Tomma Lightning, MD  Reason for Consultation: Establishing goals of care  HPI/Patient Profile: 72 y.o. male  with past medical history of hypertension, hyperlipidemia, DM, GERD, anxiety, depression, and bladder cancer admitted on 02/11/2023 with seizures.  Patient required intubation for airway protection.  MRI revealed leptomeningeal spread of carcinomatosis.  Patient with status epilepticus related to metastatic disease; neurology adjusting meds.  PMT consulted to discuss goals of care.  Clinical Assessment and Goals of Care: I have reviewed medical records including EPIC notes, labs and imaging, received report from [facility], assessed the patient and then met with patient's spouse Ermalinda Barrios to discuss diagnosis prognosis, GOC, EOL wishes, disposition and options.  I introduced Palliative Medicine as specialized medical care for people living with serious illness. It focuses on providing relief from the symptoms and stress of a serious illness. The goal is to improve quality of life for both the patient and the family.  We discussed a brief life review of the patient.  She tells me she and the patient have been married 26 years.  She tells me about their pets and how much the patient lives them.  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  She repeatedly describes him as "just a good guy".  She tells me about their son Casimiro Needle.  As far as functional and nutritional status she tells me despite Bill's disease he was doing quite well.  He had remained independent.  He recently built a shed.  He was still able to take care of their pool.  She does share very recently his appetite has been a bit off and he had also started having balance  problems.  She tells me how shocking the current situation is.  She tells me they understood the nature of his disease but they expected him to have a slow decline with increasing fatigue as he nears the end of life; current situation completely unexpected.   We discussed patient's current illness and what it means in the larger context of patient's on-going co-morbidities.  Natural disease trajectory and expectations at EOL were discussed.  I attempted to elicit values and goals of care important to the patient.    Ermalinda Barrios shares at this point she is really hopeful we can get to a point where he can safely be freed from ventilator and discharge home to focus on quality of life and comfort. She recognizes we may not be able to achieve this. We briefly discuss how to proceed if we are unable to free Bill from vent.   Ermalinda Barrios shares that quality of life is most important to [place] and he would not want to go through any medical procedure that would not lead to good quality. She shares maintaining dignity is also important.   The difference between aggressive medical intervention and comfort care was considered in light of the patient's goals of care.   We discuss continuing current measures, adjusting meds in an attempt to control seizures with hopes of freeing Bill from ventilator to get him home. We discussed allowing time for outcomes.  We discuss that if [name] were to acutely decline despite current support we would not put him through resuscitative efforts, she confirms DNR.   Discussed with wife the importance of continued conversation with  family and the medical providers regarding overall plan of care and treatment options, ensuring decisions are within the context of the patients values and GOCs.    Offered chaplain support - she is agreeable.   Questions and concerns were addressed. The family was encouraged to call with questions or concerns.  Primary Decision Maker NEXT OF KIN - wife  Jilda    SUMMARY OF RECOMMENDATIONS   - time for outcomes, neurology adjusting medications - DNR - wife hopeful [name] can be safely weaned from vent and then likely go home with support of hospice - PMT will follow closely  - chaplain support  Code Status/Advance Care Planning: DNR   Symptom Management:  Per CCM/neurology at this point      Primary Diagnoses: Present on Admission:  AMS (altered mental status)   I have reviewed the medical record, interviewed the patient and family, and examined the patient. The following aspects are pertinent.  Past Medical History:  Diagnosis Date   Anxiety    Arthritis    bilateral knees   Depression    DM2 (diabetes mellitus, type 2) (HCC)    Dysrhythmia     benign tachycardia, takes Cartia   Elevated LFTs    previously evaluated by Dr Jarold Motto; h/o "fatty liver"   GERD (gastroesophageal reflux disease)    HLD (hyperlipidemia)    Hypertension    Inguinal hernia    right   Squamous cell carcinoma    top of head   Social History   Socioeconomic History   Marital status: Married    Spouse name: Not on file   Number of children: 1   Years of education: Not on file   Highest education level: Not on file  Occupational History   Occupation: [employer]-- Pharmacologist: Transport planner [employer]  Tobacco Use   Smoking status: Some Days    Types: Cigars   Smokeless tobacco: Never   Tobacco comments:    smokes one every other day  Vaping Use   Vaping status: Never Used  Substance and Sexual Activity   Alcohol use: Yes    Alcohol/week: 3.0 - 4.0 standard drinks of alcohol    Types: 3 - 4 Cans of beer per week    Comment: social   Drug use: No   Sexual activity: Yes    Partners: Female  Other Topics Concern   Not on file  Social History Narrative   Married to [place]   exercise--- not regularly   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   Is a  IT consultant   Occasional smoker   Social Determinants of Corporate investment banker Strain: Not on file  Food Insecurity: No Food Insecurity (02/13/2023)   Hunger Vital Sign    Worried About Running Out of Food in the Last Year: Never true    Ran Out of Food in the Last Year: Never true  Transportation Needs: No Transportation Needs (02/13/2023)   PRAPARE - Administrator, Civil Service (Medical): No    Lack of Transportation (Non-Medical): No  Physical Activity: Not on file  Stress: Not on file  Social Connections: Not on file   Family History  Problem Relation Age of Onset   Diabetes Mother    Hypertension Mother    Heart disease Mother    Heart failure Mother    Stroke Father 75   Parkinsonism Father    Depression  Brother    Alcohol abuse Brother    Cancer Maternal Grandmother        bone   Colon cancer Neg Hx    Colon polyps Neg Hx    Esophageal cancer Neg Hx    Rectal cancer Neg Hx    Stomach cancer Neg Hx    Scheduled Meds:  Chlorhexidine Gluconate Cloth  6 each Topical Q0600   dexamethasone (DECADRON) injection  10 mg Intravenous Q24H   heparin  5,000 Units Subcutaneous Q8H   LORazepam  4 mg Intravenous Once   mouth rinse  15 mL Mouth Rinse Q2H   Continuous Infusions:  sodium chloride 75 mL/hr at 02/13/23 0800   famotidine (PEPCID) IV 20 mg (02/13/23 0908)   fosPHENYtoin (CEREBYX) IV 1,500 mg PE (02/13/23 0957)   levETIRAcetam Stopped (02/13/23 0415)   propofol (DIPRIVAN) infusion 20 mcg/kg/min (02/13/23 0952)   PRN Meds:.docusate, fentaNYL (SUBLIMAZE) injection, fentaNYL (SUBLIMAZE) injection, mouth rinse, polyethylene glycol Allergies  Allergen Reactions   Meth-Hyo-M Bl-Na Phos-Ph Sal Other (See Comments)    [facility] believes it may have contributed to renal failure accourding to his spouse who informed us at 17:17 01/19/2021   Codeine Nausea Only   Review of Systems  Unable to perform ROS: Intubated    Physical Exam Constitutional:       Appearance: He is ill-appearing.     Comments: sedated  Pulmonary:     Comments: intubated    Vital Signs: BP 129/71   Pulse 69   Temp 97.9 F (36.6 C) (Bladder)   Resp 18   Ht 5\' 7"  (1.702 m)   Wt 88.9 kg   SpO2 98%   BMI 30.70 kg/m  Pain Scale: CPOT       SpO2: SpO2: 98 % O2 Device:SpO2: 98 % O2 Flow Rate: .   IO: Intake/output summary:  Intake/Output Summary (Last 24 hours) at 02/13/2023 0959 Last data filed at 02/13/2023 0800 Gross per 24 hour  Intake 1425.57 ml  Output 1080 ml  Net 345.57 ml    LBM: Last BM Date :  (pta) Baseline Weight: Weight: 86 kg Most recent weight: Weight: 88.9 kg       *Please note that this is a verbal dictation therefore any spelling or grammatical errors are due to the "Dragon Medical One" system interpretation.   Gerlean Ren, DNP, AGNP-C Palliative Medicine Team [phone] Pager: [phone]

## 2023-02-13 NOTE — Progress Notes (Signed)
Subjective: Has had actual evolving seizures on EEG.  Exam: Vitals:   02/13/23 0825 02/13/23 1139  BP: 129/71 104/70  Pulse: 69 77  Resp: 18 19  Temp:    SpO2: 98% 94%   Gen: In bed, intubated  Neuro: MS: He does not open eyes or follow commands, though he did with sedation paused yesterday EA:VWUJW, eyes midline Motor: withdraws to noxious stimulation R > L  Sensory:as above.  Pertinent Labs: Cr 0.92 Mg is borderline.   Impression: 72 yo M with newly diagnosed cerebral metastasis with leptomeningeal spread. He has now responded to Keppra, and I would favor more aggressive antiepileptic therapy at this time.  With the focal nature, progression to burst suppression may not be indicated, especially in the setting of his poor prognosis given leptomeningeal spread of cancer for which he has exhausted treatment options.   Recommendations: 1) fosphenytoin 20/kg x 1 followed by phenytoin 100 mg 3 times daily 2) Keppra 500 mg twice daily 3) Ativan 4 mg, followed by repeat 4 mg pattern continues 4) Especially given underlying poor prognosis and focal nature of his discharges, would not favor progressing to burst suppression, etc, even it it reveals itself to be an ictal pattern.  5) neurology to follow.   This patient is critically ill and at significant risk of neurological worsening, death and care requires constant monitoring of vital signs, hemodynamics,respiratory and cardiac monitoring, neurological assessment, discussion with family, other specialists and medical decision making of high complexity. I spent 45 minutes of neurocritical care time  in the care of  this patient. This was time spent independent of any time provided by nurse practitioner or PA.  Ritta Slot, MD [facility] [phone]  If 7pm- 7am, please page neurology on call as listed in AMION. 02/13/2023  1:13 PM

## 2023-02-13 NOTE — Procedures (Signed)
[facility]  Long-term EEG Report  Video Performed: Performed   Demographics: Patient Name:   Charles Trujillo, Charles Trujillo  Date of Birth:   April 10, 1951  Identification Number:   MRN - [id number]  Study Times:  Study Start Time:   02/13/2023 02:00:00 Study End Time:   02/13/2023 14:00:00  Indication(s): Seizures, Encephalopathy  Medication:  Keppra, propofol, fentanyl, fosphenytoin, lacosamide  Technical Summary:  This EEG was performed utilizing standard International 10-20 System of electrode placement. One channel electrocardiogram was monitored. Data were obtained and interpreted utilizing referential montage recording, with reformatting to longitudinal, transverse bipolar, and referential montages as necessary for interpretation.  State(s):      Medically Induced Coma   Activation Procedures: Hyperventilation: Not performed Photic Stimulation: Not performed   EEG Description:  EEG background showed an absent of wakeful posterior dominant alpha background rhythm. The background was continuous diffuse delta more than theta slow waves seen.  There was a superimposed continuous focal slowing with polymorphic delta over the right frontal region.  Lateralized periodic discharges (LPDs) with spikes/polyspikes were seen in the right frontal region (F8-Fp2 electrodes) with frequency up 1.0-2.0 Hz. There were EEG evolution to electrographic seizures but less often after 12 PM.  No clinical seizure observed in video.  No button-pushed event noted.  Physiologic sleep stage II sleep spindle and vertex waves were not recorded.  Impression:  This is an abnormal EEG showing:   1. Focal electrographic seizures in the right frontal region that are overall less appearing comparing to prior 12-hr study.   2. Focal slowing suggestive of underlying structural abnormality and/or cortical dysfunction in the right frontal region   3. Severe  diffuse encephalopathy background.   The study is ongoing.    Dr Judithann Sheen Katrell Milhorn    This long-term video EEG is performed and recorded for review by an interpreting physician periodically and is not live continuous monitoring. Reviewing physicians have the ability to access live data at any time during the study if requested. Studies must be automatically or manually uploaded by the performing hospital every 12 hours for review. If the patient experiences clinical changes that may be related to seizure activity, immediately contact the [facility]) 24 hours a day, 7 days a week for a stat EEG review/interpretation. [facility] Contact Number: [phone].  [facility] For Inpatient follow-up with [facility] physician please call [facility] [phone]. This is not an outpatient service. Post hospital discharge, please contact hospital directly.   Please call or reconsult our service if there are any clinical or diagnostic changes.

## 2023-02-13 NOTE — Progress Notes (Signed)
eLink Physician-Brief Progress Note Patient Name: Charles Trujillo DOB: [birth year] MRN: [id number]   Date of Service  02/13/2023  HPI/Events of Note  CBG climbing since being started on Tube feeds. Request for sliding scale orders. Has hx of DM2  Most recent CBG 195 Creatinine 0.92 improved Tolerating tube feeding  eICU Interventions  Moderate dose sliding scale insulin ordered q 4     Intervention Category Intermediate Interventions: Hyperglycemia - evaluation and treatment  Rosalie Gums Silvina Hackleman 02/13/2023, 8:31 PM

## 2023-02-14 DIAGNOSIS — R569 Unspecified convulsions: Secondary | ICD-10-CM | POA: Diagnosis not present

## 2023-02-14 DIAGNOSIS — Z7189 Other specified counseling: Secondary | ICD-10-CM | POA: Diagnosis not present

## 2023-02-14 DIAGNOSIS — Z66 Do not resuscitate: Secondary | ICD-10-CM | POA: Diagnosis not present

## 2023-02-14 DIAGNOSIS — Z515 Encounter for palliative care: Secondary | ICD-10-CM | POA: Diagnosis not present

## 2023-02-14 DIAGNOSIS — R4182 Altered mental status, unspecified: Secondary | ICD-10-CM | POA: Diagnosis not present

## 2023-02-14 LAB — GLUCOSE, CAPILLARY
Glucose-Capillary: 157 mg/dL — ABNORMAL HIGH (ref 70–99)
Glucose-Capillary: 184 mg/dL — ABNORMAL HIGH (ref 70–99)
Glucose-Capillary: 210 mg/dL — ABNORMAL HIGH (ref 70–99)
Glucose-Capillary: 222 mg/dL — ABNORMAL HIGH (ref 70–99)
Glucose-Capillary: 201 mg/dL — ABNORMAL HIGH (ref 70–99)
Glucose-Capillary: 181 mg/dL — ABNORMAL HIGH (ref 70–99)

## 2023-02-14 MED ORDER — OSMOLITE 1.5 CAL PO LIQD
1000.0000 mL | ORAL | Status: DC
  Administered 2023-02-15: 1000 mL

## 2023-02-14 MED ORDER — PERAMPANEL 8 MG PO TABS
16.0000 mg | ORAL_TABLET | Freq: Once | ORAL | Status: AC
  Administered 2023-02-14: 16 mg
  Filled 2023-02-14: qty 2

## 2023-02-14 MED ORDER — POTASSIUM PHOSPHATES 15 MMOLE/5ML IV SOLN
15.0000 mmol | Freq: Once | INTRAVENOUS | Status: AC
  Administered 2023-02-14: 15 mmol via INTRAVENOUS
  Filled 2023-02-14: qty 5

## 2023-02-14 MED ORDER — INSULIN ASPART 100 UNIT/ML IJ SOLN
0.0000 [IU] | INTRAMUSCULAR | Status: DC
  Administered 2023-02-14 (×2): 5 [IU] via SUBCUTANEOUS
  Administered 2023-02-14: 3 [IU] via SUBCUTANEOUS
  Administered 2023-02-14: 5 [IU] via SUBCUTANEOUS
  Administered 2023-02-15: 3 [IU] via SUBCUTANEOUS
  Administered 2023-02-15: 5 [IU] via SUBCUTANEOUS
  Administered 2023-02-15 (×3): 2 [IU] via SUBCUTANEOUS
  Administered 2023-02-15 – 2023-02-16 (×2): 3 [IU] via SUBCUTANEOUS

## 2023-02-14 MED ORDER — PIVOT 1.5 CAL PO LIQD
1000.0000 mL | ORAL | Status: AC
  Administered 2023-02-14: 1000 mL

## 2023-02-14 MED ORDER — PERAMPANEL 8 MG PO TABS: 16.0000 mg | ORAL_TABLET | Freq: Once | ORAL | Status: DC

## 2023-02-14 MED ORDER — INSULIN ASPART 100 UNIT/ML IJ SOLN
3.0000 [IU] | INTRAMUSCULAR | Status: DC
  Administered 2023-02-14 – 2023-02-16 (×11): 3 [IU] via SUBCUTANEOUS

## 2023-02-14 NOTE — Progress Notes (Signed)
This chaplain responded to PMT NP-Shae consult for spiritual care. The chaplain introduced herself to the Pt. Wife-Jilda and son-Michael at the Pt. bedside.   The chaplain understands the family is declining a visit at this time. The chaplain offered F/U spiritual care as needed.  Chaplain Stephanie Acre [phone]

## 2023-02-14 NOTE — Progress Notes (Signed)
Initial Nutrition Assessment  DOCUMENTATION CODES:   Not applicable  INTERVENTION:   Pivot 1.5 @ 40 ml/hr  Osmolite 1.5 @ 55 ml/hr  60 ml ProSource TF20 daily  Provides: 2060 kcal, 102 grams of protein, and 1003 ml free water  NUTRITION DIAGNOSIS:   Inadequate oral intake related to inability to eat as evidenced by NPO status.  GOAL:   Patient will meet greater than or equal to 90% of their needs  MONITOR:   TF tolerance  REASON FOR ASSESSMENT:   Consult Enteral/tube feeding initiation and management  ASSESSMENT:   Pt with PMH of HTN, HLD, DM, GERD, anxiety, depression and bladder cancer admitted with seizures.   Palliative following.   Spoke with wife who reports during chemo/XRT (last in 11/2022) pt lost weight. He was ~ 200 lb and lost down to 170 lb. He is back up to 190's per wife.  He eats a pack of donuts for breakfast, lunch has been reduced to a bowl of cereal, dinner is a bowl of soup. Per wife appetite worse in the last 2 weeks.   Medications reviewed and include: SSI, 3 units novolog every 4 hours Pepcid Kphos x 1  Propofol @ 10 ml/hr provides: 264 kcal   Labs reviewed:  CBG's: 157-222  18 F OG tube; tip in stomach   NUTRITION - FOCUSED PHYSICAL EXAM:  Flowsheet Row Most Recent Value  Orbital Region Severe depletion  Upper Arm Region Moderate depletion  Thoracic and Lumbar Region No depletion  Buccal Region Unable to assess  Temple Region Severe depletion  Clavicle Bone Region No depletion  Clavicle and Acromion Bone Region No depletion  Scapular Bone Region Unable to assess  Dorsal Hand No depletion  Patellar Region No depletion  Anterior Thigh Region No depletion  Posterior Calf Region No depletion  Edema (RD Assessment) None  Hair Reviewed  Eyes Unable to assess  Mouth Unable to assess  Skin Reviewed  Nails Reviewed       Diet Order:   Diet Order             Diet NPO time specified  Diet effective now                    EDUCATION NEEDS:   Not appropriate for education at this time  Skin:  Skin Assessment: Reviewed RN Assessment  Last BM:  unknown  Height:   Ht Readings from Last 1 Encounters:  02/11/23 5\' 7"  (1.702 m)    Weight:   Wt Readings from Last 1 Encounters:  02/14/23 92.3 kg    BMI:  Body mass index is 31.87 kg/m.  Estimated Nutritional Needs:   Kcal:  2000-2200  Protein:  105-115 grams  Fluid:  >2 L/day  Cammy Copa., RD, LDN, CNSC See AMiON for contact information

## 2023-02-14 NOTE — Progress Notes (Signed)
Pharmacy Electrolyte Replacement  Recent Labs:  Recent Labs    02/14/23 0808  K 3.8  MG 2.3  PHOS 2.4*  CREATININE 1.11    Low Critical Values (K </= 2.5, Phos </= 1, Mg </= 1) Present: None  Plan: Give Kphos. Recheck in AM.   Estill Batten, PharmD, BCCCP

## 2023-02-14 NOTE — Procedures (Signed)
[facility]  Long-term EEG Report  Video Performed: Performed   Demographics: Patient Name:   Charles Trujillo, Charles Trujillo  Date of Birth:   02-25-1951  Identification Number:   MRN - [id number]  Study Times:  Study Start Time:   02/14/2023 02:00:00 Study End Time:   02/14/2023 14:00:00  Indication(s): Seizures  Technical Summary:  This EEG was performed utilizing standard International 10-20 System of electrode placement. One channel electrocardiogram was monitored. Data were obtained and interpreted utilizing referential montage recording, with reformatting to longitudinal, transverse bipolar, and referential montages as necessary for interpretation.  State(s):      Medically Induced Coma   Activation Procedures: Hyperventilation: Not performed Photic Stimulation: Not performed   EEG Description:  EEG background showed an absent of wakeful posterior dominant alpha rhythm. There was continuous of delta intermixed theta activity noted diffusely in the background.  Focal slow and periodic spikes and polyspikes activity with frequency of 1-2 Hz were observed over the right frontal region. No clear EEG evolution into electrographic seizure noted.  Physiologic sleep stage II sleep spindle and vertex waves were not recorded.  There was no clinical event or button-pushed event noted.  Few electrode artifacts were noted.  Impression:  This is an abnormal EEG showing:  1. Potential epileptiform focus in the right frontal region in the category of interictal-ictal continuum. No evidence of seizure or status epilepticus but at risk.  2. Baseline severe diffuse encephalopathy. This is nonspecific in etiology and could be from structural abnormality, metabolic/toxic and/or sedative medication effect and among others.  This is unchanged from prior 12-hr EEG.  The study is ongoing.    Dr Judithann Sheen     This long-term video EEG  is performed and recorded for review by an interpreting physician periodically and is not live continuous monitoring. Reviewing physicians have the ability to access live data at any time during the study if requested. Studies must be automatically or manually uploaded by the performing hospital every 12 hours for review. If the patient experiences clinical changes that may be related to seizure activity, immediately contact the [facility]) 24 hours a day, 7 days a week for a stat EEG review/interpretation. [facility] Contact Number: [phone].  [facility] For Inpatient follow-up with [facility] physician please call [facility] [phone]. This is not an outpatient service. Post hospital discharge, please contact hospital directly.   Please call or reconsult our service if there are any clinical or diagnostic changes.

## 2023-02-14 NOTE — Progress Notes (Signed)
Leads maint, no skin breakdown

## 2023-02-14 NOTE — CV Procedure (Signed)
[facility]  Long-term EEG Report  Video Performed: Performed   Demographics: Patient Name:   Roark, Rufo  Date of Birth:   05/26/1951  Identification Number:   MRN - [id number]  Study Times:  Study Start Time:   02/13/2023 14:00:00 Study End Time:   02/14/2023 02:00:00  Indication(s): Seizures, Encephalopathy  Medication:  Keppra, Dilantin, Vimpat  Technical Summary:  This EEG was performed utilizing standard International 10-20 System of electrode placement. One channel electrocardiogram was monitored. Data were obtained and interpreted utilizing referential montage recording, with reformatting to longitudinal, transverse bipolar, and referential montages as necessary for interpretation.  State(s):      Medically Induced Coma   Activation Procedures: Hyperventilation: Not performed Photic Stimulation: Not performed   EEG Description:  EEG background showed an absent of wakeful posterior dominant alpha. The background was almost discontinuous with diffuse suppression and frequent right frontal spikes/polyspikes.  Lateralized periodic discharges (LPDs) with spikes/sharply contour waves were seen with frequency of 1-1.5Hz  on average waxing and waning throughout the study. There was no EEG evolution to electrographic seizure but in the category of interictal-ictal continuum.  There was no clinical event or button-pushed event noted.  Physiologic sleep stage II sleep spindle and vertex waves were not recorded.  Few electrode artifacts were noted.  Impression:  This is an abnormal EEG showing:  1. Epileptogenic focus in the right frontal region.  There is no electrographic seizures or evidence of status epilepticus present. The study is in a category of interictal-ictal continuum.  2. Focal slowing suggestive of underlying structural abnormality and/or cortical dysfunction in the right frontal region  3.  Background severe diffuse encephalopathy.      Dr Judithann Sheen     This long-term video EEG is performed and recorded for review by an interpreting physician periodically and is not live continuous monitoring. Reviewing physicians have the ability to access live data at any time during the study if requested. Studies must be automatically or manually uploaded by the performing hospital every 12 hours for review. If the patient experiences clinical changes that may be related to seizure activity, immediately contact the [facility]) 24 hours a day, 7 days a week for a stat EEG review/interpretation. [facility] Contact Number: [phone].  [facility] For Inpatient follow-up with [facility] physician please call [facility] [phone]. This is not an outpatient service. Post hospital discharge, please contact hospital directly.   Please call or reconsult our service if there are any clinical or diagnostic changes.

## 2023-02-14 NOTE — Progress Notes (Signed)
Subjective: Seizures improved since starting fosphenytoin, he has had an ictal-interictal continuum pattern since that time, no real benefit from lacosamide.  Exam: Vitals:   02/14/23 0900 02/14/23 1000  BP: 110/62 113/61  Pulse: 74 73  Resp: (!) 21 (!) 21  Temp: 98.1 F (36.7 C) 98.2 F (36.8 C)  SpO2: 96% 97%   Gen: In bed, intubated  Neuro: MS: He does not open eyes or follow commands UJ:[id number], eyes midline Motor: withdraws to noxious stimulation R > L  Sensory:as above.  Pertinent Labs: Cr 0.92 Mg is borderline.   Impression: 72 yo M with newly diagnosed cerebral metastasis with leptomeningeal spread. He has now responded to Keppra, and I would favor more aggressive antiepileptic therapy at this time.  With the focal nature, progression to burst suppression may not be indicated, especially in the setting of his poor prognosis given leptomeningeal spread of cancer for which he has exhausted treatment options.  At this point, the main goal will be weaning him to get him home for comfort care, and I would favor weaning sedation to see what his mental status is like currently.  I am not sure he got much benefit from lacosamide and therefore I would discontinue that at this time.  May consider addition of perampanel   Recommendations: 1) phenytoin 100 mg 3 times daily 2) Keppra 1500 mg twice daily 3) neurology to follow  This patient is critically ill and at significant risk of neurological worsening, death and care requires constant monitoring of vital signs, hemodynamics,respiratory and cardiac monitoring, neurological assessment, discussion with family, other specialists and medical decision making of high complexity. I spent 45 minutes of neurocritical care time  in the care of  this patient. This was time spent independent of any time provided by nurse practitioner or PA.  Ritta Slot, MD [facility] [phone]  If 7pm- 7am, please page neurology on  call as listed in AMION. 02/14/2023  10:34 AM

## 2023-02-14 NOTE — Progress Notes (Signed)
NAME:  Charles Trujillo, MRN:  [id number], DOB:  April 21, 1951, LOS: 3 ADMISSION DATE:  02/11/2023, CONSULTATION DATE:  02/14/23  REFERRING MD:  ED, CHIEF COMPLAINT:  AMS   History of Present Illness:  Charles Trujillo is a 72 year old male with past medical history notable for hypertension, hyperlipidemia, DM, GERD, anxiety and depression, bladder cancer.  Patient presented to [facility] emergency department today 02/11/2023 via EMS after experiencing a grand mal seizure at home per EMS.  Last known well was at 1:30 PM today.  Wife is with him and left the room and when she came back she found him having a seizure on the floor.  She had called EMS and they activated a code stroke at that time.  Initially on EMS evaluation patient was found hypertensive 200s/100s and hypoglycemic to 51.  Per EMS en route to hospital he had a focal seizure involving his mouth and then had a grand mal seizure he was given 2.5 mg Versed.  In the emergency department CT head showed to be abnormal edema in the right frontal lobe with thickened and dense adjacent cortex.  Could potentially be due to acute/subacute infarct with petechial hemorrhage or underlying lesion.  Patient required intubation after CT for airway protection.  Also was given 2 g IV Keppra and stat EEG ordered.  CTA head and neck with no LVO.  PCCM was consulted at that time for admission  Pertinent  Medical History  hypertension, hyperlipidemia, DM, GERD, anxiety and depression, bladder cancer  Significant Hospital Events: Including procedures, antibiotic start and stop dates in addition to other pertinent events   7/30- admitted to ICU, grand mal seizure and CT head with ? subacute/acute infarct. Intubated for airway protection. 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 rate (!) 21, height 5\' 7"  (1.702 m), weight 92.3 kg, SpO2 96%.    Vent Mode: PRVC FiO2 (%):  [40 %] 40 % Set Rate:  [18 bmp] 18 bmp Vt Set:  [520 mL] 520 mL PEEP:  [5 cmH20] 5 cmH20 Plateau Pressure:  [15 cmH20-17 cmH20] 16 cmH20   Intake/Output Summary (Last 24 hours) at 02/14/2023 1011 Last data filed at 02/14/2023 0900 Gross per 24 hour  Intake 4210.4 ml  Output 1055 ml  Net 3155.4 ml   Filed Weights   02/11/23 1552 02/13/23 0500 02/14/23 0500  Weight: 86 kg 88.9 kg 92.3 kg    Examination: Appearance -chronically ill-appearing ENMT -endotracheal tube in place Neck -neck collar in place Respiratory -clear breath sounds CV -S1-S2 appreciated GI -bowel sounds appreciated Skin -skin is warm and dry Neuro -unresponsive   I reviewed nursing notes, Consultant notes, last 24 h vitals and pain scores, last 48 h intake and output, last 24 h labs and trends, and last 24 h imaging results.  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  Acute hypoxemic respiratory failure -Continue mechanical ventilation -Target TVol 6-8cc/kgIBW -Target Plateau Pressure < 30cm H20 -Target  driving pressure less than 15 cm of water -Target PaO2 55-65: titrate PEEP/FiO2 per protocol -As long as PaO2 to FiO2 ratio is less than 1:150 position in prone position for 16 hours a day -Ventilator associated pneumonia prevention protocol  History of bladder cancer with metastatic disease -Recent CT with retroperitoneal adenopathy -MRI with  leptomeningeal spread  Acute kidney injury -Continue to maintain renal perfusion -Avoid nephrotoxic medications  He does have a c-collar in place -No evidence of fracture -No evidence of acute injury on C-spine 02/11/2023 -Patient has been weaned off sedation, will clarify no pain or discomfort and then c-collar may be removed  Discussed with spouse at bedside   Best Practice (right click and "Reselect all SmartList Selections" daily)   Diet/type: tubefeeds DVT prophylaxis: prophylactic heparin  GI prophylaxis: H2B Lines: N/A Foley:  Yes, and it is still needed Code Status:  DNR Last date of multidisciplinary goals of care discussion [02/14/23 ]  Labs   CBC: Recent Labs  Lab 02/11/23 1430 02/11/23 1439 02/11/23 1548 02/12/23 0542  WBC 17.8*  --   --  8.7  NEUTROABS 10.9*  --   --   --   HGB 14.3 16.0 13.9 13.2  HCT 48.1 47.0 41.0 39.3  MCV 96.6  --   --  87.5  PLT 321  --   --  176    Basic Metabolic Panel: Recent Labs  Lab 02/11/23 1430 02/11/23 1439 02/11/23 1548 02/12/23 0542 02/13/23 1648 02/14/23 0808  NA 136 135 135 134*  --  135  K 3.4* 3.4* 3.5 4.9  --  3.8  CL 98 100  --  103  --  107  CO2 14*  --   --  20*  --  17*  GLUCOSE 171* 162*  --  108*  --  190*  BUN 16 16  --  12  --  16  CREATININE 1.46* 1.20  --  0.92  --  1.11  CALCIUM 9.0  --   --  8.5*  --  7.7*  MG  --   --   --  1.7 1.8 2.3  PHOS  --   --   --  3.2 3.7 2.4*   GFR: Estimated Creatinine Clearance: 64.2 mL/min (by C-G formula based on SCr of 1.11 mg/dL). Recent Labs  Lab 02/11/23 1430 02/12/23 0542  WBC 17.8* 8.7    Liver Function Tests: Recent Labs  Lab 02/11/23 1430  AST 56*  ALT 42  ALKPHOS 61  BILITOT 0.7  PROT 7.3  ALBUMIN 3.9   No results for input(s): "LIPASE", "AMYLASE" in the last 168 hours. No results for input(s): "AMMONIA" in the last 168 hours.  ABG    Component Value Date/Time   PHART 7.397 02/11/2023 1548   PCO2ART 34.2 02/11/2023 1548    PO2ART 396 (H) 02/11/2023 1548   HCO3 21.1 02/11/2023 1548   TCO2 22 02/11/2023 1548   ACIDBASEDEF 3.0 (H) 02/11/2023 1548   O2SAT 100 02/11/2023 1548     Coagulation Profile: Recent Labs  Lab 02/11/23 1430  INR 1.3*    Cardiac Enzymes: No results for input(s): "CKTOTAL", "CKMB", "CKMBINDEX", "TROPONINI" in the last 168 hours.  HbA1C: Hgb A1c MFr Bld  Date/Time Value Ref Range Status  08/27/2022 12:01 PM 6.6 (H) 4.6 - 6.5 % Final    Comment:    Glycemic Control Guidelines for People with Diabetes:Non Diabetic:  <6%Goal of Therapy: <7%Additional Action Suggested:  >8%   01/18/2022 03:05 PM 7.3 (  H) <5.7 % of total Hgb Final    Comment:    For someone without known diabetes, a hemoglobin A1c value of 6.5% or greater indicates that they may have  diabetes and this should be confirmed with a follow-up  test. . For someone with known diabetes, a value <7% indicates  that their diabetes is well controlled and a value  greater than or equal to 7% indicates suboptimal  control. A1c targets should be individualized based on  duration of diabetes, age, comorbid conditions, and  other considerations. . Currently, no consensus exists regarding use of hemoglobin A1c for diagnosis of diabetes for children. .     CBG: Recent Labs  Lab 02/13/23 1558 02/13/23 1926 02/13/23 2311 02/14/23 0317 02/14/23 0744  GLUCAP 190* 195* 215* 157* 184*    Review of Systems:   Negative except as listed in HPI and POC.  Past Medical History:  He,  has a past medical history of Anxiety, Arthritis, Depression, DM2 (diabetes mellitus, type 2) (HCC), Dysrhythmia, Elevated LFTs, GERD (gastroesophageal reflux disease), HLD (hyperlipidemia), Hypertension, Inguinal hernia, and Squamous cell carcinoma.   Surgical History:   Past Surgical History:  Procedure Laterality Date   ACHILLES TENDON REPAIR  1999   BLADDER SURGERY     CARPAL TUNNEL RELEASE Right 11/04/2014   Procedure: LIMITED OPEN RIGHT  CARPAL TUNNEL RELEASE;  Surgeon: Dominica Severin, MD;  Location: [facility];  Service: Orthopedics;  Laterality: Right;   CARPAL TUNNEL WITH CUBITAL TUNNEL Left 07/22/2014   Procedure: LEFT CARPAL TUNNEL RELEASE AND CUBITAL TUNNEL RELEASE TRANSPOSITION;  Surgeon: Dominica Severin, MD;  Location: [facility];  Service: Orthopedics;  Laterality: Left;   COLONOSCOPY     EYE SURGERY     laser eye surgery bilat    HERNIA REPAIR  07/2011   left side   INGUINAL HERNIA REPAIR  08/09/2011   Procedure: LAPAROSCOPIC INGUINAL HERNIA;  Surgeon: Clovis Pu. Cornett, MD;  Location: [facility];  Service: General;  Laterality: Right;   KNEE ARTHROSCOPY  1993   left knee   KNEE SURGERY     age 72- right   SQUAMOUS CELL CARCINOMA EXCISION     left knee   TOTAL KNEE ARTHROPLASTY Left 05/04/2019   Procedure: LEFT TOTAL KNEE ARTHROPLASTY;  Surgeon: Valeria Batman, MD;  Location: [facility];  Service: Orthopedics;  Laterality: Left;   TOTAL KNEE ARTHROPLASTY Right 05/09/2020   Procedure: RIGHT TOTAL KNEE ARTHROPLASTY;  Surgeon: Valeria Batman, MD;  Location: [facility];  Service: Orthopedics;  Laterality: Right;     Social History:   reports that he has been smoking cigars. He has never used smokeless tobacco. He reports current alcohol use of about 3.0 - 4.0 standard drinks of alcohol per week. He reports that he does not use drugs.   Family History:  His family history includes Alcohol abuse in his brother; Cancer in his maternal grandmother; Depression in his brother; Diabetes in his mother; Heart disease in his mother; Heart failure in his mother; Hypertension in his mother; Parkinsonism in his father; Stroke (age of onset: 67) in his father. There is no history of Colon cancer, Colon polyps, Esophageal cancer, Rectal cancer, or Stomach cancer.   Allergies Allergies  Allergen Reactions   Meth-Hyo-M Bl-Na Phos-Ph Sal Other (See Comments)    [facility] believes it may have contributed to  renal failure accourding to his spouse who informed us at 17:17 01/19/2021   Codeine Nausea Only    The  patient is critically ill with multiple organ systems failure and requires high complexity decision making for assessment and support, frequent evaluation and titration of therapies, application of advanced monitoring technologies and extensive interpretation of multiple databases. Critical Care Time devoted to patient care services described in this note independent of APP/resident time (if applicable)  is 32 minutes.   Virl Diamond MD [facility] Personal pager: See Amion If unanswered, please page CCM On-call: #[phone]

## 2023-02-14 NOTE — Progress Notes (Signed)
Daily Progress Note   Patient Name: Charles Trujillo       Date: 02/14/2023 DOB: 06-Mar-1951  Age: 72 y.o. MRN#: [id number] Attending Physician: Tomma Lightning, MD Primary Care Physician: Zola Button, Grayling Congress, DO Admit Date: 02/11/2023  Reason for Consultation/Follow-up: Establishing goals of care  Subjective: Remains intubated, wife at bedside - conversation as below  Length of Stay: 3  Current Medications: Scheduled Meds:   bethanechol  10 mg Per Tube TID   Chlorhexidine Gluconate Cloth  6 each Topical Q0600   feeding supplement (PIVOT 1.5 CAL)  1,000 mL Per Tube Q24H   feeding supplement (PROSource TF20)  60 mL Per Tube Daily   heparin  5,000 Units Subcutaneous Q8H   insulin aspart  0-15 Units Subcutaneous Q4H   mouth rinse  15 mL Mouth Rinse Q2H   phenytoin (DILANTIN) IV  100 mg Intravenous Q8H    Continuous Infusions:  famotidine (PEPCID) IV Stopped (02/14/23 1010)   levETIRAcetam Stopped (02/14/23 0320)   potassium PHOSPHATE IVPB (in mmol) 43 mL/hr at 02/14/23 1200   propofol (DIPRIVAN) infusion 20 mcg/kg/min (02/14/23 1200)    PRN Meds: artificial tears, docusate, fentaNYL (SUBLIMAZE) injection, fentaNYL (SUBLIMAZE) injection, mouth rinse, polyethylene glycol  Physical Exam Constitutional:      General: He is not in acute distress.    Appearance: He is ill-appearing.     Comments: sedated  Pulmonary:     Comments: intubated Skin:    General: Skin is warm and dry.            Vital Signs: BP 137/67   Pulse 82   Temp 98.6 F (37 C) (Bladder)   Resp (!) 25   Ht 5\' 7"  (1.702 m)   Wt 92.3 kg   SpO2 97%   BMI 31.87 kg/m  SpO2: SpO2: 97 % O2 Device: O2 Device: Ventilator O2 Flow Rate:    Intake/output summary:  Intake/Output Summary (Last 24 hours) at 02/14/2023  1234 Last data filed at 02/14/2023 1200 Gross per 24 hour  Intake 4446.39 ml  Output 1205 ml  Net 3241.39 ml   LBM: Last BM Date :  (PTA) Baseline Weight: Weight: 86 kg Most recent weight: Weight: 92.3 kg  Patient Active Problem List   Diagnosis Date Noted   AMS (altered mental status) 02/11/2023   Status epilepticus (HCC) 02/11/2023   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) 08/27/2022   Spinal stenosis, lumbar region, with neurogenic claudication 10/05/2021   Mental status change resolved 08/10/2021   Low back pain 06/19/2021   Malignant neoplasm of overlapping sites of bladder (HCC) 08/15/2020   Total knee replacement status, right 05/09/2020   Uncontrolled type 2 diabetes mellitus with hyperglycemia (HCC) 03/14/2020   Degenerative arthritis of right knee 03/09/2020   Arthritis of right ankle 03/09/2020   Pain in right ankle and joints of right foot 10/13/2019   Total knee replacement status, left 05/18/2019   Osteoarthritis of left knee 05/04/2019   Unilateral primary osteoarthritis, left knee 04/20/2019   Hyperlipidemia associated with type 2 diabetes mellitus (HCC) 05/19/2018   Degenerative arthritis of knee, bilateral 06/20/2017   Left inguinal pain 11/06/2015   Dysuria 03/27/2015   Fatigue  03/27/2015   Benign paroxysmal positional vertigo 04/22/2014   Dizziness 04/22/2014   Primary localized osteoarthrosis, lower leg 09/15/2013   Obesity (BMI 30-39.9) 08/19/2013   Other and unspecified hyperlipidemia 02/19/2013   HLD (hyperlipidemia) 03/12/2012   Tachycardia 03/06/2012   Inguinal hernia unilateral, non-recurrent 08/02/2011   Inguinal hernia 06/26/2011   Preventative health care 02/01/2011   Type 2 diabetes mellitus with hyperglycemia, without long-term current use of insulin (HCC) 10/20/2009   CARPAL TUNNEL SYNDROME, BILATERAL 10/03/2009   ERECTILE DYSFUNCTION, ORGANIC, HX OF 02/06/2009   DEPRESSION 01/20/2008   Essential hypertension  01/20/2008   URI 06/25/2007   TRANSAMINASES, SERUM, ELEVATED 02/02/2007    Palliative Care Assessment & Plan   HPI: 72 y.o. male  with past medical history of hypertension, hyperlipidemia, DM, GERD, anxiety, depression, and bladder cancer admitted on 02/11/2023 with seizures.  Patient required intubation for airway protection.  MRI revealed leptomeningeal spread of carcinomatosis.  Patient with status epilepticus related to metastatic disease; neurology adjusting meds.  PMT consulted to discuss goals of care.   Assessment: Follow up today with wife. She has received updates from Dr Wynona Neat this morning. Her goal continues to be to work towards getting patient safely off to ventilator to dc home with support of hospice/focus on comfort. We discuss patient is currently on pressure support, attempting to decrease sedation. Will need to see how he does with waking. She tells me yesterday when sedation was lessened he was able to squeeze her hand.  Participated in life review, she shares about how they met and raising their son together. Family has experienced multiple losses recently. Emotional support provided.   Recommendations/Plan: Wife hopeful [name] can be safely weaned from vent, home likely with support of hospice DNR PMT will follow and support  Code Status: DNR  Discharge Planning: To Be Determined  Care plan was discussed with RN and wife Charles Trujillo  Thank you for allowing the Palliative Medicine Team to assist in the care of this patient.  *Please note that this is a verbal dictation therefore any spelling or grammatical errors are due to the "Dragon [employer] One" system interpretation.  Gerlean Ren, DNP, [facility] Palliative Medicine Team Team Phone # [phone]  Pager [phone]

## 2023-02-15 DIAGNOSIS — R569 Unspecified convulsions: Secondary | ICD-10-CM | POA: Diagnosis not present

## 2023-02-15 DIAGNOSIS — Z7189 Other specified counseling: Secondary | ICD-10-CM | POA: Diagnosis not present

## 2023-02-15 DIAGNOSIS — Z515 Encounter for palliative care: Secondary | ICD-10-CM | POA: Diagnosis not present

## 2023-02-15 DIAGNOSIS — C7931 Secondary malignant neoplasm of brain: Secondary | ICD-10-CM

## 2023-02-15 DIAGNOSIS — G40901 Epilepsy, unspecified, not intractable, with status epilepticus: Secondary | ICD-10-CM

## 2023-02-15 LAB — GLUCOSE, CAPILLARY
Glucose-Capillary: 140 mg/dL — ABNORMAL HIGH (ref 70–99)
Glucose-Capillary: 121 mg/dL — ABNORMAL HIGH (ref 70–99)
Glucose-Capillary: 134 mg/dL — ABNORMAL HIGH (ref 70–99)
Glucose-Capillary: 218 mg/dL — ABNORMAL HIGH (ref 70–99)
Glucose-Capillary: 169 mg/dL — ABNORMAL HIGH (ref 70–99)
Glucose-Capillary: 173 mg/dL — ABNORMAL HIGH (ref 70–99)

## 2023-02-15 LAB — PHOSPHORUS: Phosphorus: 2.3 mg/dL — ABNORMAL LOW (ref 2.5–4.6)

## 2023-02-15 MED ORDER — POTASSIUM PHOSPHATES 15 MMOLE/5ML IV SOLN
15.0000 mmol | Freq: Once | INTRAVENOUS | Status: AC
  Administered 2023-02-15: 15 mmol via INTRAVENOUS
  Filled 2023-02-15: qty 5

## 2023-02-15 MED ORDER — ACETAMINOPHEN 325 MG PO TABS
650.0000 mg | ORAL_TABLET | Freq: Four times a day (QID) | ORAL | Status: DC | PRN
  Administered 2023-02-15 – 2023-02-16 (×3): 650 mg
  Filled 2023-02-15 (×3): qty 2

## 2023-02-15 MED ORDER — PERAMPANEL 8 MG PO TABS
4.0000 mg | ORAL_TABLET | Freq: Every day | ORAL | Status: DC
  Administered 2023-02-15: 4 mg
  Filled 2023-02-15: qty 1

## 2023-02-15 NOTE — Procedures (Signed)
Patient Name: Charles Trujillo  MRN: [id number]  Epilepsy Attending: Windell Norfolk  Referring Physician/Provider: Amada Jupiter Date: 02/15/2023 Start time: 0200 End Time: 1609 Duration:   12 hours and 9 minutes  Patient history: 72 year old man with new diagnosis of brain metastasis present with seizures. EEG for further evaluation  Level of alertness: comatose  AEDs during EEG study: Phenytoin, Levetiracetam,  Fycompa  Technical aspects: This EEG study was done with scalp electrodes positioned according to the 10-20 International system of electrode placement. Electrical activity was reviewed with band pass filter of 1-70Hz , sensitivity of 7 uV/mm, display speed of 92mm/sec with a 60Hz  notched filter applied as appropriate. EEG data were recorded continuously and digitally stored.  Video monitoring was available and reviewed as appropriate.  Description: EEG showed continuous generalized polymorphic sharply contoured delta slowing. There was also right frontal slowing. Lateralized periodic discharges right frontal region, 0.5 to 1 Hz frequency. There were no electrographic seizures seen during this recording. Hyperventilation and photic stimulation were not performed.     ABNORMALITY - Lateralized periodic discharges, maximal right frontal regionm 0.5 to 1 Hz frequency - Continuous slow, generalized - Right frontal focal slowing     IMPRESSION: This is an abnormal EEG suggesting of potential epileptogenicity arising from the right frontal region; cortical dysfunction arising from right frontal region, nonspecific etiology, likely secondary to underlying structural abnormality, and severe diffuse encephalopathy, nonspecific etiology but likely related to sedation, toxic-metabolic etiology.    

## 2023-02-15 NOTE — TOC Progression Note (Signed)
Transition of Care [facility]) - Progression Note    Patient Details  Name: Charles Trujillo MRN: [id number] Date of Birth: [birth year]  Transition of Care [facility]) CM/SW Contact  Dellie Burns [place], [place] Phone Number: 02/15/2023, 2:07 PM  Clinical Narrative:  spoke to pt's wife Charles Trujillo who confirmed request for hospice home placement and is requesting [employer] [employer] [facility]. Wife aware pt will need to be stable for transport after extubation in order to be transferred. Referral made to [facility] with [facility]. SW will follow.   Dellie Burns, MSW, LCSW [phone] (coverage)            Expected Discharge Plan and Services                                               Social Determinants of Health (SDOH) Interventions SDOH Screenings   Food Insecurity: No Food Insecurity (02/13/2023)  Housing: Low Risk  (02/13/2023)  Transportation Needs: No Transportation Needs (02/13/2023)  Utilities: Not At Risk (02/13/2023)  Depression (PHQ2-9): Low Risk  (08/27/2022)  Tobacco Use: High Risk (02/11/2023)    Readmission Risk Interventions     No data to display

## 2023-02-15 NOTE — Procedures (Signed)
[facility]  Long-term EEG Report  Video Performed: Performed   Demographics: Patient Name:   Charles Trujillo, Charles Trujillo  Date of Birth:   Dec 06, 1950  Identification Number:   MRN - [id number]  Study Times:  Study Start Time:   02/14/2023 14:00:00 Study End Time:   02/15/2023 02:00:00  Indication(s): Encephalopathy  Technical Summary:  This EEG was performed utilizing standard International 10-20 System of electrode placement. One channel electrocardiogram was monitored. Data were obtained and interpreted utilizing referential montage recording, with reformatting to longitudinal, transverse bipolar, and referential montages as necessary for interpretation.  State(s):      Medically Induced Coma   Activation Procedures: Hyperventilation: Not performed Photic Stimulation: Not performed   EEG Description:  EEG background showed an absent of wakeful posterior dominant alpha rhythm. There was continuous of delta intermixed theta activity noted diffusely in the background.   Focal slow and periodic spikes and polyspikes activity with frequency of 1-2 Hz were observed over the right frontal region. No clear EEG evolution into electrographic seizure noted.  There was no clear sleep architecture seen.   There was no clinical event or button-pushed event noted.   Few electrode artifacts were noted.    Impression:  This is an abnormal EEG due to lack of normal wakefulness consistent with a nonspecific encephalopathy. The presence of continuous right frontal periodic lateralized epileptiform discharges consistent with an area of increased epileptogenic potential.    Dr Joice Lofts    This long-term video EEG is performed and recorded for review by an interpreting physician periodically and is not live continuous monitoring. Reviewing physicians have the ability to access live data at any time during the study if requested.  Studies must be automatically or manually uploaded by the performing hospital every 12 hours for review. If the patient experiences clinical changes that may be related to seizure activity, immediately contact the [facility]) 24 hours a day, 7 days a week for a stat EEG review/interpretation. [facility] Contact Number: [phone].  [facility] For Inpatient follow-up with [facility] physician please call [facility] [phone]. This is not an outpatient service. Post hospital discharge, please contact hospital directly.   Please call or reconsult our service if there are any clinical or diagnostic changes.

## 2023-02-15 NOTE — Progress Notes (Signed)
[facility] [phone] [facility] hospital liaison note  Received request from [facility] for family interest in [facility].   Chart reviewed and [facility] eligibility is confirmed at this time. Spoke with wife at length and explained services and hospice/[facility] plan of care.   Bed has been offered and accepted for Sunday. Per report of wife plan will be to wean patient off of ventilator support and if stable transport at that time  Please do not hesitate to call with any hospice related questions or concerns.   Thank you for the opportunity to participate in this patient's care.  Thea Gist, Charity fundraiser, [facility] Liaison  [phone]

## 2023-02-15 NOTE — Progress Notes (Signed)
Subjective: Continues to have pleds, with less overrriding activity.   Exam: Vitals:   02/15/23 0700 02/15/23 0800  BP: 127/68 (!) 144/71  Pulse: 87 92  Resp: 19 (!) 21  Temp: 99.7 F (37.6 C) 99.9 F (37.7 C)  SpO2: 94% 96%   Gen: In bed, intubated  Neuro: MS: He attempts to open eyes to voice, but does not follow commands. ON:GEXBM, eyes midline Motor: He withdraws briskly in all four extremities Sensory:as above.   Pertinent Labs: PHT 15, albumin 3.9    Impression: 72 yo M with newly diagnosed cerebral metastasis with leptomeningeal spread.  He continues to have an ictal-interictal continuum pattern, though I do think there has been some improvement.  At this point, if no evolving seizures on EEG overnight, I would favor discontinuing LTM EEG as the main goal at this point is weaning sedation and sedating medications with a goal of getting him extubated so he can go home for comfort care.  If he does not rouse, may need to consider one-way extubation, though I would favor optimizing his chances of a successful extubation first.   Recommendations: 1) phenytoin 100 mg 3 times daily, check level 2) Keppra 1500 mg twice daily 3) perampanel 4mg  daily 4)  neurology to follow  This patient is critically ill and at significant risk of neurological worsening, death and care requires constant monitoring of vital signs, hemodynamics,respiratory and cardiac monitoring, neurological assessment, discussion with family, other specialists and medical decision making of high complexity. I spent 35 minutes of neurocritical care time  in the care of  this patient. This was time spent independent of any time provided by nurse practitioner or PA.  Ritta Slot, MD [facility] [phone]  If 7pm- 7am, please page neurology on call as listed in AMION. 02/15/2023  9:54 AM

## 2023-02-15 NOTE — Progress Notes (Signed)
NAME:  Charles Trujillo, MRN:  [id number], DOB:  August 21, 1950, LOS: 4 ADMISSION DATE:  02/11/2023, CONSULTATION DATE:  02/15/23  REFERRING MD:  ED, CHIEF COMPLAINT:  AMS   History of Present Illness:  Charles Trujillo is a 72 year old male with past medical history notable for hypertension, hyperlipidemia, DM, GERD, anxiety and depression, bladder cancer.  Patient presented to [facility] emergency department today 02/11/2023 via EMS after experiencing a grand mal seizure at home per EMS.  Last known well was at 1:30 PM today.  Wife is with him and left the room and when she came back she found him having a seizure on the floor.  She had called EMS and they activated a code stroke at that time.  Initially on EMS evaluation patient was found hypertensive 200s/100s and hypoglycemic to 51.  Per EMS en route to hospital he had a focal seizure involving his mouth and then had a grand mal seizure he was given 2.5 mg Versed.  In the emergency department CT head showed to be abnormal edema in the right frontal lobe with thickened and dense adjacent cortex.  Could potentially be due to acute/subacute infarct with petechial hemorrhage or underlying lesion.  Patient required intubation after CT for airway protection.  Also was given 2 g IV Keppra and stat EEG ordered.  CTA head and neck with no LVO.  PCCM was consulted at that time for admission  Pertinent  Medical History  hypertension, hyperlipidemia, DM, GERD, anxiety and depression, bladder cancer  Significant Hospital Events: Including procedures, antibiotic start and stop dates in addition to other pertinent events   7/30- admitted to ICU, grand mal seizure and CT head with ? subacute/acute infarct. Intubated for airway protection. MRI pending 7/31-MRI showing leptomeningeal spread of carcinomatosis 8/1-continuous EEG monitoring showing seizures 8/2 - no seizures on long-term EEG, epileptogenic focus in the right frontal region 8/3 no acute events  overnight  Interim History / Subjective:  Sedated on ventilator Wife at bedside and updated  Objective   Blood pressure (!) 144/71, pulse 92, temperature 99.9 F (37.7 C), temperature source Bladder, resp. rate (!) 21, height 5\' 7"  (1.702 m), weight 91.4 kg, SpO2 96%.    Vent Mode: PRVC FiO2 (%):  [40 %] 40 % Set Rate:  [18 bmp] 18 bmp Vt Set:  [520 mL] 520 mL PEEP:  [5 cmH20] 5 cmH20 Pressure Support:  [8 cmH20] 8 cmH20 Plateau Pressure:  [16 cmH20-18 cmH20] 16 cmH20   Intake/Output Summary (Last 24 hours) at 02/15/2023 0831 Last data filed at 02/15/2023 0800 Gross per 24 hour  Intake 1755.47 ml  Output 1280 ml  Net 475.47 ml   Filed Weights   02/13/23 0500 02/14/23 0500 02/15/23 0338  Weight: 88.9 kg 92.3 kg 91.4 kg    Examination: General: Acute on chronic ill-appearing elderly male lying in bed in no acute distress HEENT: ETT, MM pink/moist, PERRL,  Neuro: Sedated on ventilator CV: s1s2 regular rate and rhythm, no murmur, rubs, or gallops,  PULM: Auscultation bilaterally respiratory breath sounds GI: soft, bowel sounds active in all 4 quadrants, non-tender, non-distended Extremities: warm/dry, on edema  Skin: no rashes or lesions  Resolved Hospital Problem list   AKI  Assessment & Plan:   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 Head of bed elevated 30 degrees. Plateau pressures less than  30 cm H20.  Follow intermittent chest x-ray and ABG.   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  It appears that consultation and quality of life outweighed treatment options P: Supportive care Palliative care following  He does have a c-collar in place -No evidence of fracture -No evidence of acute injury on C-spine 02/11/2023 P: C-collar removed as plans are more comfort driven at this time   Goals of care Plan to continue current AEDs and wean propofol infusion to assess for recurrent seizures.  If no sustained seizures occur and patient is able to protect airway will extubate with plans to transition to home hospice.  If unable to tolerate extubation will discuss with family possible need to arrange for in-hospital comfort care.   Best Practice (right click and "Reselect all SmartList Selections" daily)   Diet/type: tubefeeds DVT prophylaxis: prophylactic heparin  GI prophylaxis: H2B Lines: N/A Foley:  Yes, and it is still needed Code Status:  DNR Last date of multidisciplinary goals of care discussion Wife updated at bedside 8/3  CRITICAL CARE Performed by:  D. Harris  Total critical care time: 38 minutes  Critical care time was exclusive of separately billable procedures and treating other patients.  Critical care was necessary to treat or prevent imminent or life-threatening deterioration.  Critical care was time spent personally by me on the following activities: development of treatment plan with patient and/or surrogate as well as nursing, discussions with consultants, evaluation of patient's response to treatment, examination of patient, obtaining history from patient or surrogate, ordering and performing  treatments and interventions, ordering and review of laboratory studies, ordering and review of radiographic studies, pulse oximetry and re-evaluation of patient's condition.   D. Harris, NP-C [facility] Personal contact information can be found on Amion  If no contact or response made please call [phone] 02/15/2023, 8:47 AM

## 2023-02-15 NOTE — Progress Notes (Addendum)
Daily Progress Note   Patient Name: Charles Trujillo       Date: 02/15/2023 DOB: 1951/05/02  Age: 72 y.o. MRN#: [id number] Attending Physician: Charlott Holler, MD Primary Care Physician: Zola Button, Grayling Congress, DO Admit Date: 02/11/2023  Reason for Consultation/Follow-up: Establishing goals of care   Length of Stay: 4  Current Medications: Scheduled Meds:   bethanechol  10 mg Per Tube TID   Chlorhexidine Gluconate Cloth  6 each Topical Q0600   feeding supplement (PROSource TF20)  60 mL Per Tube Daily   heparin  5,000 Units Subcutaneous Q8H   insulin aspart  0-15 Units Subcutaneous Q4H   insulin aspart  3 Units Subcutaneous Q4H   mouth rinse  15 mL Mouth Rinse Q2H   perampanel  4 mg Per Tube QHS   phenytoin (DILANTIN) IV  100 mg Intravenous Q8H    Continuous Infusions:  famotidine (PEPCID) IV Stopped (02/15/23 1119)   feeding supplement (OSMOLITE 1.5 CAL) 55 mL/hr at 02/15/23 1400   levETIRAcetam 1,500 mg (02/15/23 1409)   potassium PHOSPHATE IVPB (in mmol)     propofol (DIPRIVAN) infusion Stopped (02/15/23 1043)    PRN Meds: acetaminophen, artificial tears, docusate, fentaNYL (SUBLIMAZE) injection, fentaNYL (SUBLIMAZE) injection, mouth rinse, polyethylene glycol  Physical Exam Constitutional:      General: He is not in acute distress.    Appearance: He is ill-appearing.     Interventions: He is intubated.  Pulmonary:     Effort: He is intubated.  Skin:    General: Skin is warm and dry.             Vital Signs: BP 138/83   Pulse (!) 122   Temp (!) 101.1 F (38.4 C)   Resp (!) 21   Ht 5\' 7"  (1.702 m)   Wt 91.4 kg   SpO2 95%   BMI 31.56 kg/m  SpO2: SpO2: 95 % O2 Device: O2 Device: Ventilator O2 Flow Rate:    Intake/output summary:  Intake/Output Summary  (Last 24 hours) at 02/15/2023 1507 Last data filed at 02/15/2023 1400 Gross per 24 hour  Intake 1457.65 ml  Output 955 ml  Net 502.65 ml   LBM: Last BM Date :  (PTA) Baseline Weight: Weight: 86 kg Most recent weight: Weight: 91.4 kg       Palliative  Assessment/Data: 10%      Patient Active Problem List   Diagnosis Date Noted   AMS (altered mental status) 02/11/2023   Status epilepticus (HCC) 02/11/2023   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) 08/27/2022   Spinal stenosis, lumbar region, with neurogenic claudication 10/05/2021   Mental status change resolved 08/10/2021   Low back pain 06/19/2021   Malignant neoplasm of overlapping sites of bladder (HCC) 08/15/2020   Total knee replacement status, right 05/09/2020   Uncontrolled type 2 diabetes mellitus with hyperglycemia (HCC) 03/14/2020   Degenerative arthritis of right knee 03/09/2020   Arthritis of right ankle 03/09/2020   Pain in right ankle and joints of right foot 10/13/2019   Total knee replacement status, left 05/18/2019   Osteoarthritis of left knee 05/04/2019   Unilateral primary osteoarthritis, left knee 04/20/2019   Hyperlipidemia associated with type 2 diabetes mellitus (HCC) 05/19/2018   Degenerative arthritis of knee, bilateral 06/20/2017   Left inguinal pain 11/06/2015   Dysuria 03/27/2015   Fatigue 03/27/2015   Benign paroxysmal positional vertigo 04/22/2014   Dizziness 04/22/2014   Primary localized osteoarthrosis, lower leg 09/15/2013   Obesity (BMI 30-39.9) 08/19/2013   Other and unspecified hyperlipidemia 02/19/2013   HLD (hyperlipidemia) 03/12/2012   Tachycardia 03/06/2012   Inguinal hernia unilateral, non-recurrent 08/02/2011   Inguinal hernia 06/26/2011   Preventative health care 02/01/2011   Type 2 diabetes mellitus with hyperglycemia, without long-term current use of insulin (HCC) 10/20/2009   CARPAL TUNNEL SYNDROME, BILATERAL 10/03/2009   ERECTILE DYSFUNCTION, ORGANIC, HX  OF 02/06/2009   DEPRESSION 01/20/2008   Essential hypertension 01/20/2008   URI 06/25/2007   TRANSAMINASES, SERUM, ELEVATED 02/02/2007    Palliative Care Assessment & Plan   Patient Profile: 72 y.o. male with past medical history of hypertension, hyperlipidemia, DM, GERD, anxiety, depression, and bladder cancer admitted on 02/11/2023 with seizures. Patient required intubation for airway protection. 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 PMT consulted to discuss goals of care.   Assessment: Patient remains intubated but is off propofol.   Met with his wife Charles Trujillo today. Her goal is to provide him with the medical treatment that gives him the best chance at being able to get off the ventilator safely. Initially she thought she would try to get him home with hospice, but now she is reconsidering this. She is concerned about being able to care for him at home. Educated her on the differences between home and inpatient hospice. We discussed that inpatient hospice would allow her to take the role of wife during his end of life rather than caregiver.  Provided emotional support as she reflected on their life together. Encouraged her to call PMT with any questions or concerns.  Recommendations/Plan: DNR Plan is to see if patient can be safely weaned from ventilator-- then inpatient hospice? Continued PMT support.  Code Status:    Code Status Orders  (From admission, onward)           Start     Ordered   02/11/23 1559  Do not attempt resuscitation (DNR)  Continuous       Question Answer Comment  If patient has no pulse and is not breathing Do Not Attempt Resuscitation   If patient has a pulse and/or is breathing: Medical Treatment Goals MEDICAL INTERVENTIONS DESIRED: Use advanced airway interventions, mechanical ventilation or cardioversion in appropriate circumstances; Use medication/IV  fluids as indicated; Provide comfort medications; Transfer to Progressive/Stepdown/ICU  as indicated.   Consent: Discussion documented in EHR or advanced directives reviewed      02/11/23 1600          Care plan was discussed with bedside RN  Extensive chart review has been completed prior to meeting with patient/family including labs, vital signs, imaging, progress/consult notes, orders, medications, and available advance directive documents.   Time 50 minutes  Thank you for allowing the Palliative Medicine Team to assist in the care of this patient.   Sherryll Burger, NP  Please contact Palliative Medicine Team phone at [phone] for questions and concerns.

## 2023-02-15 NOTE — Progress Notes (Signed)
LTM EEG discontinued - no skin breakdown at [facility]. GMD (mach) / MB (scalp)

## 2023-02-16 DIAGNOSIS — Z515 Encounter for palliative care: Secondary | ICD-10-CM

## 2023-02-16 DIAGNOSIS — R569 Unspecified convulsions: Principal | ICD-10-CM

## 2023-02-16 DIAGNOSIS — R4182 Altered mental status, unspecified: Secondary | ICD-10-CM | POA: Diagnosis not present

## 2023-02-16 LAB — GLUCOSE, CAPILLARY
Glucose-Capillary: 168 mg/dL — ABNORMAL HIGH (ref 70–99)
Glucose-Capillary: 117 mg/dL — ABNORMAL HIGH (ref 70–99)

## 2023-02-16 MED ORDER — ACETAMINOPHEN 650 MG RE SUPP
650.0000 mg | RECTAL | Status: DC | PRN
  Administered 2023-02-16: 650 mg via RECTAL

## 2023-02-16 MED ORDER — LORAZEPAM 2 MG/ML IJ SOLN: 2.0000 mg | INTRAMUSCULAR | Status: DC | PRN

## 2023-02-16 MED ORDER — ACETAMINOPHEN 650 MG RE SUPP
RECTAL | Status: AC
  Filled 2023-02-16: qty 1

## 2023-02-16 MED ORDER — MORPHINE SULFATE (PF) 2 MG/ML IV SOLN
1.0000 mg | INTRAVENOUS | Status: DC | PRN
  Administered 2023-02-16: 2 mg via INTRAVENOUS
  Administered 2023-02-16: 1 mg via INTRAVENOUS
  Administered 2023-02-16 (×2): 2 mg via INTRAVENOUS
  Filled 2023-02-16 (×4): qty 1

## 2023-02-16 MED ORDER — MORPHINE SULFATE (PF) 2 MG/ML IV SOLN: 1.0000 mg | INTRAVENOUS | Status: DC | PRN

## 2023-02-16 MED ORDER — HALOPERIDOL LACTATE 5 MG/ML IJ SOLN
5.0000 mg | Freq: Four times a day (QID) | INTRAMUSCULAR | Status: DC | PRN
  Administered 2023-02-16: 5 mg via INTRAVENOUS
  Filled 2023-02-16: qty 1

## 2023-02-16 MED ORDER — GLYCOPYRROLATE 0.2 MG/ML IJ SOLN: 0.2000 mg | INTRAMUSCULAR | Status: DC | PRN

## 2023-02-16 MED ORDER — GLYCOPYRROLATE 1 MG PO TABS: 1.0000 mg | ORAL_TABLET | ORAL | Status: DC | PRN

## 2023-02-16 MED ORDER — GLYCOPYRROLATE 0.2 MG/ML IJ SOLN
0.2000 mg | INTRAMUSCULAR | Status: DC | PRN
  Administered 2023-02-16 (×2): 0.2 mg via INTRAVENOUS
  Filled 2023-02-16 (×2): qty 1

## 2023-02-16 MED ORDER — HALOPERIDOL LACTATE 5 MG/ML IJ SOLN: 5.0000 mg | Freq: Four times a day (QID) | INTRAMUSCULAR | Status: DC | PRN

## 2023-02-16 NOTE — Progress Notes (Signed)
[facility] [phone] [facility] Liaison Note  Confirmed with wife, Ermalinda Barrios, she would like to proceed with transfer to [facility] today after extubation of patient this morning.  Patient eligibility was confirmed on 8.3.  Bed available today for transport.  RN--please call report to [facility] at [phone] prior to patient leaving unit.  Please leave all IV's in place for ongoing symptom management needs.  Please send signed and completed DNR with patient at transport.  Thank you, Doreatha Martin, RN [facility] Liaison [phone]

## 2023-02-16 NOTE — Progress Notes (Signed)
Subjective: Much more awake today  Exam: Vitals:   02/16/23 0838 02/16/23 0900  BP:  138/80  Pulse: (!) 111 (!) 113  Resp: 19 (!) 32  Temp: 99.9 F (37.7 C) 99.9 F (37.7 C)  SpO2: 95% 93%   Gen: In bed, intubated  Neuro: MS: He is able to tell me his name and follow some simple commands.  XB:MWUXL, eyes midline Motor: He follows commands bilaterally.  Sensory:as above.   Pertinent Labs: PHT 15, albumin 3.9   Impression: 72 yo M with newly diagnosed cerebral metastasis with leptomeningeal spread. He is having improvement in his mental status which is very good, but unfortunately does not change his long term prognosis. I would favor continuing his current antiepileptic regimen if possible, all agents could be changed to oral if he is able to take pills. He is going to [facility] for inpatient hospice.   Recommendations: 1) phenytoin 100 mg 3 times daily, check level 2) Keppra 1500 mg twice daily 3) perampanel 4mg  daily if available, otherwise could conitnue keppra and phenytoin 4)  please call with further questions or concerns.    Ritta Slot, MD [facility] [phone]  If 7pm- 7am, please page neurology on call as listed in AMION. 02/16/2023  2:13 PM

## 2023-02-16 NOTE — Procedures (Signed)
Extubation Procedure Note  Patient Details:   Name: Charles Trujillo DOB: August 11, 1950 MRN: [id number]   Airway Documentation:    Vent end date: 02/16/23 Vent end time: 0836   Evaluation  O2 sats: stable throughout Complications: No apparent complications Patient did tolerate procedure well. Bilateral Breath Sounds: Diminished   Yes, pt could speak post extubation.  Pt extubated successfully to nasal cannula.  Audrie Lia 02/16/2023, 8:37 AM

## 2023-02-16 NOTE — Discharge Summary (Cosign Needed Addendum)
Physician Discharge Summary      Patient ID: Charles Trujillo MRN: [id number] DOB/AGE: 01/31/1951 72 y.o.  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  Patient presented to [facility] emergency department today 02/11/2023 via EMS after experiencing a grand mal seizure at home per EMS.  Last known well was at 1:30 PM today.  Wife is with him and left the room and when she came back she found him having a seizure on the floor.  She had called EMS and they activated a code stroke at that time.  Initially on EMS evaluation patient was found hypertensive 200s/100s and hypoglycemic to 51.  Per EMS en route to hospital he had a focal seizure involving his mouth and then had a grand mal seizure he was given 2.5 mg Versed.  In the emergency department CT head showed to be abnormal edema in the right frontal lobe with thickened and dense adjacent cortex.  Could potentially be due to acute/subacute infarct with petechial hemorrhage or underlying lesion.  Patient required intubation after CT for airway protection.  Also was given 2 g IV Keppra and stat EEG ordered.  CTA head and neck with no LVO.   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  It appears that consultation and quality of life outweighed treatment options Plan Tolerated one-way extubation a.m. 8/4 and will now transition to inpatient hospice,  Will focus on comfort measures with open visitation.  Will continue antiepileptic medications with as needed benzodiazepines for seizure control  Significant Hospital tests/ studies  7/30- admitted to ICU, grand mal seizure and CT head with ? subacute/acute infarct. Intubated for airway protection. MRI pending 7/31-MRI showing leptomeningeal spread of carcinomatosis 8/1-continuous EEG monitoring showing seizures 8/2 - no seizures on long-term EEG, epileptogenic focus in the right frontal region 8/3 no acute events overnight 8/4 Tolerated extubation, discharging to inpatient hospice   Procedures   Intubated 7/30  Culture data/antimicrobials   N/A   Consults      Discharge Exam: BP 138/80   Pulse (!) 113   Temp 99.9 F (37.7 C)   Resp (!) 32   Ht 5\' 7"  (1.702 m)   Wt 91 kg   SpO2 93%   BMI 31.42 kg/m    General: Acute on chronic ill-appearing elderly male lying in bed in no acute distress HEENT: /AT, MM pink/moist, PERRL,  Neuro: Eyes open,intermittently following commands CV: s1s2 regular rate and rhythm, no murmur, rubs, or gallops,  PULM: Clear to auscultation bilaterally, no increased work of breathing, no added breath sounds GI: soft, bowel sounds active in all 4 quadrants, non-tender, non-distendedno Extremities: warm/dry, no edema  Skin: no rashes or lesions  Labs at discharge   Lab Results  Component Value Date   CREATININE 0.85 02/15/2023   BUN 18 02/15/2023   NA 137 02/15/2023   K 3.6 02/15/2023   CL 105 02/15/2023   CO2 23 02/15/2023   Lab Results  Component Value Date   WBC 8.7 02/12/2023   HGB 13.2 02/12/2023   HCT 39.3 02/12/2023   MCV 87.5  02/12/2023   PLT 176 02/12/2023   Lab Results  Component Value Date   ALT 42 02/11/2023   AST 56 (H) 02/11/2023   ALKPHOS 61 02/11/2023   BILITOT 0.7 02/11/2023   Lab Results  Component Value Date   INR 1.3 (H) 02/11/2023   INR 1.2 05/01/2020   INR 1.1 04/30/2019    Current radiological studies    No results found.  Disposition:    Discharge disposition: 51-Hospice/Medical Facility         Allergies as of 02/16/2023       Reactions   Meth-hyo-m Bl-na Phos-ph Sal Other (See Comments)   [facility] believes it may have contributed to renal failure accourding to his spouse who informed us at 17:17 01/19/2021   Codeine Nausea Only        Medication List     STOP taking these medications    acetaminophen 650 MG CR tablet Commonly known as: TYLENOL   amoxicillin 500 MG capsule Commonly known as: AMOXIL   atorvastatin 40 MG tablet Commonly known as: LIPITOR   dexamethasone 4 MG tablet Commonly known as: DECADRON   diltiazem 120 MG 24 hr capsule Commonly known as: CARDIZEM CD   DULoxetine 20 MG capsule Commonly known as: Cymbalta   gabapentin 100 MG capsule Commonly known as: NEURONTIN   Janumet XR [id number] MG Tb24 Generic drug: SitaGLIPtin-MetFORMIN HCl   metoprolol tartrate 50 MG tablet Commonly known as: LOPRESSOR   ondansetron 8 MG tablet Commonly known as: ZOFRAN   VITAMIN D-3 PO       TAKE these medications    haloperidol lactate 5 MG/ML injection Commonly known as: HALDOL Inject 1 mL (5 mg total) into the vein every 6 (six) hours as needed.   LORazepam 2 MG/ML injection Commonly known as: ATIVAN Inject 1 mL (2 mg total) into the vein every 2 (two) hours as needed for anxiety.   morphine (PF) 2 MG/ML injection Inject 0.5-2 mLs (1-4 mg total) into the vein every 30 (thirty) minutes as needed (for comfort).         Follow-up appointment   None   Discharge Condition:    stable  Signed: Lanier Clam 02/16/2023, 7:29 PM

## 2023-02-16 NOTE — Progress Notes (Signed)
Daily Progress Note   Patient Name: Charles Trujillo       Date: 02/16/2023 DOB: 1951-05-13  Age: 72 y.o. MRN#: [id number] Attending Physician: Charlott Holler, MD Primary Care Physician: Zola Button, Grayling Congress, DO Admit Date: 02/11/2023  Reason for Consultation/Follow-up: Establishing goals of care  Length of Stay: 5  Current Medications: Scheduled Meds:   Chlorhexidine Gluconate Cloth  6 each Topical Q0600   mouth rinse  15 mL Mouth Rinse Q2H   perampanel  4 mg Per Tube QHS   phenytoin (DILANTIN) IV  100 mg Intravenous Q8H    Continuous Infusions:  levETIRAcetam Stopped (02/16/23 0357)    PRN Meds: acetaminophen, artificial tears, docusate, fentaNYL (SUBLIMAZE) injection, glycopyrrolate **OR** glycopyrrolate **OR** glycopyrrolate, mouth rinse, polyethylene glycol  Physical Exam Vitals reviewed.  Constitutional:      General: He is sleeping.     Appearance: He is ill-appearing.  HENT:     Head: Normocephalic and atraumatic.  Pulmonary:     Effort: Pulmonary effort is normal.  Skin:    General: Skin is warm and dry.  Neurological:     Mental Status: He is easily aroused.             Vital Signs: BP 130/75 (BP Location: Right Arm)   Pulse (!) 111   Temp 99.9 F (37.7 C)   Resp 19   Ht 5\' 7"  (1.702 m)   Wt 91 kg   SpO2 95%   BMI 31.42 kg/m  SpO2: SpO2: 95 % O2 Device: O2 Device: Nasal Cannula O2 Flow Rate:    Intake/output summary:  Intake/Output Summary (Last 24 hours) at 02/16/2023 1100 Last data filed at 02/16/2023 0800 Gross per 24 hour  Intake 1829.81 ml  Output 1850 ml  Net -20.19 ml   LBM: Last BM Date :  (PTA) Baseline Weight: Weight: 86 kg Most recent weight: Weight: 91 kg       Palliative Assessment/Data: 20%     Palliative Care Assessment  & Plan   Patient Profile: 72 y.o. male with past medical history of hypertension, hyperlipidemia, DM, GERD, anxiety, depression, and bladder cancer admitted on 02/11/2023 with seizures. Patient required intubation for airway protection. 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 PMT consulted to discuss goals  of care.   Assessment: Patient was successfully extubated this morning. His wife is grateful they waited to extubate him until today. Patient looks comfortable and his wife Charles Trujillo is at his bedside. He opens his eyes when I said good morning and quietly responded before going back to sleep.  We discussed the plan for inpatient hospice as long as he remains stable for transport. Charles Trujillo feels some guilt for not taking him home on hospice. We discussed the full support he would receive with inpatient hospice. Charles Trujillo shares that the patient's worst fear is pain. I reassured her that he will be kept comfortable through his end of life. We also discussed that inpatient hospice will allow both her and her son to provide support and spend time with the patient without being a constant caregiver.   Answered a few questions about inpatient hospice care. Emotional support provided. Encouraged Charles Trujillo to call with any questions or concerns.  Recommendations/Plan: Comfort measures Discharge to [facility] Continued PMT support  Goals of Care and Additional Recommendations: Limitations on Scope of Treatment: Full Comfort Care  Code Status:    Code Status Orders  (From admission, onward)           Start     Ordered   02/16/23 0841  Do not attempt resuscitation (DNR)  Continuous       Question Answer Comment  If patient has no pulse and is not breathing Do Not Attempt Resuscitation   If patient has a pulse and/or is breathing: Medical Treatment Goals COMFORT MEASURES: Keep clean/warm/dry, use  medication by any route; positioning, wound care and other measures to relieve pain/suffering; use oxygen, suction/manual treatment of airway obstruction for comfort; do not transfer unless for comfort needs.   Consent: Discussion documented in EHR or advanced directives reviewed      02/16/23 0844          Care plan was discussed with bedside RN, Janeann Forehand, LCSW, and hospice liasion  Extensive chart review has been completed prior to meeting with family including labs, vital signs, imaging, progress/consult notes, orders, medications, and available advance directive documents.   Time spent 35 minutes  Thank you for allowing the Palliative Medicine Team to assist in the care of this patient.    Sherryll Burger, NP  Please contact Palliative Medicine Team phone at [phone] for questions and concerns.

## 2023-02-16 NOTE — TOC Transition Note (Signed)
Transition of Care [facility]) - CM/SW Discharge Note   Patient Details  Name: Charles Trujillo MRN: [id number] Date of Birth: 1950/08/25  Transition of Care [facility]) CM/SW Contact:  Deatra Robinson, [place] Phone Number: 02/16/2023, 2:16 PM   Clinical Narrative: pt has been accepted to [facility] and they are prepared to admit today. Pt's wife has completed consents for [facility] and is agreeable to dc plan. RN provided with number for report and PTAR arranged for transport. SW signing off at dc.     Dellie Burns, MSW, LCSW [phone] (coverage)        Final next level of care: Hospice Medical Facility Barriers to Discharge: No Barriers Identified   Patient Goals and CMS Choice      Discharge Placement                  Patient to be transferred to facility by: PTAR Name of family member notified: Jilda/wife Patient and family notified of of transfer: 02/16/23  Discharge Plan and Services Additional resources added to the After Visit Summary for                                       Social Determinants of Health (SDOH) Interventions SDOH Screenings   Food Insecurity: No Food Insecurity (02/13/2023)  Housing: Low Risk  (02/13/2023)  Transportation Needs: No Transportation Needs (02/13/2023)  Utilities: Not At Risk (02/13/2023)  Depression (PHQ2-9): Low Risk  (08/27/2022)  Tobacco Use: High Risk (02/11/2023)     Readmission Risk Interventions     No data to display

## 2023-02-16 NOTE — Progress Notes (Signed)
NAME:  Charles Trujillo, MRN:  [id number], DOB:  1950/10/13, LOS: 5 ADMISSION DATE:  02/11/2023, CONSULTATION DATE:  02/16/23  REFERRING MD:  ED, CHIEF COMPLAINT:  AMS   History of Present Illness:  Charles Trujillo is a 72 year old male with past medical history notable for hypertension, hyperlipidemia, DM, GERD, anxiety and depression, bladder cancer.  Patient presented to [facility] emergency department today 02/11/2023 via EMS after experiencing a grand mal seizure at home per EMS.  Last known well was at 1:30 PM today.  Wife is with him and left the room and when she came back she found him having a seizure on the floor.  She had called EMS and they activated a code stroke at that time.  Initially on EMS evaluation patient was found hypertensive 200s/100s and hypoglycemic to 51.  Per EMS en route to hospital he had a focal seizure involving his mouth and then had a grand mal seizure he was given 2.5 mg Versed.  In the emergency department CT head showed to be abnormal edema in the right frontal lobe with thickened and dense adjacent cortex.  Could potentially be due to acute/subacute infarct with petechial hemorrhage or underlying lesion.  Patient required intubation after CT for airway protection.  Also was given 2 g IV Keppra and stat EEG ordered.  CTA head and neck with no LVO.  PCCM was consulted at that time for admission  Pertinent  Medical History  hypertension, hyperlipidemia, DM, GERD, anxiety and depression, bladder cancer  Significant Hospital Events: Including procedures, antibiotic start and stop dates in addition to other pertinent events   7/30- admitted to ICU, grand mal seizure and CT head with ? subacute/acute infarct. Intubated for airway protection. MRI pending 7/31-MRI showing leptomeningeal spread of carcinomatosis 8/1-continuous EEG monitoring showing seizures 8/2 - no seizures on long-term EEG, epileptogenic focus in the right frontal region 8/3 no acute events  overnight 8/4 no acute events overnight, following basic commands on ventilator.  Evaluated by inpatient hospice yesterday and approved for admission pending ability to tolerate extubation  Interim History / Subjective:  Following basic commands on ventilator  Objective   Blood pressure 116/82, pulse (!) 104, temperature 99.7 F (37.6 C), resp. rate 20, height 5\' 7"  (1.702 m), weight 91 kg, SpO2 99%.    Vent Mode: PSV;CPAP FiO2 (%):  [40 %] 40 % Set Rate:  [18 bmp] 18 bmp Vt Set:  [520 mL] 520 mL PEEP:  [5 cmH20] 5 cmH20 Pressure Support:  [10 cmH20] 10 cmH20 Plateau Pressure:  [18 cmH20-21 cmH20] 21 cmH20   Intake/Output Summary (Last 24 hours) at 02/16/2023 0827 Last data filed at 02/16/2023 0700 Gross per 24 hour  Intake 1786.73 ml  Output 1850 ml  Net -63.27 ml   Filed Weights   02/14/23 0500 02/15/23 0338 02/16/23 0343  Weight: 92.3 kg 91.4 kg 91 kg    Examination: General: Acute on chronic ill-appearing elderly male lying in bed on mechanical ventilation in no acute distress HEENT: ETT, MM pink/moist, PERRL,  Neuro: Eyes open and spontaneously moving on ventilator, intermittently following commands CV: s1s2 regular rate and rhythm, no murmur, rubs, or gallops,  PULM: Clear to auscultation bilaterally, no increased work of breathing, no added breath sounds GI: soft, bowel sounds active in all 4 quadrants, non-tender, non-distendedno Extremities: warm/dry, no edema  Skin: no rashes or lesions  Resolved Hospital Problem list   AKI  Assessment & Plan:   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  It appears that consultation and  quality of life outweighed treatment options Plan Will proceed with one-way extubation this a.m. with plans to transition to inpatient hospice if medically stable postextubation.  Will focus on comfort measures with open visitation.  Will continue antiepileptic medications with as needed benzodiazepines for seizure control  Best Practice (right click and "Reselect all SmartList Selections" daily)   Diet/type: tubefeeds DVT prophylaxis: prophylactic heparin  GI prophylaxis: H2B Lines: N/A Foley:  Yes, and it is still needed Code Status:  DNR Last date of multidisciplinary goals of care discussion Wife updated at bedside 8/3  CRITICAL CARE Performed by:  D. Harris  Total critical care time: 37 minutes  Critical care time was exclusive of separately billable procedures and treating other patients.  Critical care was necessary to treat or prevent imminent or life-threatening deterioration.  Critical care was time spent personally by me on the following activities: development of treatment plan with patient and/or surrogate as well as nursing, discussions with consultants, evaluation of patient's response to treatment, examination of patient, obtaining history from patient or surrogate, ordering and performing treatments and interventions, ordering and review of laboratory studies, ordering and review of radiographic studies, pulse oximetry and re-evaluation of patient's condition.   D. Harris, NP-C [facility] Personal contact information can be found on Amion  If no contact or response made please call [phone] 02/16/2023, 8:27 AM

## 2023-03-16 DEATH — deceased

## 2023-05-28 IMAGING — DX DG ANKLE COMPLETE 3+V*R*
3 series · 3 of 3 positions shown · non-contrast
Comparison: 10/13/2019.

CLINICAL DATA: Generalized right ankle pain.  No injury.

EXAM:
RIGHT ANKLE - COMPLETE 3+ VIEW

[ankle ap]
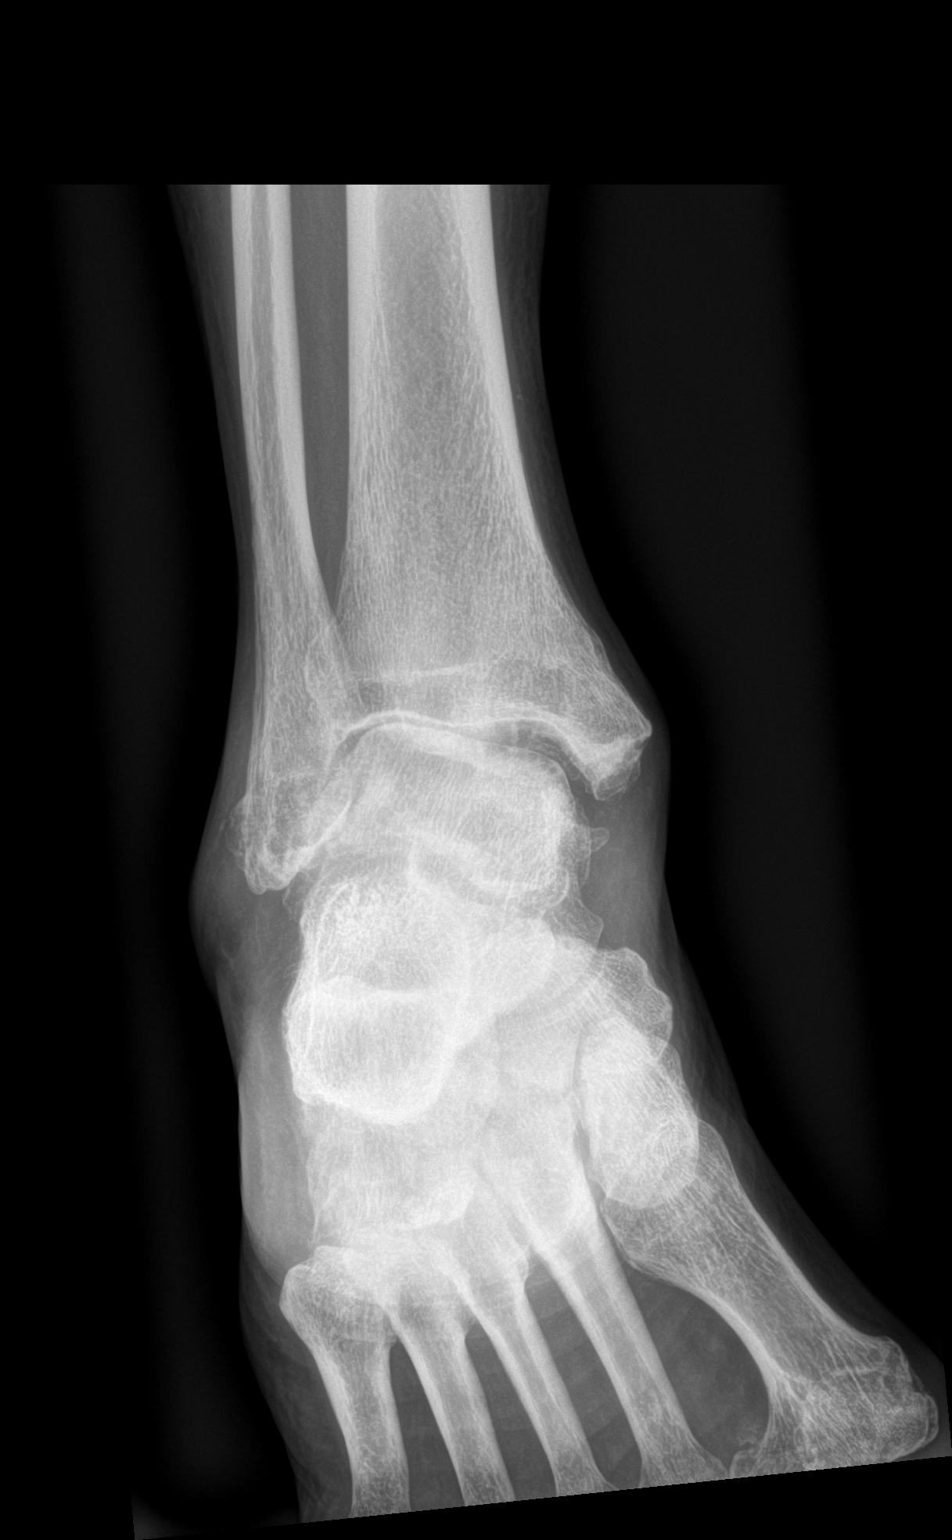

[ankle obl]
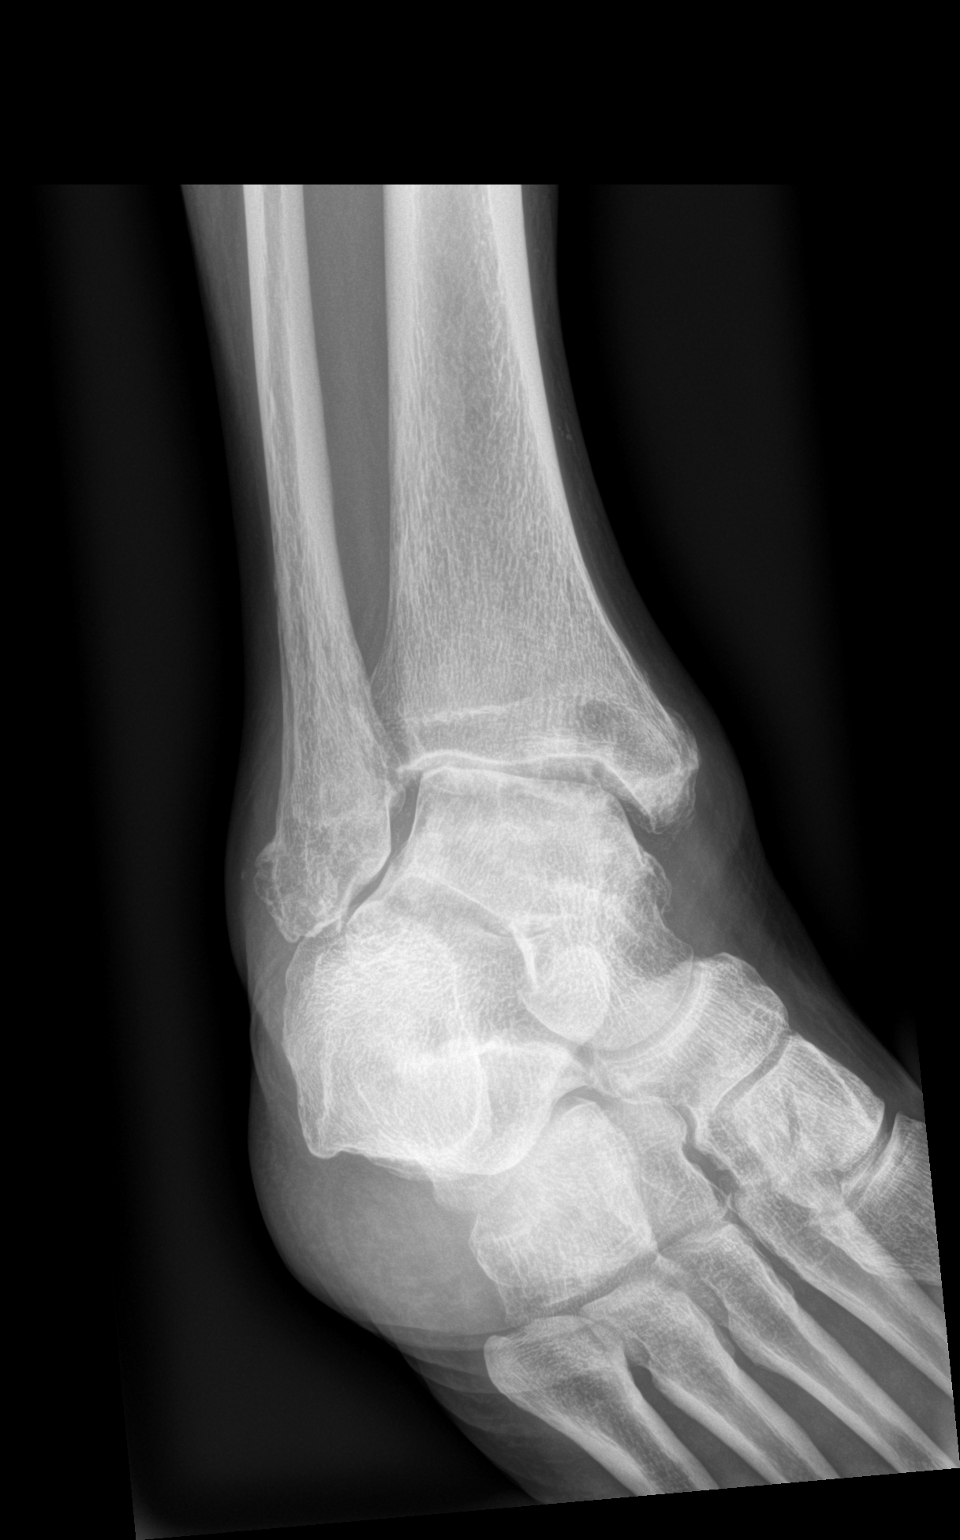

[ankle lat]
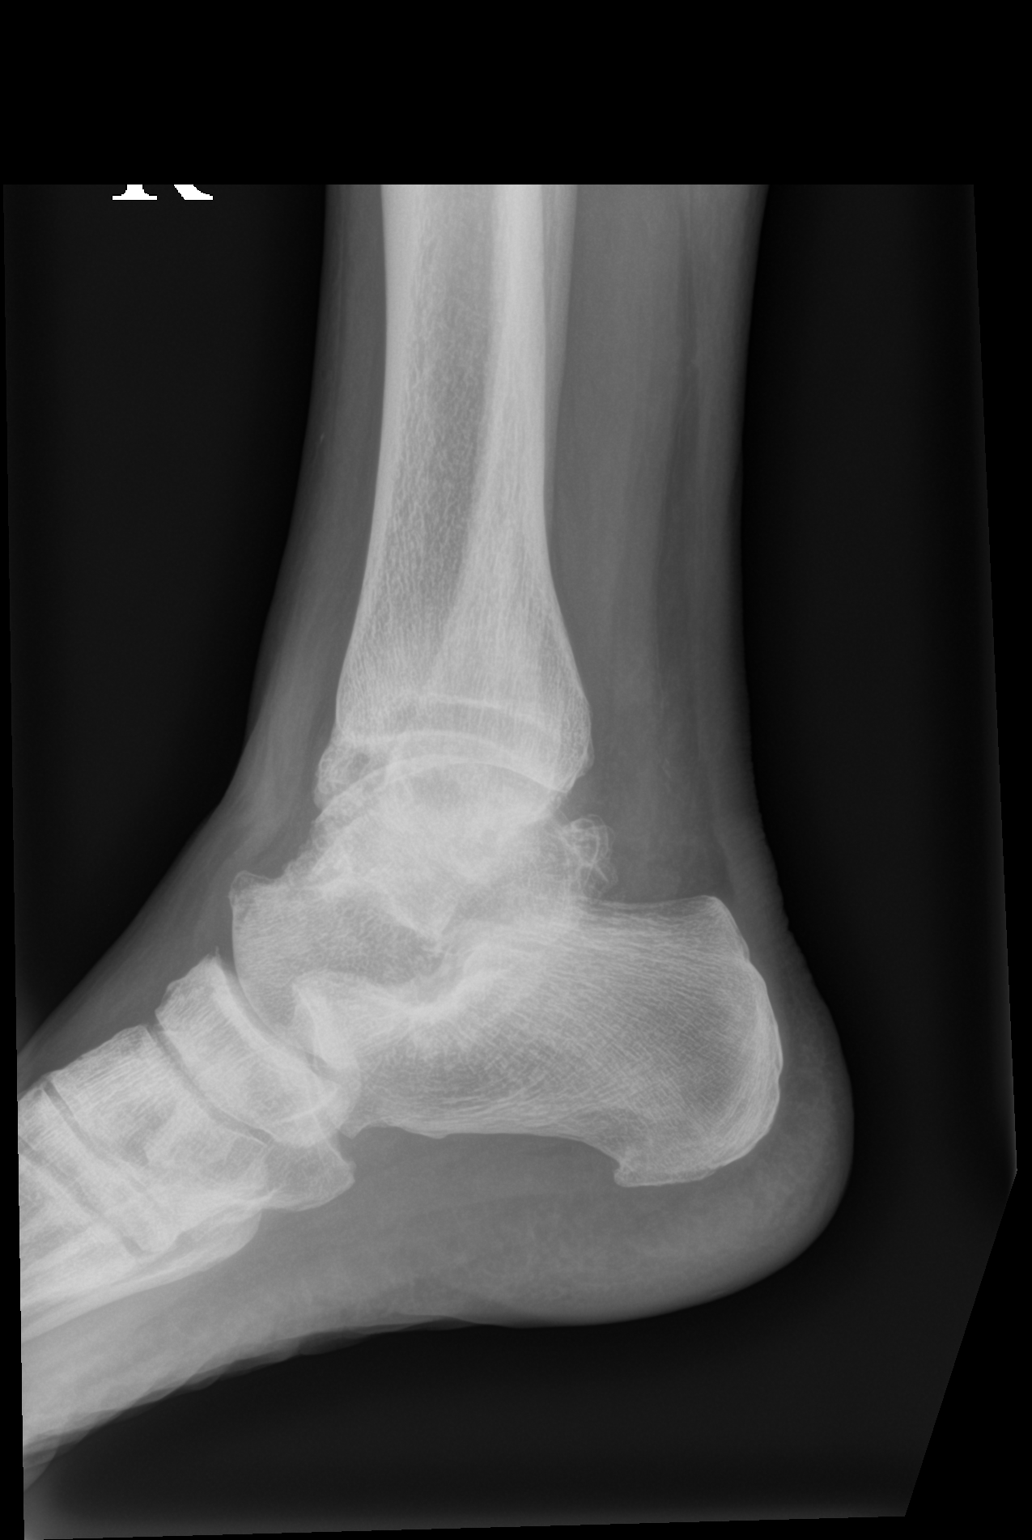

[3 of 3 positions shown; findings below may reference images not displayed]

FINDINGS: No fracture or acute finding.  No bone lesion.

There is asymmetric narrowing of the tibiotalar joint, narrowed
along the lateral margin, with associated lateral talar tilting.
Anterior spurring is seen from the tibial articular surface.

Small plantar calcaneal spur.

Soft tissues are unremarkable.
IMPRESSION: 1. No fracture or acute finding.
2. Degenerative changes of the tibiotalar joint with significant
lateral joint space narrowing. The appearance is stable compared to
the prior study.

## 2023-12-16 IMAGING — MR MR LUMBAR SPINE W/O CM
5 of 6 series · 30 of 48 positions shown · non-contrast
Comparison: Lumbar spine radiographs 06/19/2021

CLINICAL DATA: Lumbar radiculopathy, immunocompromised. Lumbar
spine pain.

EXAM:
MRI LUMBAR SPINE WITHOUT CONTRAST
TECHNIQUE: Multiplanar, multisequence MR imaging of the lumbar spine was
performed. No intravenous contrast was administered.

[Series 3: T2 · sagittal · 4.0mm · 1.09mm/px · 6 of 17 slices shown (1 of 3)]
[im 1/17]
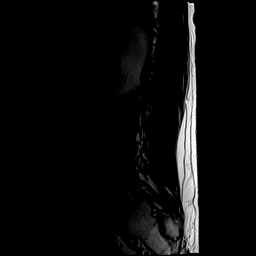
[im 4/17]
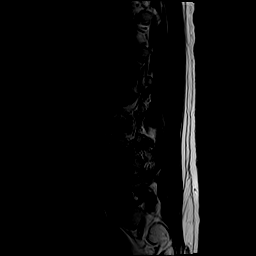
[im 7/17]
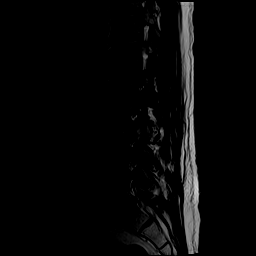
[im 10/17]
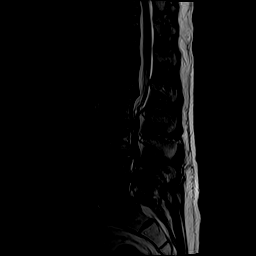
[im 13/17]
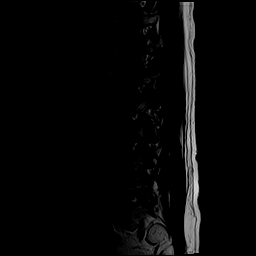
[im 17/17]
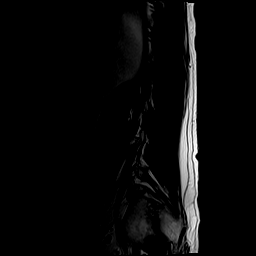

[Series 5: T1 · sagittal · 4.0mm · 1.09mm/px · 5 of 17 slices shown (1 of 2)]
[im 1/17]
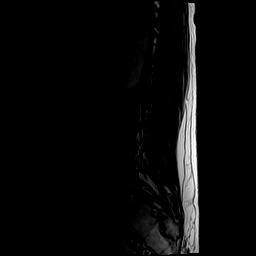
[im 5/17]
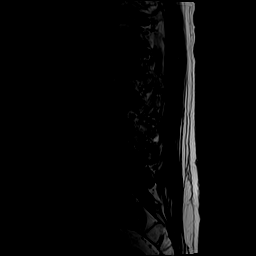
[im 9/17]
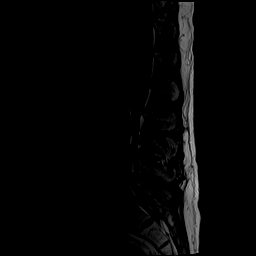
[im 13/17]
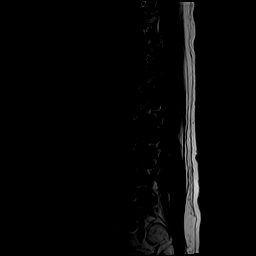
[im 17/17]
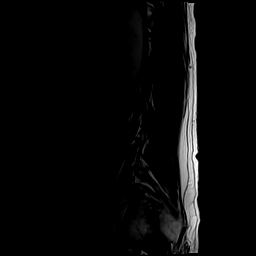

[Series 6: T2 · coronal · 4.0mm · 1.09mm/px · 6 of 20 slices shown (2 of 3)]
[im 1/20]
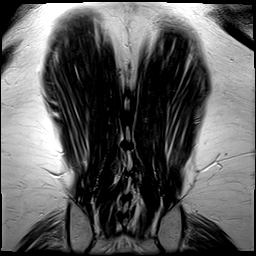
[im 4/20]
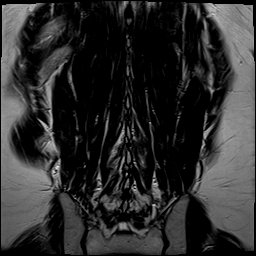
[im 8/20]
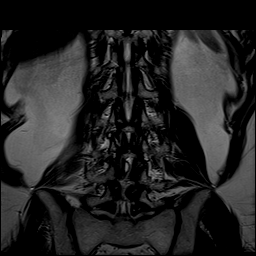
[im 12/20]
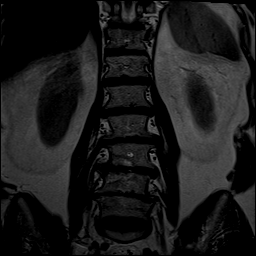
[im 16/20]
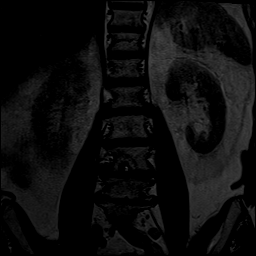
[im 20/20]
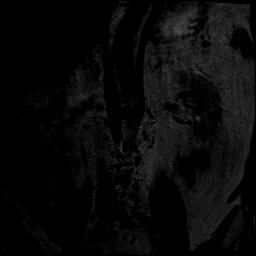

[Series 7: T2 · axial · 4.0mm · 0.39mm/px · z∈[+19,+224]mm · 9 of 40 slices shown (3 of 3)]
[im 1/40]
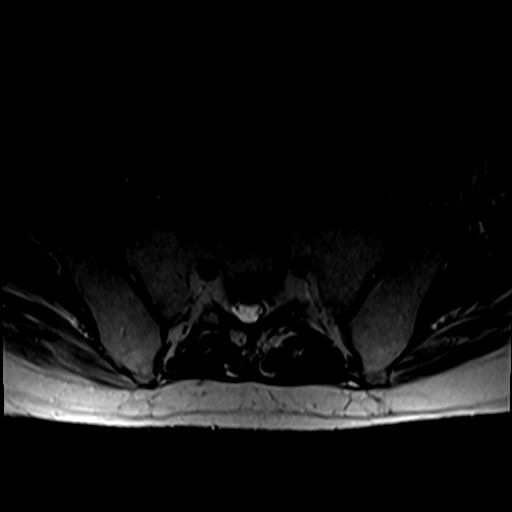
[im 7/40]
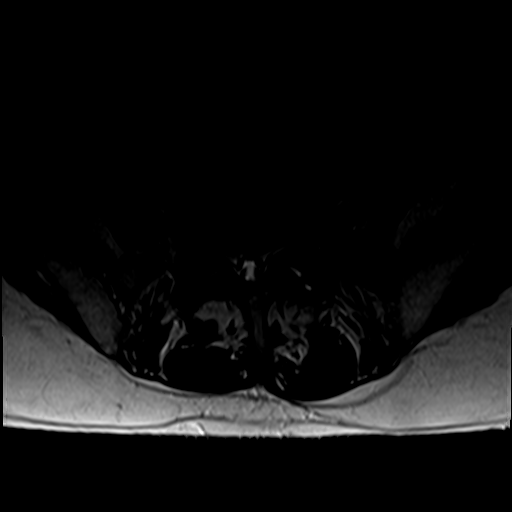
[im 14/40]
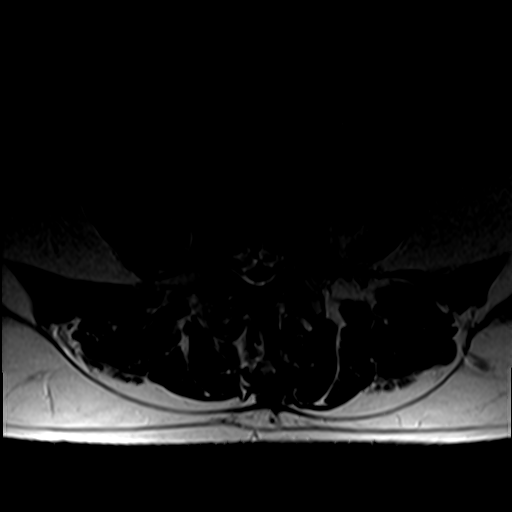
[im 17/40]
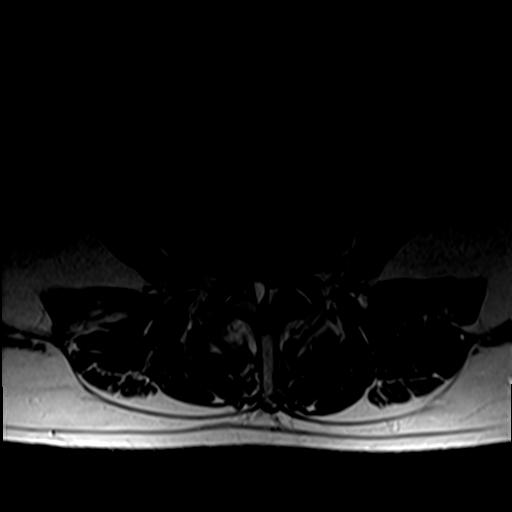
[im 20/40]
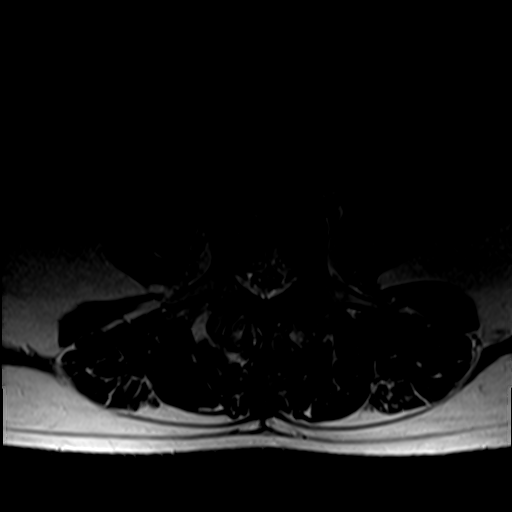
[im 23/40]
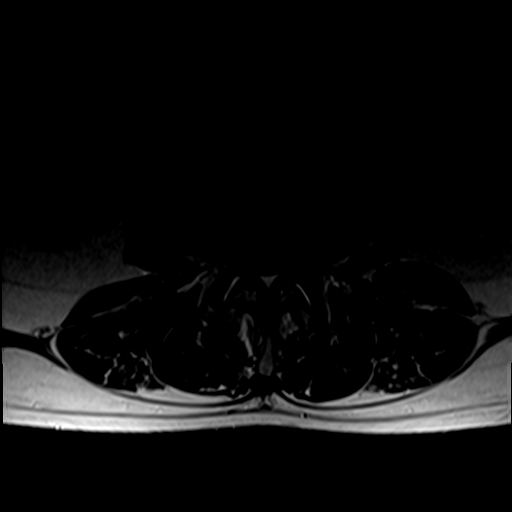
[im 27/40]
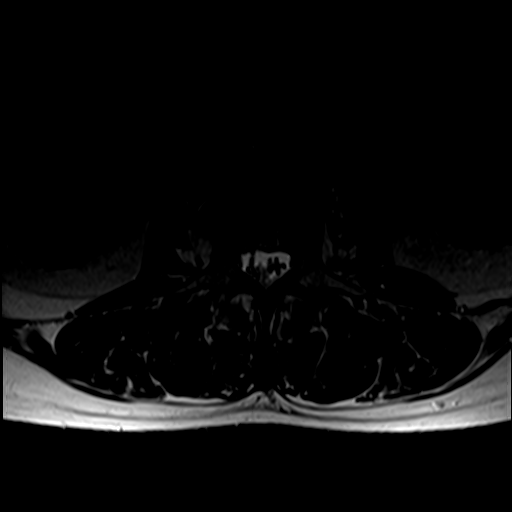
[im 33/40]
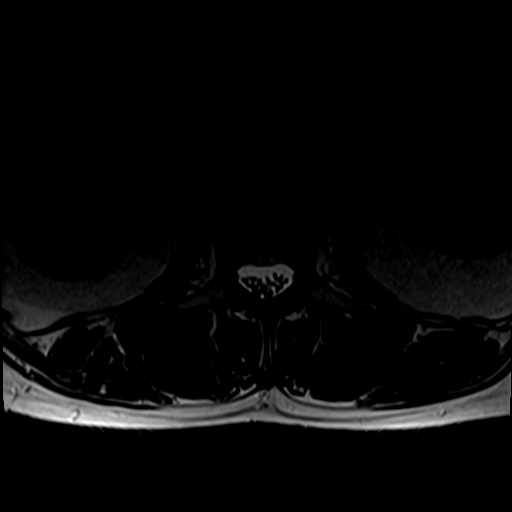
[im 40/40]
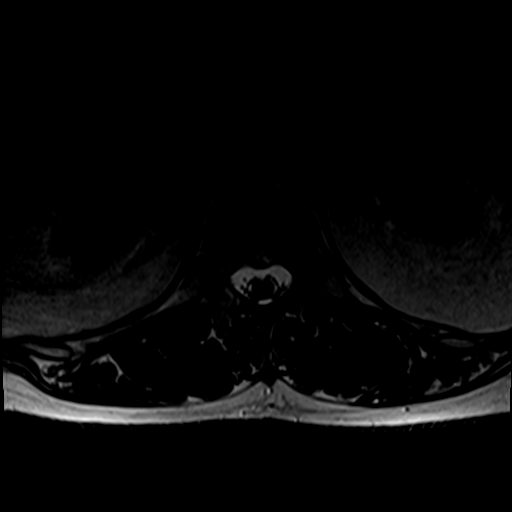

[Series 8: T1 · axial · 4.0mm · 0.39mm/px · z∈[+19,+95]mm · 4 of 40 slices shown (2 of 2)]
[im 1/40]
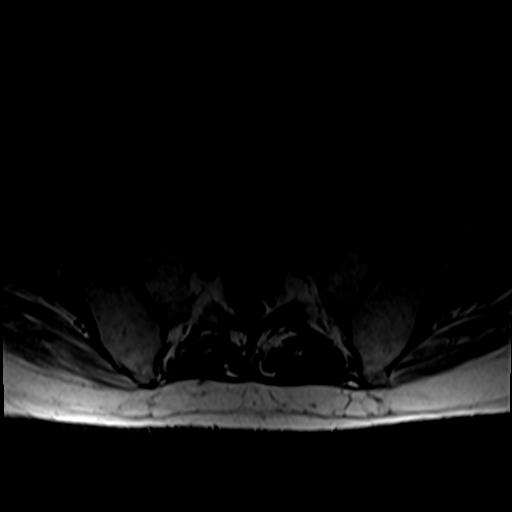
[im 7/40]
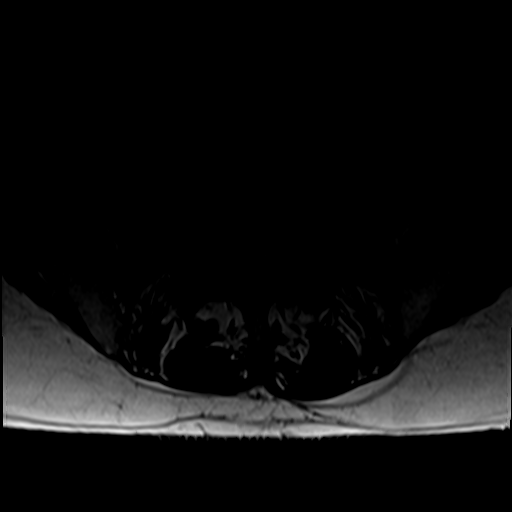
[im 14/40]
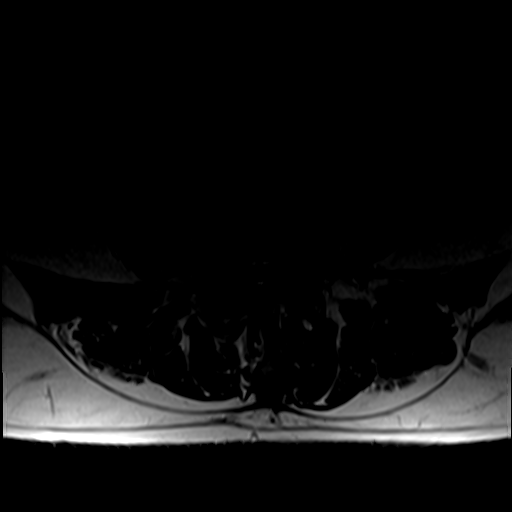
[im 17/40]
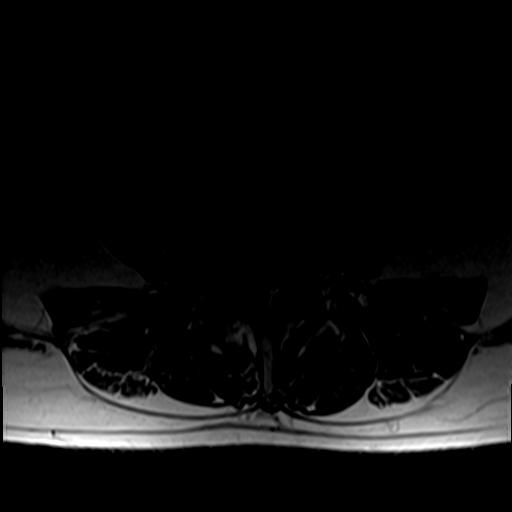

[30 of 48 positions shown; findings below may reference images not displayed]

FINDINGS: Segmentation:  Standard.

Alignment: Mild lumbar levoscoliosis. Mild straightening of the
normal lumbar lordosis. Trace retrolisthesis of L1 on L2, L3 on L4,
and L4 on L5.

Vertebrae: No fracture or suspicious marrow lesion. Moderate
right-sided degenerative endplate edema at L3-4. Chronic
degenerative endplate changes at L4-5.

Conus medullaris and cauda equina: Conus extends to the T12-L1
level. Conus and cauda equina appear normal.

Paraspinal and other soft tissues: Unremarkable.

Disc levels:

Disc desiccation throughout the lumbar spine. Mild disc space
narrowing at L1-2 and L2-3, moderate narrowing at L3-4, and severe
narrowing at L4-5. Diffuse congenital narrowing of the lumbar spinal
canal due to short pedicles.

T12-L1: Shallow central disc protrusion without stenosis.

L1-2: Circumferential disc bulging and mild facet hypertrophy result
in mild-to-moderate bilateral lateral recess stenosis and mild
bilateral neural foraminal stenosis without spinal stenosis.

L2-3: Circumferential disc bulging, prominent dorsal epidural fat,
and moderate to severe facet and ligamentum flavum hypertrophy
result in severe spinal stenosis and moderate right and mild left
neural foraminal stenosis.

L3-4: Circumferential disc bulging, moderate facet and ligamentum
flavum hypertrophy, prominent dorsal epidural fat, and a 7 mm
synovial cyst medial to the right facet joint result in severe
spinal stenosis, asymmetrically severe right lateral recess
stenosis, and moderate to severe right and mild left neural
foraminal stenosis.

L4-5: Circumferential disc bulging and moderate right and severe
left facet and ligamentum flavum hypertrophy result in severe spinal
stenosis, asymmetrically severe left lateral recess stenosis, and
severe left greater than right neural foraminal stenosis.

L5-S1: Disc bulging and moderate facet hypertrophy result in mild
bilateral neural foraminal stenosis without spinal stenosis.
IMPRESSION: 1. Congenitally short pedicles with superimposed advanced disc and
facet degeneration resulting in severe spinal stenosis at L2-3,
L3-4, and L4-5.
2. Multilevel neural foraminal stenosis, severe at L4-5.
